# Patient Record
Sex: Male | Born: 1935 | Race: White | Hispanic: No | Smoking: Former smoker
Health system: Southern US, Community
[De-identification: ages and names within clinical notes are randomized; demographics above are authoritative.]

## PROBLEM LIST (undated history)

## (undated) DIAGNOSIS — I10 Essential (primary) hypertension: Secondary | ICD-10-CM

## (undated) DIAGNOSIS — I2510 Atherosclerotic heart disease of native coronary artery without angina pectoris: Secondary | ICD-10-CM

## (undated) DIAGNOSIS — I779 Disorder of arteries and arterioles, unspecified: Secondary | ICD-10-CM

## (undated) DIAGNOSIS — F1010 Alcohol abuse, uncomplicated: Secondary | ICD-10-CM

## (undated) DIAGNOSIS — I739 Peripheral vascular disease, unspecified: Secondary | ICD-10-CM

## (undated) DIAGNOSIS — K219 Gastro-esophageal reflux disease without esophagitis: Secondary | ICD-10-CM

## (undated) DIAGNOSIS — J189 Pneumonia, unspecified organism: Secondary | ICD-10-CM

## (undated) DIAGNOSIS — E785 Hyperlipidemia, unspecified: Secondary | ICD-10-CM

## (undated) DIAGNOSIS — J449 Chronic obstructive pulmonary disease, unspecified: Secondary | ICD-10-CM

## (undated) DIAGNOSIS — F1011 Alcohol abuse, in remission: Secondary | ICD-10-CM

## (undated) HISTORY — PX: FOOT SURGERY: SHX648

## (undated) HISTORY — DX: Gastro-esophageal reflux disease without esophagitis: K21.9

## (undated) HISTORY — PX: NASAL SINUS SURGERY: SHX719

## (undated) HISTORY — DX: Peripheral vascular disease, unspecified: I73.9

## (undated) HISTORY — DX: Disorder of arteries and arterioles, unspecified: I77.9

## (undated) HISTORY — DX: Alcohol abuse, uncomplicated: F10.10

## (undated) HISTORY — PX: COLONOSCOPY: SHX174

## (undated) HISTORY — DX: Alcohol abuse, in remission: F10.11

---

## 1997-05-19 ENCOUNTER — Ambulatory Visit (HOSPITAL_BASED_OUTPATIENT_CLINIC_OR_DEPARTMENT_OTHER): Admission: RE | Admit: 1997-05-19 | Discharge: 1997-05-19 | Payer: Self-pay | Admitting: Orthopaedic Surgery

## 1998-03-29 ENCOUNTER — Emergency Department (HOSPITAL_COMMUNITY): Admission: EM | Admit: 1998-03-29 | Discharge: 1998-03-29 | Payer: Self-pay | Admitting: Emergency Medicine

## 2000-03-21 ENCOUNTER — Ambulatory Visit (HOSPITAL_COMMUNITY): Admission: RE | Admit: 2000-03-21 | Discharge: 2000-03-21 | Payer: Self-pay | Admitting: Gastroenterology

## 2000-03-21 ENCOUNTER — Encounter: Payer: Self-pay | Admitting: Gastroenterology

## 2000-03-21 ENCOUNTER — Encounter (INDEPENDENT_AMBULATORY_CARE_PROVIDER_SITE_OTHER): Payer: Self-pay | Admitting: Specialist

## 2000-06-29 ENCOUNTER — Encounter: Payer: Self-pay | Admitting: Gastroenterology

## 2000-06-29 ENCOUNTER — Encounter: Admission: RE | Admit: 2000-06-29 | Discharge: 2000-06-29 | Payer: Self-pay | Admitting: Gastroenterology

## 2002-01-08 ENCOUNTER — Encounter: Payer: Self-pay | Admitting: Vascular Surgery

## 2002-01-10 ENCOUNTER — Ambulatory Visit (HOSPITAL_COMMUNITY): Admission: RE | Admit: 2002-01-10 | Discharge: 2002-01-10 | Payer: Self-pay | Admitting: Vascular Surgery

## 2002-01-24 ENCOUNTER — Inpatient Hospital Stay (HOSPITAL_COMMUNITY): Admission: RE | Admit: 2002-01-24 | Discharge: 2002-01-27 | Payer: Self-pay | Admitting: Vascular Surgery

## 2002-02-28 ENCOUNTER — Encounter: Payer: Self-pay | Admitting: Gastroenterology

## 2002-02-28 ENCOUNTER — Encounter: Admission: RE | Admit: 2002-02-28 | Discharge: 2002-02-28 | Payer: Self-pay | Admitting: Gastroenterology

## 2002-11-04 ENCOUNTER — Encounter: Payer: Self-pay | Admitting: Vascular Surgery

## 2002-11-05 ENCOUNTER — Ambulatory Visit (HOSPITAL_COMMUNITY): Admission: RE | Admit: 2002-11-05 | Discharge: 2002-11-05 | Payer: Self-pay | Admitting: Vascular Surgery

## 2002-11-05 HISTORY — PX: FEMORAL-POPLITEAL BYPASS GRAFT: SHX937

## 2003-11-26 ENCOUNTER — Encounter: Admission: RE | Admit: 2003-11-26 | Discharge: 2003-11-26 | Payer: Self-pay | Admitting: Otolaryngology

## 2006-04-16 ENCOUNTER — Encounter: Admission: RE | Admit: 2006-04-16 | Discharge: 2006-04-16 | Payer: Self-pay | Admitting: Otolaryngology

## 2006-04-19 ENCOUNTER — Ambulatory Visit (HOSPITAL_BASED_OUTPATIENT_CLINIC_OR_DEPARTMENT_OTHER): Admission: RE | Admit: 2006-04-19 | Discharge: 2006-04-19 | Payer: Self-pay | Admitting: Otolaryngology

## 2006-04-19 ENCOUNTER — Encounter (INDEPENDENT_AMBULATORY_CARE_PROVIDER_SITE_OTHER): Payer: Self-pay | Admitting: Specialist

## 2006-08-09 ENCOUNTER — Ambulatory Visit: Payer: Self-pay | Admitting: Vascular Surgery

## 2007-03-15 ENCOUNTER — Ambulatory Visit: Payer: Self-pay | Admitting: Vascular Surgery

## 2007-06-12 ENCOUNTER — Ambulatory Visit (HOSPITAL_BASED_OUTPATIENT_CLINIC_OR_DEPARTMENT_OTHER): Admission: RE | Admit: 2007-06-12 | Discharge: 2007-06-12 | Payer: Self-pay | Admitting: Orthopedic Surgery

## 2007-10-30 ENCOUNTER — Ambulatory Visit: Payer: Self-pay | Admitting: Vascular Surgery

## 2008-06-12 ENCOUNTER — Ambulatory Visit: Payer: Self-pay | Admitting: Vascular Surgery

## 2008-06-16 ENCOUNTER — Ambulatory Visit: Payer: Self-pay | Admitting: Surgery

## 2008-06-16 ENCOUNTER — Ambulatory Visit (HOSPITAL_COMMUNITY): Admission: RE | Admit: 2008-06-16 | Discharge: 2008-06-16 | Payer: Self-pay | Admitting: Surgery

## 2009-01-06 HISTORY — PX: NM MYOCAR PERF WALL MOTION: HXRAD629

## 2009-10-26 ENCOUNTER — Inpatient Hospital Stay (HOSPITAL_COMMUNITY): Admission: EM | Admit: 2009-10-26 | Discharge: 2009-10-27 | Payer: Self-pay | Admitting: Emergency Medicine

## 2010-01-06 HISTORY — PX: OTHER SURGICAL HISTORY: SHX169

## 2010-03-29 ENCOUNTER — Ambulatory Visit
Admission: RE | Admit: 2010-03-29 | Discharge: 2010-03-29 | Disposition: A | Discharge status: Home / Self Care | Payer: Medicare Other | Source: Ambulatory Visit | Attending: Allergy and Immunology | Admitting: Allergy and Immunology

## 2010-03-29 ENCOUNTER — Other Ambulatory Visit: Payer: Self-pay | Admitting: Allergy and Immunology

## 2010-03-29 DIAGNOSIS — R0609 Other forms of dyspnea: Secondary | ICD-10-CM

## 2010-04-21 LAB — CBC
HCT: 37.2 % — ABNORMAL LOW (ref 39.0–52.0)
HCT: 41.2 % (ref 39.0–52.0)
Hemoglobin: 12.4 g/dL — ABNORMAL LOW (ref 13.0–17.0)
MCH: 30.6 pg (ref 26.0–34.0)
MCHC: 33.3 g/dL (ref 30.0–36.0)
MCV: 90 fL (ref 78.0–100.0)
Platelets: 232 10*3/uL (ref 150–400)
RBC: 4.05 MIL/uL — ABNORMAL LOW (ref 4.22–5.81)
RBC: 4.58 MIL/uL (ref 4.22–5.81)
RDW: 13.6 % (ref 11.5–15.5)
RDW: 13.9 % (ref 11.5–15.5)
WBC: 11.1 10*3/uL — ABNORMAL HIGH (ref 4.0–10.5)

## 2010-04-21 LAB — BASIC METABOLIC PANEL
BUN: 14 mg/dL (ref 6–23)
Chloride: 98 mEq/L (ref 96–112)
GFR calc Af Amer: >60 mL/min (ref >60)
GFR calc non Af Amer: >60 mL/min (ref >60)
Potassium: 3.7 mEq/L (ref 3.5–5.1)
Sodium: 135 mEq/L (ref 135–145)

## 2010-05-17 LAB — POCT I-STAT, CHEM 8
Calcium, Ion: 1.05 mmol/L — ABNORMAL LOW (ref 1.12–1.32)
Chloride: 102 mEq/L (ref 96–112)
HCT: 42 % (ref 39.0–52.0)
Hemoglobin: 14.3 g/dL (ref 13.0–17.0)
TCO2: 25 mmol/L (ref 0–100)

## 2010-06-21 NOTE — Assessment & Plan Note (Signed)
OFFICE VISIT   Gary [name], Gary [name]  DOB:  01-13-1936                                       10/30/2007  UJWJX#:[id number]   The patient returns today for concern regarding swelling in his right  leg and ankle.  He continues to have pain in both feet.  He reports this  is 24 hours a day and is not related to exercise.  He has had ankle  surgery twice and does have some edema in this area and also reports  some discomfort in his popliteal space.  He has a prior left femoral to  popliteal bypass.  A Doppler evaluation of this reveals patency of his  left fem-pop bypass with no evidence of technical difficulty.  He does  have known calcific tibial disease bilaterally and therefore ankle arm  indices are unreliable.   On physical examination he has a 2+ left popliteal pulse and absent  distal pulses.  His feet are warm and well-perfused bilaterally.  He  does have some mild swelling in his right ankle.  He does not have any  specific tenderness in his right calf.   He underwent right leg venous duplex which showed no evidence of deep  venous thrombosis.  I reassured him that I do not see any evidence of  arterial or venous pathology to explain his chronic bilateral lower  extremity pain.  I explained that he does not have any evidence of DVT  and therefore would not recommend any additional evaluation.  He will  continue to follow up with Dr. Larey Dresser regarding his primary  pathology.   Larina Earthly, M.D.  Electronically Signed   TFE/MEDQ  D:  10/30/2007  T:  10/31/2007  Job:  [id number]   cc:   Ezequiel Kayser. Ajlouny, D.P.M.  Richard A. Alanda Amass, M.D.

## 2010-06-21 NOTE — Procedures (Signed)
BYPASS GRAFT EVALUATION   INDICATION:  Followup lower extremity peripheral vascular disease.   HISTORY:  Diabetes:  No.  Cardiac:  No.  Hypertension:  Yes.  Smoking:  Quit.  Previous Surgery:  Left femoral-popliteal artery bypass graft with  reverse saphenous vein on January 24, 2002.   SINGLE LEVEL ARTERIAL EXAM                               RIGHT              LEFT  Brachial:  Anterior tibial:  Posterior tibial:  Peroneal:  Ankle/brachial index:        Not obtained due to known medial  calcification                Not obtained due to known medial  calcification   PREVIOUS ABI:  Date:  RIGHT:  LEFT:   LOWER EXTREMITY BYPASS GRAFT DUPLEX EXAM:   DUPLEX:  Doppler arterial waveforms appear biphasic proximal to, within  and distal to bypass graft with elevated velocities in the proximal  anastomosis.  As previously noted there are abnormal Doppler signals at  a small pouch which represents a ligated branch of the vein in the  proximal graft.   IMPRESSION:  1. Patent left femoral-popliteal artery bypass graft with elevated      velocities of 334 cm/s in the proximal anastomosis, an increase      from previous study, however correlates with the study before that.  2. ABIs not obtained due to previously documented medial      calcification.   ___________________________________________  Larina Earthly, M.D.   AS/MEDQ  D:  03/15/2007  T:  03/17/2007  Job:  [id number]

## 2010-06-21 NOTE — Assessment & Plan Note (Signed)
OFFICE VISIT   [name], [name]  DOB:  12/02/1935                                       06/12/2008  EAVWU#:[id number]   Gary [name] presents today for continued followup of his left fem-  pop bypass.  This was placed by me in December 2003 for lower extremity  arterial insufficiency and claudication.  [name] has had continued  difficulty with both feet.  He has had multiple surgery on his right  ankle which he reports has helped him none.  This clearly is separate  from any arterial insufficiency, since his pain is 24 hours a day.  He  does have a patent left fem-pop bypass, as he has had serial vascular  lab studies over the course of many years.  He does have tibial vessel  disease with calcification.  Therefore ankle arm indices are not  reliable.  His vascular lab studies have shown progression of the  stenosis in the proximal portion of his femoral to popliteal bypass on  the left.  On physical exam, his feet are well-perfused bilaterally.  He  has 2+ femoral, 2-3+ popliteal pulse on the left.  Today, his velocities  are over 2 centimeters per second near the arterial anastomosis.  This  has been a continual progression.  I discussed this at length with Mr.  Asebedo.  I explained that I do not feel that this is causing any of his  symptoms since his symptoms are constant foot pain and not claudication-  type symptoms.  I did however explain that I feel that this does put his  fem-pop bypass at increased risk with this degree of stenosis.  I have  recommend arteriography for further evaluation and discussed the  treatment options of continued observation versus angioplasty versus  surgical revision depending on the study.  He understands, and we will  proceed with outpatient arteriography with Dr. Myra Gianotti on Jun 16, 2008  at [facility].   Larina Earthly, M.D.  Electronically Signed   TFE/MEDQ  D:  06/12/2008  T:  06/15/2008  Job:   [id number]   cc:   Gerlene Burdock A. Alanda Amass, M.D.

## 2010-06-21 NOTE — Op Note (Signed)
NAME:  Gary [name], Gary [name] NO.:  [id number]   MEDICAL RECORD NO.:  [id number]          PATIENT TYPE:  AMB   LOCATION:  DSC                          FACILITY:  [facility]   PHYSICIAN:  Matthew A. Weingold, M.D.DATE OF BIRTH:  1935/12/24   DATE OF PROCEDURE:  DATE OF DISCHARGE:                               OPERATIVE REPORT   PREOPERATIVE DIAGNOSIS:  Right hand index, long ring, and small finger  stenosing tenosynovitis.   PREOPERATIVE DIAGNOSIS:  Right hand index, long ring, and small finger  stenosing tenosynovitis.   PROCEDURE:  Release of right long and right ring A1 pulleys and  injection in the right index and right small A1 pulleys with Celestone 1  mL each A1 pulley.   SURGEON:  Artist Pais. Mina Marble, MD   ASSISTANT:  None.   ANESTHESIA:  Regional.   TOURNIQUET TIME:  26 minutes.   COMPLICATIONS:  None.   DRAINS:  None.   OPERATIVE REPORT:  The patient was taken to the operating suite.  After  induction of adequate regional anesthetic, right upper extremity was  prepped and draped in sterile fashion.  An Esmarch was used.  After  adequate regional anesthetic was obtained, again it was prepped and  draped in sterile fashion.  At this point in time, the A1 pulley area of  the index and small finger were injected with 1 mL Celestone in the each  A1 pulley.  This was followed by a transverse incision was made over the  long and ring finger of A1 pulleys.  Dissection was carried down through  skin, subcutaneous tissues.  Neurovascular bundle was carefully  identified and retracted.  The proximal edge of the A1 pulley was  identified and split.  The FDS and FDP tendons were lysed.  All  adhesions and wound was irrigated and this was closed with 4-0 Vicryl  Rapide sutures.  Xeroform, 4x4s, and compressive wrap was applied.  The  patient tolerated the procedures well and to recovery room in stable  fashion.      Artist Pais Mina Marble, M.D.  Electronically  Signed     MAW/MEDQ  D:  06/12/2007  T:  06/13/2007  Job:  [id number]

## 2010-06-21 NOTE — Procedures (Signed)
BYPASS GRAFT EVALUATION   INDICATION:  Follow-up evaluation of lower extremity bypass graft.   HISTORY:  Diabetes:  No.  Cardiac:  No.  Hypertension:  Yes.  Smoking:  Quit.  Previous Surgery:  Left fem-pop bypass graft with reverse saphenous vein  on 01/24/02.   SINGLE LEVEL ARTERIAL EXAM                               RIGHT              LEFT  Brachial:                    191                200  Anterior tibial:             193                177  Posterior tibial:            Noncompressible    Noncompressible  Peroneal:  Ankle/brachial index:        0.97               0.89   PREVIOUS ABI:  Date:  RIGHT:  LEFT:   LOWER EXTREMITY BYPASS GRAFT DUPLEX EXAM:   DUPLEX:  1. Increased velocity of 442 cm/s noted at the proximal anastomosis      and 219 cm/s at the inflow artery.  2. Biphasic duplex waveforms noted within the graft in the native      arteries.   IMPRESSION:  1. Normal right lower extremity ankle brachial indices with biphasic      Doppler waveforms.  2. Left lower extremity ankle brachial indices suggest mild arterial      disease with biphasic duplex waveforms.   ___________________________________________  Larina Earthly, M.D.   AC/MEDQ  D:  06/12/2008  T:  06/12/2008  Job:  [id number]

## 2010-06-21 NOTE — Procedures (Signed)
DUPLEX DEEP VENOUS EXAM - LOWER EXTREMITY   INDICATION:  Persistent right foot swelling and pain.   HISTORY:  Edema:  Right ankle and foot swelling.  Trauma/Surgery:  Left fem-pop bypass graft in 2003.  Pain:  Both feet and occasional sharp pain behind his right knee.  PE:  No.  Previous DVT:  No.  Anticoagulants:  Other:   DUPLEX EXAM:                CFV   SFV   PopV  PTV    GSV                R  L  R  L  R  L  R   L  R  L  Thrombosis    o  o  o     o     o      o  Spontaneous   +  +  +     +     +      +  Phasic        +  +  +     +     +      +  Augmentation  +  +  +     +     +      +  Compressible  +  +  +     +     +      +  Competent   Legend:  + - yes  o - no  p - partial  D - decreased   IMPRESSION:  No evidence of deep vein thrombosis noted in the right  lower extremity.    _____________________________  Larina Earthly, M.D.   CH/MEDQ  D:  10/30/2007  T:  10/30/2007  Job:  [id number]

## 2010-06-21 NOTE — Procedures (Signed)
BYPASS GRAFT EVALUATION   INDICATION:  Follow up lower extremity peripheral vascular disease.   HISTORY:  Diabetes:  No  Cardiac:  No  Hypertension:  Yes  Smoking:  Quit  Previous Surgery:  Left fem-pop bypass with reverse saphenous vein on  January 24, 2002   SINGLE LEVEL ARTERIAL EXAM                               RIGHT              LEFT  Brachial:  Anterior tibial:  Posterior tibial:  Peroneal:  Ankle/brachial index:        Not obtained due to known medial  calcification.               Not obtained due to known medial  calcification.   PREVIOUS ABI:  Date:  RIGHT:  LEFT:   LOWER EXTREMITY BYPASS GRAFT DUPLEX EXAM:   DUPLEX:  Arterial Doppler wave forms are biphasic proximal to within and  distal to bypass graft with elevated velocities in the left CFA and  proximal anastomosis.  Elevated velocities appear fairly stable with  previous studies.  As previously noted, there are abnormal Doppler  signals at a small pouch which represents a ligated branch of the vein  in the proximal graft.   See attached sheet for velocities.   IMPRESSION:  1. Patent left femoral to popliteal bypass graft with elevated      velocities proximally.  2. No significant changes from previous studies.   ___________________________________________  Larina Earthly, M.D.   AS/MEDQ  D:  08/09/2006  T:  08/09/2006  Job:  [id number]

## 2010-06-21 NOTE — Assessment & Plan Note (Signed)
OFFICE VISIT   Gary [name], Gary [name]  DOB:  10-Feb-1935                                       03/15/2007  ZOXWR#:[id number]   The patient presents today for continued followup of his lower extremity  arterial insufficiency.  He is status post left femoral to below knee  popliteal bypass in 2003.  He continues to have discomfort in both lower  extremities.  He reports burning sensation in both feet, which is  chronic.  He did report that 1 month ago, he had progressive pain in his  right foot.  He reports that this felt similar to what he had prior to  his prior bypass.  This resolved over several weeks.  He currently does  have discomfort with prolonged walking, but it does appear to be mainly  neurogenic, seeming more like a peripheral neuropathy than claudication.  He reports occasional nitroglycerin use for chest pain.   PHYSICAL EXAM:  Well-developed, well-nourished white male appearing  stated age of 43.  His blood pressure is 166/66, pulse 59, respirations  18, oxygen saturation 99% on room air.  His radial pulses are 2+.  He  has 2+ femoral pulses bilaterally.  He does have an easily palpable  popliteal graft pulse on the left and no palpable pulses on the right.  I do not palpate pedal pulses on the left or right.   He underwent noninvasive vascular laboratory studies in our office  today, and this revealed widely patent left fem-pop bypass.  He does  have severe calcified tibial disease, making his ankle/arm index unable  to be obtained.  I discussed this with the patient.  I did not see any  evidence of limb-threatening ischemia.  Currently, he does not appear to  be limited by claudication, and has more limitation due to neurogenic-  type pain.  He will continue his walking program as much as possible,  and return to see [place] in the vascular lab for protocol followup.   Larina Earthly, M.D.  Electronically Signed   TFE/MEDQ  D:  03/15/2007  T:   03/18/2007  Job:  [id number]   cc:   Richard A. Alanda Amass, M.D.

## 2010-06-21 NOTE — Procedures (Signed)
BYPASS GRAFT EVALUATION   INDICATION:  Follow up left lower extremity bypass graft.   HISTORY:  Diabetes:  No.  Cardiac:  No.  Hypertension:  Yes.  Smoking:  Quit.  Previous Surgery:  Right fem-pop bypass graft on 01/24/02.   SINGLE LEVEL ARTERIAL EXAM                               RIGHT              LEFT  Brachial:  Anterior tibial:  Posterior tibial:  Peroneal:  Ankle/brachial index:        Not done           Not done   PREVIOUS ABI:  Date:  RIGHT:  LEFT:   LOWER EXTREMITY BYPASS GRAFT DUPLEX EXAM:   DUPLEX:  Biphasic Doppler waveforms noted throughout the left lower  extremity bypass graft and the native vessels with increased velocity of  353 cm/s noted at the proximal anastomosis.  As previously recorded,  there is an abnormal Doppler signal at the level of a small pouch,  which represents a ligated branch of the vein used for  the proximal  graft.   IMPRESSION:  1. Patent left femoropopliteal bypass graft with an increased velocity      and abnormal waveforms noted, as described above.  2. Bilateral ankle brachial indices were not performed due to known      medical calcification.  3. No significant change noted when compared to the previous      examination on 03/15/07.   ___________________________________________  Larina Earthly, M.D.   CH/MEDQ  D:  10/30/2007  T:  10/30/2007  Job:  [id number]

## 2010-06-21 NOTE — Op Note (Signed)
NAME:  Gary [name], Gary [name] NO.:  [id number]   MEDICAL RECORD NO.:  [id number]          PATIENT TYPE:  AMB   LOCATION:  SDS                          FACILITY:  [facility]   PHYSICIAN:  VDurene Cal IV, MDDATE OF BIRTH:  05-Nov-1935   DATE OF PROCEDURE:  06/16/2008  DATE OF DISCHARGE:                               OPERATIVE REPORT   PREOPERATIVE DIAGNOSIS:  Left femoral-popliteal bypass graft stenosis.   POSTOPERATIVE DIAGNOSIS:  Left femoral-popliteal bypass graft stenosis.   PROCEDURES PERFORMED:  1. Ultrasound access right femoral artery.  2. Abdominal aortogram.  3. Third-order catheterization.  4. Bilateral lower extremity runoff.   INDICATIONS:  This is a 75 year old gentleman who is status post left  femoral-popliteal bypass graft with vein in 2003.  He has developed  progressive  increase in velocities of proximal anastomosis.  He comes  in today for evaluation and possible intervention.   PROCEDURE:  The patient was identified in the holding area and taken to  room 8, he was placed supine on the table.  Right groin was prepped and  draped in a standard sterile fashion.  A time-out was called.  Right  femoral artery was evaluated with ultrasound, found to be widely patent.  Lidocaine 1% was used for local anesthesia.  The right femoral artery  was accessed under ultrasound guidance with an 18-gauge needle.  Benson  wire was advanced into the aorta under fluoroscopic visualization.  A 5-  French sheath was placed.  Omni flush catheter was placed at L1,  abdominal aortogram was obtained.  Catheter was pulled down to the  aortic bifurcation.  Pelvic angiogram was obtained.  Aortic bifurcation  was crossed with an Omni flush catheter.  A Glidewire and a 4-French  hand-held catheter and left leg runoff was obtained.  A Glidewire and  the hand-held catheter were used to gain access into the left femoral-  popliteal bypass graft and pullback pressures were obtained.   Next,  contrast injections were performed through the sheath for right leg  runoff.   FINDINGS:  Aortogram:  The visualized portions of the suprarenal  abdominal aorta are widely patent.  There are single renal arteries  bilaterally, which are widely patent.  The infrarenal abdominal aorta is  without significant disease.   Pelvic angiogram:  Bilateral common iliac arteries are patent.  There is  area of luminal narrowing in the distal right common iliac artery.  Right external iliac artery is patent without significant disease.  Right hypogastric artery is patent.  The left common iliac artery is  patent throughout its course.  The left external iliac artery is widely  patent.  The left hypogastric artery is widely patent.   Left lower extremity:  The left femoral artery is widely patent.  Bypass  graft is seen originating from the distal common femoral artery.  There  is luminal narrowing in the proximal portion of the bypass graft  approximately 40%.  This is not hemodynamically significant.  Pullback  pressures were performed across the anastomosis, which did not reveal a  pressure gradient change.  The bypass graft distal  anastomosis is into  the distal below-knee popliteal artery at the takeoff of an anterior  tibial artery.  The patient has 3-vessel runoff which are diffusely  diseased.   Right lower extremity:  The right common femoral artery was widely  patent.  There is diffuse disease throughout the right superficial  femoral artery with multiple areas of stenosis.  The above-knee  popliteal artery is also diffusely diseased.  The popliteal artery is  patent throughout its course.  He has 3-vessel runoff, although the  tibial vessels are diffusely diseased.  The posterior tibial artery has  multiple areas of high-grade stenosis.   After the above images were obtained, decision was made to terminate the  procedure.  Catheters and wires were removed.  The patient was  taken to  the holding area for sheath pull.   IMPRESSION:  1. Stenosis in the distal right common iliac artery.  2. Widely patent left femoral below-knee popliteal bypass graft.  The      luminal narrowing in the proximal anastomosis was not felt to be      hemodynamically significant based on multiple oblique contrast      injections as well as pullback pressures.      Jorge Ny, MD  Electronically Signed     VWB/MEDQ  D:  06/16/2008  T:  06/17/2008  Job:  [id number]

## 2010-06-24 NOTE — Discharge Summary (Signed)
   NAME:  Gary [name], Gary [name]                       ACCOUNT NO.:  [id number]   MEDICAL RECORD NO.:  [id number]                   PATIENT TYPE:  INP   LOCATION:  2024                                 FACILITY:  [facility]   PHYSICIAN:  Larina Earthly, M.D.                 DATE OF BIRTH:  24-Sep-1935   DATE OF ADMISSION:  01/24/2002  DATE OF DISCHARGE:  01/27/2002                                 DISCHARGE SUMMARY   ADMISSION DIAGNOSIS:  Peripheral vascular disease.   DISCHARGE DIAGNOSIS:  Peripheral vascular disease.   HOSPITAL COURSE:  The patient was admitted to [facility]  on January 24, 2002 after being seen and evaluated by Dr. Arbie Cookey as an  outpatient secondary to peripheral vascular disease.  On the date of  admission Dr. Arbie Cookey performed a left femoral popliteal bypass graft.  No  complications were noted during the procedure.  Postoperatively the  patient's vascular graft remained patent throughout the entire postoperative  course.  His hemoglobin and hematocrit remained stable at 11.6 and 33.7.  The patient began ambulating well and was subsequently deemed stable for  discharge home on postoperative day #3, January 27, 2002.   DISCHARGE MEDICATIONS:  1. Lipitor 80 mg daily.  2. Nexium 40 mg daily.  3. Avandia 12.5 mg daily.  4. Zetia 10 mg daily.  5. Neurontin 300 mg two tablets daily.  6. Tylox one to two tablets q.4-6h. PRN pain.   ACTIVITY:  The patient was told no driving, strenuous activity, lifting of  heavy objects.  The patient is told to walk daily, continue his breathing  exercises.   DIET:  Low fat, low salt.   WOUND CARE:  Patient was told he could shower and clean his incision with  soap and water.  He also will be called to notify him of his appointment for  stable removal.   DISPOSITION:  Home.   FOLLOW UP:  The patient will see Dr. Arbie Cookey at the [facility] office in  approximately two weeks in follow up. He also will call to verify the time  and  date of this appointment.     Levin Erp. Steward, P.A.                      Larina Earthly, M.D.    BGS/MEDQ  D:  01/27/2002  T:  01/27/2002  Job:  [id number]

## 2010-06-24 NOTE — H&P (Signed)
NAME:  TEEGAN, BRANDIS NO.:  [id number]   MEDICAL RECORD NO.:  [id number]          PATIENT TYPE:  AMB   LOCATION:  DSC                          FACILITY:  [facility]   PHYSICIAN:  Hermelinda Medicus, M.D.   DATE OF BIRTH:  1935-03-09   DATE OF ADMISSION:  04/19/2006  DATE OF DISCHARGE:                              HISTORY & PHYSICAL   This patient is a 75 year old male who has had a tympanic membrane  perforation for several years.  He has put this off because he has had  other issues both pulmonary and heart issues and peripheral vascular  issues but these have now been cleared and he now enters for a left  tympanoplasty in an effort to gain or regain his hearing at a little  better level, where, at this point his hearing is down in that left ear  he has 55 dB speech reception threshold, some of which is sensory neural  in the upper frequencies but in the lower frequencies is definitely  conductive.  He has sensory neural status is quite stable in the lower  frequencies.  He has an 83 and 82% discrimination score.   PAST HISTORY:  1. Is that of a femoral popliteal bypass in 2004.  2. Peripheral vascular a surgery in 2003.  3. Sinus surgery and T&A in the past.  4. Bilateral inguinal hernia repairs in the past.  5. Colonoscopy in 2002.  6. Left and right foot surgery also.  Dr. Alanda Amass takes care of him      now.  7. Hypertension.  8. Peripheral vascular disease and has been cleared by Dr. Alanda Amass      just this last month in February.   His further past history is that of reflux esophagitis, nocturia .  Otherwise, he is in excellent condition.   His cardiac workup is completely here in the chart showing a lipid  panel, which shows a cholesterol of 213.   MEDICATIONS:  1. Aspirin 81.  2. Caduet 10 mg 80 mg daily.  3. Diovan 25/160 p.o. daily.  4. Metoprolol tartrate 50 1/2 tablet b.i.d.  5. Prilosec 20 mg per day.  6. Zetia 10 mg per day.   He does  definitely have hypertensive issues and this has been completely  evaluated. His blood pressure was 125/75, 140/70 has a 2/6 systolic  murmur.  His post rest ejection fraction is 87%.  His ED volume is 76 mL  and he has been cleared for surgery by Dr. Franchot Heidelberg.   PHYSICAL EXAMINATION:  GENERAL:  Reveals a blood pressure 181/74 with a  pulse of 58.  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 His left ear has  perforation that involves the malleus and the anterior canal wall by the  annulus and, however, is free of cholesteatoma.  His middle ear looks to  be quite excellent after prepping and cleansing  in the office.  His  oral  cavity is clear.  His neck is free of any thyromegaly, cervical  adenopathy or mass.  His larynx is clear, true cords, false cords,  epiglottis, base of tongue are clear,  true cord mobility, gag reflex,  good EOMs, facial nerve are all symmetrical.  CHEST:  Free of rales, rhonchi or wheezes.  No __________, but he does  have a murmur 2/6 as above described.  Chest x-ray shows no acute  findings.  EKG showed normal sinus rhythm.  ABDOMEN:  Unremarkable.  EXTREMITIES:  Unremarkable except the above surgeries noted.   INITIAL DIAGNOSIS:  1. Left tympanic membrane perforation.  2. History of elevated cholesterol.  3. History of blood pressure elevation.  4. History of sensory neural hearing loss.  5. Triglyceride elevation.  6. Arteriosclerosis.  7. Peripheral vascular disease.  8. Chest pain.  9. Hyperlipidemia.           ______________________________  Hermelinda Medicus, M.D.     JC/MEDQ  D:  04/19/2006  T:  04/20/2006  Job:  [id number]   cc:   Gerlene Burdock A. Alanda Amass, M.D.  [facility] [employer]

## 2010-06-24 NOTE — Op Note (Signed)
NAME:  Gary [name], Gary [name]                       ACCOUNT NO.:  [id number]   MEDICAL RECORD NO.:  [id number]                   PATIENT TYPE:  INP   LOCATION:  3305                                 FACILITY:  [facility]   PHYSICIAN:  Larina Earthly, M.D.                 DATE OF BIRTH:  04-09-1935   DATE OF PROCEDURE:  01/24/2002  DATE OF DISCHARGE:                                 OPERATIVE REPORT   PREOPERATIVE DIAGNOSES:  Left lower extremity arterial insufficiency and  claudication.   POSTOPERATIVE DIAGNOSES:  Left lower extremity arterial insufficiency and  claudication.   PROCEDURE:  Left femoral to below knee popliteal bypass with reverse  saphenous vein.   SURGEON:  Larina Earthly, M.D.   ASSISTANT:  Quita Skye. Hart Rochester, M.D., Eber Hong, P.A.-C.   ANESTHESIA:  General endotracheal.   COMPLICATIONS:  None.   DISPOSITION:  To recovery room stable.   PROCEDURE IN DETAIL:  The patient was taken to the operating room and placed  in a supine position where the area of the left groin and left leg were  prepped and draped in the usual sterile fashion.  An incision was made over  the femoral pulse and carried down to isolate the common, superficial,  femoral, and profundus femoris arteries.  The patient had a pulse at this  level with extensive calcification of the artery.  The saphenous vein was  identified at the saphenofemoral junction.  The tributary branches were  ligated with 3-0 and 4-0 silk ties and divided.  The patient had a normal-  caliber vein for approximately the first 4-5 cm, and then this split early  in the proximal thigh.  The patient had a duplicated system from the thigh  down to the below-knee position.  The larger of the two veins was chosen for  bypass.  The below-knee popliteal artery was exposed and was again was  extensively calcified but was of good caliber.  A tunnel was created from  the below-knee popliteal artery to the groin.  The saphenous vein  was  harvested from the level of the midcalf up to the groin.  The saphenofemoral  junction was occluded with a Cooley clamp, and the saphenous vein was  excised at the saphenofemoral junction, and this was oversewn with 5-0  Prolene suture.  The vein was gently dilated and was of small to moderate  size.  Due to the small size, the decision was made to reverse the vein  rather than attempt valve lysis.  The patient was given 7000 units of  intravenous heparin.  After adequate circulation time, the common  superficial, femoral and profunda femoris arteries were occluded.  The  common femoral artery was opened with an 11 blade and extended with Pott  scissors.  The saphenous vein was reversed, spatulated, and sewn end-to-side  to the artery with a running 6-0 Prolene suture.  The anastomosis  was tested  and found to be adequate.  Next, the vein was tunneled down to the level of  the below-knee popliteal artery.  A pneumatic tourniquet was placed at the  above-knee position.  The leg was elevated and exsanguinated with an Esmarch  tourniquet, and the pneumatic tourniquet was inflated to 250 mmHg.  The  below-knee popliteal artery was opened with a 11 blade and extended with  Pott scissors.  The vein was kept the appropriate length, was spatulated,  and sewn end-to-side to the artery with a running 6-0 Prolene suture.  Prior  to completion of the anastomosis, the tourniquet was deflated, and the usual  flushing maneuvers were undertaken.  The anastomosis was completed, and  graft-dependent Doppler flow was noted in the foot.  The patient was given  50 mg of Protamine to reverse heparin.  The wounds were irrigated with saline and hemostasis with electrocautery.  The wounds were closed with 2-0 and 3-0 Vicryl in the subcutaneous tissue.  The skin was closed with skin clips.  A sterile dressing was applied, and  the patient was taken to the recovery room in stable condition.                                                Larina Earthly, M.D.    TFE/MEDQ  D:  01/24/2002  T:  01/25/2002  Job:  [id number]

## 2010-06-24 NOTE — Op Note (Signed)
NAME:  Gary [name], Gary [name]                       ACCOUNT NO.:  [id number]   MEDICAL RECORD NO.:  [id number]                   PATIENT TYPE:  OIB   LOCATION:  2895                                 FACILITY:  [facility]   PHYSICIAN:  Quita Skye. Hart Rochester, M.D.               DATE OF BIRTH:  05/21/1935   DATE OF PROCEDURE:  11/05/2002  DATE OF DISCHARGE:                                 OPERATIVE REPORT   PREOPERATIVE DIAGNOSIS:  Left femoral popliteal vein graft stenosis.   POSTOPERATIVE DIAGNOSIS:  Left femoral popliteal vein graft stenosis.   PROCEDURE:  1. Abdominal aortogram with bilateral lower extremity run off via right     common femoral approach.  2. Selective catheterization left common iliac artery with left femoral     angiogram.   SURGEON:  Quita Skye. Hart Rochester, M.D.   ANESTHESIA:  Local Xylocaine.   COMPLICATIONS:  None.   DESCRIPTION OF PROCEDURE:  The patient was taken to the [facility]  peripheral endovascular lab and placed in supine position at which time both  groins were prepped with Betadine scrubbing solution and draped in routine  sterile manner.  After infiltration with 1% Xylocaine, the right common  femoral artery was entered percutaneously, a guide-wire passed into the  suprarenal aorta under fluoroscopic guidance.  A 5 French sheath and dilator  were passed over the guide-wire, dilator removed, and standard pigtail  positioned in the suprarenal aorta.  Flush abdominal aortogram was performed  injecting 20 mL of contrast at 20 mL per second.  This revealed the aorta to  be widely patent with single renal arteries bilaterally with no stenosis.  There was minimal atherosclerotic changes in the infrarenal aorta with no  stenosis and a small inferior mesenteric artery was patent.  Both common,  internal, and external iliac arteries were widely patent.  The catheter was  withdrawn into the terminal aorta and bilateral lower extremity run off  performed injecting 88 mL of  contrast at 8 mL per second.  This revealed the  right iliac system and right common femoral artery to be widely patent.  There was mild disease in the right superficial femoral artery with no  significant stenosis and the right popliteal artery was patent across the  knee joint.  There was three vessel run off on the right with the best  vessel being the anterior tibial and the peroneal, the posterior tibial  being diffusely diseased and all three vessels being diseased distally.  On  the left side, there was a widely patent iliofemoral system and a left  femoral popliteal vein graft which had a proximal stenosis over the proximal  2-3 cm  which was better visualized using the lateral and oblique views  performed later.  The left profunda femoris artery was widely patent.  The  vein graft was anastomosed to the below knee popliteal artery as there was a  chronic superficial femoral occlusion.  There was three vessel run off on  the left with the best vessel being the posterior tibial and the peroneal  arteries, the anterior tibial artery being the smallest and all being  distally diseased.  The pigtail catheters were removed over the guide-wire  and an IMA catheter used to selectively catheterize the left common iliac  artery and additional views of the left femoral popliteal vein graft at its  origin were obtained injecting 10 mL of contrast at 5 mL per second using  both RAO, LAO and lateral views.  This better defined the moderate stenosis  which appeared to be approximately 70-75% at the origin of the left femoral  popliteal vein graft.  The IMA catheter was then removed over the guide-  wire, the sheath removed, adequate compression applied, no complications  ensued.    FINDINGS:  1. Widely patent aortoiliac system.  2. Patent left femoral popliteal vein graft (below the knee) with proximal     vein graft stenosis approximating 75%.  3. Three vessel run off on the left with  diffuse disease distally.  4. Mild superficial femoral occlusive disease on the right with three vessel     run off with diseased tibial vessels.                                               Quita Skye Hart Rochester, M.D.    JDL/MEDQ  D:  11/05/2002  T:  11/05/2002  Job:  [id number]

## 2010-06-24 NOTE — H&P (Signed)
NAME:  Gary [name], Gary [name]                       ACCOUNT NO.:  [id number]   MEDICAL RECORD NO.:  [id number]                   PATIENT TYPE:  INP   LOCATION:  NA                                   FACILITY:  [facility]   PHYSICIAN:  Larina Earthly, M.D.                 DATE OF BIRTH:  February 11, 1935   DATE OF ADMISSION:  01/24/2002  DATE OF DISCHARGE:                                HISTORY & PHYSICAL   CHIEF COMPLAINT:  Peripheral vascular disease.   HISTORY OF PRESENT ILLNESS:  This is a 75 year old male who is being seen by  Dr. Arbie Cookey secondary to lower extremity claudication.  The patient states he  gets pain in both legs with walking approximately one block, left greater  than right.  He denies any rest pain or night pain but does report  occasional paresthesias.  He denies any nonhealing wounds on his feet.   PAST MEDICAL HISTORY:  1. Peripheral vascular disease.  2. Hypertension.  3. GERD.  4. Hypercholesterolemia.   PAST SURGICAL HISTORY:  1. Sinus operation.  2. Bilateral inguinal herniorrhaphy.  3. Tonsillectomy.   ALLERGIES:  IV CONTRAST DYE.   MEDICATIONS:  1. Lipitor 80 mg p.o. q.d.  2. Nexium 40 mg p.o. b.i.d.  3. Diovan 12.5 mg p.o. q.d.  4. Zetia 10 mg p.o. q.d.   FAMILY HISTORY:  The patient's mother is deceased of kidney cancer.  His  father is deceased with cerebrovascular accident, and he has a brother who  died of a stroke and a myocardial infarction.  There is no family history of  diabetes mellitus or hypertension.   SOCIAL HISTORY:  The patient is single but lives with his son.  He drinks  approximately one to two alcoholic beverages a day.  He does have a history  of smoking two and one-half packs of cigarettes for 35 years but quit 9  years ago.   REVIEW OF SYSTEMS:  GENERAL:  The patient denies any fever, chills, night  sweats, or frequent illnesses.  HEAD:  The patient denies any head injury or  loss of consciousness.  EYES:  The patient denies any  glaucoma or cataracts.  EARS:  The patient denies any tinnitus, vertigo, hearing loss, or ear  infections.  NOSE:  The patient denies any epistaxis, rhinorrhea, sinusitis.  MOUTH: The patient denies any problem with dentition.  OROPHARYNX:  No sore  throat.  NECK: The patient denies any problems with range of motion in his  neck.  CARDIOVASCULAR:  The patient denies any myocardial infarction,  angina, or cardiac arrhythmias.  PULMONARY:  The patient denies any asthma,  bronchitis, emphysema, pneumonia.  GI:  The patient has GERD for which he  takes Nexium.  Denies nausea, vomiting, diarrhea, constipation,  hematochezia, melena.  GU: The patient denies any urinary tract infection or  incontinence.  ENDOCRINE:  The patient denies any diabetes mellitus or  thyroid disease.  MUSCULOSKELETAL:  The patient denies any arthritis,  arthralgias, or myalgias.  NEUROLOGIC:  The patient denies any history of  stroke or transient ischemic attack, memory loss, or depression.   PHYSICAL EXAMINATION:  VITAL SIGNS:  Blood pressure 128/64, pulse 84 and  regular, respirations 16.  GENERAL:  The patient is alert and oriented x 3 in no distress.  HEENT:  Head is atraumatic, normocephalic.  Eyes: Pupils are equal, round,  and reactive to light and accommodation.  Extraocular movements intact  without scleral icterus. Auditory acuity grossly intact.  Nose: Nares  patent.  Sinuses are nontender.  Mouth is without exudate.  NECK:  Supple without JVD or lymphadenopathy, carotid bruits or thyromegaly.  LUNGS:  Clear to auscultation bilaterally.  CARDIOVASCULAR:  Regular rate and rhythm without murmurs, rubs, or gallops.  ABDOMEN:  Soft, nontender, nondistended.  Positive bowel sounds in all four  quadrants.  EXTREMITIES:  No cyanosis, clubbing, or edema.  PULSES:  2+ carotid and femoral pulses bilaterally.  He had 1+ popliteal  pulse on the right, absent on the left.  He had absent pedal pulses  bilaterally.   NEUROLOGIC:  Cranial nerves II-XII grossly intact.  He had steady gait.  He  had 5+ and equal muscle strength in all four extremities.   IMPRESSION:  Left lower extremity ischemia.   PLAN:  Left femoral-popliteal bypass graft by Dr. Arbie Cookey.       Levin Erp. Steward, P.A.                      Larina Earthly, M.D.    BGS/MEDQ  D:  01/22/2002  T:  01/22/2002  Job:  [id number]

## 2010-06-24 NOTE — Procedures (Signed)
[place]  Patient:    Gary [name], Gary [name]                    MRN: [id number] Proc. Date: 03/21/00 Adm. Date:  [id number] Attending:  Nelda Marseille CC:         Darden Palmer., M.D.  Verl Dicker, M.D.   Procedure Report  PROCEDURE:  Esophagogastroduodenoscopy with biopsy and Savary dilation.  INDICATION:  Recurrent dysphagia, known hiatal hernia.  Consent was signed after risks, benefits, methods, and options thoroughly discussed multiple times in the past.  MEDICINES USED:  Demerol 100 mg, Versed 7.5 mg.  DESCRIPTION OF PROCEDURE:  The video endoscope was inserted by direct vision. The proximal and midesophagus was normal.  In the distal esophagus, a small hiatal hernia was seen with a widely patent ring.  No signs of Barretts or significant esophagus was seen.  Scope was inserted into the stomach, where some distal gastritis and moderate antritis were seen, and advanced through a normal pylorus into a normal duodenal bulb and around the C-loop to a normal second portion of the duodenum.  The scope was withdrawn back to the bulb, and a good look there ruled out abnormalities in all locations.  The scope was withdrawn back to the stomach and retroflexed.  High in the cardia, the hiatal hernia was confirmed.  The angularis, lesser and greater curve were normal, as well as the fundus, except for some distal gastritis mentioned above.  The scope was straightened, and straight visualization of the stomach confirmed above findings.  Scattered antral and distal gastric biopsies were obtained to confirm the gastritis, rule out other abnormalities, including Helicobacter. The scope was then slowly withdrawn back to 20 cm.  No additional esophageal findings were seen.  The scope was then readvanced back to the antrum.  The Savary wire was advanced.  The customary J-loop was confirmed under fluoroscopy.  The scope was removed,  making sure to keep the wire in the proper location in the antrum, and once removed, the Savary 16 mm dilator was advanced into the stomach and confirmed in the proper position under fluoroscopy.  The dilator and the wire were removed in tandem.  There was minimal heme on the dilator.  No resistance in passing the dilator.  The scope was reinserted, which showed some proximal esophageal trauma.  No distal esophageal tears.  We went ahead and took some biopsies of the top of the hiatal hernia patch to rule out Barretts.  At this juncture, air was suctioned and scope removed.  The patient tolerated the procedure well.  There was no obvious immediate complication.  ENDOSCOPIC DIAGNOSES: 1. Small hiatal hernia with a widely patent ring. 2. Moderately significant antritis, gastritis, status post biopsy. 3. Otherwise normal EGD, status post distal esophageal biopsies after    dilation.  THERAPY:  Savary dilatation to 16 mm under fluoroscopy.  PLAN:  Await pathology.  Continue pump inhibitors.  Consider more aggressive dilatation in the future, and continue workup with a colonoscopy. DD:  03/21/00 TD:  03/22/00 Job: [id number]

## 2010-06-24 NOTE — Procedures (Signed)
[place]. [facility]  Patient:    Gary [name], Gary [name]                    MRN: [id number] Proc. Date: 03/21/00 Adm. Date:  [id number] Attending:  Nelda Marseille CC:         Darden Palmer., M.D.  Verl Dicker, M.D.   Procedure Report  PROCEDURE PERFORMED:  Colonoscopy with biopsy.  ENDOSCOPIST:  Petra Kuba, M.D.  INDICATIONS FOR PROCEDURE:  Patient with multiple gastrointestinal complaints due for colonic screening.  Consent was signed after risks, benefits, methods, and options were thoroughly discussed multiple times in the past.  No additional medicines were used since it followed the endoscopy.  DESCRIPTION OF PROCEDURE:  Rectal inspection was pertinent for external hemorrhoids, small.  Digital exam was negative.  The pediatric video colonoscope was inserted and easily advanced around the colon to the cecum. On insertion, no significant abnormalities were seen.  To advance to the cecum did not require any abdominal pressure or position changes.  The cecum was identified by the appendiceal orifice and the ileocecal valve.  In fact the scope was inserted a short ways into the terminal ileum which was normal except for one tiny erosion.  There was no other inflammation. Photodocumentation and scattered biopsies were obtained.  The scope was then slowly withdrawn.  The prep in the colon was adequate.  There was some liquid stool that required washing and suctioning.  On slow withdrawal through the colon, other than a minimal amount of hepatic flexure inflammation which was biopsied, no other abnormalities were seen except for tiny descending and rectal hyperplastic-appearing polyps which were cold biopsies as well.  Some random colon biopsies were obtained also to rule out some microscopic colitis. Once back in the rectum, the scope was retroflexed pertinent for some internal hemorrhoids.  No additional findings were seen.  The scope  was reinserted a short ways up the sigmoid.  Air was suctioned, scope removed.  The patient tolerated the procedure well.  There was no obvious immediate complication.  ENDOSCOPIC DIAGNOSIS: 1. Internal and external hemorrhoids. 2. One small patch of inflammation in the hepatic flexure and a terminal ileum    erosion, status post biopsy. 3. Tiny hyperplastic rectal and descending polyps, status post cold biopsy. 4. Otherwise within to the terminal ileum with additional random biopsies    throughout.  PLAN:  Await pathology to determine future colonic screening.  Follow-up p.r.n. or at three months to recheck symptoms and make sure no further work-up plans are needed. DD:  03/21/00 TD:  03/22/00 Job: [id number]

## 2010-06-24 NOTE — Op Note (Signed)
NAME:  Gary [name], Gary [name] NO.:  [id number]   MEDICAL RECORD NO.:  [id number]          PATIENT TYPE:  AMB   LOCATION:  DSC                          FACILITY:  [facility]   PHYSICIAN:  Hermelinda Medicus, M.D.   DATE OF BIRTH:  1935/07/06   DATE OF PROCEDURE:  04/19/2006  DATE OF DISCHARGE:                               OPERATIVE REPORT   PREOPERATIVE DIAGNOSIS:  Left tympanic membrane perforation.   POSTOPERATIVE DIAGNOSIS:  Left tympanic membrane perforation.   OPERATION:  Left tympanoplasty.   OPERATOR:  Hermelinda Medicus, M.D.   ANESTHESIA:  Local MAC with Dr. Gelene Mink.   The patient is aware of the risks and gains and is also aware of the  fact that he could have  considerable hearing loss continuing being he  has a sensorineural hearing loss.  He could have infection and he could  have facial nerve issues but we would expect this to work out very well.  We are going to this under local anesthesia, MAC and his cardiac  evaluation has also been completely done and has been cleared for this  procedure.   PROCEDURE:  The patient was placed in supine position and under local  anesthesia was prepped and draped with the usual Betadine in that left  ear and the usual otologic drape.  The four quadrants of the external  ear canal were injected with 1% Xylocaine with epinephrine using just  0.2 mL.  He did have blood pressure elevation so we also mixed this with  plain Xylocaine. Postauricularly we also used plain Xylocaine.  The ear  was examined and cleansed.  The edge of the tympanic membrane was then  prepared using the a sickle knife.  We trimmed the edge of the  perforation.  A picture was taken and once this edge was completely  cleared, the annulus was seen anteriorly.  The malleus could be seen and  palpated clearly posteriorly, but the tympanic membrane posterior was in  better condition and was kept intact.  Attention was then carried to the  post auricular region  where we made an incision and carried down to the  temporalis fascia.  All hemostasis was established with Bovie  electrocoagulation.  A temporalis fascia graft was taken.  All  hemostasis was established and the incision was closed with 2-0 chromic  catgut.  Attention was then carried back to the middle ear and tympanic  membrane where Gelfoam with Ciprodex was used and then the graft was  placed beneath the edges of the tympanic membrane.  The inferior edges  had also been rasped using the rasper to roughen the edges to make sure  there would accept this graft well.  The graft fit very nicely, was  beneath all the edges of the tympanic membrane perforation and also in  contact with the malleus and the annulus anteriorly and then the Gelfoam  with Ciprodex was placed lateral to this followed by Neosporin ointment  followed by  cotton in the external ear canal.  Just a band-aid was  placed postauricularly.  The patient tolerated the procedure well.  Facial  nerve intact.  The patient was not vertiginous, did well postop.  Blood pressure was 174/86.   His followup will be in five days, then in three weeks, six weeks and  two months and three months, then six months and a year.           ______________________________  Hermelinda Medicus, M.D.     JC/MEDQ  D:  04/19/2006  T:  04/20/2006  Job:  [id number]   cc:   [facility] A. Alanda Amass, M.D.

## 2010-10-13 ENCOUNTER — Ambulatory Visit (HOSPITAL_COMMUNITY)
Admission: RE | Admit: 2010-10-13 | Discharge: 2010-10-13 | Disposition: A | Discharge status: Home / Self Care | Payer: Medicare Other | Source: Ambulatory Visit | Attending: Cardiovascular Disease | Admitting: Cardiovascular Disease

## 2010-10-13 DIAGNOSIS — I70219 Atherosclerosis of native arteries of extremities with intermittent claudication, unspecified extremity: Secondary | ICD-10-CM | POA: Insufficient documentation

## 2010-10-13 HISTORY — PX: LOWER EXTREMITY ANGIOGRAM: SHX5955

## 2010-11-02 LAB — POCT I-STAT, CHEM 8
BUN: 22
Calcium, Ion: 1.3
Chloride: 102
Creatinine, Ser: 1
Glucose, Bld: 115 — ABNORMAL HIGH
HCT: 40
Hemoglobin: 13.6
Potassium: 4.4
Sodium: 139
TCO2: 26

## 2010-11-06 NOTE — Procedures (Signed)
NAME:  Gary [name], Gary [name] NO.:  [id number]  MEDICAL RECORD NO.:  [id number]  LOCATION:                                 FACILITY:  PHYSICIAN:  Nanetta Batty, M.D.   DATE OF BIRTH:  March 30, 1935  DATE OF PROCEDURE: DATE OF DISCHARGE:                   PERIPHERAL VASCULAR INVASIVE PROCEDURE   Gary [name] is a 75 year old Caucasian male, patient of Alanda Amass, who was referred for abdominal aortography with bifemoral runoff to define his anatomy because of recurrent claudication.  He has history of left fem-pop bypass grafting by Dr. Tawanna Cooler Early remotely.  He has hypertension and hyperlipidemia as well.  Dopplers suggested high-grade disease in his SFAs and common femoral arteries. He presents now for angiography and potential intervention for lifestyle- limiting claudication.  PROCEDURE DESCRIPTION:  The patient was brought to second floor [place] PV angiographic suite in the postabsorptive state.  He was premedicated with p.o. Valium and received IV hydralazine at the end of the case for his elevated blood pressure.  His right groin was prepped and shaved in the usual sterile fashion.  1% Xylocaine was used for local anesthesia.  A 5-French sheath was inserted to the right femoral artery using standard Seldinger technique.  A 5-French pigtail catheter was used for abdominal aortography with bifemoral runoff using bolus chase digital subtraction step table technique.  Visipaque dye was used for entirety of the case.  Retrograde aortic pressures were monitored during the case.  ANGIOGRAPHIC RESULTS: 1. Abdominal aorta;     a.     Renal arteries - 40% left renal artery stenosis. 2. Left lower extremity;     a.     50%-60% calcific exophytic distal left common femoral artery      plaque just proximal to the proximal anastomosis with fem-pop      bypass graft which had 30%-40% ostial stenosis just past      anastomosis was somewhat hypodense.     b.     Patent  fem-pop bypass graft.     c.     Three-vessel runoff with diffusely diseased posterior      tibial. 3. Right lower extremity;     a.     40%-50% right external iliac artery stenosis without a      pullback gradient.     b.     70%-80% calcified segmental mid and distal right SFA      stenosis with three-vessel runoff and diffusely diseased posterior      tibial.  IMPRESSION:  Mr. Bamber has a patent left fem-pop bypass graft with moderate calcified proximal disease in the distal common femoral artery just proximal to the graft anastomosis with 30-mm gradient.  He does have moderate-to-moderately high-grade segmental mid and distal right SFA disease.  He has got two-three-vessel runoff with patent diffusely diseased posterior tibials bilaterally.  At this point, I think continued medical therapy would be warranted.  He may be a candidate for [facility] rotational atherectomy, PTA plus or minus stenting of his right SFA.  At this point, I think medical treatment of his left common femoral artery is warranted.  The patient received hydralazine for elevated systolic pressures in the lab.  His right groin was sealed with  a 5-French ExoSeal and pressures were held in the groin for 3-5 minutes, achieving hemostasis.  The patient left the lab in stable condition.  He will be gently hydrated and discharged home after 3 hours of lying recumbent.  He will follow up with Dr. Susa Griffins.     Nanetta Batty, M.D.     JB/MEDQ  D:  10/13/2010  T:  10/13/2010  Job:  [id number]  cc:   PV Angiographic Suite Second Floor [facility] A. Alanda Amass, M.D. [facility]  Electronically Signed by Nanetta Batty M.D. on 11/06/2010 11:44:29 AM

## 2011-04-27 ENCOUNTER — Other Ambulatory Visit: Payer: Self-pay

## 2011-04-27 ENCOUNTER — Encounter (HOSPITAL_COMMUNITY): Payer: Self-pay | Admitting: Emergency Medicine

## 2011-04-27 ENCOUNTER — Inpatient Hospital Stay (HOSPITAL_COMMUNITY)
Admission: EM | Admit: 2011-04-27 | Discharge: 2011-04-28 | DRG: 287 | Disposition: A | Discharge status: Home / Self Care | Payer: Medicare Other | Attending: Cardiovascular Disease | Admitting: Cardiovascular Disease

## 2011-04-27 ENCOUNTER — Encounter (HOSPITAL_COMMUNITY)
Admission: EM | Disposition: A | Discharge status: Home / Self Care | Payer: Self-pay | Source: Home / Self Care | Attending: Cardiovascular Disease

## 2011-04-27 DIAGNOSIS — R079 Chest pain, unspecified: Secondary | ICD-10-CM | POA: Diagnosis not present

## 2011-04-27 DIAGNOSIS — I739 Peripheral vascular disease, unspecified: Secondary | ICD-10-CM

## 2011-04-27 DIAGNOSIS — I2119 ST elevation (STEMI) myocardial infarction involving other coronary artery of inferior wall: Secondary | ICD-10-CM | POA: Diagnosis not present

## 2011-04-27 DIAGNOSIS — J449 Chronic obstructive pulmonary disease, unspecified: Secondary | ICD-10-CM | POA: Diagnosis present

## 2011-04-27 DIAGNOSIS — E785 Hyperlipidemia, unspecified: Secondary | ICD-10-CM | POA: Diagnosis present

## 2011-04-27 DIAGNOSIS — I2510 Atherosclerotic heart disease of native coronary artery without angina pectoris: Secondary | ICD-10-CM

## 2011-04-27 DIAGNOSIS — K219 Gastro-esophageal reflux disease without esophagitis: Secondary | ICD-10-CM | POA: Diagnosis present

## 2011-04-27 DIAGNOSIS — I10 Essential (primary) hypertension: Secondary | ICD-10-CM | POA: Diagnosis present

## 2011-04-27 DIAGNOSIS — R0789 Other chest pain: Principal | ICD-10-CM | POA: Diagnosis present

## 2011-04-27 DIAGNOSIS — I213 ST elevation (STEMI) myocardial infarction of unspecified site: Secondary | ICD-10-CM | POA: Diagnosis present

## 2011-04-27 DIAGNOSIS — J4489 Other specified chronic obstructive pulmonary disease: Secondary | ICD-10-CM | POA: Diagnosis present

## 2011-04-27 DIAGNOSIS — F1010 Alcohol abuse, uncomplicated: Secondary | ICD-10-CM | POA: Diagnosis present

## 2011-04-27 HISTORY — DX: Essential (primary) hypertension: I10

## 2011-04-27 HISTORY — DX: Atherosclerotic heart disease of native coronary artery without angina pectoris: I25.10

## 2011-04-27 HISTORY — DX: Peripheral vascular disease, unspecified: I73.9

## 2011-04-27 HISTORY — PX: LEFT HEART CATHETERIZATION WITH CORONARY ANGIOGRAM: SHX5451

## 2011-04-27 HISTORY — DX: Hyperlipidemia, unspecified: E78.5

## 2011-04-27 HISTORY — DX: Chronic obstructive pulmonary disease, unspecified: J44.9

## 2011-04-27 HISTORY — PX: CARDIAC CATHETERIZATION: SHX172

## 2011-04-27 LAB — CBC
HCT: 47.5 % (ref 39.0–52.0)
Hemoglobin: 16.6 g/dL (ref 13.0–17.0)
MCH: 31 pg (ref 26.0–34.0)
MCHC: 34.9 g/dL (ref 30.0–36.0)
RBC: 5.35 MIL/uL (ref 4.22–5.81)

## 2011-04-27 LAB — DIFFERENTIAL
Basophils Relative: 0 % (ref 0–1)
Eosinophils Absolute: 0.2 10*3/uL (ref 0.0–0.7)
Lymphs Abs: 1.9 10*3/uL (ref 0.7–4.0)
Monocytes Absolute: 1 10*3/uL (ref 0.1–1.0)
Monocytes Relative: 12 % (ref 3–12)

## 2011-04-27 LAB — POCT I-STAT, CHEM 8
BUN: 14 mg/dL (ref 6–23)
Calcium, Ion: 1.13 mmol/L (ref 1.12–1.32)
Chloride: 102 mEq/L (ref 96–112)
Creatinine, Ser: 0.8 mg/dL (ref 0.50–1.35)
Sodium: 137 mEq/L (ref 135–145)
TCO2: 25 mmol/L (ref 0–100)

## 2011-04-27 LAB — COMPREHENSIVE METABOLIC PANEL
ALT: 16 U/L (ref 0–53)
AST: 17 U/L (ref 0–37)
Albumin: 3.3 g/dL — ABNORMAL LOW (ref 3.5–5.2)
Calcium: 9.5 mg/dL (ref 8.4–10.5)
Sodium: 136 mEq/L (ref 135–145)
Total Protein: 6.7 g/dL (ref 6.0–8.3)

## 2011-04-27 LAB — TROPONIN I: Troponin I: <0.3 ng/mL (ref <0.30)

## 2011-04-27 SURGERY — LEFT HEART CATHETERIZATION WITH CORONARY ANGIOGRAM
Anesthesia: LOCAL

## 2011-04-27 MED ORDER — HYDRALAZINE HCL 20 MG/ML IJ SOLN
INTRAMUSCULAR | Status: AC
  Filled 2011-04-27: qty 1

## 2011-04-27 MED ORDER — HYDRALAZINE HCL 50 MG PO TABS
50.0000 mg | ORAL_TABLET | Freq: Two times a day (BID) | ORAL | Status: DC
  Administered 2011-04-27 (×2): 50 mg via ORAL
  Filled 2011-04-27 (×4): qty 1

## 2011-04-27 MED ORDER — NITROGLYCERIN IN D5W 200-5 MCG/ML-% IV SOLN
2.0000 ug/min | INTRAVENOUS | Status: DC
  Administered 2011-04-27: 20 ug/min via INTRAVENOUS
  Administered 2011-04-27 (×2): 25 ug/min via INTRAVENOUS

## 2011-04-27 MED ORDER — HYDROCHLOROTHIAZIDE 25 MG PO TABS
25.0000 mg | ORAL_TABLET | Freq: Every day | ORAL | Status: DC
  Administered 2011-04-27 – 2011-04-28 (×2): 25 mg via ORAL
  Filled 2011-04-27 (×2): qty 1

## 2011-04-27 MED ORDER — FAMOTIDINE IN NACL 20-0.9 MG/50ML-% IV SOLN
20.0000 mg | Freq: Once | INTRAVENOUS | Status: AC
  Administered 2011-04-28: 20 mg via INTRAVENOUS
  Filled 2011-04-27: qty 50

## 2011-04-27 MED ORDER — LIDOCAINE HCL (PF) 1 % IJ SOLN
INTRAMUSCULAR | Status: AC
  Filled 2011-04-27: qty 30

## 2011-04-27 MED ORDER — ASPIRIN EC 325 MG PO TBEC
325.0000 mg | DELAYED_RELEASE_TABLET | Freq: Every day | ORAL | Status: DC
  Administered 2011-04-28: 325 mg via ORAL
  Filled 2011-04-27: qty 1

## 2011-04-27 MED ORDER — MORPHINE SULFATE 2 MG/ML IJ SOLN
1.0000 mg | INTRAMUSCULAR | Status: DC | PRN
  Administered 2011-04-27: 1 mg via INTRAVENOUS
  Filled 2011-04-27: qty 1

## 2011-04-27 MED ORDER — SODIUM CHLORIDE 0.9 % IV SOLN: INTRAVENOUS | Status: DC

## 2011-04-27 MED ORDER — NITROGLYCERIN 0.2 MG/ML ON CALL CATH LAB
INTRAVENOUS | Status: AC
  Filled 2011-04-27: qty 1

## 2011-04-27 MED ORDER — METOPROLOL SUCCINATE ER 25 MG PO TB24
25.0000 mg | ORAL_TABLET | Freq: Every day | ORAL | Status: DC
  Administered 2011-04-27 – 2011-04-28 (×2): 25 mg via ORAL
  Filled 2011-04-27 (×2): qty 1

## 2011-04-27 MED ORDER — METHYLPREDNISOLONE SODIUM SUCC 125 MG IJ SOLR
INTRAMUSCULAR | Status: AC
  Filled 2011-04-27: qty 2

## 2011-04-27 MED ORDER — MORPHINE SULFATE 2 MG/ML IJ SOLN
2.0000 mg | Freq: Once | INTRAMUSCULAR | Status: AC
  Administered 2011-04-27: 2 mg via INTRAVENOUS
  Filled 2011-04-27: qty 1

## 2011-04-27 MED ORDER — METHYLPREDNISOLONE SODIUM SUCC 125 MG IJ SOLR
60.0000 mg | Freq: Once | INTRAMUSCULAR | Status: AC
  Administered 2011-04-28: 60 mg via INTRAVENOUS
  Filled 2011-04-27: qty 0.96

## 2011-04-27 MED ORDER — ASPIRIN 81 MG PO CHEW
CHEWABLE_TABLET | ORAL | Status: AC
  Administered 2011-04-27: 11:00:00
  Filled 2011-04-27: qty 3

## 2011-04-27 MED ORDER — DIPHENHYDRAMINE HCL 50 MG/ML IJ SOLN
INTRAMUSCULAR | Status: AC
  Filled 2011-04-27: qty 1

## 2011-04-27 MED ORDER — ONDANSETRON HCL 4 MG/2ML IJ SOLN: 4.0000 mg | Freq: Four times a day (QID) | INTRAMUSCULAR | Status: DC | PRN

## 2011-04-27 MED ORDER — ZOLPIDEM TARTRATE 5 MG PO TABS: 5.0000 mg | ORAL_TABLET | Freq: Every evening | ORAL | Status: DC | PRN

## 2011-04-27 MED ORDER — ALPRAZOLAM 0.25 MG PO TABS
0.2500 mg | ORAL_TABLET | Freq: Three times a day (TID) | ORAL | Status: DC | PRN
  Administered 2011-04-27 – 2011-04-28 (×2): 0.25 mg via ORAL
  Filled 2011-04-27 (×2): qty 1

## 2011-04-27 MED ORDER — HEPARIN (PORCINE) IN NACL 2-0.9 UNIT/ML-% IJ SOLN
INTRAMUSCULAR | Status: AC
  Filled 2011-04-27: qty 2000

## 2011-04-27 MED ORDER — GI COCKTAIL ~~LOC~~
30.0000 mL | Freq: Two times a day (BID) | ORAL | Status: DC | PRN
  Filled 2011-04-27: qty 30

## 2011-04-27 MED ORDER — BIOTENE DRY MOUTH MT LIQD: 15.0000 mL | Freq: Two times a day (BID) | OROMUCOSAL | Status: DC

## 2011-04-27 MED ORDER — NITROGLYCERIN IN D5W 200-5 MCG/ML-% IV SOLN
INTRAVENOUS | Status: AC
  Filled 2011-04-27: qty 250

## 2011-04-27 MED ORDER — DIPHENHYDRAMINE HCL 50 MG/ML IJ SOLN
50.0000 mg | Freq: Once | INTRAMUSCULAR | Status: AC
  Administered 2011-04-28: 50 mg via INTRAVENOUS
  Filled 2011-04-27: qty 1

## 2011-04-27 MED ORDER — FAMOTIDINE IN NACL 20-0.9 MG/50ML-% IV SOLN
INTRAVENOUS | Status: AC
  Filled 2011-04-27: qty 50

## 2011-04-27 MED ORDER — PANTOPRAZOLE SODIUM 40 MG IV SOLR
40.0000 mg | Freq: Once | INTRAVENOUS | Status: AC
  Administered 2011-04-27: 40 mg via INTRAVENOUS
  Filled 2011-04-27: qty 40

## 2011-04-27 MED ORDER — SODIUM CHLORIDE 0.9 % IV SOLN
INTRAVENOUS | Status: AC
  Administered 2011-04-27: 13:00:00 via INTRAVENOUS

## 2011-04-27 MED ORDER — TRAMADOL HCL 50 MG PO TABS
50.0000 mg | ORAL_TABLET | Freq: Four times a day (QID) | ORAL | Status: DC | PRN
  Filled 2011-04-27: qty 1

## 2011-04-27 MED ORDER — HEPARIN (PORCINE) IN NACL 100-0.45 UNIT/ML-% IJ SOLN
1050.0000 [IU]/h | INTRAMUSCULAR | Status: DC
  Administered 2011-04-27: 800 [IU]/h via INTRAVENOUS
  Filled 2011-04-27 (×3): qty 250

## 2011-04-27 MED ORDER — LOSARTAN POTASSIUM 50 MG PO TABS
100.0000 mg | ORAL_TABLET | Freq: Two times a day (BID) | ORAL | Status: DC
  Administered 2011-04-27 – 2011-04-28 (×3): 100 mg via ORAL
  Filled 2011-04-27 (×4): qty 2

## 2011-04-27 MED ORDER — HEPARIN SODIUM (PORCINE) 5000 UNIT/ML IJ SOLN
INTRAMUSCULAR | Status: AC
  Administered 2011-04-27: 11:00:00
  Filled 2011-04-27: qty 1

## 2011-04-27 MED ORDER — ATROPINE SULFATE 1 MG/ML IJ SOLN
INTRAMUSCULAR | Status: AC
  Filled 2011-04-27: qty 1

## 2011-04-27 MED ORDER — ACETAMINOPHEN 325 MG PO TABS
650.0000 mg | ORAL_TABLET | ORAL | Status: DC | PRN
  Administered 2011-04-28: 650 mg via ORAL
  Filled 2011-04-27: qty 2

## 2011-04-27 MED ORDER — BIVALIRUDIN 250 MG IV SOLR
INTRAVENOUS | Status: AC
  Filled 2011-04-27: qty 250

## 2011-04-27 NOTE — Progress Notes (Signed)
ANTICOAGULATION CONSULT NOTE - Initial Consult  Pharmacy Consult for heparin Indication: Rule out PE  No Known Allergies  Patient Measurements: Heparin Dosing Weight: 65kg  Vital Signs: Temp: 98.2 F (36.8 C) (03/21 1048) BP: 206/61 mmHg (03/21 1048) Pulse Rate: 69  (03/21 1109)  Labs:  Basename 04/27/11 1111 04/27/11 1055  HGB 17.0 16.6  HCT 50.0 47.5  PLT -- 218  APTT -- --  LABPROT -- 15.1  INR -- 1.17  HEPARINUNFRC -- --  CREATININE 0.80 0.69  CKTOTAL -- --  CKMB -- --  TROPONINI -- <0.30   CrCl is unknown because there is no height on file for the current visit.  Medical History: Past Medical History  Diagnosis Date  . COPD (chronic obstructive pulmonary disease)   . Hypertension   . Coronary artery disease   . PVD (peripheral vascular disease)   . Dyslipidemia     Medications: Home meds pending  Assessment: 76 year old male with extensive history of PVD, presented to [facility] with chest pain and was taken urgently to cath for possible STeMI. Cath did not show any occlusions, but there is concern for PE and cause of chest pain. Sheaths are currently still present and sutured. Orders received to start IV heparin 4 hours after sheaths are removed.  Goal of Therapy:  Heparin level 0.3-0.7 units/ml   Plan:  No bolus  Start IV heparin at 800 units/hr (follow up sheath pull time) HL 8 hours after start   Gary [name] 04/27/2011,1:10 PM

## 2011-04-27 NOTE — Progress Notes (Signed)
Responded to page to provide Chaplain support to pt.s family and friend.  Pt.'s friend brought  Pt . to hospital. Friend said pt. Live with son Mazin Emma).  I contacted the Son at [phone].  Assisted staff with information sharing.  Prayed with family and offered encouragement. Will follow as needed.   04/27/11 1100  Clinical Encounter Type  Visited With Patient;Other (Comment) (Friend - Jimmye Norman)  Visit Type Pre-op  Referral From Nurse  Spiritual Encounters  Spiritual Needs Emotional  Stress Factors  Patient Stress Factors Other (Comment) (Alert and calm for most part)

## 2011-04-27 NOTE — Op Note (Signed)
Gary [name] is a 76 y.o. male    [id number] LOCATION:  FACILITY: [facility]  PHYSICIAN: Nanetta Batty, M.D. 12/04/1935   DATE OF PROCEDURE:  04/27/2011  DATE OF DISCHARGE:  [facility]  CARDIAC CATHETERIZATION     History obtained from chart review. 76 y/o with a history of PVD, s/p LFPBPG 2004. Dopplers OK Aug 2012. He does not have a history of CAD. Myoview low risk 2010. Echo has shown NL LVF in the past. LOV with Dr Leisa Lenz 9/12. Pt says he started having chest pain off and on last night. This am his symptoms worsened and he presented to [facility] ER. In the ER he had ant ST elevation and was taken urgently to the cath lab.    PROCEDURE DESCRIPTION:    The patient was brought to the second floor  [place] cath lab in the postabsorptive state. He was not  premedicated . His right groin was prepped and shaved in usual sterile fashion. Xylocaine 1% was used  for local anesthesia. A 6 French sheath was inserted into the right common femoral  artery using standard Seldinger technique. The patient received  4000 units  of heparin  Intravenously in the emergency room. 5 French right and left Judkins diagnostic catheters along with a 5 French pigtail catheter were used for selective coronary angiography, left ventriculography, supra-valvular aortography and distal abdominal aortography. Visipaque dye was used for the entirety of the case. Retrograde aorta, left ventricular and pullback pressures were recorded.    HEMODYNAMICS:    AO SYSTOLIC/AO DIASTOLIC: 224/80   LV SYSTOLIC/LV DIASTOLIC: 220/12  ANGIOGRAPHIC RESULTS:   1. Left main; normal  2. LAD; normal 3. Left circumflex; dominant with minor irregularities. There was a small marginal branch that had a 95% ostial stenosis of probably little clinical significance..  4. Right coronary artery; nondominant and normal 5. Left ventriculography; RAO left ventriculogram was performed using  25 mL of  Visipaque dye at 12 mL/second. The overall LVEF estimated  60 %  Without wall motion abnormalities  6. Supravalvular aortography-normal without dissection  7. Distal abdominal aortography: Normal renal arteries, normal infrarenal doubt aorta and iliac bifurcation  IMPRESSION:Gary [name] has essentially normal coronary arteries and normal left ventricular function. I suspect that his EKG changes were related to left-ventricular hypertrophy with repolarization changes. The etiology of his chest pain is noncardiac. We'll check a d-dimer to rule out pulmonary embolus and we'll treat him empirically for reflux. His renal artery arteries are widely patent ruling out renal vascular hypertension. The sheath was removed and pressure was held on the groin to achieve hemostasis. The patient left the cardiac cath lab in stable condition. He'll be gently hydrated overnight probably discharged in the next 24-48 hours.  Runell Gess MD, [facility] 04/27/2011 11:48 AM

## 2011-04-27 NOTE — CV Procedure (Signed)
Discontinued 63f Arterial Sheath from Right Goin site per MD orders and unit guidelines. No bleeding, hematoma, bruising, or any other abnormalities present during and post sheath removal. Applied manual pressure for 20 minutes and applied sterile dry gauze dressing post sheath removal. Pedal pulses present via Doppler. HR 90s, B/P 140s-160s/40s-50s. 100% o2 sat per pulse oximetry and patient on 2 L 02 via [place]. Post instructions given to patient and patient verbally repeated instructions. Justice Rocher, RCIS at bedside monitoring vitals and patient status during sheath pull. 2900 RN made aware of groin site by showing site and 2900 RN will continue to monitor. No complaints from patient.  Nely Dedmon

## 2011-04-27 NOTE — Progress Notes (Signed)
Pt c/o chest pain/ discomfort with numbness in left arm.  Nitro drip titrated, 1mg  iv morphine given and 12-lead ekg obtained.  Pain relieved with medication. Dr. Allyson Sabal notified.  Orders received. Will continue to monitor.

## 2011-04-27 NOTE — ED Notes (Addendum)
Pt brought to room 17A at 1057. Pt placed on zoll pads and EDP to bedside. IV established, pt placed on 2L of O2 and Cath lab team to bedside at 1058. Pt already took 1 baby asa pta. Cardiology at bedside at 1100. Pt to cath lab at 1104. All of pts belongings brought to cath lab with pt.

## 2011-04-27 NOTE — ED Notes (Signed)
Chest pain that started last night  Some sob  No nausea

## 2011-04-27 NOTE — H&P (Signed)
Patient ID: Gary [name] MRN: [id number], DOB/AGE: 1935/09/01   Admit date: 04/27/2011   Primary Physician: No primary provider on file. Primary Cardiologist: Dr Alanda Amass  HPI:  76 y/o with a history of PVD, s/p LFPBPG 2004. Dopplers OK Aug 2012. He does not have a history of CAD. Myoview low risk 2010. Echo has shown NL LVF in the past. LOV with Dr Leisa Lenz 9/12. Pt says he started having chest pain off and on last night. This am his symptoms worsened and he presented to [facility] ER. In the ER he had ant ST elevation and was taken  urgently to the cath lab.    Problem List: Past Medical History  Diagnosis Date  . COPD (chronic obstructive pulmonary disease)   . Hypertension   . Coronary artery disease   . PVD (peripheral vascular disease)   . Dyslipidemia     Past Surgical History  Procedure Date  . Foot surgery   . Nasal sinus surgery   . Femoral-popliteal bypass graft 2004     Allergies: No Known Allergies   Home Medications No prescriptions prior to admission     No family history on file.   History   Social History  . Marital Status: Divorced    Spouse Name: N/A    Number of Children: 3  . Years of Education: N/A   Occupational History  . welder, pipe fitter    Social History Main Topics  . Smoking status: Former Smoker -- 1.0 packs/day    Types: Cigarettes  . Smokeless tobacco: Former Neurosurgeon    Quit date: 04/27/1998  . Alcohol Use: Yes  . Drug Use: Not on file  . Sexually Active: Not on file   Other Topics Concern  . Not on file   Social History Narrative  . No narrative on file     Review of Systems: Not obtained as pt was taken urgently to cath lab  Physical Exam: Blood pressure 206/61, pulse 62, temperature 98.2 F (36.8 C), resp. rate 16, SpO2 99.00%.  Per Dr Allyson Sabal    Labs:   Results for orders placed during the hospital encounter of 04/27/11 (from the past 24 hour(s))  CBC     Status: Normal   Collection Time   04/27/11 10:55 AM   Component Value Range   WBC 8.5  4.0 - 10.5 (K/uL)   RBC 5.35  4.22 - 5.81 (MIL/uL)   Hemoglobin 16.6  13.0 - 17.0 (g/dL)   HCT 65.7  84.6 - 96.2 (%)   MCV 88.8  78.0 - 100.0 (fL)   MCH 31.0  26.0 - 34.0 (pg)   MCHC 34.9  30.0 - 36.0 (g/dL)   RDW 95.2  84.1 - 32.4 (%)   Platelets 218  150 - 400 (K/uL)  DIFFERENTIAL     Status: Normal   Collection Time   04/27/11 10:55 AM      Component Value Range   Neutrophils Relative 63  43 - 77 (%)   Neutro Abs 5.4  1.7 - 7.7 (K/uL)   Lymphocytes Relative 23  12 - 46 (%)   Lymphs Abs 1.9  0.7 - 4.0 (K/uL)   Monocytes Relative 12  3 - 12 (%)   Monocytes Absolute 1.0  0.1 - 1.0 (K/uL)   Eosinophils Relative 2  0 - 5 (%)   Eosinophils Absolute 0.2  0.0 - 0.7 (K/uL)   Basophils Relative 0  0 - 1 (%)   Basophils Absolute 0.0  0.0 -  0.1 (K/uL)  PROTIME-INR     Status: Normal   Collection Time   04/27/11 10:55 AM      Component Value Range   Prothrombin Time 15.1  11.6 - 15.2 (seconds)   INR 1.17  0.00 - 1.49   POCT I-STAT, CHEM 8     Status: Abnormal   Collection Time   04/27/11 11:11 AM      Component Value Range   Sodium 137  135 - 145 (mEq/L)   Potassium 3.7  3.5 - 5.1 (mEq/L)   Chloride 102  96 - 112 (mEq/L)   BUN 14  6 - 23 (mg/dL)   Creatinine, Ser 1.19  0.50 - 1.35 (mg/dL)   Glucose, Bld 147 (*) 70 - 99 (mg/dL)   Calcium, Ion 8.29  5.62 - 1.32 (mmol/L)   TCO2 25  0 - 100 (mmol/L)   Hemoglobin 17.0  13.0 - 17.0 (g/dL)   HCT 13.0  86.5 - 78.4 (%)     Radiology/Studies: No results found.  EKG:Ant ST elevation  ASSESSMENT AND PLAN:  Principal Problem:  *STEMI (ST elevation myocardial infarction) Active Problems:  PVD, LFBPG 2004, dopplers OK 8/12  HTN   Dyslipidemia  Plan- Urgent cath. H/P will be completed once pt is stabilized.  Deland Pretty, PA-C 04/27/2011, 11:24 AM

## 2011-04-28 ENCOUNTER — Inpatient Hospital Stay (HOSPITAL_COMMUNITY): Payer: Medicare Other

## 2011-04-28 ENCOUNTER — Encounter (HOSPITAL_COMMUNITY): Payer: Self-pay | Admitting: Cardiology

## 2011-04-28 DIAGNOSIS — I2119 ST elevation (STEMI) myocardial infarction involving other coronary artery of inferior wall: Secondary | ICD-10-CM | POA: Diagnosis not present

## 2011-04-28 DIAGNOSIS — R209 Unspecified disturbances of skin sensation: Secondary | ICD-10-CM | POA: Diagnosis not present

## 2011-04-28 DIAGNOSIS — K449 Diaphragmatic hernia without obstruction or gangrene: Secondary | ICD-10-CM | POA: Diagnosis not present

## 2011-04-28 DIAGNOSIS — R079 Chest pain, unspecified: Secondary | ICD-10-CM | POA: Diagnosis not present

## 2011-04-28 DIAGNOSIS — F1010 Alcohol abuse, uncomplicated: Secondary | ICD-10-CM | POA: Diagnosis present

## 2011-04-28 DIAGNOSIS — K219 Gastro-esophageal reflux disease without esophagitis: Secondary | ICD-10-CM | POA: Diagnosis present

## 2011-04-28 LAB — BASIC METABOLIC PANEL
BUN: 17 mg/dL (ref 6–23)
CO2: 23 mEq/L (ref 19–32)
Calcium: 8.8 mg/dL (ref 8.4–10.5)
Chloride: 99 mEq/L (ref 96–112)
Creatinine, Ser: 0.77 mg/dL (ref 0.50–1.35)
GFR calc Af Amer: >90 mL/min (ref >90)
GFR calc non Af Amer: 88 mL/min — ABNORMAL LOW (ref >90)
Glucose, Bld: 136 mg/dL — ABNORMAL HIGH (ref 70–99)
Potassium: 3.4 mEq/L — ABNORMAL LOW (ref 3.5–5.1)
Sodium: 135 mEq/L (ref 135–145)

## 2011-04-28 LAB — CBC
HCT: 42.5 % (ref 39.0–52.0)
MCH: 30.8 pg (ref 26.0–34.0)
MCHC: 34.8 g/dL (ref 30.0–36.0)
MCV: 88.4 fL (ref 78.0–100.0)
RDW: 14 % (ref 11.5–15.5)

## 2011-04-28 LAB — HEPARIN LEVEL (UNFRACTIONATED): Heparin Unfractionated: 0.11 IU/mL — ABNORMAL LOW (ref 0.30–0.70)

## 2011-04-28 MED ORDER — PANTOPRAZOLE SODIUM 40 MG PO TBEC: 40.0000 mg | DELAYED_RELEASE_TABLET | Freq: Every day | ORAL | Status: DC

## 2011-04-28 MED ORDER — ACETAMINOPHEN 325 MG PO TABS: 650.0000 mg | ORAL_TABLET | ORAL | Status: AC | PRN

## 2011-04-28 MED ORDER — ISOSORBIDE MONONITRATE ER 30 MG PO TB24
30.0000 mg | ORAL_TABLET | Freq: Every day | ORAL | Status: DC
  Administered 2011-04-28: 30 mg via ORAL
  Filled 2011-04-28: qty 1

## 2011-04-28 MED ORDER — HYDRALAZINE HCL 50 MG PO TABS
75.0000 mg | ORAL_TABLET | Freq: Two times a day (BID) | ORAL | Status: DC
  Administered 2011-04-28: 75 mg via ORAL
  Filled 2011-04-28 (×2): qty 1

## 2011-04-28 MED ORDER — IOHEXOL 350 MG/ML SOLN
100.0000 mL | Freq: Once | INTRAVENOUS | Status: AC | PRN
  Administered 2011-04-28: 100 mL via INTRAVENOUS

## 2011-04-28 MED FILL — Dextrose Inj 5%: INTRAVENOUS | Qty: 50 | Status: AC

## 2011-04-28 NOTE — Progress Notes (Signed)
[facility] DAILY PROGRESS NOTE  NAME:  Gary [name]   MRN: [id number] DOB:  Apr 01, 1935   ADMIT DATE: 04/27/2011   Patient Description   76 y.o. male with PMH below (notalby PAD, HLD, HTN &  COPD) presented with off & on CP o/v 3/20 --> worse in AM of 3/21 --> to ER with ECG suggesting STEMI.    Past Medical History  Diagnosis Date  . COPD (chronic obstructive pulmonary disease)   . Hypertension   . PVD (peripheral vascular disease) 04/27/2011    Normal Renal & abdominal Aortography; s/p Left Fem-Pop BPG 2004.  Marland Kitchen Dyslipidemia   . Coronary artery disease 04/27/2011    Low risk Myoview in 2010:Non-obstructive disase, except for small OMB 95% ostial lesion    Clinical Course:  Extremely Hypertensive on arrival (SBPs in 200s). Emergent Cardiac Catheterization - non-obstructive disease.  Length of Stay:  LOS: 1 day    Subjective:   Gary [name] is feeling better, with no further CP. No SOB - just feels congested  Objective:  Temp:  [97.8 F (36.6 C)-98.4 F (36.9 C)] 97.8 F (36.6 C) (03/22 0800) Pulse Rate:  [58-82] 58  (03/22 0800) Resp:  [14-27] 17  (03/22 0800) BP: (104-211)/(45-192) 138/60 mmHg (03/22 0800) SpO2:  [95 %-99 %] 96 % (03/22 0800) Arterial Line BP: (158-191)/(50-66) 183/64 mmHg (03/21 1445) Weight:  [65.77 kg (144 lb 15.9 oz)] 65.77 kg (144 lb 15.9 oz) (03/21 1300) Weight change:  Physical Exam: General appearance: alert, cooperative, appears stated age and no distress Neck: no adenopathy, no JVD, supple, symmetrical, trachea midline, thyroid not enlarged, symmetric, no tenderness/mass/nodules and bialteral carotid bruits  Lungs: normal effort and CTAB with exception of bibasal dry crackles Heart: regular rate and rhythm, S1, S2 normal, no S3 or S4, systolic murmur: systolic ejection 2/6, crescendo, decrescendo and harsh at 2nd right intercostal space and no rub Abdomen: soft, non-tender; bowel sounds normal; no masses,   no organomegaly Extremities: extremities normal, atraumatic, no cyanosis or edema Pulses: diminished DP/PT pulses bilaterally Neurologic: Grossly normal  Intake/Output from previous day: 03/21 0701 - 03/22 0700 In: 1622.7 [P.O.:800; I.V.:822.7] Out: 500 [Urine:500]  Intake/Output Summary (Last 24 hours) at 04/28/11 0859 Last data filed at 04/28/11 0800  Gross per 24 hour  Intake 1893.72 ml  Output    500 ml  Net 1393.72 ml    Pertinent Lab Results: K 3.4, Bun/Cr 17/0.77; D-dimer mildly elevated @ 1.52; Troponin negative CBC: stable H/H, WBC 7.2  Imaging: CTA Chest Pending!  Cath 3/21 Emergent for / STEMI: 1. Left main; normal; 2. LAD; normal; 3. Left circumflex; dominant with minor irregularities. There was a small marginal branch that had a 95% ostial stenosis of probably little clinical significance; 4. Right coronary artery; nondominant and normal; 5. Left ventriculography; EF ~60 % w/o WMA; 6. Supravalvular aortography-normal without dissection; 7. Distal abdominal aortography: Normal renal arteries, normal infrarenal doubt aorta and iliac bifurcation  MAR Reviewed  Assessment/Plan:  Principal Problem:  *STEMI (ST elevation myocardial infarction) - non-obstructive CAD by Cath on admission Active Problems:  PVD, LFBPG 2004, dopplers OK 8/12  HTN   Dyslipidemia  PLAN: CTA chest to rule out PE; on IV Heparin until PE adequately ruled out. HTN - on ARB & Hydralazine & HCTZ (with baseline HR in 50s-60s would avoid BB or dihydropyridine CCBs) - will increase hydralazine dose to avoid adding additional medication. Wean off NTG - change to PO nitrate Contrast media hypersensitivity --  on Methylprednisolone post-cath & for CTA chest.   Allergies acting up - will add Zytec  Anticipate d/c today if all studies normal, otherwise can at least transfer to telemetry.  Time Spent Directly with Patient:  10 minutes   Zipporah Finamore W, M.D., M.S. [facility] [address] [place], [place]  [address]  [phone]  04/28/2011 8:59 AM

## 2011-04-28 NOTE — Discharge Instructions (Signed)
Chest Pain (Nonspecific) It is often hard to give a specific diagnosis for the cause of chest pain. There is always a chance that your pain could be related to something serious, such as a heart attack or a blood clot in the lungs. You need to follow up with your caregiver for further evaluation. CAUSES   Heartburn.   Pneumonia or bronchitis.   Anxiety or stress.   Inflammation around your heart (pericarditis) or lung (pleuritis or pleurisy).   A blood clot in the lung.   A collapsed lung (pneumothorax). It can develop suddenly on its own (spontaneous pneumothorax) or from injury (trauma) to the chest.   Shingles infection (herpes zoster virus).  The chest wall is composed of bones, muscles, and cartilage. Any of these can be the source of the pain.  The bones can be bruised by injury.   The muscles or cartilage can be strained by coughing or overwork.   The cartilage can be affected by inflammation and become sore (costochondritis).  DIAGNOSIS  Lab tests or other studies, such as X-rays, electrocardiography, stress testing, or cardiac imaging, may be needed to find the cause of your pain.  TREATMENT   Treatment depends on what may be causing your chest pain. Treatment may include:   Acid blockers for heartburn.   Anti-inflammatory medicine.   Pain medicine for inflammatory conditions.   Antibiotics if an infection is present.   You may be advised to change lifestyle habits. This includes stopping smoking and avoiding alcohol, caffeine, and chocolate.   You may be advised to keep your head raised (elevated) when sleeping. This reduces the chance of acid going backward from your stomach into your esophagus.   Most of the time, nonspecific chest pain will improve within 2 to 3 days with rest and mild pain medicine.  HOME CARE INSTRUCTIONS   If antibiotics were prescribed, take your antibiotics as directed. Finish them even if you start to feel better.   For the next few  days, avoid physical activities that bring on chest pain. Continue physical activities as directed.   Do not smoke.   Avoid drinking alcohol.   Only take over-the-counter or prescription medicine for pain, discomfort, or fever as directed by your caregiver.   Follow your caregiver's suggestions for further testing if your chest pain does not go away.   Keep any follow-up appointments you made. If you do not go to an appointment, you could develop lasting (chronic) problems with pain. If there is any problem keeping an appointment, you must call to reschedule.  SEEK MEDICAL CARE IF:   You think you are having problems from the medicine you are taking. Read your medicine instructions carefully.   Your chest pain does not go away, even after treatment.   You develop a rash with blisters on your chest.  SEEK IMMEDIATE MEDICAL CARE IF:   You have increased chest pain or pain that spreads to your arm, neck, jaw, back, or abdomen.   You develop shortness of breath, an increasing cough, or you are coughing up blood.   You have severe back or abdominal pain, feel nauseous, or vomit.   You develop severe weakness, fainting, or chills.   You have a fever.  THIS IS AN EMERGENCY. Do not wait to see if the pain will go away. Get medical help at once. Call your local emergency services (911 in [place] Do not drive yourself to the hospital. MAKE SURE YOU:   Understand these instructions.     Will watch your condition.   Will get help right away if you are not doing well or get worse.  Document Released: 11/02/2004 Document Revised: 01/12/2011 Document Reviewed: 08/29/2007 [employer] Patient Information 2012 [employer]. 

## 2011-04-28 NOTE — Plan of Care (Signed)
Problem: Phase II Progression Outcomes Goal: Ambulates up to 600 ft. in hall x 1 Outcome: Progressing Ambulated 20 feet in room; tolerated well;

## 2011-04-28 NOTE — Progress Notes (Signed)
Nutrition Brief Note  RD pulled to pt due to problems chewing or swallowing foods and/or liquids per admission nutrition screen. Pt reports he's had his "esophagus stretched" in the past. Per chart review, esophageal dilatation completed in February 2002. Pt consuming 100% on a Heart Healthy diet. BMI = 22.8 kg/m2. Reports weight stability. No nutrition intervention warranted at this time. Please consult RD as needed.  Alger Memos, RD Pager #: [phone]

## 2011-04-28 NOTE — Progress Notes (Signed)
UR Completed. [name], Gary Virrueta F [phone]  

## 2011-04-28 NOTE — Discharge Summary (Signed)
Patient ID: Gary [name],  MRN: [id number], DOB/AGE: 09-21-1935 76 y.o.  Admit date: 04/27/2011 Discharge date: 04/28/2011  Primary Care Provider:  Primary Cardiologist: Dr Alanda Amass  Discharge Diagnoses  Principal Problem:  *STEMI (ST elevation myocardial infarction) - non-obstructive CAD by Cath on admission. Negative Troponin  Active Problems:  PVD, LFBPG 2004, dopplers OK 8/12  GERD (gastroesophageal reflux disease)  ETOH abuse  HTN   Dyslipidemia    Procedures: Urgent cath 04/27/11  Hospital Course HPI: 76 y/o with a history of PVD, s/p LFPBPG 2004. Dopplers OK Aug 2012. He does not have a history of CAD. Myoview low risk 2010. Echo has shown NL LVF 2012 with AOV sclerosis.Marland Kitchen LOV with Dr Leisa Lenz 9/12. Pt says he started having chest pain off and on the night prior to admission. On the morning of admission his symptoms worsened and he presented to [facility] ER. In the ER he had ant ST elevation and was taken urgently to the cath lab. Cath revealed stenosis in a small branch but no critical CAD. His d dimer was slightly elevated, CTA was negative for PE or dissection. Troponin was negative. We feel he can discharged 04/28/11 on PPI and follow up with Dr Leisa Lenz.    Discharge Vitals:  Blood pressure 126/42, pulse 63, temperature 97.3 F (36.3 C), temperature source Axillary, resp. rate 16, height 5\' 7"  (1.702 m), weight 65.77 kg (144 lb 15.9 oz), SpO2 97.00%.    Labs: Results for orders placed during the hospital encounter of 04/27/11 (from the past 48 hour(s))  TROPONIN I     Status: Normal   Collection Time   04/27/11 10:55 AM      Component Value Range Comment   Troponin I <0.30  <0.30 (ng/mL)   COMPREHENSIVE METABOLIC PANEL     Status: Abnormal   Collection Time   04/27/11 10:55 AM      Component Value Range Comment   Sodium 136  135 - 145 (mEq/L)    Potassium 3.6  3.5 - 5.1 (mEq/L)    Chloride 99  96 - 112 (mEq/L)    CO2 26  19 - 32 (mEq/L)    Glucose, Bld 109 (*) 70 - 99 (mg/dL)      BUN 13  6 - 23 (mg/dL)    Creatinine, Ser 4.09  0.50 - 1.35 (mg/dL)    Calcium 9.5  8.4 - 10.5 (mg/dL)    Total Protein 6.7  6.0 - 8.3 (g/dL)    Albumin 3.3 (*) 3.5 - 5.2 (g/dL)    AST 17  0 - 37 (U/L)    ALT 16  0 - 53 (U/L)    Alkaline Phosphatase 51  39 - 117 (U/L)    Total Bilirubin 0.5  0.3 - 1.2 (mg/dL)    GFR calc non Af Amer >90  >90 (mL/min)    GFR calc Af Amer >90  >90 (mL/min)   CBC     Status: Normal   Collection Time   04/27/11 10:55 AM      Component Value Range Comment   WBC 8.5  4.0 - 10.5 (K/uL)    RBC 5.35  4.22 - 5.81 (MIL/uL)    Hemoglobin 16.6  13.0 - 17.0 (g/dL)    HCT 81.1  91.4 - 78.2 (%)    MCV 88.8  78.0 - 100.0 (fL)    MCH 31.0  26.0 - 34.0 (pg)    MCHC 34.9  30.0 - 36.0 (g/dL)    RDW 95.6  21.3 -  15.5 (%)    Platelets 218  150 - 400 (K/uL)   DIFFERENTIAL     Status: Normal   Collection Time   04/27/11 10:55 AM      Component Value Range Comment   Neutrophils Relative 63  43 - 77 (%)    Neutro Abs 5.4  1.7 - 7.7 (K/uL)    Lymphocytes Relative 23  12 - 46 (%)    Lymphs Abs 1.9  0.7 - 4.0 (K/uL)    Monocytes Relative 12  3 - 12 (%)    Monocytes Absolute 1.0  0.1 - 1.0 (K/uL)    Eosinophils Relative 2  0 - 5 (%)    Eosinophils Absolute 0.2  0.0 - 0.7 (K/uL)    Basophils Relative 0  0 - 1 (%)    Basophils Absolute 0.0  0.0 - 0.1 (K/uL)   PROTIME-INR     Status: Normal   Collection Time   04/27/11 10:55 AM      Component Value Range Comment   Prothrombin Time 15.1  11.6 - 15.2 (seconds)    INR 1.17  0.00 - 1.49    POCT I-STAT, CHEM 8     Status: Abnormal   Collection Time   04/27/11 11:11 AM      Component Value Range Comment   Sodium 137  135 - 145 (mEq/L)    Potassium 3.7  3.5 - 5.1 (mEq/L)    Chloride 102  96 - 112 (mEq/L)    BUN 14  6 - 23 (mg/dL)    Creatinine, Ser 1.61  0.50 - 1.35 (mg/dL)    Glucose, Bld 096 (*) 70 - 99 (mg/dL)    Calcium, Ion 0.45  1.12 - 1.32 (mmol/L)    TCO2 25  0 - 100 (mmol/L)    Hemoglobin 17.0  13.0 - 17.0  (g/dL)    HCT 40.9  81.1 - 91.4 (%)   D-DIMER, QUANTITATIVE     Status: Abnormal   Collection Time   04/27/11 11:30 AM      Component Value Range Comment   D-Dimer, Quant 1.52 (*) 0.00 - 0.48 (ug/mL-FEU)   POCT ACTIVATED CLOTTING TIME     Status: Normal   Collection Time   04/27/11 11:36 AM      Component Value Range Comment   Activated Clotting Time 188     MRSA PCR SCREENING     Status: Normal   Collection Time   04/27/11  1:05 PM      Component Value Range Comment   MRSA by PCR NEGATIVE  NEGATIVE    POCT ACTIVATED CLOTTING TIME     Status: Normal   Collection Time   04/27/11  1:13 PM      Component Value Range Comment   Activated Clotting Time 143     HEPARIN LEVEL (UNFRACTIONATED)     Status: Abnormal   Collection Time   04/28/11  3:02 AM      Component Value Range Comment   Heparin Unfractionated 0.11 (*) 0.30 - 0.70 (IU/mL)   BASIC METABOLIC PANEL     Status: Abnormal   Collection Time   04/28/11  3:02 AM      Component Value Range Comment   Sodium 135  135 - 145 (mEq/L)    Potassium 3.4 (*) 3.5 - 5.1 (mEq/L)    Chloride 99  96 - 112 (mEq/L)    CO2 23  19 - 32 (mEq/L)    Glucose, Bld 136 (*) 70 -  99 (mg/dL)    BUN 17  6 - 23 (mg/dL)    Creatinine, Ser 4.78  0.50 - 1.35 (mg/dL)    Calcium 8.8  8.4 - 10.5 (mg/dL)    GFR calc non Af Amer 88 (*) >90 (mL/min)    GFR calc Af Amer >90  >90 (mL/min)   CBC     Status: Normal   Collection Time   04/28/11  3:02 AM      Component Value Range Comment   WBC 7.2  4.0 - 10.5 (K/uL)    RBC 4.81  4.22 - 5.81 (MIL/uL)    Hemoglobin 14.8  13.0 - 17.0 (g/dL)    HCT 29.5  62.1 - 30.8 (%)    MCV 88.4  78.0 - 100.0 (fL)    MCH 30.8  26.0 - 34.0 (pg)    MCHC 34.8  30.0 - 36.0 (g/dL)    RDW 65.7  84.6 - 96.2 (%)    Platelets 223  150 - 400 (K/uL)   HEPARIN LEVEL (UNFRACTIONATED)     Status: Abnormal   Collection Time   04/28/11 12:32 PM      Component Value Range Comment   Heparin Unfractionated 0.18 (*) 0.30 - 0.70 (IU/mL)      Disposition: Discharged in stable condition  Discharge Medications:  Medication List  As of 04/28/2011  2:34 PM   TAKE these medications         acetaminophen 325 MG tablet   Commonly known as: TYLENOL   Take 2 tablets (650 mg total) by mouth every 4 (four) hours as needed.      hydrALAZINE 50 MG tablet   Commonly known as: APRESOLINE   Take 50 mg by mouth 2 (two) times daily.      hydrochlorothiazide 25 MG tablet   Commonly known as: HYDRODIURIL   Take 25 mg by mouth daily.      losartan 100 MG tablet   Commonly known as: COZAAR   Take 100 mg by mouth 2 (two) times daily.      metoprolol succinate 50 MG 24 hr tablet   Commonly known as: TOPROL-XL   Take 25 mg by mouth daily. Take with or immediately following a meal.      pantoprazole 40 MG tablet   Commonly known as: PROTONIX   Take 1 tablet (40 mg total) by mouth daily.            Outstanding Labs/Studies  Duration of Discharge Encounter: Greater than 30 minutes including physician time.  Jolene Provost PA-C 04/28/2011 2:34 PM

## 2011-04-28 NOTE — Progress Notes (Signed)
ANTICOAGULATION CONSULT NOTE - Follow Up  Pharmacy Consult for heparin Indication: Rule out PE  Allergies  Allergen Reactions  . Contrast Media (Iodinated Diagnostic Agents) Itching    Patient Measurements: Heparin Dosing Weight: 65kg  Vital Signs: Temp: 98.4 F (36.9 C) (03/21 2000) Temp src: Oral (03/21 2000) BP: 153/55 mmHg (03/22 0300) Pulse Rate: 64  (03/22 0300)  Labs:  Basename 04/28/11 0302 04/27/11 1111 04/27/11 1055  HGB 14.8 17.0 --  HCT 42.5 50.0 47.5  PLT 223 -- 218  APTT -- -- --  LABPROT -- -- 15.1  INR -- -- 1.17  HEPARINUNFRC 0.11* -- --  CREATININE -- 0.80 0.69  CKTOTAL -- -- --  CKMB -- -- --  TROPONINI -- -- <0.30   Estimated Creatinine Clearance: 76.5 ml/min (by C-G formula based on Cr of 0.8).  Medical History: Past Medical History  Diagnosis Date  . COPD (chronic obstructive pulmonary disease)   . Hypertension   . PVD (peripheral vascular disease)   . Dyslipidemia   . Coronary artery disease     Medications: Home meds pending  Assessment: 76 year old male with extensive history of PVD, presented to [facility] with chest pain and was taken urgently to cath for possible STEMI. Cath did not show any occlusions, and heparin restarted 4 hr post-sheath removal for r/o PE.  Heparin level (0.11) is below-goal on 800 units/hr. No problem with line per RN.  Goal of Therapy:  Heparin level 0.3-0.7 units/ml   Plan:  1. Increase IV heparin to 1050 units/hr. 2. Heparin level in 8 hours.   Emeline Gins 04/28/2011,4:23 AM

## 2011-04-28 NOTE — Progress Notes (Signed)
   CARE MANAGEMENT NOTE 04/28/2011  Patient:  [name],[name]   Account Number:  [id number]  Date Initiated:  04/28/2011  Documentation initiated by:  GRAVES-BIGELOW,Brad Mcgaughy  Subjective/Objective Assessment:   Pt admittted with STEMI. Plan for home today. CM tried to assist pt with trilipix by giving him a pt assistance form, however pt did not want to take application due to he stated he made to much money.     Action/Plan:   No other assistance available at this itme.   Anticipated DC Date:  04/28/2011   Anticipated DC Plan:  HOME/SELF CARE      DC Planning Services  CM consult      Choice offered to / List presented to:             Status of service:  Completed, signed off Medicare Important Message given?   (If response is "NO", the following Medicare IM given date fields will be blank) Date Medicare IM given:   Date Additional Medicare IM given:    Discharge Disposition:  HOME/SELF CARE  Per UR Regulation:    If discussed at Long Length of Stay Meetings, dates discussed:    Comments:

## 2011-05-05 ENCOUNTER — Ambulatory Visit (INDEPENDENT_AMBULATORY_CARE_PROVIDER_SITE_OTHER): Payer: Medicare Other | Admitting: Family Medicine

## 2011-05-05 ENCOUNTER — Ambulatory Visit: Payer: Medicare Other

## 2011-05-05 VITALS — BP 217/77 | HR 74 | Temp 98.8°F | Resp 16 | Ht 67.5 in | Wt 161.0 lb

## 2011-05-05 DIAGNOSIS — J3489 Other specified disorders of nose and nasal sinuses: Secondary | ICD-10-CM | POA: Diagnosis not present

## 2011-05-05 DIAGNOSIS — J340 Abscess, furuncle and carbuncle of nose: Secondary | ICD-10-CM

## 2011-05-05 DIAGNOSIS — I10 Essential (primary) hypertension: Secondary | ICD-10-CM | POA: Diagnosis not present

## 2011-05-05 MED ORDER — CEPHALEXIN 500 MG PO CAPS: 1000.0000 mg | ORAL_CAPSULE | Freq: Two times a day (BID) | ORAL | Status: AC

## 2011-05-05 NOTE — Progress Notes (Signed)
Patient Name: Gary [name] Date of Birth: 10-Oct-1935 Medical Record Number: [id number] Gender: male Date of Encounter: 05/05/2011  History of Present Illness:  Gary [name] is a 76 y.o. very pleasant male patient who presents with the following:  He has noted some soreness and swelling on the right side of his nose for about one month.  He has not drained any pus from the area.  No fever, no other symptoms. The area has waxed and waned some.   Of note he was at the hospital about one week ago with what was thought to be a STEMI, but he turned out to have a negative heart cath.  He has been compliant with his anti-hypertensives and took his medication this morning as usual.    Patient Active Problem List  Diagnoses  . STEMI (ST elevation myocardial infarction) - non-obstructive CAD by Cath on admission  . PVD, LFBPG 2004, dopplers OK 8/12  . HTN   . Dyslipidemia  . GERD (gastroesophageal reflux disease)  . ETOH abuse   Past Medical History  Diagnosis Date  . COPD (chronic obstructive pulmonary disease)   . Hypertension   . PVD (peripheral vascular disease) 04/27/2011    Normal Renal & abdominal Aortography; s/p Left Fem-Pop BPG 2004.  Marland Kitchen Dyslipidemia   . Coronary artery disease 04/27/2011    Low risk Myoview in 2010:Non-obstructive disase, except for small OMB 95% ostial lesion   Past Surgical History  Procedure Date  . Foot surgery   . Nasal sinus surgery   . Femoral-popliteal bypass graft 2004    Left sided   History  Substance Use Topics  . Smoking status: Former Smoker -- 1.0 packs/day    Types: Cigarettes  . Smokeless tobacco: Former Neurosurgeon    Quit date: 04/27/1998  . Alcohol Use: 1.2 oz/week    2 Cans of beer per week   No family history on file. Allergies  Allergen Reactions  . Contrast Media (Iodinated Diagnostic Agents) Itching  . Neomycin   . Neosporin (Triple Antibiotic)     Medication list has been reviewed and updated.  Review of  Systems: As per HPI- otherwise negative. No CP, no SOB.  Feels well except for his nose problem as above.  States his home BP is usually around 140/ ?Marland Kitchen   Physical Examination: Filed Vitals:   05/05/11 1316  BP: 217/77  Pulse: 74  Temp: 98.8 F (37.1 C)  TempSrc: Oral  Resp: 16  Height: 5' 7.5" (1.715 m)  Weight: 161 lb (73.029 kg)   Recheck BP about 185/75.  Looking back in chart this is not an unusual BP for this patient.  Body mass index is 24.84 kg/(m^2).  GEN: WDWN, NAD, Non-toxic, A & O x 3 HEENT: Atraumatic, Normocephalic. Neck supple. No masses, No LAD. Tip of nose has mild cellulitis. Nasal cavity wnl.  Oropharynx and TM wnl bilaterally Ears and Nose: No external deformity. CV: RRR, No M/G/R. No JVD. No thrill. No extra heart sounds. PULM: CTA B, no wheezes, crackles, rhonchi. No retractions. No resp. distress. No accessory muscle use. EXTR: No c/c/e NEURO Normal gait.  PSYCH: Normally interactive. Conversant. Not depressed or anxious appearing.  Calm demeanor.   Assessment and Plan: 1. Cellulitis of nose  cephALEXin (KEFLEX) 500 MG capsule  2. Hypertension     He has follow-up scheduled with cardiology pending. Patient (or parent if minor) instructed to return to clinic or call if not better in 2-3 day(s). Continue htn meds and  follow- up with cardiology as needed.

## 2011-05-07 NOTE — ED Provider Notes (Addendum)
History     CSN: [id number]  Arrival date & time 04/27/11  1043   First MD Initiated Contact with Patient 04/27/11 1055      Chief Complaint  Patient presents with  . Chest Pain     HPI Chest pain that started last night Some sob No nausea  Past Medical History  Diagnosis Date  . COPD (chronic obstructive pulmonary disease)   . Hypertension   . PVD (peripheral vascular disease) 04/27/2011    Normal Renal & abdominal Aortography; s/p Left Fem-Pop BPG 2004.  Marland Kitchen Dyslipidemia   . Coronary artery disease 04/27/2011    Low risk Myoview in 2010:Non-obstructive disase, except for small OMB 95% ostial lesion    Past Surgical History  Procedure Date  . Foot surgery   . Nasal sinus surgery   . Femoral-popliteal bypass graft 2004    Left sided    No family history on file.  History  Substance Use Topics  . Smoking status: Former Smoker -- 1.0 packs/day    Types: Cigarettes  . Smokeless tobacco: Former Neurosurgeon    Quit date: 04/27/1998  . Alcohol Use: 1.2 oz/week    2 Cans of beer per week      Review of Systems  Unable to perform ROS: Other  All other systems reviewed and are negative.    Allergies  Contrast media; Neomycin; and Neosporin  Home Medications   Current Outpatient Rx  Name Route Sig Dispense Refill  . HYDRALAZINE HCL 50 MG PO TABS Oral Take 50 mg by mouth 2 (two) times daily.    Marland Kitchen HYDROCHLOROTHIAZIDE 25 MG PO TABS Oral Take 25 mg by mouth daily.    Marland Kitchen LOSARTAN POTASSIUM 100 MG PO TABS Oral Take 100 mg by mouth 2 (two) times daily.    Marland Kitchen METOPROLOL SUCCINATE ER 50 MG PO TB24 Oral Take 25 mg by mouth daily. Take with or immediately following a meal.    . ACETAMINOPHEN 325 MG PO TABS Oral Take 2 tablets (650 mg total) by mouth every 4 (four) hours as needed.    . CEPHALEXIN 500 MG PO CAPS Oral Take 2 capsules (1,000 mg total) by mouth 2 (two) times daily. 40 capsule 0  . PANTOPRAZOLE SODIUM 40 MG PO TBEC Oral Take 1 tablet (40 mg total) by mouth daily. 30  tablet 3    BP 126/42  Pulse 63  Temp(Src) 97.3 F (36.3 C) (Axillary)  Resp 16  Ht 5\' 7"  (1.702 m)  Wt 144 lb 15.9 oz (65.77 kg)  BMI 22.71 kg/m2  SpO2 97%  Physical Exam  Nursing note and vitals reviewed. Constitutional: He is oriented to person, place, and time. He appears well-developed and well-nourished. No distress.  HENT:  Head: Normocephalic and atraumatic.  Eyes: Pupils are equal, round, and reactive to light.  Neck: Normal range of motion.  Cardiovascular: Normal rate and intact distal pulses.        Normal sinus rhythm Left ventricular hypertrophy with repolarization abnormality Anteroseptal infarct ,acute  Abnormal ECG Since last tracing rate faster  Pulmonary/Chest: No respiratory distress.  Abdominal: Normal appearance. He exhibits no distension.  Musculoskeletal: Normal range of motion.  Neurological: He is alert and oriented to person, place, and time. No cranial nerve deficit.  Skin: Skin is warm and dry. No rash noted.  Psychiatric: He has a normal mood and affect. His behavior is normal.    ED Course  Procedures (including critical care time)  Labs Reviewed  COMPREHENSIVE METABOLIC PANEL -  Abnormal; Notable for the following:    Glucose, Bld 109 (*)    Albumin 3.3 (*)    All other components within normal limits  POCT I-STAT, CHEM 8 - Abnormal; Notable for the following:    Glucose, Bld 107 (*)    All other components within normal limits  D-DIMER, QUANTITATIVE - Abnormal; Notable for the following:    D-Dimer, Quant 1.52 (*)    All other components within normal limits  HEPARIN LEVEL (UNFRACTIONATED) - Abnormal; Notable for the following:    Heparin Unfractionated 0.11 (*)    All other components within normal limits  BASIC METABOLIC PANEL - Abnormal; Notable for the following:    Potassium 3.4 (*)    Glucose, Bld 136 (*)    GFR calc non Af Amer 88 (*)    All other components within normal limits  HEPARIN LEVEL (UNFRACTIONATED) - Abnormal;  Notable for the following:    Heparin Unfractionated 0.18 (*)    All other components within normal limits  TROPONIN I  CBC  DIFFERENTIAL  PROTIME-INR  MRSA PCR SCREENING  POCT ACTIVATED CLOTTING TIME  POCT ACTIVATED CLOTTING TIME  CBC  LAB REPORT - SCANNED   No results found.   1. STEMI (ST elevation myocardial infarction) - non-obstructive CAD by Cath on admission       MDM   CRITICAL CARE Performed by: Nelva Nay L   Total critical care time:30 min  Critical care time was exclusive of separately billable procedures and treating other patients.  Critical care was necessary to treat or prevent imminent or life-threatening deterioration.  Critical care was time spent personally by me on the following activities: development of treatment plan with patient and/or surrogate as well as nursing, discussions with consultants, evaluation of patient's response to treatment, examination of patient, obtaining history from patient or surrogate, ordering and performing treatments and interventions, ordering and review of laboratory studies, ordering and review of radiographic studies, pulse oximetry and re-evaluation of patient's condition.        Nelia Shi, MD 05/07/11 1610  Nelia Shi, MD 05/07/11 2152

## 2011-05-10 DIAGNOSIS — Z79899 Other long term (current) drug therapy: Secondary | ICD-10-CM | POA: Diagnosis not present

## 2011-05-10 DIAGNOSIS — R5381 Other malaise: Secondary | ICD-10-CM | POA: Diagnosis not present

## 2011-05-10 DIAGNOSIS — E782 Mixed hyperlipidemia: Secondary | ICD-10-CM | POA: Diagnosis not present

## 2011-05-19 DIAGNOSIS — I2510 Atherosclerotic heart disease of native coronary artery without angina pectoris: Secondary | ICD-10-CM | POA: Diagnosis not present

## 2011-05-19 DIAGNOSIS — I10 Essential (primary) hypertension: Secondary | ICD-10-CM | POA: Diagnosis not present

## 2011-05-19 DIAGNOSIS — E782 Mixed hyperlipidemia: Secondary | ICD-10-CM | POA: Diagnosis not present

## 2011-05-22 DIAGNOSIS — J040 Acute laryngitis: Secondary | ICD-10-CM | POA: Diagnosis not present

## 2011-05-26 DIAGNOSIS — I10 Essential (primary) hypertension: Secondary | ICD-10-CM | POA: Diagnosis not present

## 2011-05-30 DIAGNOSIS — I6529 Occlusion and stenosis of unspecified carotid artery: Secondary | ICD-10-CM | POA: Diagnosis not present

## 2011-06-23 DIAGNOSIS — I10 Essential (primary) hypertension: Secondary | ICD-10-CM | POA: Diagnosis not present

## 2011-08-22 DIAGNOSIS — Z125 Encounter for screening for malignant neoplasm of prostate: Secondary | ICD-10-CM | POA: Diagnosis not present

## 2011-08-22 DIAGNOSIS — R351 Nocturia: Secondary | ICD-10-CM | POA: Diagnosis not present

## 2011-09-18 ENCOUNTER — Ambulatory Visit: Payer: Medicare Other

## 2011-09-18 ENCOUNTER — Ambulatory Visit (INDEPENDENT_AMBULATORY_CARE_PROVIDER_SITE_OTHER): Payer: Medicare Other | Admitting: Emergency Medicine

## 2011-09-18 VITALS — BP 133/59 | HR 64 | Temp 97.9°F | Resp 20 | Ht 66.5 in | Wt 156.6 lb

## 2011-09-18 DIAGNOSIS — J438 Other emphysema: Secondary | ICD-10-CM

## 2011-09-18 DIAGNOSIS — R0602 Shortness of breath: Secondary | ICD-10-CM | POA: Diagnosis not present

## 2011-09-18 DIAGNOSIS — J439 Emphysema, unspecified: Secondary | ICD-10-CM

## 2011-09-18 MED ORDER — NAPROXEN SODIUM 275 MG PO TABS: 275.0000 mg | ORAL_TABLET | Freq: Three times a day (TID) | ORAL | Status: DC

## 2011-09-18 MED ORDER — ALBUTEROL SULFATE HFA 108 (90 BASE) MCG/ACT IN AERS: 2.0000 | INHALATION_SPRAY | RESPIRATORY_TRACT | Status: DC | PRN

## 2011-09-18 NOTE — Progress Notes (Signed)
Date:  09/18/2011   Name:  Gary [name]   DOB:  October 03, 1935   MRN:  [id number] Gender: male  Age: 76 y.o.  PCP:  No primary provider on file.    Chief Complaint: Shoulder Pain   History of Present Illness:  Gary [name] is a 76 y.o. pleasant patient who presents with the following:  Multiple complaints.  Said that he fell 2 weeks ago and 'skinned up' his back and has pain in his right shoulder and upper arm since he woke up yesterday.  No history of injury or overuse.  No numbness or weakness or neuro symptoms..  Has severe exertional dyspnea and shortness of breath when he speaks and has to rest between phrases.  No chest pain or cough or sputum production.  Feels as though his right axilla is swollen and has a mass  Patient Active Problem List  Diagnosis  . STEMI (ST elevation myocardial infarction) - non-obstructive CAD by Cath on admission  . PVD, LFBPG 2004, dopplers OK 8/12  . HTN   . Dyslipidemia  . GERD (gastroesophageal reflux disease)  . ETOH abuse    Past Medical History  Diagnosis Date  . COPD (chronic obstructive pulmonary disease)   . Hypertension   . PVD (peripheral vascular disease) 04/27/2011    Normal Renal & abdominal Aortography; s/p Left Fem-Pop BPG 2004.  Marland Kitchen Dyslipidemia   . Coronary artery disease 04/27/2011    Low risk Myoview in 2010:Non-obstructive disase, except for small OMB 95% ostial lesion    Past Surgical History  Procedure Date  . Foot surgery   . Nasal sinus surgery   . Femoral-popliteal bypass graft 2004    Left sided    History  Substance Use Topics  . Smoking status: Former Smoker -- 1.0 packs/day    Types: Cigarettes  . Smokeless tobacco: Former Neurosurgeon    Quit date: 04/27/1998  . Alcohol Use: 1.2 oz/week    2 Cans of beer per week    No family history on file.  Allergies  Allergen Reactions  . Contrast Media (Iodinated Diagnostic Agents) Itching  . Neomycin   . Neosporin (Neomycin-Bacitracin Zn-Polymyx)      Medication list has been reviewed and updated.  Current Outpatient Prescriptions on File Prior to Visit  Medication Sig Dispense Refill  . acetaminophen (TYLENOL) 325 MG tablet Take 2 tablets (650 mg total) by mouth every 4 (four) hours as needed.      Marland Kitchen amLODipine (NORVASC) 2.5 MG tablet Take 2.5 mg by mouth at bedtime.      . Choline Fenofibrate (TRILIPIX) 135 MG capsule Take 135 mg by mouth daily.      . hydrALAZINE (APRESOLINE) 50 MG tablet Take 50 mg by mouth 2 (two) times daily.      . hydrochlorothiazide (HYDRODIURIL) 25 MG tablet Take 25 mg by mouth daily.      Marland Kitchen omeprazole (PRILOSEC) 20 MG capsule Take 20 mg by mouth daily.      . rosuvastatin (CRESTOR) 20 MG tablet Take 20 mg by mouth daily.      . valsartan (DIOVAN) 160 MG tablet Take 160 mg by mouth daily.      Marland Kitchen losartan (COZAAR) 100 MG tablet Take 100 mg by mouth 2 (two) times daily.      . metoprolol succinate (TOPROL-XL) 50 MG 24 hr tablet Take 25 mg by mouth daily. Take with or immediately following a meal.      . pantoprazole (PROTONIX) 40 MG  tablet Take 1 tablet (40 mg total) by mouth daily.  30 tablet  3    Review of Systems:  As per HPI, otherwise negative.    Physical Examination: Filed Vitals:   09/18/11 0959  BP: 133/59  Pulse: 64  Temp: 97.9 F (36.6 C)  Resp: 20   Filed Vitals:   09/18/11 0959  Height: 5' 6.5" (1.689 m)  Weight: 156 lb 9.6 oz (71.033 kg)   Body mass index is 24.90 kg/(m^2). Ideal Body Weight: Weight in (lb) to have BMI = 25: 156.9   GEN: WDWN, NAD, Non-toxic, A & O x 3 HEENT: Atraumatic, Normocephalic. Neck supple. No masses, No LAD. Ears and Nose: No external deformity. CV: RRR, No M/G/R. No JVD. No thrill. No extra heart sounds. PULM: CTA B, no wheezes, crackles, rhonchi. No retractions. No resp. distress. Recruiting and poor air movement ABD: S, NT, ND, +BS. No rebound. No HSM. EXTR: No c/c/e.  No tenderness in shoulder or upper arm.  No axillary mass or tenderness.   Free painless passive ROM NEURO Normal gait. Neuro uppers intact  PSYCH: Normally interactive. Conversant. Not depressed or anxious appearing.  Calm demeanor.    Assessment and Plan: Emphysema Pulmonary consult Arm and shoulder pain right arm Anaprox Follow up as needed  Carmelina Dane, MD [facility] reading (PRIMARY) by  Dr. Dareen Piano.  Emphysema.  No acute process.

## 2011-09-22 DIAGNOSIS — E782 Mixed hyperlipidemia: Secondary | ICD-10-CM | POA: Diagnosis not present

## 2011-09-22 DIAGNOSIS — R6889 Other general symptoms and signs: Secondary | ICD-10-CM | POA: Diagnosis not present

## 2011-09-22 DIAGNOSIS — I10 Essential (primary) hypertension: Secondary | ICD-10-CM | POA: Diagnosis not present

## 2011-09-22 DIAGNOSIS — I70219 Atherosclerosis of native arteries of extremities with intermittent claudication, unspecified extremity: Secondary | ICD-10-CM | POA: Diagnosis not present

## 2011-09-22 DIAGNOSIS — I701 Atherosclerosis of renal artery: Secondary | ICD-10-CM | POA: Diagnosis not present

## 2011-09-22 DIAGNOSIS — I739 Peripheral vascular disease, unspecified: Secondary | ICD-10-CM | POA: Diagnosis not present

## 2011-09-26 DIAGNOSIS — I739 Peripheral vascular disease, unspecified: Secondary | ICD-10-CM | POA: Diagnosis not present

## 2011-09-26 DIAGNOSIS — I10 Essential (primary) hypertension: Secondary | ICD-10-CM | POA: Diagnosis not present

## 2011-09-26 DIAGNOSIS — I2510 Atherosclerotic heart disease of native coronary artery without angina pectoris: Secondary | ICD-10-CM | POA: Diagnosis not present

## 2011-10-12 ENCOUNTER — Encounter: Payer: Self-pay | Admitting: Vascular Surgery

## 2011-11-02 DIAGNOSIS — Z23 Encounter for immunization: Secondary | ICD-10-CM | POA: Diagnosis not present

## 2012-01-11 DIAGNOSIS — J0100 Acute maxillary sinusitis, unspecified: Secondary | ICD-10-CM | POA: Diagnosis not present

## 2012-01-11 DIAGNOSIS — H6120 Impacted cerumen, unspecified ear: Secondary | ICD-10-CM | POA: Diagnosis not present

## 2012-01-25 DIAGNOSIS — H60399 Other infective otitis externa, unspecified ear: Secondary | ICD-10-CM | POA: Diagnosis not present

## 2012-02-24 ENCOUNTER — Observation Stay (HOSPITAL_COMMUNITY)
Admission: EM | Admit: 2012-02-24 | Discharge: 2012-02-26 | Disposition: A | Discharge status: Home / Self Care | Payer: Medicare Other | Attending: Internal Medicine | Admitting: Internal Medicine

## 2012-02-24 ENCOUNTER — Encounter (HOSPITAL_COMMUNITY): Payer: Self-pay | Admitting: *Deleted

## 2012-02-24 DIAGNOSIS — I10 Essential (primary) hypertension: Secondary | ICD-10-CM | POA: Diagnosis present

## 2012-02-24 DIAGNOSIS — I213 ST elevation (STEMI) myocardial infarction of unspecified site: Secondary | ICD-10-CM

## 2012-02-24 DIAGNOSIS — F1010 Alcohol abuse, uncomplicated: Secondary | ICD-10-CM | POA: Diagnosis present

## 2012-02-24 DIAGNOSIS — W010XXA Fall on same level from slipping, tripping and stumbling without subsequent striking against object, initial encounter: Secondary | ICD-10-CM | POA: Diagnosis present

## 2012-02-24 DIAGNOSIS — S2232XA Fracture of one rib, left side, initial encounter for closed fracture: Secondary | ICD-10-CM

## 2012-02-24 DIAGNOSIS — S298XXA Other specified injuries of thorax, initial encounter: Secondary | ICD-10-CM | POA: Diagnosis not present

## 2012-02-24 DIAGNOSIS — R0600 Dyspnea, unspecified: Secondary | ICD-10-CM

## 2012-02-24 DIAGNOSIS — Y92009 Unspecified place in unspecified non-institutional (private) residence as the place of occurrence of the external cause: Secondary | ICD-10-CM | POA: Insufficient documentation

## 2012-02-24 DIAGNOSIS — I2510 Atherosclerotic heart disease of native coronary artery without angina pectoris: Secondary | ICD-10-CM | POA: Insufficient documentation

## 2012-02-24 DIAGNOSIS — R079 Chest pain, unspecified: Secondary | ICD-10-CM | POA: Insufficient documentation

## 2012-02-24 DIAGNOSIS — K219 Gastro-esophageal reflux disease without esophagitis: Secondary | ICD-10-CM | POA: Diagnosis present

## 2012-02-24 DIAGNOSIS — E871 Hypo-osmolality and hyponatremia: Secondary | ICD-10-CM | POA: Diagnosis present

## 2012-02-24 DIAGNOSIS — Z79899 Other long term (current) drug therapy: Secondary | ICD-10-CM | POA: Insufficient documentation

## 2012-02-24 DIAGNOSIS — S2239XA Fracture of one rib, unspecified side, initial encounter for closed fracture: Secondary | ICD-10-CM | POA: Diagnosis not present

## 2012-02-24 DIAGNOSIS — R0602 Shortness of breath: Secondary | ICD-10-CM | POA: Diagnosis not present

## 2012-02-24 DIAGNOSIS — W19XXXA Unspecified fall, initial encounter: Secondary | ICD-10-CM

## 2012-02-24 DIAGNOSIS — E785 Hyperlipidemia, unspecified: Secondary | ICD-10-CM | POA: Diagnosis present

## 2012-02-24 DIAGNOSIS — J4489 Other specified chronic obstructive pulmonary disease: Secondary | ICD-10-CM | POA: Insufficient documentation

## 2012-02-24 DIAGNOSIS — J449 Chronic obstructive pulmonary disease, unspecified: Secondary | ICD-10-CM | POA: Insufficient documentation

## 2012-02-24 DIAGNOSIS — I739 Peripheral vascular disease, unspecified: Secondary | ICD-10-CM

## 2012-02-24 MED ORDER — FENTANYL CITRATE 0.05 MG/ML IJ SOLN
50.0000 ug | Freq: Once | INTRAMUSCULAR | Status: AC
  Administered 2012-02-25: 50 ug via INTRAVENOUS
  Filled 2012-02-24: qty 2

## 2012-02-24 NOTE — ED Notes (Signed)
Pt says he fell in his kitchen against the counter, c/o pain in his left back and pain with breathing.

## 2012-02-25 ENCOUNTER — Emergency Department (HOSPITAL_COMMUNITY): Payer: Medicare Other

## 2012-02-25 ENCOUNTER — Encounter (HOSPITAL_COMMUNITY): Payer: Self-pay | Admitting: Internal Medicine

## 2012-02-25 DIAGNOSIS — R079 Chest pain, unspecified: Secondary | ICD-10-CM | POA: Diagnosis not present

## 2012-02-25 DIAGNOSIS — W19XXXA Unspecified fall, initial encounter: Secondary | ICD-10-CM

## 2012-02-25 DIAGNOSIS — F1010 Alcohol abuse, uncomplicated: Secondary | ICD-10-CM

## 2012-02-25 DIAGNOSIS — I10 Essential (primary) hypertension: Secondary | ICD-10-CM

## 2012-02-25 DIAGNOSIS — E871 Hypo-osmolality and hyponatremia: Secondary | ICD-10-CM | POA: Diagnosis present

## 2012-02-25 DIAGNOSIS — R6889 Other general symptoms and signs: Secondary | ICD-10-CM

## 2012-02-25 DIAGNOSIS — E785 Hyperlipidemia, unspecified: Secondary | ICD-10-CM

## 2012-02-25 DIAGNOSIS — W010XXA Fall on same level from slipping, tripping and stumbling without subsequent striking against object, initial encounter: Secondary | ICD-10-CM | POA: Diagnosis present

## 2012-02-25 DIAGNOSIS — S298XXA Other specified injuries of thorax, initial encounter: Secondary | ICD-10-CM | POA: Diagnosis not present

## 2012-02-25 DIAGNOSIS — S2239XA Fracture of one rib, unspecified side, initial encounter for closed fracture: Secondary | ICD-10-CM | POA: Diagnosis present

## 2012-02-25 LAB — BASIC METABOLIC PANEL
CO2: 27 mEq/L (ref 19–32)
Chloride: 87 mEq/L — ABNORMAL LOW (ref 96–112)
Chloride: 91 mEq/L — ABNORMAL LOW (ref 96–112)
GFR calc Af Amer: >90 mL/min (ref >90)
Potassium: 3.1 mEq/L — ABNORMAL LOW (ref 3.5–5.1)
Sodium: 127 mEq/L — ABNORMAL LOW (ref 135–145)
Sodium: 131 mEq/L — ABNORMAL LOW (ref 135–145)

## 2012-02-25 LAB — PROTIME-INR
INR: 0.84 (ref 0.00–1.49)
Prothrombin Time: 11.5 seconds — ABNORMAL LOW (ref 11.6–15.2)

## 2012-02-25 LAB — HEPATIC FUNCTION PANEL
ALT: 26 U/L (ref 0–53)
Alkaline Phosphatase: 58 U/L (ref 39–117)
Bilirubin, Direct: <0.1 mg/dL (ref 0.0–0.3)

## 2012-02-25 LAB — CBC
HCT: 44.1 % (ref 39.0–52.0)
MCV: 90.7 fL (ref 78.0–100.0)
RBC: 4.86 MIL/uL (ref 4.22–5.81)
WBC: 7 10*3/uL (ref 4.0–10.5)

## 2012-02-25 LAB — CBC WITH DIFFERENTIAL/PLATELET
Basophils Absolute: 0 10*3/uL (ref 0.0–0.1)
Basophils Relative: 0 % (ref 0–1)
MCHC: 35.9 g/dL (ref 30.0–36.0)
Neutro Abs: 6.5 10*3/uL (ref 1.7–7.7)
Neutrophils Relative %: 69 % (ref 43–77)
RDW: 13.1 % (ref 11.5–15.5)

## 2012-02-25 MED ORDER — HYDRALAZINE HCL 50 MG PO TABS
50.0000 mg | ORAL_TABLET | Freq: Three times a day (TID) | ORAL | Status: DC
  Administered 2012-02-25 – 2012-02-26 (×3): 50 mg via ORAL
  Filled 2012-02-25 (×6): qty 1

## 2012-02-25 MED ORDER — PROBIOTIC DAILY PO CAPS: 1.0000 | ORAL_CAPSULE | ORAL | Status: DC

## 2012-02-25 MED ORDER — ACETAMINOPHEN 325 MG PO TABS
650.0000 mg | ORAL_TABLET | Freq: Four times a day (QID) | ORAL | Status: DC | PRN
  Administered 2012-02-26: 650 mg via ORAL
  Filled 2012-02-25: qty 2

## 2012-02-25 MED ORDER — OMEGA-3-ACID ETHYL ESTERS 1 G PO CAPS
1.0000 g | ORAL_CAPSULE | Freq: Every day | ORAL | Status: DC
  Administered 2012-02-25 – 2012-02-26 (×2): 1 g via ORAL
  Filled 2012-02-25 (×2): qty 1

## 2012-02-25 MED ORDER — ONDANSETRON HCL 4 MG PO TABS: 4.0000 mg | ORAL_TABLET | Freq: Four times a day (QID) | ORAL | Status: DC | PRN

## 2012-02-25 MED ORDER — HYDRALAZINE HCL 20 MG/ML IJ SOLN
10.0000 mg | INTRAMUSCULAR | Status: DC | PRN
  Administered 2012-02-26: 10 mg via INTRAVENOUS
  Filled 2012-02-25: qty 1

## 2012-02-25 MED ORDER — ALBUTEROL SULFATE HFA 108 (90 BASE) MCG/ACT IN AERS
2.0000 | INHALATION_SPRAY | RESPIRATORY_TRACT | Status: DC | PRN
  Filled 2012-02-25: qty 6.7

## 2012-02-25 MED ORDER — RISAQUAD PO CAPS
1.0000 | ORAL_CAPSULE | Freq: Every day | ORAL | Status: DC
  Administered 2012-02-25 – 2012-02-26 (×2): 1 via ORAL
  Filled 2012-02-25 (×2): qty 1

## 2012-02-25 MED ORDER — ENOXAPARIN SODIUM 40 MG/0.4ML ~~LOC~~ SOLN
40.0000 mg | SUBCUTANEOUS | Status: DC
  Administered 2012-02-25 – 2012-02-26 (×2): 40 mg via SUBCUTANEOUS
  Filled 2012-02-25 (×2): qty 0.4

## 2012-02-25 MED ORDER — POTASSIUM CHLORIDE CRYS ER 20 MEQ PO TBCR
40.0000 meq | EXTENDED_RELEASE_TABLET | Freq: Two times a day (BID) | ORAL | Status: AC
  Administered 2012-02-25 (×2): 40 meq via ORAL
  Filled 2012-02-25 (×2): qty 2

## 2012-02-25 MED ORDER — OXYCODONE HCL 5 MG PO TABS
5.0000 mg | ORAL_TABLET | ORAL | Status: DC | PRN
  Administered 2012-02-25: 5 mg via ORAL
  Administered 2012-02-26 (×2): 10 mg via ORAL
  Filled 2012-02-25: qty 1
  Filled 2012-02-25 (×2): qty 2

## 2012-02-25 MED ORDER — SODIUM CHLORIDE 0.9 % IV SOLN
INTRAVENOUS | Status: DC
  Administered 2012-02-25 – 2012-02-26 (×3): via INTRAVENOUS

## 2012-02-25 MED ORDER — LORAZEPAM 2 MG/ML IJ SOLN
0.0000 mg | Freq: Four times a day (QID) | INTRAMUSCULAR | Status: DC
  Administered 2012-02-25 (×2): 1 mg via INTRAVENOUS
  Administered 2012-02-26: 2 mg via INTRAVENOUS
  Filled 2012-02-25 (×3): qty 1

## 2012-02-25 MED ORDER — LORAZEPAM 2 MG/ML IJ SOLN: 0.0000 mg | Freq: Two times a day (BID) | INTRAMUSCULAR | Status: DC

## 2012-02-25 MED ORDER — SODIUM CHLORIDE 0.9 % IV SOLN
INTRAVENOUS | Status: DC
  Administered 2012-02-25: 05:00:00 via INTRAVENOUS

## 2012-02-25 MED ORDER — HYDRALAZINE HCL 50 MG PO TABS
50.0000 mg | ORAL_TABLET | Freq: Two times a day (BID) | ORAL | Status: DC
  Administered 2012-02-25: 50 mg via ORAL
  Filled 2012-02-25 (×2): qty 1

## 2012-02-25 MED ORDER — FENTANYL CITRATE 0.05 MG/ML IJ SOLN: 50.0000 ug | Freq: Once | INTRAMUSCULAR | Status: DC

## 2012-02-25 MED ORDER — VITAMIN B-1 100 MG PO TABS
100.0000 mg | ORAL_TABLET | Freq: Every day | ORAL | Status: DC
  Administered 2012-02-25 – 2012-02-26 (×2): 100 mg via ORAL
  Filled 2012-02-25 (×2): qty 1

## 2012-02-25 MED ORDER — FOLIC ACID 1 MG PO TABS
1.0000 mg | ORAL_TABLET | Freq: Every day | ORAL | Status: DC
  Administered 2012-02-25 – 2012-02-26 (×2): 1 mg via ORAL
  Filled 2012-02-25 (×2): qty 1

## 2012-02-25 MED ORDER — HYDROMORPHONE HCL PF 1 MG/ML IJ SOLN
0.5000 mg | INTRAMUSCULAR | Status: DC | PRN
  Administered 2012-02-25: 1 mg via INTRAVENOUS
  Filled 2012-02-25: qty 1

## 2012-02-25 MED ORDER — DOCUSATE SODIUM 100 MG PO CAPS
100.0000 mg | ORAL_CAPSULE | Freq: Two times a day (BID) | ORAL | Status: DC
  Administered 2012-02-25 – 2012-02-26 (×3): 100 mg via ORAL
  Filled 2012-02-25 (×4): qty 1

## 2012-02-25 MED ORDER — FENOFIBRATE 160 MG PO TABS
160.0000 mg | ORAL_TABLET | Freq: Every day | ORAL | Status: DC
  Administered 2012-02-25 – 2012-02-26 (×2): 160 mg via ORAL
  Filled 2012-02-25 (×2): qty 1

## 2012-02-25 MED ORDER — METOPROLOL SUCCINATE ER 25 MG PO TB24
25.0000 mg | ORAL_TABLET | Freq: Every day | ORAL | Status: DC
  Administered 2012-02-25 – 2012-02-26 (×2): 25 mg via ORAL
  Filled 2012-02-25 (×2): qty 1

## 2012-02-25 MED ORDER — OMEGA-3 FATTY ACIDS 1000 MG PO CAPS: 2.0000 g | ORAL_CAPSULE | Freq: Every day | ORAL | Status: DC

## 2012-02-25 MED ORDER — SODIUM CHLORIDE 0.9 % IV SOLN: INTRAVENOUS | Status: DC

## 2012-02-25 MED ORDER — AMLODIPINE BESYLATE 2.5 MG PO TABS
2.5000 mg | ORAL_TABLET | Freq: Every day | ORAL | Status: DC
  Filled 2012-02-25: qty 1

## 2012-02-25 MED ORDER — ONDANSETRON HCL 4 MG/2ML IJ SOLN: 4.0000 mg | Freq: Four times a day (QID) | INTRAMUSCULAR | Status: DC | PRN

## 2012-02-25 MED ORDER — ALUM & MAG HYDROXIDE-SIMETH 200-200-20 MG/5ML PO SUSP: 30.0000 mL | Freq: Four times a day (QID) | ORAL | Status: DC | PRN

## 2012-02-25 MED ORDER — ZOLPIDEM TARTRATE 5 MG PO TABS: 5.0000 mg | ORAL_TABLET | Freq: Every evening | ORAL | Status: DC | PRN

## 2012-02-25 MED ORDER — FENTANYL CITRATE 0.05 MG/ML IJ SOLN
50.0000 ug | Freq: Once | INTRAMUSCULAR | Status: AC
  Administered 2012-02-25: 50 ug via INTRAVENOUS
  Filled 2012-02-25: qty 2

## 2012-02-25 MED ORDER — AMLODIPINE BESYLATE 5 MG PO TABS
5.0000 mg | ORAL_TABLET | Freq: Every day | ORAL | Status: DC
  Administered 2012-02-25: 5 mg via ORAL
  Filled 2012-02-25 (×2): qty 1

## 2012-02-25 MED ORDER — ACETAMINOPHEN 650 MG RE SUPP: 650.0000 mg | Freq: Four times a day (QID) | RECTAL | Status: DC | PRN

## 2012-02-25 MED ORDER — OXYCODONE HCL 5 MG PO TABS
5.0000 mg | ORAL_TABLET | ORAL | Status: DC | PRN
  Administered 2012-02-25: 5 mg via ORAL
  Filled 2012-02-25: qty 1

## 2012-02-25 MED ORDER — PANTOPRAZOLE SODIUM 40 MG PO TBEC
40.0000 mg | DELAYED_RELEASE_TABLET | Freq: Every day | ORAL | Status: DC
  Administered 2012-02-25 – 2012-02-26 (×2): 40 mg via ORAL
  Filled 2012-02-25 (×2): qty 1

## 2012-02-25 MED ORDER — ALBUTEROL SULFATE (5 MG/ML) 0.5% IN NEBU: 2.5000 mg | INHALATION_SOLUTION | Freq: Four times a day (QID) | RESPIRATORY_TRACT | Status: DC | PRN

## 2012-02-25 MED ORDER — VITAMIN B-12 100 MCG PO TABS
100.0000 ug | ORAL_TABLET | Freq: Every day | ORAL | Status: DC
  Administered 2012-02-25 – 2012-02-26 (×2): 100 ug via ORAL
  Filled 2012-02-25 (×2): qty 1

## 2012-02-25 MED ORDER — ATORVASTATIN CALCIUM 40 MG PO TABS
40.0000 mg | ORAL_TABLET | Freq: Every day | ORAL | Status: DC
  Administered 2012-02-25: 40 mg via ORAL
  Filled 2012-02-25 (×2): qty 1

## 2012-02-25 MED ORDER — SENNA 8.6 MG PO TABS
2.0000 | ORAL_TABLET | Freq: Every day | ORAL | Status: DC
  Administered 2012-02-25: 17.2 mg via ORAL
  Filled 2012-02-25: qty 1
  Filled 2012-02-25: qty 2

## 2012-02-25 MED ORDER — SODIUM CHLORIDE 0.9 % IJ SOLN
3.0000 mL | Freq: Two times a day (BID) | INTRAMUSCULAR | Status: DC
  Administered 2012-02-25 – 2012-02-26 (×3): 3 mL via INTRAVENOUS

## 2012-02-25 NOTE — Progress Notes (Signed)
Patient with elevated BP 199/58 and HR-58, has scheduled toprol-25mg  and hydralazine-50mg  due. C/O of right rib pain and noted some restlessness. Notified Dr. Malachi Bonds of the patient BP and HR, stated to give the schedules toporol and hydralaizine, then recheck the BP in an hour. PRN ativan given. Will continue to assess patient.

## 2012-02-25 NOTE — ED Notes (Signed)
Patient transported to X-ray 

## 2012-02-25 NOTE — ED Notes (Signed)
Patient transported to CT 

## 2012-02-25 NOTE — ED Provider Notes (Signed)
History     CSN: [id number]  Arrival date & time 02/24/12  2327   First MD Initiated Contact with Patient 02/24/12 2340      Chief Complaint  Patient presents with  . Fall    (Consider location/radiation/quality/duration/timing/severity/associated sxs/prior treatment) HPI 77 yo male presents to the ER with complaint of fall, shortness of breath, and back pain.  Patient arrives from home in company of his children. He reports he became unbalanced when he turned around quickly in the kitchen and fell backwards onto a kitchen counter. He has bruising and pain to the left posterior chest wall. He reports he fell somewhere around 3:00. He is become increasingly short of breath since his fall. He has past history of COPD. He denies striking his head, no LOC. He denies any abdominal pain. No other injuries sustained in the fall.  Past Medical History  Diagnosis Date  . COPD (chronic obstructive pulmonary disease)   . Hypertension   . PVD (peripheral vascular disease) 04/27/2011    Normal Renal & abdominal Aortography; s/p Left Fem-Pop BPG 2004.  Marland Kitchen Dyslipidemia   . Coronary artery disease 04/27/2011    Low risk Myoview in 2010:Non-obstructive disase, except for small OMB 95% ostial lesion    Past Surgical History  Procedure Date  . Foot surgery   . Nasal sinus surgery   . Femoral-popliteal bypass graft 2004    Left sided    No family history on file.  History  Substance Use Topics  . Smoking status: Former Smoker -- 1.0 packs/day    Types: Cigarettes  . Smokeless tobacco: Former Neurosurgeon    Quit date: 04/27/1998  . Alcohol Use: 1.2 oz/week    2 Cans of beer per week      Review of Systems  See History of Present Illness; otherwise all other systems are reviewed and negative  Allergies  Contrast media; Neomycin; and Neosporin  Home Medications   Current Outpatient Rx  Name  Route  Sig  Dispense  Refill  . ACETAMINOPHEN 325 MG PO TABS   Oral   Take 2 tablets (650 mg  total) by mouth every 4 (four) hours as needed.         . ALBUTEROL SULFATE HFA 108 (90 BASE) MCG/ACT IN AERS   Inhalation   Inhale 2 puffs into the lungs every 4 (four) hours as needed for wheezing.   1 Inhaler   12   . AMLODIPINE BESYLATE 2.5 MG PO TABS   Oral   Take 2.5 mg by mouth at bedtime.         . CHOLINE FENOFIBRATE 135 MG PO CPDR   Oral   Take 135 mg by mouth daily.         . OMEGA-3 FATTY ACIDS 1000 MG PO CAPS   Oral   Take 2 g by mouth daily.         Marland Kitchen HYDRALAZINE HCL 50 MG PO TABS   Oral   Take 50 mg by mouth 2 (two) times daily.         Marland Kitchen HYDROCHLOROTHIAZIDE 25 MG PO TABS   Oral   Take 25 mg by mouth daily.         Marland Kitchen LOSARTAN POTASSIUM 100 MG PO TABS   Oral   Take 100 mg by mouth 2 (two) times daily.         Marland Kitchen METOPROLOL SUCCINATE ER 50 MG PO TB24   Oral   Take 25 mg by mouth  daily. Take with or immediately following a meal.         . OMEPRAZOLE 20 MG PO CPDR   Oral   Take 20 mg by mouth daily.         Marland Kitchen ROSUVASTATIN CALCIUM 20 MG PO TABS   Oral   Take 20 mg by mouth daily.         Marland Kitchen VITAMIN B12 100 MCG PO TABS   Oral   Take 100 mcg by mouth daily.           BP 173/59  Pulse 79  Temp 98 F (36.7 C)  Resp 34  SpO2 100%  Physical Exam  Nursing note and vitals reviewed. Constitutional: He is oriented to person, place, and time. He appears well-developed and well-nourished. He appears distressed (patient is tachypneic and uncomfortable appearing).  HENT:  Head: Normocephalic and atraumatic.  Nose: Nose normal.  Mouth/Throat: Oropharynx is clear and moist.  Eyes: Conjunctivae normal and EOM are normal. Pupils are equal, round, and reactive to light.  Neck: Normal range of motion. Neck supple. No JVD present. No tracheal deviation present. No thyromegaly present.  Cardiovascular: Normal rate, regular rhythm, normal heart sounds and intact distal pulses.  Exam reveals no gallop and no friction rub.   No murmur  heard. Pulmonary/Chest: Breath sounds normal. No stridor. He is in respiratory distress. He has no wheezes. He has no rales. He exhibits no tenderness.       Patient with significant tachypnea and increased work of breathing. There is bruising noted to his left posterior chest wall just below the scapula with tenderness over the area. No crepitus appreciated  Abdominal: Soft. Bowel sounds are normal. He exhibits no distension and no mass. There is no tenderness. There is no rebound and no guarding.  Musculoskeletal: Normal range of motion. He exhibits no edema and no tenderness.  Lymphadenopathy:    He has no cervical adenopathy.  Neurological: He is alert and oriented to person, place, and time. He exhibits normal muscle tone. Coordination normal.  Skin: Skin is warm and dry. No rash noted. No erythema. No pallor.  Psychiatric: He has a normal mood and affect. His behavior is normal. Judgment and thought content normal.    ED Course  Procedures (including critical care time)  Labs Reviewed  PROTIME-INR - Abnormal; Notable for the following:    Prothrombin Time 11.5 (*)     All other components within normal limits  BASIC METABOLIC PANEL - Abnormal; Notable for the following:    Sodium 127 (*)     Potassium 3.1 (*)     Chloride 87 (*)     Glucose, Bld 120 (*)     All other components within normal limits  ETHANOL - Abnormal; Notable for the following:    Alcohol, Ethyl (B) 13 (*)     All other components within normal limits  HEPATIC FUNCTION PANEL - Abnormal; Notable for the following:    Albumin 3.2 (*)     Total Bilirubin 0.2 (*)     All other components within normal limits  CBC WITH DIFFERENTIAL   Dg Chest 2 View  02/25/2012  *RADIOLOGY REPORT*  Clinical Data: 77 year old male with fall, chest pain, shortness of breath and left rib pain.  CHEST - 2 VIEW  Comparison: 09/18/2011 and prior chest radiographs  Findings: Cardiomegaly is again noted. There is no evidence of focal  airspace disease, pulmonary edema, suspicious pulmonary nodule/mass, pleural effusion, or pneumothorax. No acute bony  abnormalities are identified. Remote left upper rib fractures are identified.  IMPRESSION: Cardiomegaly without evidence of acute cardiopulmonary disease.   Original Report Authenticated By: Harmon Pier, M.D.    Ct Chest Wo Contrast  02/25/2012  *RADIOLOGY REPORT*  Clinical Data: Left rib pain after fall.  CT CHEST WITHOUT CONTRAST  Technique:  Multidetector CT imaging of the chest was performed following the standard protocol without IV contrast.  Comparison: 04/28/2011  Findings: Normal heart size.  Calcification in the aortic valve and coronary arteries.  Normal caliber thoracic aorta with calcification.  No significant lymphadenopathy in the chest. Moderate sized esophageal hiatal hernia.  The esophagus is mostly decompressed.  Visualized portions of the upper abdominal organs are unremarkable except for vascular calcifications.  No pleural effusions.  Mild interstitial changes in the lung periphery and bases suggesting fibrosis.  Mild emphysematous changes in the apices with apical scarring bilaterally.  No focal airspace consolidation.  No diffuse airspace disease.  No pneumothorax.  Normal alignment of the thoracic vertebrae without compression. Sternum appears intact.  There is an acute minimally displaced fracture of the left posterior eighth rib.  IMPRESSION: No displaced fracture of the left posterior eighth rib.  No pneumothorax.  Chronic emphysematous changes and fibrosis in the lungs.  Esophageal hiatal hernia.   Original Report Authenticated By: Burman Nieves, M.D.      1. Fall   2. ETOH abuse   3. Fracture of rib of left side   4. Dyspnea       MDM  77 year old male with fall with chest wall injury. Concern for rib fracture. He is tachypneic. Will check chest x-ray baseline labs. He is to have some additional O2 and will treat for pain.        Olivia Mackie,  MD 02/25/12 [phone]

## 2012-02-25 NOTE — H&P (Addendum)
[facility] History and Physical  Gary [name]:[id number] DOB: 1935/06/14 DOA: 02/24/2012  Referring physician: EDP PCP: [address] [facility] Specialists:   Chief Complaint: Back pain after Fall  HPI: Gary [name] is a 77 y.o. male who was in his home and fell backwards onto his kitchen counter injuring the left lid back area.   He was brought to the ED by his family.  Patient reports just losing his balance as he turned around and then he fell.    He denies syncope or any LOC.   He reports having increased falls over the past month.   In the ED, he was evaluated and found to have a closed and nondisplaced fracture of the left posterior 8th rib.   He has a history of ETOH abuse and states that he drinks 6-12 beer daily.  His laboratory studies also reveals that he has hyponatremia.      Review of Systems: The patient denies anorexia, fever, chills, sweats, weight loss, vision loss, decreased hearing, hoarseness, chest pain, syncope, dyspnea on exertion, peripheral edema, balance deficits, hemoptysis, abdominal pain, nausea, vomiting, diarrhea, hematemesis, melena, hematochezia, severe indigestion/heartburn, hematuria, dysuria, urinary retention, incontinence, genital sores, muscle weakness, suspicious skin lesions, transient blindness, depression, unusual weight change, abnormal bleeding, enlarged lymph nodes, angioedema, and breast masses.    Past Medical History  Diagnosis Date  . COPD (chronic obstructive pulmonary disease)   . Hypertension   . PVD (peripheral vascular disease) 04/27/2011    Normal Renal & abdominal Aortography; s/p Left Fem-Pop BPG 2004.  Marland Kitchen Dyslipidemia   . Coronary artery disease 04/27/2011    Low risk Myoview in 2010:Non-obstructive disase, except for small OMB 95% ostial lesion   Past Surgical History  Procedure Date  . Foot surgery   . Nasal sinus surgery   . Femoral-popliteal bypass graft 2004    Left sided     Medications:  HOME  MEDS: Prior to Admission medications   Medication Sig Start Date End Date Taking? Authorizing Provider  amLODipine (NORVASC) 2.5 MG tablet Take 2.5 mg by mouth at bedtime.   Yes Historical Provider, MD  Choline Fenofibrate (TRILIPIX) 135 MG capsule Take 135 mg by mouth daily.   Yes Historical Provider, MD  fish oil-omega-3 fatty acids 1000 MG capsule Take 2 g by mouth daily.   Yes Historical Provider, MD  hydrochlorothiazide (HYDRODIURIL) 25 MG tablet Take 25 mg by mouth daily.   Yes Historical Provider, MD  omeprazole (PRILOSEC) 20 MG capsule Take 20 mg by mouth daily.   Yes Historical Provider, MD  Probiotic Product (PROBIOTIC DAILY PO) Take 1 capsule by mouth daily.   Yes Historical Provider, MD  valsartan-hydrochlorothiazide (DIOVAN-HCT) 320-25 MG per tablet Take 1 tablet by mouth daily.   Yes Historical Provider, MD  vitamin B-12 (CYANOCOBALAMIN) 100 MCG tablet Take 100 mcg by mouth daily.   Yes Historical Provider, MD  acetaminophen (TYLENOL) 325 MG tablet Take 2 tablets (650 mg total) by mouth every 4 (four) hours as needed. 04/28/11 04/27/12  Abelino Derrick, PA  albuterol (PROVENTIL HFA;VENTOLIN HFA) 108 (90 BASE) MCG/ACT inhaler Inhale 2 puffs into the lungs every 4 (four) hours as needed for wheezing. 09/18/11 09/17/12  Phillips Odor, MD  hydrALAZINE (APRESOLINE) 50 MG tablet Take 50 mg by mouth 2 (two) times daily.    Historical Provider, MD  metoprolol succinate (TOPROL-XL) 50 MG 24 hr tablet Take 25 mg by mouth daily. Take with or immediately following a meal.    Historical  Provider, MD  rosuvastatin (CRESTOR) 20 MG tablet Take 20 mg by mouth daily.    Historical Provider, MD    Allergies:  Allergies  Allergen Reactions  . Contrast Media (Iodinated Diagnostic Agents) Itching  . Neomycin   . Neosporin (Neomycin-Bacitracin Zn-Polymyx)     Social History:   reports that he has quit smoking. His smoking use included Cigarettes. He smoked 1 pack per day. He quit smokeless tobacco use  about 13 years ago. He reports that he drinks about 1.2 ounces of alcohol per week. He reports that he does not use illicit drugs.  Family History: Family History  Problem Relation Age of Onset  . CAD Brother   . Hypertension Father   . Hypertension Brother      X 2  . Cancer - Other Mother     Kidney     Physical Exam:  GEN:  Pleasant 77 year old thin Elderly Caucasian male examined  and in no acute distress; cooperative with exam Filed Vitals:   02/24/12 2342 02/25/12 0007 02/25/12 0212 02/25/12 0400  BP: 198/67 173/59 185/58   Pulse: 78 79 72 72  Temp: 98 F (36.7 C)     Resp: 22 34 34 30  SpO2: 97% 100% 99% 95%   Blood pressure 185/58, pulse 72, temperature 98 F (36.7 C), resp. rate 30, SpO2 95.00%. PSYCH: He is alert and oriented x4; does not appear anxious does not appear depressed; affect is normal HEENT: Normocephalic and Atraumatic, Mucous membranes pink; PERRLA; EOM intact; Fundi:  Benign;  No scleral icterus, Nares: Patent, Oropharynx: Clear, Edentulous, Neck:  FROM, no cervical lymphadenopathy nor thyromegaly or carotid bruit; no JVD; Breasts:: Not examined CHEST WALL: No tenderness CHEST: Normal respiration, clear to auscultation bilaterally HEART: Regular rate and rhythm; no murmurs rubs or gallops BACK: No kyphosis or scoliosis; no CVA tenderness ABDOMEN: Positive Bowel Sounds, Scaphoid, soft non-tender; no masses, no organomegaly.    Rectal Exam: Not done EXTREMITIES: No cyanosis, clubbing or edema; no ulcerations. Genitalia: not examined PULSES: 2+ and symmetric SKIN: Normal hydration no rash or ulceration CNS: Cranial nerves 2-12 grossly intact no focal neurologic deficit    Labs on Admission:  Basic Metabolic Panel:  Lab 02/24/12 7829  NA 127*  K 3.1*  CL 87*  CO2 21  GLUCOSE 120*  BUN 13  CREATININE 0.60  CALCIUM 9.3  MG --  PHOS --   Liver Function Tests:  Lab 02/24/12 2354  AST 26  ALT 26  ALKPHOS 58  BILITOT 0.2*  PROT 6.5   ALBUMIN 3.2*   No results found for this basename: LIPASE:5,AMYLASE:5 in the last 168 hours No results found for this basename: AMMONIA:5 in the last 168 hours CBC:  Lab 02/24/12 2354  WBC 9.4  NEUTROABS 6.5  HGB 16.1  HCT 44.8  MCV 89.1  PLT 240   Cardiac Enzymes: No results found for this basename: CKTOTAL:5,CKMB:5,CKMBINDEX:5,TROPONINI:5 in the last 168 hours  BNP (last 3 results) No results found for this basename: PROBNP:3 in the last 8760 hours CBG: No results found for this basename: GLUCAP:5 in the last 168 hours  Radiological Exams on Admission: Dg Chest 2 View  02/25/2012  *RADIOLOGY REPORT*  Clinical Data: 77 year old male with fall, chest pain, shortness of breath and left rib pain.  CHEST - 2 VIEW  Comparison: 09/18/2011 and prior chest radiographs  Findings: Cardiomegaly is again noted. There is no evidence of focal airspace disease, pulmonary edema, suspicious pulmonary nodule/mass, pleural effusion, or  pneumothorax. No acute bony abnormalities are identified. Remote left upper rib fractures are identified.  IMPRESSION: Cardiomegaly without evidence of acute cardiopulmonary disease.   Original Report Authenticated By: Harmon Pier, M.D.    Ct Chest Wo Contrast  02/25/2012  *RADIOLOGY REPORT*  Clinical Data: Left rib pain after fall.  CT CHEST WITHOUT CONTRAST  Technique:  Multidetector CT imaging of the chest was performed following the standard protocol without IV contrast.  Comparison: 04/28/2011  Findings: Normal heart size.  Calcification in the aortic valve and coronary arteries.  Normal caliber thoracic aorta with calcification.  No significant lymphadenopathy in the chest. Moderate sized esophageal hiatal hernia.  The esophagus is mostly decompressed.  Visualized portions of the upper abdominal organs are unremarkable except for vascular calcifications.  No pleural effusions.  Mild interstitial changes in the lung periphery and bases suggesting fibrosis.  Mild  emphysematous changes in the apices with apical scarring bilaterally.  No focal airspace consolidation.  No diffuse airspace disease.  No pneumothorax.  Normal alignment of the thoracic vertebrae without compression. Sternum appears intact.  There is an acute minimally displaced fracture of the left posterior eighth rib.  IMPRESSION: No displaced fracture of the left posterior eighth rib.  No pneumothorax.  Chronic emphysematous changes and fibrosis in the lungs.  Esophageal hiatal hernia.   Original Report Authenticated By: Burman Nieves, M.D.       Assessment: Principal Problem:  *Closed rib fracture Active Problems:  Fall from other slipping, tripping, or stumbling  ETOH abuse  Hyponatremia  HTN   Dyslipidemia  GERD (gastroesophageal reflux disease)   COPD    Plan: 1.     Closed Rib Fracture-   From Fall, Non-surgical, pain control ordered 2.      Fall- Due to Weakness, unsteady Gait, or possibly due to his Drinking.  Fall Precautions and Order PT Evaluation. 3.      ETOH Abuse- CIWA Protocol. 4.      Hyponatremia-Due to ETOH (beer), Gentle Rehydration and monitor Sodium levels. 5.      Hypokalemia- due to diuretics, diuretic on Hold, replete K+, and check Magnesium level, repete PRN.    5.      HTN-Continue Home Medications, but hold diuretics while sodium is low.    6.      Dyslipidemia-Continue Home medications.  On Crestor, Trilipix, Omega-3,  7.      GERD-Continue Home Medications, on Omeprazole at home substituted protonix. 8.      COPD- stable on medications, PRN Albuterol nebs ordered.    9.      Other-Reconcile Home Medications.   10.    DVT Prophylaxis with Lovenox.             Code Status:  FULL CODE Family Communication:  Family Not present Disposition Plan:  Return to Home on Discharge  Time spent: 4 Minutes  Ron Parker [facility] Pager [phone]  If 7PM-7AM, please contact night-coverage [email or web address] Password [facility] 02/25/2012, 4:32  AM

## 2012-02-25 NOTE — ED Notes (Signed)
MD at bedside to discuss plan of care

## 2012-02-25 NOTE — ED Notes (Signed)
Gary [name] (pt daughter) [phone]

## 2012-02-25 NOTE — Progress Notes (Signed)
Utilization Review Completed.Gary [name] T1/19/2014   

## 2012-02-25 NOTE — Progress Notes (Signed)
[facility] PROGRESS NOTE  Gary [name] ZOX:[id number] DOB: 09-06-1935 DOA: 02/24/2012 PCP: No primary provider on file.  Assessment/Plan  Closed Rib Fracture- From Fall, Non-surgical, pain control ordered and pain is improvine -  Encouraged IS  2. Fall- Due to Weakness, unsteady Gait, or possibly due to his Drinking.  -  Fall Precautions  -  PT Evaluation pending 3. ETOH Abuse- CIWA Protocol -  Continue thiamine and folate and ativan prn 4. Hyponatremia-Due to ETOH (beer) - continue hydration -  Repeat electrolytes in AM  5. Hypokalemia- due to diuretics, diuretic on Hold -  Replete K+ -  f/u Magnesium level 5. HTN-very elevated BP currently even though pain in fair control -  Continue pain management -  Increase norvasc -  Increase hydralazine to TID -  Cont metop  6. Dyslipidemia-Continue Home medications. On Crestor, Trilipix, Omega-3,  7. GERD-Continue Home Medications, on Omeprazole at home substituted protonix.  8. COPD- stable on medications, PRN Albuterol nebs ordered.   Diet:  Healthy heart Access:  PIV IVF:  NS at 38ml/h Proph:  lovenox   Code Status: full code Family Communication: spoke with patient and daughter Disposition Plan: pending PT evaluation and electrolyte repletion, to home likely in AM  Consultants:  PT  Procedures:  CT chest  Antibiotics:  none   HPI/Subjective: Patient states left back pain is currently a 6/10 but denies SOB.  Can take a deep breath.  Denies N/V/D.  Has some constipation.  Denies dysuria or problems urinating.   Objective: Filed Vitals:   02/25/12 0400 02/25/12 0438 02/25/12 0440 02/25/12 0441  BP:    185/50  Pulse: 72 67    Temp:  97.3 F (36.3 C)    Resp: 30 32    Height:   5\' 8"  (1.727 m) 5\' 8"  (1.727 m)  Weight:   69.1 kg (152 lb 5.4 oz) 69.1 kg (152 lb 5.4 oz)  SpO2: 95% 99%     No intake or output data in the 24 hours ending 02/25/12 0707 Filed Weights   02/25/12 0440 02/25/12 0441    Weight: 69.1 kg (152 lb 5.4 oz) 69.1 kg (152 lb 5.4 oz)    Exam:   General:  Caucasian male, no acute distress, lying in bed  HEENT:  MMM  Cardiovascular:  RRR, 2/6 systolic murmur at the RUSB with radiation to the carotids.  No rubs or gallops, 2+ pulses, warm extremities  Respiratory:  CTAB  Abdomen:  NABS, soft mildly distended, nontender  MSK:  Normal tone and bulk.  Pain with palpation of the left posterior chest wall  Neuro:  Grossly intact, no tremor or tongue fasciculation  Data Reviewed: Basic Metabolic Panel:  Lab 02/25/12 8119 02/24/12 2354  NA 131* 127*  K 2.9* 3.1*  CL 91* 87*  CO2 27 21  GLUCOSE 112* 120*  BUN 13 13  CREATININE 0.61 0.60  CALCIUM 8.8 9.3  MG -- --  PHOS -- --   Liver Function Tests:  Lab 02/24/12 2354  AST 26  ALT 26  ALKPHOS 58  BILITOT 0.2*  PROT 6.5  ALBUMIN 3.2*   No results found for this basename: LIPASE:5,AMYLASE:5 in the last 168 hours No results found for this basename: AMMONIA:5 in the last 168 hours CBC:  Lab 02/25/12 0510 02/24/12 2354  WBC 7.0 9.4  NEUTROABS -- 6.5  HGB 15.4 16.1  HCT 44.1 44.8  MCV 90.7 89.1  PLT 197 240   Cardiac Enzymes: No results found  for this basename: CKTOTAL:5,CKMB:5,CKMBINDEX:5,TROPONINI:5 in the last 168 hours BNP (last 3 results) No results found for this basename: PROBNP:3 in the last 8760 hours CBG: No results found for this basename: GLUCAP:5 in the last 168 hours  No results found for this or any previous visit (from the past 240 hour(s)).   Studies: Dg Chest 2 View  02/25/2012  *RADIOLOGY REPORT*  Clinical Data: 77 year old male with fall, chest pain, shortness of breath and left rib pain.  CHEST - 2 VIEW  Comparison: 09/18/2011 and prior chest radiographs  Findings: Cardiomegaly is again noted. There is no evidence of focal airspace disease, pulmonary edema, suspicious pulmonary nodule/mass, pleural effusion, or pneumothorax. No acute bony abnormalities are identified.  Remote left upper rib fractures are identified.  IMPRESSION: Cardiomegaly without evidence of acute cardiopulmonary disease.   Original Report Authenticated By: Harmon Pier, M.D.    Ct Chest Wo Contrast  02/25/2012  *RADIOLOGY REPORT*  Clinical Data: Left rib pain after fall.  CT CHEST WITHOUT CONTRAST  Technique:  Multidetector CT imaging of the chest was performed following the standard protocol without IV contrast.  Comparison: 04/28/2011  Findings: Normal heart size.  Calcification in the aortic valve and coronary arteries.  Normal caliber thoracic aorta with calcification.  No significant lymphadenopathy in the chest. Moderate sized esophageal hiatal hernia.  The esophagus is mostly decompressed.  Visualized portions of the upper abdominal organs are unremarkable except for vascular calcifications.  No pleural effusions.  Mild interstitial changes in the lung periphery and bases suggesting fibrosis.  Mild emphysematous changes in the apices with apical scarring bilaterally.  No focal airspace consolidation.  No diffuse airspace disease.  No pneumothorax.  Normal alignment of the thoracic vertebrae without compression. Sternum appears intact.  There is an acute minimally displaced fracture of the left posterior eighth rib.  IMPRESSION: No displaced fracture of the left posterior eighth rib.  No pneumothorax.  Chronic emphysematous changes and fibrosis in the lungs.  Esophageal hiatal hernia.   Original Report Authenticated By: Burman Nieves, M.D.     Scheduled Meds:   . acidophilus  1 capsule Oral Daily  . amLODipine  2.5 mg Oral QHS  . atorvastatin  40 mg Oral q1800  . enoxaparin (LOVENOX) injection  40 mg Subcutaneous Q24H  . fenofibrate  160 mg Oral Daily  . hydrALAZINE  50 mg Oral BID  . LORazepam  0-4 mg Intravenous Q6H   Followed by  . LORazepam  0-4 mg Intravenous Q12H  . metoprolol succinate  25 mg Oral Daily  . omega-3 acid ethyl esters  1 g Oral Daily  . pantoprazole  40 mg Oral  Daily  . potassium chloride  40 mEq Oral BID  . sodium chloride  3 mL Intravenous Q12H  . vitamin B-12  100 mcg Oral Daily   Continuous Infusions:   . sodium chloride 75 mL/hr at 02/25/12 1610    Principal Problem:  *Closed rib fracture Active Problems:  HTN   Dyslipidemia  GERD (gastroesophageal reflux disease)  ETOH abuse  Fall from other slipping, tripping, or stumbling  Hyponatremia    Time spent: 30 min    Hayk Divis, [facility]  [facility] Pager [phone]. If 8PM-8AM, please contact night-coverage at [email or web address], password [facility] 02/25/2012, 7:07 AM  LOS: 1 day

## 2012-02-26 LAB — BASIC METABOLIC PANEL
CO2: 23 mEq/L (ref 19–32)
Calcium: 9.1 mg/dL (ref 8.4–10.5)
Chloride: 96 mEq/L (ref 96–112)
Creatinine, Ser: 0.55 mg/dL (ref 0.50–1.35)
Glucose, Bld: 114 mg/dL — ABNORMAL HIGH (ref 70–99)

## 2012-02-26 MED ORDER — AMLODIPINE BESYLATE 2.5 MG PO TABS: 5.0000 mg | ORAL_TABLET | Freq: Every day | ORAL | Status: DC

## 2012-02-26 MED ORDER — AMLODIPINE BESYLATE 10 MG PO TABS
10.0000 mg | ORAL_TABLET | Freq: Every day | ORAL | Status: DC
  Administered 2012-02-26: 10 mg via ORAL
  Filled 2012-02-26: qty 1

## 2012-02-26 MED ORDER — VALSARTAN 320 MG PO TABS: 320.0000 mg | ORAL_TABLET | Freq: Every day | ORAL | Status: DC

## 2012-02-26 MED ORDER — MAGNESIUM SULFATE 40 MG/ML IJ SOLN
2.0000 g | Freq: Once | INTRAMUSCULAR | Status: AC
  Administered 2012-02-26: 2 g via INTRAVENOUS
  Filled 2012-02-26: qty 50

## 2012-02-26 MED ORDER — OXYCODONE HCL 5 MG PO TABS: 5.0000 mg | ORAL_TABLET | ORAL | Status: DC | PRN

## 2012-02-26 MED ORDER — BISACODYL 5 MG PO TBEC
10.0000 mg | DELAYED_RELEASE_TABLET | Freq: Once | ORAL | Status: AC
  Administered 2012-02-26: 10 mg via ORAL
  Filled 2012-02-26: qty 2

## 2012-02-26 MED ORDER — FOLIC ACID 1 MG PO TABS: 1.0000 mg | ORAL_TABLET | Freq: Every day | ORAL | Status: DC

## 2012-02-26 MED ORDER — HYDRALAZINE HCL 50 MG PO TABS: 50.0000 mg | ORAL_TABLET | Freq: Three times a day (TID) | ORAL | Status: DC

## 2012-02-26 MED ORDER — BISACODYL 5 MG PO TBEC: 5.0000 mg | DELAYED_RELEASE_TABLET | Freq: Every day | ORAL | Status: DC | PRN

## 2012-02-26 MED ORDER — DSS 100 MG PO CAPS: 100.0000 mg | ORAL_CAPSULE | Freq: Two times a day (BID) | ORAL | Status: DC

## 2012-02-26 MED ORDER — THIAMINE HCL 100 MG PO TABS: 100.0000 mg | ORAL_TABLET | Freq: Every day | ORAL | Status: DC

## 2012-02-26 MED ORDER — LISINOPRIL 5 MG PO TABS
5.0000 mg | ORAL_TABLET | Freq: Every day | ORAL | Status: DC
  Administered 2012-02-26: 5 mg via ORAL
  Filled 2012-02-26: qty 1

## 2012-02-26 NOTE — Discharge Summary (Signed)
Physician Discharge Summary  Gary [name] ZOX:[id number] DOB: 06-09-1935 DOA: 02/24/2012  PCP: No primary provider on file.  Admit date: 02/24/2012 Discharge date: 02/26/2012  Recommendations for Outpatient Follow-up:  1. Home health PT and aid  Discharge Diagnoses:  Principal Problem:  *Closed rib fracture Active Problems:  HTN   Dyslipidemia  GERD (gastroesophageal reflux disease)  ETOH abuse  Fall from other slipping, tripping, or stumbling  Hyponatremia   Discharge Condition: stable, improved  Diet recommendation: healthy heart   Wt Readings from Last 3 Encounters:  02/25/12 69.1 kg (152 lb 5.4 oz)  09/18/11 71.033 kg (156 lb 9.6 oz)  05/05/11 73.029 kg (161 lb)    History of present illness:   Gary [name] is a 77 y.o. male who was in his home and fell backwards onto his kitchen counter injuring the left lid back area. He was brought to the ED by his family. Patient reports just losing his balance as he turned around and then he fell. He denies syncope or any LOC. He reports having increased falls over the past month. In the ED, he was evaluated and found to have a closed and nondisplaced fracture of the left posterior 8th rib. He has a history of ETOH abuse and states that he drinks 6-12 beer daily. His laboratory studies also reveals that he has hyponatremia.   Hospital Course:   [name] suffered a fall which caused a closed rib fracture.  He was given narcotic pain medication and encouraged to perform frequent incentive spirometry to prevent splinting and atelectasis.  He was able to breath comfortably on room air.    Fall:  He did not have focal neurologic deficits to suggest stroke.  His balance problems were attributed to his heavy alcohol use and cerebellar dysfunction. He was placed on falls precautions and CIWA protocol with thiamine and folate.  He was evaluated by PT who recommended home health aid and physical therapy.    ETOH Abuse- CIWA Protocol  was initiated and he received several doses of ativan for symptoms.  He was not interested in stopping alcohol use at this time.  He was encouraged to talk to his primary care doctor about controlled alcohol cessation.  He should continue thiamine and folate.    Hyponatremia was likely due to mild dehydration and ETOH (beer) potomania.  His electrolytes normalized with IVF.   Hypokalemia- due to diuretics (HCTZ).  His HCTZ was discontinued.  His potassium was supplemented orally and with IVF.  Magnesium level was wnl.    HTN-very elevated BP currently even though pain in fair control.  His HCTZ was discontinued because of electrolyte disturbance, however, his norvasc dose was increased and his hydralazine was changed to three times per day.  He continued his metoprolol.    6. Dyslipidemia-Continued home medications. On Crestor, Trilipix, Omega-3,  7. GERD-Continued PPI 8. COPD- stable on medications, PRN Albuterol nebs ordered.   Procedures:  CT chest   Consultations:  none  Discharge Exam: Filed Vitals:   02/26/12 1000  BP: 147/61  Pulse: 73  Temp:   Resp: 18   Filed Vitals:   02/25/12 2044 02/26/12 0216 02/26/12 0448 02/26/12 1000  BP: 171/52 194/67 186/55 147/61  Pulse: 66 73 79 73  Temp: 98 F (36.7 C) 98.4 F (36.9 C) 98 F (36.7 C)   TempSrc: Oral Oral Oral   Resp: 20 20 20 18   Height:      Weight:      SpO2: 96%  95% 96% 96%    General: Caucasian male, no acute distress, sitting up in bed HEENT: MMM  Cardiovascular: RRR, 2/6 systolic murmur at the RUSB with radiation to the carotids. No rubs or gallops, 2+ pulses, warm extremities  Respiratory: CTAB  Abdomen: NABS, soft mildly distended, nontender  MSK: Normal tone and bulk. Stable pain with palpation of the left posterior chest wall but no obvious bruising  Neuro: Grossly intact, no tremor or tongue fasciculation  Discharge Instructions      Discharge Orders    Future Orders Please Complete By Expires    Diet - low sodium heart healthy      Increase activity slowly      Discharge instructions      Comments:   You were hospitalized with a fall and rib fracture.  You may be developed balance problems and dementia from your alcohol use.  Please talk to your primary care doctor about your alcohol use.  Do not stop drinking alcohol suddenly without talking to your doctor because stopping suddenly may increase your risk of seizures and death.  Please read your discharge instructions carefully.  Rib fractures are painful and slow to heal.  Please do not operate heavy machinery while taking oxycodone or narcotic medications.  You had low potassium and salt levels and were dehydrated when you arrived.  Please stop taking your diovan and hydrochlorothiazide because they contain diuretics which make you lose fluid.  I have increased your hydralazine to three times a day, increased your norvasc to 5mg , and given you a prescription for valsartan, which is one of the medications in diovan which is safe for you to continue.  Your blood pressure has been elevated during this admission but is trending down.  Stay hydrated, take your medications as prescribed.  Follow up with your primary care doctor in one week for repeat bloodwork and blood pressure check.   Call MD for:  temperature >100.4      Call MD for:  persistant nausea and vomiting      Call MD for:  severe uncontrolled pain      Call MD for:  difficulty breathing, headache or visual disturbances      Call MD for:  hives      Call MD for:  persistant dizziness or light-headedness      Call MD for:  extreme fatigue      Driving Restrictions      Comments:   While taking narcotic medication       Medication List     As of 03/06/2012  6:30 PM    STOP taking these medications         hydrochlorothiazide 25 MG tablet   Commonly known as: HYDRODIURIL      valsartan-hydrochlorothiazide 320-25 MG per tablet   Commonly known as: DIOVAN-HCT      TAKE  these medications         acetaminophen 325 MG tablet   Commonly known as: TYLENOL   Take 2 tablets (650 mg total) by mouth every 4 (four) hours as needed.      albuterol 108 (90 BASE) MCG/ACT inhaler   Commonly known as: PROVENTIL HFA;VENTOLIN HFA   Inhale 2 puffs into the lungs every 4 (four) hours as needed for wheezing.      amLODipine 2.5 MG tablet   Commonly known as: NORVASC   Take 2 tablets (5 mg total) by mouth daily.      bisacodyl 5 MG EC tablet  Commonly known as: DULCOLAX   Take 1 tablet (5 mg total) by mouth daily as needed for constipation.      DSS 100 MG Caps   Take 100 mg by mouth 2 (two) times daily.      fish oil-omega-3 fatty acids 1000 MG capsule   Take 2 g by mouth daily.      folic acid 1 MG tablet   Commonly known as: FOLVITE   Take 1 tablet (1 mg total) by mouth daily.      hydrALAZINE 50 MG tablet   Commonly known as: APRESOLINE   Take 1 tablet (50 mg total) by mouth 3 (three) times daily.      metoprolol succinate 50 MG 24 hr tablet   Commonly known as: TOPROL-XL   Take 25 mg by mouth daily. Take with or immediately following a meal.      omeprazole 20 MG capsule   Commonly known as: PRILOSEC   Take 20 mg by mouth daily.      oxyCODONE 5 MG immediate release tablet   Commonly known as: Oxy IR/ROXICODONE   Take 1-2 tablets (5-10 mg total) by mouth every 4 (four) hours as needed.      PROBIOTIC DAILY PO   Take 1 capsule by mouth daily.      rosuvastatin 20 MG tablet   Commonly known as: CRESTOR   Take 20 mg by mouth daily.      thiamine 100 MG tablet   Take 1 tablet (100 mg total) by mouth daily.      TRILIPIX 135 MG capsule   Generic drug: Choline Fenofibrate   Take 135 mg by mouth daily.      valsartan 320 MG tablet   Commonly known as: DIOVAN   Take 1 tablet (320 mg total) by mouth daily.      vitamin B-12 100 MCG tablet   Commonly known as: CYANOCOBALAMIN   Take 100 mcg by mouth daily.        Follow-up Information     Follow up with primary care doctor. Schedule an appointment as soon as possible for a visit in 1 week. (blood pressure check)           The results of significant diagnostics from this hospitalization (including imaging, microbiology, ancillary and laboratory) are listed below for reference.    Significant Diagnostic Studies: Dg Chest 2 View  02/25/2012  *RADIOLOGY REPORT*  Clinical Data: 77 year old male with fall, chest pain, shortness of breath and left rib pain.  CHEST - 2 VIEW  Comparison: 09/18/2011 and prior chest radiographs  Findings: Cardiomegaly is again noted. There is no evidence of focal airspace disease, pulmonary edema, suspicious pulmonary nodule/mass, pleural effusion, or pneumothorax. No acute bony abnormalities are identified. Remote left upper rib fractures are identified.  IMPRESSION: Cardiomegaly without evidence of acute cardiopulmonary disease.   Original Report Authenticated By: Harmon Pier, M.D.    Ct Chest Wo Contrast  02/25/2012  *RADIOLOGY REPORT*  Clinical Data: Left rib pain after fall.  CT CHEST WITHOUT CONTRAST  Technique:  Multidetector CT imaging of the chest was performed following the standard protocol without IV contrast.  Comparison: 04/28/2011  Findings: Normal heart size.  Calcification in the aortic valve and coronary arteries.  Normal caliber thoracic aorta with calcification.  No significant lymphadenopathy in the chest. Moderate sized esophageal hiatal hernia.  The esophagus is mostly decompressed.  Visualized portions of the upper abdominal organs are unremarkable except for vascular calcifications.  No pleural  effusions.  Mild interstitial changes in the lung periphery and bases suggesting fibrosis.  Mild emphysematous changes in the apices with apical scarring bilaterally.  No focal airspace consolidation.  No diffuse airspace disease.  No pneumothorax.  Normal alignment of the thoracic vertebrae without compression. Sternum appears intact.  There is an  acute minimally displaced fracture of the left posterior eighth rib.  IMPRESSION: No displaced fracture of the left posterior eighth rib.  No pneumothorax.  Chronic emphysematous changes and fibrosis in the lungs.  Esophageal hiatal hernia.   Original Report Authenticated By: Burman Nieves, M.D.     Microbiology: No results found for this or any previous visit (from the past 240 hour(s)).   Labs: Basic Metabolic Panel: No results found for this basename: NA:5,K:5,CL:5,CO2:5,GLUCOSE:5,BUN:5,CREATININE:5,CALCIUM:5,MG:5,PHOS:5 in the last 168 hours Liver Function Tests: No results found for this basename: AST:5,ALT:5,ALKPHOS:5,BILITOT:5,PROT:5,ALBUMIN:5 in the last 168 hours No results found for this basename: LIPASE:5,AMYLASE:5 in the last 168 hours No results found for this basename: AMMONIA:5 in the last 168 hours CBC: No results found for this basename: WBC:5,NEUTROABS:5,HGB:5,HCT:5,MCV:5,PLT:5 in the last 168 hours Cardiac Enzymes: No results found for this basename: CKTOTAL:5,CKMB:5,CKMBINDEX:5,TROPONINI:5 in the last 168 hours BNP: BNP (last 3 results) No results found for this basename: PROBNP:3 in the last 8760 hours CBG: No results found for this basename: GLUCAP:5 in the last 168 hours  Time coordinating discharge: 35 minutes  Signed:  Wilmer Berryhill  [facility] 03/06/2012, 6:30 PM

## 2012-02-26 NOTE — Evaluation (Signed)
Physical Therapy Evaluation Patient Details Name: Gary [name] MRN: [id number] DOB: 07-31-1935 Today's Date: 02/26/2012 Time: 4696-2952 PT Time Calculation (min): 25 min  PT Assessment / Plan / Recommendation Clinical Impression  Pt presents with L posterior closed rib fracture after sustaining a fall at home.  Noted pt with history of ETOH abuse, CAD, and COPD.  Tolerated OOB and ambulation full length of unit with some SOB noted, however pts SaO2 throughout was in upper 90's on RA.  Pt very steady with RW and recommend that he use it at all times at home (was not using it when he fell).  Pt to D/C home today.  PT recommends HHPT and an aide at home due to son works 8-5 during day.  All further needs can  be met at home.     PT Assessment  All further PT needs can be met in the next venue of care    Follow Up Recommendations  Home health PT;Supervision - Intermittent;Other (comment) (HH aide)    Does the patient have the potential to tolerate intense rehabilitation      Barriers to Discharge        Equipment Recommendations  None recommended by PT    Recommendations for Other Services     Frequency      Precautions / Restrictions Precautions Precautions: Fall Restrictions Weight Bearing Restrictions: No   Pertinent Vitals/Pain 7/10 pain in rib cage, RN gave medicine.       Mobility  Bed Mobility Bed Mobility: Supine to Sit Supine to Sit: 6: Modified independent (Device/Increase time) Details for Bed Mobility Assistance: increased time to complete task.  Transfers Transfers: Sit to Stand;Stand to Sit Sit to Stand: With upper extremity assist;4: Min assist;From bed Stand to Sit: 4: Min guard;With upper extremity assist;With armrests;To chair/3-in-1 Details for Transfer Assistance: Assist to rise (from lower suface to simulate home bed) with cues for hand placement and safety.  Ambulation/Gait Ambulation/Gait Assistance: 4: Min guard Ambulation Distance (Feet): 500  Feet Assistive device: Rolling walker Ambulation/Gait Assistance Details: Pt very steady with RW with min cues initially for staying closer to RW with ambulation.  Recommend that pt use RW at all times at home.  Gait Pattern: Step-through pattern;Decreased stride length Gait velocity: decreased Stairs: No Wheelchair Mobility Wheelchair Mobility: No    Shoulder Instructions     Exercises     PT Diagnosis: Difficulty walking;Generalized weakness  PT Problem List: Decreased strength;Decreased activity tolerance;Decreased balance;Decreased mobility;Decreased knowledge of use of DME;Pain PT Treatment Interventions:     PT Goals    Visit Information  Last PT Received On: 02/26/12 Assistance Needed: +1    Subjective Data  Subjective: I want to get up out of this bed.  Patient Stated Goal: n/a   Prior Functioning  Home Living Lives With: Son Available Help at Discharge: Available PRN/intermittently Type of Home: House Home Access: Stairs to enter [employer] of Steps: 3 Entrance Stairs-Rails: Right Home Layout: One level Bathroom Shower/Tub: Health visitor: Handicapped height Home Adaptive Equipment: Built-in shower seat;Straight cane;Walker - rolling;Wheelchair - powered Additional Comments: scooter Prior Function Level of Independence: Independent with assistive device(s) Able to Take Stairs?: Yes Driving: No Vocation: Retired Musician: No difficulties    Cognition  Overall Cognitive Status: Appears within functional limits for tasks assessed/performed Arousal/Alertness: Awake/alert Orientation Level: Appears intact for tasks assessed Behavior During Session: [facility] for tasks performed    Extremity/Trunk Assessment Right Lower Extremity Assessment RLE ROM/Strength/Tone: [facility] for tasks assessed  Left Lower Extremity Assessment LLE ROM/Strength/Tone: WFL for tasks assessed   Balance    End of Session PT - End of  Session Equipment Utilized During Treatment: Gait belt Activity Tolerance: Patient tolerated treatment well Patient left: in chair;with call bell/phone within reach;with nursing in room Nurse Communication: Mobility status  GP Functional Assessment Tool Used: Clinical judgement Functional Limitation: Mobility: Walking and moving around Mobility: Walking and Moving Around Current Status ([id number]): At least 1 percent but less than 20 percent impaired, limited or restricted Mobility: Walking and Moving Around Goal Status [phone]): At least 1 percent but less than 20 percent impaired, limited or restricted Mobility: Walking and Moving Around Discharge Status [phone]): At least 1 percent but less than 20 percent impaired, limited or restricted   Vista Deck 02/26/2012, 11:33 AM

## 2012-02-26 NOTE — Care Management Note (Signed)
  Page 2 of 2   02/26/2012     12:04:25 PM   CARE MANAGEMENT NOTE 02/26/2012  Patient:  Gary [name],Gary [name]   Account Number:  [id number]  Date Initiated:  02/26/2012  Documentation initiated by:  Colleen Can  Subjective/Objective Assessment:   DX LEFT POSTERIOR RIB FRACTURE AFTER FALL AT HOME; HX CAD, COPD, ETOH ABUSE    RECEIVED ORDERS FOR HH SERVICES     Action/Plan:   CM spoke with patient. plans are for him to return to home in [place]. States he son lives with him and is usually home by 5pm. States daughter may be able to offer support. Already has RW and BSC.   Anticipated DC Date:  02/26/2012   Anticipated DC Plan:  HOME W HOME HEALTH SERVICES      DC Planning Services  CM consult      Choice offered to / List presented to:  C-1 Patient        HH arranged  HH-1 RN  HH-2 PT  HH-3 OT  HH-4 NURSE'S AIDE      HH agency  [facility]   Status of service:  Completed, signed off Medicare Important Message given?   (If response is "NO", the following Medicare IM given date fields will be blank) Date Medicare IM given:   Date Additional Medicare IM given:    Discharge Disposition:    Per UR Regulation:    If discussed at Long Length of Stay Meetings, dates discussed:    Comments:  02/26/2012 Damaris Schooner CCM RN List of personal care agencies(self pay) given to patient to use if needed. TCT [facility] -spoke with Belenda Cruise who accepted case.

## 2012-02-27 ENCOUNTER — Telehealth: Payer: Self-pay

## 2012-02-27 NOTE — Telephone Encounter (Signed)
She is not on his HIPAA, however patient has been falling, and does not know his medications, his daughter is concerned for him and is trying to get a clear medication list for her father. I advised our list is the same as the list from the hospital because we are on same computer system. She states he has 3-4 blood pressure agents and she is unsure which he is to take and which he is not to take. I indicated to her his cardiologist should have the most current list and recomendations, he is Dr Allyson Sabal( provided her the number there) and I have advised her to call there and find out. I am aware she is not on his HIPAA, however if he is falling and she is concerned he is taking too many BP agents, and is still a high fall risk, I made the decision to try to help her help him. No other information was provided. [name] Littrell

## 2012-02-27 NOTE — Telephone Encounter (Signed)
Patients daughter ([name] McCoin) would like a call back regarding her dads medications. He was in the hospital over the weekend and may be missing 1 or 2 that he should be taking.  [phone]

## 2012-02-28 DIAGNOSIS — R0602 Shortness of breath: Secondary | ICD-10-CM | POA: Diagnosis not present

## 2012-02-28 DIAGNOSIS — I2510 Atherosclerotic heart disease of native coronary artery without angina pectoris: Secondary | ICD-10-CM | POA: Diagnosis not present

## 2012-02-28 DIAGNOSIS — I10 Essential (primary) hypertension: Secondary | ICD-10-CM | POA: Diagnosis not present

## 2012-03-01 ENCOUNTER — Telehealth: Payer: Self-pay | Admitting: Radiology

## 2012-03-01 NOTE — Telephone Encounter (Signed)
Received call from [facility] on patient, he is refusing OT at this time, I have advised we did not order this, was upon d/c from hospital she will try to contact referring doctor.

## 2012-03-07 ENCOUNTER — Telehealth: Payer: Self-pay | Admitting: Radiology

## 2012-03-07 NOTE — Telephone Encounter (Signed)
Please call PT and thank them for the call. If he continues to complain of discomfort in the ankle he will need to be seen for x-rays .

## 2012-03-07 NOTE — Telephone Encounter (Signed)
Patient fell this weekend, home PT wants to report the fall, he had ankle pain afterwards but is better now. Revonda Standard PT phone [phone].

## 2012-03-08 ENCOUNTER — Telehealth: Payer: Self-pay

## 2012-03-08 ENCOUNTER — Ambulatory Visit: Payer: Medicare Other | Admitting: Family Medicine

## 2012-03-08 NOTE — Telephone Encounter (Signed)
LMOM w/Dr Daub's message.

## 2012-03-08 NOTE — Telephone Encounter (Signed)
Dr Cleta Alberts had received orders to sign for Home Care for pt and it has been well over a year since he has seen pt. Pt was seen x 2 in 2012 by other providers in our practice for acute issues and had an appt sch for today w/Dr Clelia Croft but cancelled it. I called [facility] and advised that we did not order Home care for pt and can not sign any orders w/out first seeing the pt to assess his needs. [facility] agreed to discuss w/pt.

## 2012-03-09 ENCOUNTER — Ambulatory Visit (HOSPITAL_COMMUNITY)
Admission: RE | Admit: 2012-03-09 | Discharge: 2012-03-09 | Disposition: A | Discharge status: Home / Self Care | Payer: Medicare Other | Source: Ambulatory Visit | Attending: Emergency Medicine | Admitting: Emergency Medicine

## 2012-03-09 ENCOUNTER — Ambulatory Visit: Payer: Medicare Other

## 2012-03-09 ENCOUNTER — Ambulatory Visit (INDEPENDENT_AMBULATORY_CARE_PROVIDER_SITE_OTHER): Payer: Medicare Other | Admitting: Emergency Medicine

## 2012-03-09 VITALS — BP 213/61 | HR 68 | Temp 98.0°F | Resp 17 | Ht 68.5 in | Wt 154.0 lb

## 2012-03-09 DIAGNOSIS — R42 Dizziness and giddiness: Secondary | ICD-10-CM | POA: Diagnosis not present

## 2012-03-09 DIAGNOSIS — M25572 Pain in left ankle and joints of left foot: Secondary | ICD-10-CM

## 2012-03-09 DIAGNOSIS — M25579 Pain in unspecified ankle and joints of unspecified foot: Secondary | ICD-10-CM | POA: Diagnosis not present

## 2012-03-09 DIAGNOSIS — Z9181 History of falling: Secondary | ICD-10-CM | POA: Insufficient documentation

## 2012-03-09 DIAGNOSIS — E871 Hypo-osmolality and hyponatremia: Secondary | ICD-10-CM

## 2012-03-09 DIAGNOSIS — S0990XA Unspecified injury of head, initial encounter: Secondary | ICD-10-CM | POA: Diagnosis not present

## 2012-03-09 LAB — BASIC METABOLIC PANEL
BUN: 11 mg/dL (ref 6–23)
CO2: 22 mEq/L (ref 19–32)
Calcium: 9.9 mg/dL (ref 8.4–10.5)
Glucose, Bld: 91 mg/dL (ref 70–99)
Sodium: 135 mEq/L (ref 135–145)

## 2012-03-09 NOTE — Progress Notes (Signed)
  Subjective:    Patient ID: Gary [name], male    DOB: December 30, 1935, 77 y.o.   MRN: [id number]  HPI  77 year old male here with left ankle injury on Tuesday night. States that he is not hurting but is swelling. Has been really dizzy and fell several times and now has a home health nurse.  Also has a sore throat and would like to have his ears checked to make sure he does not have an ear infection.  Comes to urgent but does not have a primary doctor.  His daughter states that they are getting more involved now that he is falling.  They believe he is not taking his medicine the way it is prescribed so his bp is out of whack.  Quit smoking.  Patient is having physical therapy come out to the home. He continues to have falling episodes. He does walk with the assistance of a walker. He's had heavy alcohol intake in the past and has known peripheral arterial disease and decreased sensation in both feet.    Review of Systems     Objective:   Physical Exam patient is alert and cooperative. His neck is supple. His chest exam was clear to auscultation and percussion. Extremity exam reveals 2+ swelling over the left ankle the ankle itself is not particularly tender. I could not feel pulses in his feet the tips of the toes were referral with approximately 4 second filling time. The dorsum of the foot was warm.  Neurologically he is oriented. He is cooperative. Cranial nerves are intact. Strength in the upper and lower extremities is symmetrical [facility] reading (PRIMARY) by  Dr.Ryelan Kazee is no fracture seen. There is diffuse vascular calcification       Assessment & Plan:  Labs were done to followup on hyponatremia . We'll do an emergent CT of the head because of his hyponatremia and frequent falls as well as abnormal gait. He is going to schedule an appointment at 104. He has a followup cardiology appointment with Dr. Alanda Amass in about 3 weeks. He's placed in the left ankle support. There will discuss peripheral  arterial disease with Dr. Alanda Amass at his appointment.

## 2012-03-22 ENCOUNTER — Telehealth: Payer: Self-pay

## 2012-03-22 NOTE — Telephone Encounter (Signed)
Patient refused service.   Gary [name]  [phone]

## 2012-03-25 NOTE — Telephone Encounter (Signed)
Noted  

## 2012-03-25 NOTE — Telephone Encounter (Signed)
Patient has refused home health services. This is the second time he has refused services. FYI

## 2012-03-27 ENCOUNTER — Other Ambulatory Visit (HOSPITAL_COMMUNITY): Payer: Self-pay | Admitting: Physician Assistant

## 2012-03-27 DIAGNOSIS — E782 Mixed hyperlipidemia: Secondary | ICD-10-CM | POA: Diagnosis not present

## 2012-03-27 DIAGNOSIS — J810 Acute pulmonary edema: Secondary | ICD-10-CM

## 2012-03-27 DIAGNOSIS — I10 Essential (primary) hypertension: Secondary | ICD-10-CM | POA: Diagnosis not present

## 2012-03-27 DIAGNOSIS — I7389 Other specified peripheral vascular diseases: Secondary | ICD-10-CM | POA: Diagnosis not present

## 2012-03-27 DIAGNOSIS — R609 Edema, unspecified: Secondary | ICD-10-CM | POA: Diagnosis not present

## 2012-03-28 DIAGNOSIS — H70009 Acute mastoiditis without complications, unspecified ear: Secondary | ICD-10-CM | POA: Diagnosis not present

## 2012-03-29 ENCOUNTER — Encounter: Payer: Medicare Other | Admitting: Family Medicine

## 2012-04-02 ENCOUNTER — Ambulatory Visit (HOSPITAL_COMMUNITY)
Admission: RE | Admit: 2012-04-02 | Discharge: 2012-04-02 | Disposition: A | Discharge status: Home / Self Care | Payer: Medicare Other | Source: Ambulatory Visit | Attending: Cardiovascular Disease | Admitting: Cardiovascular Disease

## 2012-04-02 DIAGNOSIS — E785 Hyperlipidemia, unspecified: Secondary | ICD-10-CM | POA: Insufficient documentation

## 2012-04-02 DIAGNOSIS — M7989 Other specified soft tissue disorders: Secondary | ICD-10-CM | POA: Diagnosis not present

## 2012-04-02 DIAGNOSIS — J810 Acute pulmonary edema: Secondary | ICD-10-CM

## 2012-04-02 DIAGNOSIS — I10 Essential (primary) hypertension: Secondary | ICD-10-CM | POA: Diagnosis not present

## 2012-04-02 NOTE — Progress Notes (Signed)
Venous Duplex Completed.

## 2012-04-04 DIAGNOSIS — J029 Acute pharyngitis, unspecified: Secondary | ICD-10-CM | POA: Diagnosis not present

## 2012-04-08 ENCOUNTER — Telehealth: Payer: Self-pay

## 2012-04-08 NOTE — Telephone Encounter (Signed)
Scott from [facility] called and asked if home health nursing could be continued for 1 more visit w/pt this week plus two more visits/week for two more weeks. Dr Cleta Alberts had signed original orders and pt had initially refused home health, but then changed his mind and Adv home care nurse has been seeing pt. She feels he is not quite ready for discharge. Ok'd extension of RN visits and they will send a written order for Dr Cleta Alberts to sign. Dr Cleta Alberts, I'm forwarding this FYI.

## 2012-04-11 DIAGNOSIS — J322 Chronic ethmoidal sinusitis: Secondary | ICD-10-CM | POA: Diagnosis not present

## 2012-04-26 ENCOUNTER — Other Ambulatory Visit (HOSPITAL_COMMUNITY): Payer: Self-pay | Admitting: Cardiovascular Disease

## 2012-04-26 DIAGNOSIS — I151 Hypertension secondary to other renal disorders: Secondary | ICD-10-CM

## 2012-04-26 DIAGNOSIS — I10 Essential (primary) hypertension: Secondary | ICD-10-CM | POA: Diagnosis not present

## 2012-04-26 DIAGNOSIS — I517 Cardiomegaly: Secondary | ICD-10-CM | POA: Diagnosis not present

## 2012-04-26 DIAGNOSIS — I7389 Other specified peripheral vascular diseases: Secondary | ICD-10-CM | POA: Diagnosis not present

## 2012-04-26 DIAGNOSIS — I159 Secondary hypertension, unspecified: Secondary | ICD-10-CM | POA: Diagnosis not present

## 2012-05-01 ENCOUNTER — Ambulatory Visit (INDEPENDENT_AMBULATORY_CARE_PROVIDER_SITE_OTHER): Payer: Medicare Other | Admitting: Internal Medicine

## 2012-05-01 ENCOUNTER — Encounter: Payer: Self-pay | Admitting: Internal Medicine

## 2012-05-01 ENCOUNTER — Other Ambulatory Visit (INDEPENDENT_AMBULATORY_CARE_PROVIDER_SITE_OTHER): Payer: Medicare Other

## 2012-05-01 VITALS — BP 182/60 | HR 70 | Temp 97.7°F | Ht 68.0 in | Wt 152.5 lb

## 2012-05-01 DIAGNOSIS — E871 Hypo-osmolality and hyponatremia: Secondary | ICD-10-CM

## 2012-05-01 DIAGNOSIS — I10 Essential (primary) hypertension: Secondary | ICD-10-CM | POA: Diagnosis not present

## 2012-05-01 DIAGNOSIS — F1010 Alcohol abuse, uncomplicated: Secondary | ICD-10-CM

## 2012-05-01 DIAGNOSIS — E785 Hyperlipidemia, unspecified: Secondary | ICD-10-CM

## 2012-05-01 LAB — CBC WITH DIFFERENTIAL/PLATELET
Basophils Absolute: 0 10*3/uL (ref 0.0–0.1)
Basophils Relative: 0.2 % (ref 0.0–3.0)
HCT: 44.7 % (ref 39.0–52.0)
Hemoglobin: 15.1 g/dL (ref 13.0–17.0)
Lymphs Abs: 1.2 10*3/uL (ref 0.7–4.0)
MCHC: 33.9 g/dL (ref 30.0–36.0)
Monocytes Relative: 11.5 % (ref 3.0–12.0)
Neutro Abs: 6.2 10*3/uL (ref 1.4–7.7)
RBC: 4.97 Mil/uL (ref 4.22–5.81)
RDW: 14.1 % (ref 11.5–14.6)

## 2012-05-01 LAB — BASIC METABOLIC PANEL
BUN: 11 mg/dL (ref 6–23)
Creatinine, Ser: 0.7 mg/dL (ref 0.4–1.5)
GFR: 115.08 mL/min (ref >60.00)
Glucose, Bld: 104 mg/dL — ABNORMAL HIGH (ref 70–99)

## 2012-05-01 LAB — HEPATIC FUNCTION PANEL
AST: 25 U/L (ref 0–37)
Albumin: 3.6 g/dL (ref 3.5–5.2)
Alkaline Phosphatase: 41 U/L (ref 39–117)
Total Protein: 7 g/dL (ref 6.0–8.3)

## 2012-05-01 LAB — LIPID PANEL
Cholesterol: 203 mg/dL — ABNORMAL HIGH (ref 0–200)
Total CHOL/HDL Ratio: 3

## 2012-05-01 LAB — LDL CHOLESTEROL, DIRECT: Direct LDL: 114.3 mg/dL

## 2012-05-01 NOTE — Assessment & Plan Note (Signed)
Not well controlled Continue current therapy Will defer treatment to cardiology

## 2012-05-01 NOTE — Assessment & Plan Note (Signed)
Will check lipids today Will titrated therapy based on levels

## 2012-05-01 NOTE — Patient Instructions (Signed)
Health Maintenance, Males A healthy lifestyle and preventative care can promote health and wellness.  Maintain regular health, dental, and eye exams.  Eat a healthy diet. Foods like vegetables, fruits, whole grains, low-fat dairy products, and lean protein foods contain the nutrients you need without too many calories. Decrease your intake of foods high in solid fats, added sugars, and salt. Get information about a proper diet from your caregiver, if necessary.  Regular physical exercise is one of the most important things you can do for your health. Most adults should get at least 150 minutes of moderate-intensity exercise (any activity that increases your heart rate and causes you to sweat) each week. In addition, most adults need muscle-strengthening exercises on 2 or more days a week.   Maintain a healthy weight. The body mass index (BMI) is a screening tool to identify possible weight problems. It provides an estimate of body fat based on height and weight. Your caregiver can help determine your BMI, and can help you achieve or maintain a healthy weight. For adults 20 years and older:  A BMI below 18.5 is considered underweight.  A BMI of 18.5 to 24.9 is normal.  A BMI of 25 to 29.9 is considered overweight.  A BMI of 30 and above is considered obese.  Maintain normal blood lipids and cholesterol by exercising and minimizing your intake of saturated fat. Eat a balanced diet with plenty of fruits and vegetables. Blood tests for lipids and cholesterol should begin at age 20 and be repeated every 5 years. If your lipid or cholesterol levels are high, you are over 50, or you are a high risk for heart disease, you may need your cholesterol levels checked more frequently.Ongoing high lipid and cholesterol levels should be treated with medicines, if diet and exercise are not effective.  If you smoke, find out from your caregiver how to quit. If you do not use tobacco, do not start.  If you  choose to drink alcohol, do not exceed 2 drinks per day. One drink is considered to be 12 ounces (355 mL) of beer, 5 ounces (148 mL) of wine, or 1.5 ounces (44 mL) of liquor.  Avoid use of street drugs. Do not share needles with anyone. Ask for help if you need support or instructions about stopping the use of drugs.  High blood pressure causes heart disease and increases the risk of stroke. Blood pressure should be checked at least every 1 to 2 years. Ongoing high blood pressure should be treated with medicines if weight loss and exercise are not effective.  If you are 45 to 75 years old, ask your caregiver if you should take aspirin to prevent heart disease.  Diabetes screening involves taking a blood sample to check your fasting blood sugar level. This should be done once every 3 years, after age 45, if you are within normal weight and without risk factors for diabetes. Testing should be considered at a younger age or be carried out more frequently if you are overweight and have at least 1 risk factor for diabetes.  Colorectal cancer can be detected and often prevented. Most routine colorectal cancer screening begins at the age of 50 and continues through age 75. However, your caregiver may recommend screening at an earlier age if you have risk factors for colon cancer. On a yearly basis, your caregiver may provide home test kits to check for hidden blood in the stool. Use of a small camera at the end of a tube,   to directly examine the colon (sigmoidoscopy or colonoscopy), can detect the earliest forms of colorectal cancer. Talk to your caregiver about this at age 50, when routine screening begins. Direct examination of the colon should be repeated every 5 to 10 years through age 75, unless early forms of pre-cancerous polyps or small growths are found.  Hepatitis C blood testing is recommended for all people born from 1945 through 1965 and any individual with known risks for hepatitis C.  Healthy  men should no longer receive prostate-specific antigen (PSA) blood tests as part of routine cancer screening. Consult with your caregiver about prostate cancer screening.  Testicular cancer screening is not recommended for adolescents or adult males who have no symptoms. Screening includes self-exam, caregiver exam, and other screening tests. Consult with your caregiver about any symptoms you have or any concerns you have about testicular cancer.  Practice safe sex. Use condoms and avoid high-risk sexual practices to reduce the spread of sexually transmitted infections (STIs).  Use sunscreen with a sun protection factor (SPF) of 30 or greater. Apply sunscreen liberally and repeatedly throughout the day. You should seek shade when your shadow is shorter than you. Protect yourself by wearing long sleeves, pants, a wide-brimmed hat, and sunglasses year round, whenever you are outdoors.  Notify your caregiver of new moles or changes in moles, especially if there is a change in shape or color. Also notify your caregiver if a mole is larger than the size of a pencil eraser.  A one-time screening for abdominal aortic aneurysm (AAA) and surgical repair of large AAAs by sound wave imaging (ultrasonography) is recommended for ages 65 to 75 years who are current or former smokers.  Stay current with your immunizations. Document Released: 07/22/2007 Document Revised: 04/17/2011 Document Reviewed: 06/20/2010 [employer] Patient Information 2013 [employer].  

## 2012-05-01 NOTE — Progress Notes (Addendum)
HPI  Pt presents to the clinic today to establish care. He has not had a PCP, only goes to urgent care as needed. He does see a cardiologist who manages his blood pressure. His blood pressure is elevated today and usually is when he goes to the doctor. He does monitor his blood pressure at home and it typically runs in the 130-140/80's.  Flu: 11/2011 Pneumovax: 2010 Zostavax: never Tetanus: 2005 Colonoscopy: 2008 Eye doctor: no Dentist: dentures  Past Medical History  Diagnosis Date  . COPD (chronic obstructive pulmonary disease)   . Hypertension   . PVD (peripheral vascular disease) 04/27/2011    Normal Renal & abdominal Aortography; s/p Left Fem-Pop BPG 2004.  Marland Kitchen Dyslipidemia   . Coronary artery disease 04/27/2011    Low risk Myoview in 2010:Non-obstructive disase, except for small OMB 95% ostial lesion    Current Outpatient Prescriptions  Medication Sig Dispense Refill  . albuterol (PROVENTIL HFA;VENTOLIN HFA) 108 (90 BASE) MCG/ACT inhaler Inhale 2 puffs into the lungs every 4 (four) hours as needed for wheezing.  1 Inhaler  12  . amLODipine (NORVASC) 2.5 MG tablet Take 2 tablets (5 mg total) by mouth daily.  30 tablet  0  . aspirin 81 MG tablet Take 81 mg by mouth daily.      . bisacodyl (BISACODYL) 5 MG EC tablet Take 1 tablet (5 mg total) by mouth daily as needed for constipation.  30 tablet  0  . Choline Fenofibrate (TRILIPIX) 135 MG capsule Take 135 mg by mouth daily.      Marland Kitchen co-enzyme Q-10 30 MG capsule Take 30 mg by mouth daily.      Marland Kitchen docusate sodium 100 MG CAPS Take 100 mg by mouth 2 (two) times daily.  60 capsule  0  . enalapril (VASOTEC) 20 MG tablet Take 20 mg by mouth daily.      . fexofenadine (ALLEGRA) 180 MG tablet Take 1/2 by mouth daily      . folic acid (FOLVITE) 1 MG tablet Take 1 tablet (1 mg total) by mouth daily.  30 tablet  0  . hydrALAZINE (APRESOLINE) 50 MG tablet Take 1 tablet (50 mg total) by mouth 3 (three) times daily.  90 tablet  0  . metoprolol  succinate (TOPROL-XL) 50 MG 24 hr tablet Take 25 mg by mouth daily. Take with or immediately following a meal.      . omeprazole (PRILOSEC) 20 MG capsule Take 20 mg by mouth daily.      Marland Kitchen oxyCODONE (OXY IR/ROXICODONE) 5 MG immediate release tablet Take 1-2 tablets (5-10 mg total) by mouth every 4 (four) hours as needed.  30 tablet  0  . rosuvastatin (CRESTOR) 20 MG tablet Take 20 mg by mouth daily.      . vitamin B-12 (CYANOCOBALAMIN) 100 MCG tablet Take 100 mcg by mouth daily.       No current facility-administered medications for this visit.    Allergies  Allergen Reactions  . Contrast Media (Iodinated Diagnostic Agents) Itching  . Neomycin   . Neosporin (Neomycin-Bacitracin Zn-Polymyx)     Family History  Problem Relation Age of Onset  . CAD Brother   . Hypertension Father   . Hypertension Brother      X 2  . Cancer - Other Mother     Kidney    History   Social History  . Marital Status: Divorced    Spouse Name: N/A    Number of Children: 3  . Years of  Education: N/A   Occupational History  . welder, pipe fitter    Social History Main Topics  . Smoking status: Former Smoker -- 1.00 packs/day for 50 years    Types: Cigarettes  . Smokeless tobacco: Former Neurosurgeon    Quit date: 04/27/1998  . Alcohol Use: 1.2 oz/week    2 Cans of beer per week  . Drug Use: No  . Sexually Active: No   Other Topics Concern  . Not on file   Social History Narrative  . No narrative on file    ROS:  Constitutional: Denies fever, malaise, fatigue, headache or abrupt weight changes.  HEENT: Denies eye pain, eye redness, ear pain, ringing in the ears, wax buildup, runny nose, nasal congestion, bloody nose, or sore throat. Respiratory: Denies difficulty breathing, shortness of breath, cough or sputum production.   Cardiovascular: Denies chest pain, chest tightness, palpitations or swelling in the hands or feet.  Gastrointestinal: Pt reports intermittent constipation. Denies abdominal  pain, bloating, diarrhea or blood in the stool.  GU: Denies frequency, urgency, pain with urination, blood in urine, odor or discharge. Musculoskeletal: Denies decrease in range of motion, difficulty with gait, muscle pain or joint pain and swelling.  Skin: Denies redness, rashes, lesions or ulcercations.  Neurological: Pt does report some decreased memory. Denies dizziness, difficulty with speech or problems with balance and coordination.   No other specific complaints in a complete review of systems (except as listed in HPI above).  PE:  BP 182/60  Pulse 70  Temp(Src) 97.7 F (36.5 C) (Oral)  Ht 5\' 8"  (1.727 m)  Wt 152 lb 8 oz (69.174 kg)  BMI 23.19 kg/m2  SpO2 97% Wt Readings from Last 3 Encounters:  05/01/12 152 lb 8 oz (69.174 kg)  03/09/12 154 lb (69.854 kg)  02/25/12 152 lb 5.4 oz (69.1 kg)    General: Appears his stated age, well developed, well nourished in NAD. HEENT: Head: normal shape and size; Eyes: sclera white, no icterus, conjunctiva pink, PERRLA and EOMs intact; Ears: Tm's gray and intact, normal light reflex; Nose: mucosa pink and moist, septum midline; Throat/Mouth: Teeth present, mucosa pink and moist, no lesions or ulcerations noted.  Neck: Normal range of motion. Neck supple, trachea midline. No massses, lumps or thyromegaly present.  Cardiovascular: Normal rate and rhythm. S1,S2 noted.  No murmur, rubs or gallops noted. No JVD or BLE edema. No carotid bruits noted. Pulmonary/Chest: Normal effort and positive vesicular breath sounds. No respiratory distress. No wheezes, rales or ronchi noted.  Abdomen: Soft and nontender. Normal bowel sounds, no bruits noted. No distention or masses noted. Liver, spleen and kidneys non palpable. Musculoskeletal: Normal range of motion. No signs of joint swelling. No difficulty with gait.  Neurological: Alert and oriented. Cranial nerves II-XII intact. Coordination normal. +DTRs bilaterally. Psychiatric: Mood and affect normal.  Behavior is normal. Judgment and thought content normal.    Assessment and Plan:  Pt PMH, FH, SH medication list and recent hospitlizations reviewed. Approx time to review all notes 20 minutes.  Preventative Health:  Pt declines Zostavax at this time Encouraged pt to visit an eye doctor this year Encouraged pt to cut back on his etoh use

## 2012-05-01 NOTE — Assessment & Plan Note (Signed)
--  Will recheck BMET today.  

## 2012-05-01 NOTE — Assessment & Plan Note (Signed)
encouraged pt to cut back on etoh use- pt declines at this time Will check LFT's today

## 2012-05-07 HISTORY — PX: OTHER SURGICAL HISTORY: SHX169

## 2012-05-16 ENCOUNTER — Ambulatory Visit (HOSPITAL_COMMUNITY)
Admission: RE | Admit: 2012-05-16 | Discharge: 2012-05-16 | Disposition: A | Discharge status: Home / Self Care | Payer: Medicare Other | Source: Ambulatory Visit | Attending: Cardiovascular Disease | Admitting: Cardiovascular Disease

## 2012-05-16 DIAGNOSIS — I158 Other secondary hypertension: Secondary | ICD-10-CM | POA: Diagnosis not present

## 2012-05-16 DIAGNOSIS — I151 Hypertension secondary to other renal disorders: Secondary | ICD-10-CM

## 2012-05-16 DIAGNOSIS — I701 Atherosclerosis of renal artery: Secondary | ICD-10-CM | POA: Diagnosis not present

## 2012-05-16 DIAGNOSIS — I10 Essential (primary) hypertension: Secondary | ICD-10-CM | POA: Diagnosis not present

## 2012-05-16 NOTE — Progress Notes (Signed)
Renal Duplex Completed. Gary [name]  

## 2012-05-24 ENCOUNTER — Encounter: Payer: Self-pay | Admitting: Internal Medicine

## 2012-05-24 ENCOUNTER — Ambulatory Visit (INDEPENDENT_AMBULATORY_CARE_PROVIDER_SITE_OTHER): Payer: Medicare Other | Admitting: Internal Medicine

## 2012-05-24 ENCOUNTER — Ambulatory Visit (INDEPENDENT_AMBULATORY_CARE_PROVIDER_SITE_OTHER)
Admission: RE | Admit: 2012-05-24 | Discharge: 2012-05-24 | Disposition: A | Discharge status: Home / Self Care | Payer: Medicare Other | Source: Ambulatory Visit | Attending: Internal Medicine | Admitting: Internal Medicine

## 2012-05-24 VITALS — BP 124/80 | HR 67 | Temp 97.4°F | Ht 68.0 in | Wt 150.0 lb

## 2012-05-24 DIAGNOSIS — M549 Dorsalgia, unspecified: Secondary | ICD-10-CM

## 2012-05-24 DIAGNOSIS — J189 Pneumonia, unspecified organism: Secondary | ICD-10-CM

## 2012-05-24 DIAGNOSIS — F329 Major depressive disorder, single episode, unspecified: Secondary | ICD-10-CM | POA: Diagnosis not present

## 2012-05-24 MED ORDER — FLUOXETINE HCL 10 MG PO TABS: 10.0000 mg | ORAL_TABLET | Freq: Every day | ORAL | Status: DC

## 2012-05-24 MED ORDER — LEVOFLOXACIN 500 MG PO TABS: 500.0000 mg | ORAL_TABLET | Freq: Every day | ORAL | Status: DC

## 2012-05-24 NOTE — Patient Instructions (Signed)

## 2012-05-24 NOTE — Progress Notes (Signed)
Subjective:    Patient ID: Gary [name], male    DOB: 01-09-1936, 77 y.o.   MRN: [id number]  HPI  Pt presents to the clinic today with c/o depression. This started a few months ago. He just feels down that he can not get out and about like he used to. He feels like he is losing control of his life. His son has had to take over the maintenance of his his house and his daughter has had to take over dispensing his medications and his finances. He also feels lonely. He reports that a lot of his longtime friends are passing away; He does have a very supportive family. He does have one daughter and one son that live in the area and are very involved with him. He denies SI/HI.  AditionallyToday, he c/o pain in his back that is right in between his shoulder blades. The pain is constant 7/10. He has not had a specific injury to the area. He does have a cough and sputum production but he does have COPD. He does not feel short of breath. He denies fever, chills or body aches.   Review of Systems      Past Medical History  Diagnosis Date  . COPD (chronic obstructive pulmonary disease)   . Hypertension   . PVD (peripheral vascular disease) 04/27/2011    Normal Renal & abdominal Aortography; s/p Left Fem-Pop BPG 2004.  Marland Kitchen Dyslipidemia   . Coronary artery disease 04/27/2011    Low risk Myoview in 2010:Non-obstructive disase, except for small OMB 95% ostial lesion    Current Outpatient Prescriptions  Medication Sig Dispense Refill  . albuterol (PROVENTIL HFA;VENTOLIN HFA) 108 (90 BASE) MCG/ACT inhaler Inhale 2 puffs into the lungs every 4 (four) hours as needed for wheezing.  1 Inhaler  12  . amLODipine (NORVASC) 2.5 MG tablet Take 2 tablets (5 mg total) by mouth daily.  30 tablet  0  . aspirin 81 MG tablet Take 81 mg by mouth daily.      . bisacodyl (BISACODYL) 5 MG EC tablet Take 1 tablet (5 mg total) by mouth daily as needed for constipation.  30 tablet  0  . Choline Fenofibrate (TRILIPIX) 135 MG  capsule Take 135 mg by mouth daily.      Marland Kitchen co-enzyme Q-10 30 MG capsule Take 30 mg by mouth daily.      Marland Kitchen docusate sodium 100 MG CAPS Take 100 mg by mouth 2 (two) times daily.  60 capsule  0  . enalapril (VASOTEC) 20 MG tablet Take 20 mg by mouth daily.      . fexofenadine (ALLEGRA) 180 MG tablet Take 1/2 by mouth daily      . folic acid (FOLVITE) 1 MG tablet Take 1 tablet (1 mg total) by mouth daily.  30 tablet  0  . hydrALAZINE (APRESOLINE) 50 MG tablet Take 1 tablet (50 mg total) by mouth 3 (three) times daily.  90 tablet  0  . metoprolol succinate (TOPROL-XL) 50 MG 24 hr tablet Take 25 mg by mouth daily. Take with or immediately following a meal.      . omeprazole (PRILOSEC) 20 MG capsule Take 20 mg by mouth daily.      Marland Kitchen oxyCODONE (OXY IR/ROXICODONE) 5 MG immediate release tablet Take 1-2 tablets (5-10 mg total) by mouth every 4 (four) hours as needed.  30 tablet  0  . rosuvastatin (CRESTOR) 20 MG tablet Take 20 mg by mouth daily.      Marland Kitchen  vitamin B-12 (CYANOCOBALAMIN) 100 MCG tablet Take 100 mcg by mouth daily.       No current facility-administered medications for this visit.    Allergies  Allergen Reactions  . Contrast Media (Iodinated Diagnostic Agents) Itching  . Neomycin   . Neosporin (Neomycin-Bacitracin Zn-Polymyx)     Family History  Problem Relation Age of Onset  . CAD Brother   . Hypertension Father   . Hypertension Brother      X 2  . Cancer - Other Mother     Kidney    History   Social History  . Marital Status: Divorced    Spouse Name: N/A    Number of Children: 3  . Years of Education: N/A   Occupational History  . welder, pipe fitter    Social History Main Topics  . Smoking status: Former Smoker -- 1.00 packs/day for 50 years    Types: Cigarettes  . Smokeless tobacco: Former Neurosurgeon    Quit date: 04/27/1998  . Alcohol Use: 1.2 oz/week    2 Cans of beer per week  . Drug Use: No  . Sexually Active: No   Other Topics Concern  . Not on file    Social History Narrative  . No narrative on file     Constitutional: Pt reports fatigue. Denies fever, malaise,  headache or abrupt weight changes.  Respiratory: Pt reports cough and sputum production. Denies difficulty breathing, shortness of breath.   Cardiovascular: Denies chest pain, chest tightness, palpitations or swelling in the hands or feet.  Musculoskeletal: Pt reports back pain. Denies decrease in range of motion, difficulty with gait, muscle pain or joint pain and swelling.   Neurological: Denies dizziness, difficulty with memory, difficulty with speech or problems with balance and coordination.  Psych: Pt reports mild depression, denies anxiety, difficulty sleeping, SI/HI.  No other specific complaints in a complete review of systems (except as listed in HPI above).  Objective:   Physical Exam   BP 124/80  Pulse 67  Temp(Src) 97.4 F (36.3 C) (Oral)  Ht 5\' 8"  (1.727 m)  Wt 150 lb (68.04 kg)  BMI 22.81 kg/m2  SpO2 97% Wt Readings from Last 3 Encounters:  05/24/12 150 lb (68.04 kg)  05/01/12 152 lb 8 oz (69.174 kg)  03/09/12 154 lb (69.854 kg)    General: Appears his stated age, well developed, well nourished in NAD. Cardiovascular: Normal rate and rhythm. S1,S2 noted.  No murmur, rubs or gallops noted. No JVD or BLE edema. No carotid bruits noted. Pulmonary/Chest: Normal effort and coarse ronchi throughout. No respiratory distress. No wheezes, rales noted.  Musculoskeletal: Normal range of motion. No signs of joint swelling. No difficulty with gait.  Neurological: Alert and oriented. Cranial nerves II-XII intact. Coordination normal. +DTRs bilaterally. Psychiatric: Mood slightly depressed and affect normal. Behavior is normal. Judgment and thought content normal.     BMET    Component Value Date/Time   NA 136 05/01/2012 1018   K 3.7 05/01/2012 1018   CL 100 05/01/2012 1018   CO2 26 05/01/2012 1018   GLUCOSE 104* 05/01/2012 1018   BUN 11 05/01/2012 1018    CREATININE 0.7 05/01/2012 1018   CREATININE 0.62 03/09/2012 1119   CALCIUM 9.4 05/01/2012 1018   GFRNONAA >90 02/26/2012 0740   GFRAA >90 02/26/2012 0740    Lipid Panel     Component Value Date/Time   CHOL 203* 05/01/2012 1018   TRIG 118.0 05/01/2012 1018   HDL 60.80 05/01/2012 1018  CHOLHDL 3 05/01/2012 1018   VLDL 23.6 05/01/2012 1018    CBC    Component Value Date/Time   WBC 8.5 05/01/2012 1018   RBC 4.97 05/01/2012 1018   HGB 15.1 05/01/2012 1018   HCT 44.7 05/01/2012 1018   PLT 281.0 05/01/2012 1018   MCV 89.9 05/01/2012 1018   MCH 31.7 02/25/2012 0510   MCHC 33.9 05/01/2012 1018   RDW 14.1 05/01/2012 1018   LYMPHSABS 1.2 05/01/2012 1018   MONOABS 1.0 05/01/2012 1018   EOSABS 0.1 05/01/2012 1018   BASOSABS 0.0 05/01/2012 1018    Hgb A1C No results found for this basename: HGBA1C        Assessment & Plan:   Acute Back pain, new onset:  Give cough, will obtain chest xray of thoracic spine to r/o pneumonia versus compression fracture. Chest xray showed pneumonia- eRx for Levaquin given RTC in 1 week for repeat chest xray to show clearing. If worse, come back for follow up  Mild depression, new onset:  Will start Prozac 10 mg Reassurance given Pt declines to go see a counseler at this time.   RTC in 1 month to evaluate effectiveness of treatment

## 2012-05-26 ENCOUNTER — Encounter: Payer: Self-pay | Admitting: Internal Medicine

## 2012-06-05 ENCOUNTER — Ambulatory Visit (INDEPENDENT_AMBULATORY_CARE_PROVIDER_SITE_OTHER): Payer: Medicare Other | Admitting: Internal Medicine

## 2012-06-05 ENCOUNTER — Ambulatory Visit (INDEPENDENT_AMBULATORY_CARE_PROVIDER_SITE_OTHER)
Admission: RE | Admit: 2012-06-05 | Discharge: 2012-06-05 | Disposition: A | Discharge status: Home / Self Care | Payer: Medicare Other | Source: Ambulatory Visit | Attending: Internal Medicine | Admitting: Internal Medicine

## 2012-06-05 ENCOUNTER — Encounter: Payer: Self-pay | Admitting: Internal Medicine

## 2012-06-05 VITALS — BP 156/70 | HR 63 | Temp 98.0°F | Ht 68.0 in | Wt 150.0 lb

## 2012-06-05 DIAGNOSIS — J90 Pleural effusion, not elsewhere classified: Secondary | ICD-10-CM | POA: Diagnosis not present

## 2012-06-05 DIAGNOSIS — R059 Cough, unspecified: Secondary | ICD-10-CM

## 2012-06-05 DIAGNOSIS — J189 Pneumonia, unspecified organism: Secondary | ICD-10-CM

## 2012-06-05 DIAGNOSIS — M549 Dorsalgia, unspecified: Secondary | ICD-10-CM

## 2012-06-05 DIAGNOSIS — R05 Cough: Secondary | ICD-10-CM

## 2012-06-05 NOTE — Patient Instructions (Signed)

## 2012-06-05 NOTE — Progress Notes (Signed)
Subjective:    Patient ID: Gary [name], male    DOB: [birth year], 77 y.o.   MRN: [id number]  HPI  Pt presents to the clinic today to f/u his last xray. 1 week ago he came in with back pain, fatigue and cough. I orginally thought he may have had a compression fracture of his thoracic spine. Xray revealed pneumonia and no compression fracture. He was treated with Levaquin. He reports that the cough is gone. He still has some intermittent back pain. He is putting icy hot patches on it. That helpsa to relieve the pain.   Review of Systems  Past Medical History  Diagnosis Date  . COPD (chronic obstructive pulmonary disease)   . Hypertension   . PVD (peripheral vascular disease) 04/27/2011    Normal Renal & abdominal Aortography; s/p Left Fem-Pop BPG 2004.  Marland Kitchen Dyslipidemia   . Coronary artery disease 04/27/2011    Low risk Myoview in 2010:Non-obstructive disase, except for small OMB 95% ostial lesion    Current Outpatient Prescriptions  Medication Sig Dispense Refill  . albuterol (PROVENTIL HFA;VENTOLIN HFA) 108 (90 BASE) MCG/ACT inhaler Inhale 2 puffs into the lungs every 4 (four) hours as needed for wheezing.  1 Inhaler  12  . amLODipine (NORVASC) 10 MG tablet Take 10 mg by mouth daily.      Marland Kitchen aspirin 81 MG tablet Take 81 mg by mouth daily.      . bisacodyl (BISACODYL) 5 MG EC tablet Take 1 tablet (5 mg total) by mouth daily as needed for constipation.  30 tablet  0  . Choline Fenofibrate (TRILIPIX) 135 MG capsule Take 135 mg by mouth daily.      Marland Kitchen co-enzyme Q-10 30 MG capsule Take 30 mg by mouth daily.      Marland Kitchen docusate sodium 100 MG CAPS Take 100 mg by mouth 2 (two) times daily.  60 capsule  0  . enalapril (VASOTEC) 20 MG tablet Take 20 mg by mouth daily.      . fexofenadine (ALLEGRA) 180 MG tablet Take 1/2 by mouth daily      . FLUoxetine (PROZAC) 10 MG tablet Take 1 tablet (10 mg total) by mouth daily.  30 tablet  0  . folic acid (FOLVITE) 1 MG tablet Take 1 tablet (1 mg total) by  mouth daily.  30 tablet  0  . hydrALAZINE (APRESOLINE) 50 MG tablet Take 1 tablet (50 mg total) by mouth 3 (three) times daily.  90 tablet  0  . metoprolol succinate (TOPROL-XL) 25 MG 24 hr tablet Take 25 mg by mouth daily.      Marland Kitchen omeprazole (PRILOSEC) 20 MG capsule Take 20 mg by mouth daily.      Marland Kitchen oxyCODONE (OXY IR/ROXICODONE) 5 MG immediate release tablet Take 1-2 tablets (5-10 mg total) by mouth every 4 (four) hours as needed.  30 tablet  0  . rosuvastatin (CRESTOR) 20 MG tablet Take 20 mg by mouth daily.      . vitamin B-12 (CYANOCOBALAMIN) 100 MCG tablet Take 100 mcg by mouth daily.       No current facility-administered medications for this visit.    Allergies  Allergen Reactions  . Contrast Media (Iodinated Diagnostic Agents) Itching  . Neomycin   . Neosporin (Neomycin-Bacitracin Zn-Polymyx)     Family History  Problem Relation Age of Onset  . CAD Brother   . Hypertension Father   . Hypertension Brother      X 2  . Cancer - Other  Mother     Kidney    History   Social History  . Marital Status: Divorced    Spouse Name: N/A    Number of Children: 3  . Years of Education: N/A   Occupational History  . welder, pipe fitter    Social History Main Topics  . Smoking status: Former Smoker -- 1.00 packs/day for 50 years    Types: Cigarettes  . Smokeless tobacco: Former Neurosurgeon    Quit date: 04/27/1998  . Alcohol Use: 1.2 oz/week    2 Cans of beer per week  . Drug Use: No  . Sexually Active: No   Other Topics Concern  . Not on file   Social History Narrative  . No narrative on file     Constitutional: Denies fever, malaise, fatigue, headache or abrupt weight changes.  Respiratory: Denies difficulty breathing, shortness of breath, cough or sputum production.   Cardiovascular: Denies chest pain, chest tightness, palpitations or swelling in the hands or feet.  Musculoskeletal: Pt reports intermittent back pain. Denies decrease in range of motion, difficulty with  gait, muscle pain or joint pain and swelling.     No other specific complaints in a complete review of systems (except as listed in HPI above).     Objective:   Physical Exam  BP 156/70  Pulse 63  Temp(Src) 98 F (36.7 C) (Oral)  Ht 5\' 8"  (1.727 m)  Wt 150 lb (68.04 kg)  BMI 22.81 kg/m2  SpO2 96% Wt Readings from Last 3 Encounters:  06/05/12 150 lb (68.04 kg)  05/24/12 150 lb (68.04 kg)  05/01/12 152 lb 8 oz (69.174 kg)    General: Appears his stated age, well developed, well nourished in NAD.  Cardiovascular: Normal rate and rhythm. S1,S2 noted.  No murmur, rubs or gallops noted. No JVD or BLE edema. No carotid bruits noted. Pulmonary/Chest: Normal effort and positive vesicular breath sounds. No respiratory distress. No wheezes, rales or ronchi noted.  Musculoskeletal: Normal range of motion. No signs of joint swelling. No difficulty with gait. mild kyphosis.  BMET    Component Value Date/Time   NA 136 05/01/2012 1018   K 3.7 05/01/2012 1018   CL 100 05/01/2012 1018   CO2 26 05/01/2012 1018   GLUCOSE 104* 05/01/2012 1018   BUN 11 05/01/2012 1018   CREATININE 0.7 05/01/2012 1018   CREATININE 0.62 03/09/2012 1119   CALCIUM 9.4 05/01/2012 1018   GFRNONAA >90 02/26/2012 0740   GFRAA >90 02/26/2012 0740    Lipid Panel     Component Value Date/Time   CHOL 203* 05/01/2012 1018   TRIG 118.0 05/01/2012 1018   HDL 60.80 05/01/2012 1018   CHOLHDL 3 05/01/2012 1018   VLDL 23.6 05/01/2012 1018    CBC    Component Value Date/Time   WBC 8.5 05/01/2012 1018   RBC 4.97 05/01/2012 1018   HGB 15.1 05/01/2012 1018   HCT 44.7 05/01/2012 1018   PLT 281.0 05/01/2012 1018   MCV 89.9 05/01/2012 1018   MCH 31.7 02/25/2012 0510   MCHC 33.9 05/01/2012 1018   RDW 14.1 05/01/2012 1018   LYMPHSABS 1.2 05/01/2012 1018   MONOABS 1.0 05/01/2012 1018   EOSABS 0.1 05/01/2012 1018   BASOSABS 0.0 05/01/2012 1018    Hgb A1C No results found for this basename: HGBA1C         Assessment & Plan:    Pneumonia, community acquired:  Will check xray to see if pneumonia has resolved

## 2012-06-06 ENCOUNTER — Other Ambulatory Visit: Payer: Self-pay | Admitting: Internal Medicine

## 2012-06-06 MED ORDER — FUROSEMIDE 20 MG PO TABS: ORAL_TABLET | ORAL | Status: DC

## 2012-06-19 ENCOUNTER — Encounter (HOSPITAL_COMMUNITY): Payer: Self-pay | Admitting: Pharmacy Technician

## 2012-06-24 ENCOUNTER — Other Ambulatory Visit: Payer: Self-pay | Admitting: *Deleted

## 2012-06-24 DIAGNOSIS — I739 Peripheral vascular disease, unspecified: Secondary | ICD-10-CM

## 2012-06-25 ENCOUNTER — Telehealth: Payer: Self-pay | Admitting: Cardiovascular Disease

## 2012-06-25 ENCOUNTER — Encounter (HOSPITAL_COMMUNITY)
Admission: RE | Disposition: A | Discharge status: Home / Self Care | Payer: Self-pay | Source: Ambulatory Visit | Attending: Cardiovascular Disease

## 2012-06-25 ENCOUNTER — Ambulatory Visit (HOSPITAL_COMMUNITY): Payer: Medicare Other

## 2012-06-25 ENCOUNTER — Ambulatory Visit (HOSPITAL_COMMUNITY)
Admission: RE | Admit: 2012-06-25 | Discharge: 2012-06-25 | Disposition: A | Discharge status: Home / Self Care | Payer: Medicare Other | Source: Ambulatory Visit | Attending: Cardiovascular Disease | Admitting: Cardiovascular Disease

## 2012-06-25 DIAGNOSIS — I739 Peripheral vascular disease, unspecified: Secondary | ICD-10-CM

## 2012-06-25 DIAGNOSIS — Z87891 Personal history of nicotine dependence: Secondary | ICD-10-CM | POA: Diagnosis not present

## 2012-06-25 DIAGNOSIS — J449 Chronic obstructive pulmonary disease, unspecified: Secondary | ICD-10-CM | POA: Diagnosis not present

## 2012-06-25 DIAGNOSIS — I2510 Atherosclerotic heart disease of native coronary artery without angina pectoris: Secondary | ICD-10-CM | POA: Diagnosis not present

## 2012-06-25 DIAGNOSIS — Z888 Allergy status to other drugs, medicaments and biological substances status: Secondary | ICD-10-CM | POA: Diagnosis not present

## 2012-06-25 DIAGNOSIS — Z79899 Other long term (current) drug therapy: Secondary | ICD-10-CM | POA: Insufficient documentation

## 2012-06-25 DIAGNOSIS — I700 Atherosclerosis of aorta: Secondary | ICD-10-CM | POA: Diagnosis not present

## 2012-06-25 DIAGNOSIS — I70209 Unspecified atherosclerosis of native arteries of extremities, unspecified extremity: Secondary | ICD-10-CM | POA: Insufficient documentation

## 2012-06-25 DIAGNOSIS — Z7982 Long term (current) use of aspirin: Secondary | ICD-10-CM | POA: Diagnosis not present

## 2012-06-25 DIAGNOSIS — Z8051 Family history of malignant neoplasm of kidney: Secondary | ICD-10-CM | POA: Diagnosis not present

## 2012-06-25 DIAGNOSIS — I701 Atherosclerosis of renal artery: Secondary | ICD-10-CM

## 2012-06-25 DIAGNOSIS — J90 Pleural effusion, not elsewhere classified: Secondary | ICD-10-CM | POA: Diagnosis not present

## 2012-06-25 DIAGNOSIS — Z91041 Radiographic dye allergy status: Secondary | ICD-10-CM | POA: Insufficient documentation

## 2012-06-25 DIAGNOSIS — J4489 Other specified chronic obstructive pulmonary disease: Secondary | ICD-10-CM | POA: Insufficient documentation

## 2012-06-25 DIAGNOSIS — Z8249 Family history of ischemic heart disease and other diseases of the circulatory system: Secondary | ICD-10-CM | POA: Insufficient documentation

## 2012-06-25 DIAGNOSIS — Z01818 Encounter for other preprocedural examination: Secondary | ICD-10-CM | POA: Diagnosis not present

## 2012-06-25 DIAGNOSIS — E876 Hypokalemia: Secondary | ICD-10-CM | POA: Diagnosis not present

## 2012-06-25 DIAGNOSIS — E785 Hyperlipidemia, unspecified: Secondary | ICD-10-CM | POA: Diagnosis not present

## 2012-06-25 DIAGNOSIS — I10 Essential (primary) hypertension: Secondary | ICD-10-CM | POA: Insufficient documentation

## 2012-06-25 HISTORY — PX: RENAL ANGIOGRAM: SHX6061

## 2012-06-25 HISTORY — PX: ABDOMINAL AORTAGRAM: SHX5454

## 2012-06-25 LAB — CBC
MCH: 30.1 pg (ref 26.0–34.0)
MCHC: 35.1 g/dL (ref 30.0–36.0)
RDW: 14.9 % (ref 11.5–15.5)

## 2012-06-25 LAB — BASIC METABOLIC PANEL
BUN: 13 mg/dL (ref 6–23)
Calcium: 9.3 mg/dL (ref 8.4–10.5)
Creatinine, Ser: 0.6 mg/dL (ref 0.50–1.35)
GFR calc Af Amer: >90 mL/min (ref >90)
GFR calc non Af Amer: >90 mL/min (ref >90)
Glucose, Bld: 118 mg/dL — ABNORMAL HIGH (ref 70–99)

## 2012-06-25 LAB — PROTIME-INR
INR: 0.94 (ref 0.00–1.49)
Prothrombin Time: 12.5 seconds (ref 11.6–15.2)

## 2012-06-25 SURGERY — ABDOMINAL AORTAGRAM
Anesthesia: LOCAL

## 2012-06-25 MED ORDER — METHYLPREDNISOLONE SODIUM SUCC 125 MG IJ SOLR
60.0000 mg | Freq: Every day | INTRAMUSCULAR | Status: AC
  Administered 2012-06-25: 60 mg via INTRAVENOUS

## 2012-06-25 MED ORDER — HYDRALAZINE HCL 50 MG PO TABS
50.0000 mg | ORAL_TABLET | Freq: Once | ORAL | Status: AC
  Administered 2012-06-25: 50 mg via ORAL
  Filled 2012-06-25: qty 1

## 2012-06-25 MED ORDER — SODIUM CHLORIDE 0.9 % IJ SOLN: 3.0000 mL | INTRAMUSCULAR | Status: DC | PRN

## 2012-06-25 MED ORDER — SODIUM CHLORIDE 0.9 % IV SOLN
INTRAVENOUS | Status: DC
  Administered 2012-06-25: 07:00:00 via INTRAVENOUS

## 2012-06-25 MED ORDER — DIAZEPAM 5 MG PO TABS
5.0000 mg | ORAL_TABLET | ORAL | Status: AC
  Administered 2012-06-25: 5 mg via ORAL
  Filled 2012-06-25: qty 1

## 2012-06-25 MED ORDER — POTASSIUM CHLORIDE CRYS ER 20 MEQ PO TBCR
EXTENDED_RELEASE_TABLET | ORAL | Status: AC
  Filled 2012-06-25: qty 2

## 2012-06-25 MED ORDER — METHYLPREDNISOLONE SODIUM SUCC 125 MG IJ SOLR
INTRAMUSCULAR | Status: AC
  Filled 2012-06-25: qty 2

## 2012-06-25 MED ORDER — DIPHENHYDRAMINE HCL 50 MG/ML IJ SOLN
25.0000 mg | Freq: Once | INTRAMUSCULAR | Status: AC
  Administered 2012-06-25: 25 mg via INTRAVENOUS

## 2012-06-25 MED ORDER — FAMOTIDINE IN NACL 20-0.9 MG/50ML-% IV SOLN
20.0000 mg | Freq: Once | INTRAVENOUS | Status: AC
  Administered 2012-06-25: 20 mg via INTRAVENOUS

## 2012-06-25 MED ORDER — ONDANSETRON HCL 4 MG/2ML IJ SOLN: 4.0000 mg | Freq: Four times a day (QID) | INTRAMUSCULAR | Status: DC | PRN

## 2012-06-25 MED ORDER — HYDRALAZINE HCL 20 MG/ML IJ SOLN
INTRAMUSCULAR | Status: AC
  Filled 2012-06-25: qty 1

## 2012-06-25 MED ORDER — SODIUM CHLORIDE 0.9 % IV SOLN: INTRAVENOUS | Status: AC

## 2012-06-25 MED ORDER — MORPHINE SULFATE 2 MG/ML IJ SOLN: 1.0000 mg | INTRAMUSCULAR | Status: DC | PRN

## 2012-06-25 MED ORDER — ASPIRIN 81 MG PO CHEW
324.0000 mg | CHEWABLE_TABLET | ORAL | Status: AC
  Administered 2012-06-25: 324 mg via ORAL
  Filled 2012-06-25: qty 4

## 2012-06-25 MED ORDER — HYDRALAZINE HCL 20 MG/ML IJ SOLN
10.0000 mg | INTRAMUSCULAR | Status: DC
  Administered 2012-06-25: 10 mg via INTRAVENOUS

## 2012-06-25 MED ORDER — HEPARIN (PORCINE) IN NACL 2-0.9 UNIT/ML-% IJ SOLN
INTRAMUSCULAR | Status: AC
  Filled 2012-06-25: qty 1000

## 2012-06-25 MED ORDER — LIDOCAINE HCL (PF) 1 % IJ SOLN
INTRAMUSCULAR | Status: AC
  Filled 2012-06-25: qty 30

## 2012-06-25 MED ORDER — POTASSIUM CHLORIDE CRYS ER 20 MEQ PO TBCR
40.0000 meq | EXTENDED_RELEASE_TABLET | Freq: Once | ORAL | Status: AC
  Administered 2012-06-25: 40 meq via ORAL

## 2012-06-25 MED ORDER — DIPHENHYDRAMINE HCL 50 MG/ML IJ SOLN
INTRAMUSCULAR | Status: AC
  Filled 2012-06-25: qty 1

## 2012-06-25 MED ORDER — ACETAMINOPHEN 325 MG PO TABS: 650.0000 mg | ORAL_TABLET | ORAL | Status: DC | PRN

## 2012-06-25 NOTE — CV Procedure (Signed)
KAISON MCPARLAND is a 77 y.o. male    [id number] LOCATION:  FACILITY: [facility]  PHYSICIAN: Nanetta Batty, M.D. [birth year]   DATE OF PROCEDURE:  06/25/2012  DATE OF DISCHARGE:  [facility]  PV ANGIOGRAM    The patient is a 77 y/o male, followed at [facility] by Dr. Alanda Amass. He has poorly controlled hypertension, and recently has been noted to have systolic blood pressures in the 200s. He has a remote history of tobacco abuse. He has known peripheral arterial disease with LFPBG by Dr. Arbie Cookey remotely. No complaints of claudication. He had a patent bypass graft in August of 2013. He does have increased velocities in the proximal, which may represent some common femoral or external iliac disease on the left, and 2-vessel runoff with occluded left peroneal on the right. He has high-grade distal SFA with velocity of 581. His last peripheral angiogram was September 2012, which showed patent left fem-pop bypass, 70% to 80% tandem right SFA stenosis, 30% to 40% left renal narrowing. Last peripheral Dopplers showed increased velocities bilaterally with RAR at 3.75 on the right and 3.46 on the left with normal kidney sizes at 13.4 cm right and 12.6 left, August 2012, and he is due for followup of this. He now presents for planned PV angiogram, to reassess the renal arteries. He denies any recent chest pain, SOB, n/v, fever, chills, melena, hematochezia and hematuria    PROCEDURE DESCRIPTION:    The patient was brought to the second floor [facility] Cardiac cath lab in the postabsorptive state. He was premedicated with Valium 5 mg by mouth, IV Benadryl, Pepcid, and Solu-Medrol for contrast allergy prophylaxis. His right groinwas prepped and shaved in usual sterile fashion. Xylocaine 1% was used for local anesthesia. A 6 French sheath was inserted into the right common femoral artery using standard Seldinger technique. A 6 French pigtail catheter was used for mid stream abdominal  aortography to visualize the renal arteries. Visipaque dye was used for the entirety of the case. Retrograde aorta aortic pressure was monitored during the case. A total of 20 cc of contrast was administered to the patient.   HEMODYNAMICS:    AO SYSTOLIC/AO DIASTOLIC: 218/59   ANGIOGRAPHIC RESULTS:   1: Abdominal aortogram-renal arteries are widely patent   IMPRESSION:Mr. Matters as essentially normal renal arteries. His renal Dopplers were also positive. He has essential hypertension. He received IV hydralazine in the lab because of hypertension for sheath removal. He'll be discharged home later today and will followup with Dr. Alphonzo Dublin.  Runell Gess MD, [facility] 06/25/2012 10:07 AM

## 2012-06-25 NOTE — Telephone Encounter (Signed)
Left message about appt date and time

## 2012-06-25 NOTE — H&P (Signed)
  H & P will be scanned in.  Pt was reexamined and existing H & P reviewed. No changes found.  Runell Gess, MD [facility] 06/25/2012 9:49 AM

## 2012-06-25 NOTE — Progress Notes (Signed)
PER LUKE KILROY,PA OK TO D/C HOME

## 2012-06-25 NOTE — Progress Notes (Signed)
Gary KILROY,PA NOTIFIED OF B/P'S AND PER Gary CLIENT NOTIFIED OFFICE WILL CALL WITH APPT FOR B/P CHECK

## 2012-06-25 NOTE — Progress Notes (Signed)
LUKE KILROY,PA NOTIFIED OF B/P'S AND ORDER NOTED

## 2012-06-25 NOTE — Progress Notes (Signed)
UP AND WALKED AND TOL WELL; RIGHT GROIN STABLE; NO BLEEDING OR HEMATOMA 

## 2012-06-25 NOTE — Progress Notes (Signed)
JENNIFER,RN NOTIFIED OF K+ AND SHE WILL LET DR Allyson Sabal KNOW

## 2012-06-25 NOTE — Progress Notes (Signed)
IN AND OUT CATH DONE EARLIER BY RON MOTLEY,NT AND 300CC OUT; CLIENT UP TO BATHROOM AFTER BEDREST AND VOIDED

## 2012-06-25 NOTE — H&P (Signed)
Chief Complaint: Planned PV Angiogram to Assess Renal Arteries.  HPI: The patient is a 77 y/o male, followed at [facility] by Dr. Alanda Amass. He has poorly controlled hypertension, and recently has been noted to have systolic blood pressures in the 200s. He has a remote history of tobacco abuse. He has known peripheral arterial disease with LFPBG by Dr. Arbie Cookey remotely. No complaints of claudication. He had a patent bypass graft in August of 2013. He does have increased velocities in the proximal, which may represent some common femoral or external iliac disease on the left, and 2-vessel runoff with occluded left peroneal on the right. He has high-grade distal SFA with velocity of 581. His last peripheral angiogram was September 2012, which showed patent left fem-pop bypass, 70% to 80% tandem right SFA stenosis, 30% to 40% left renal narrowing. Last peripheral Dopplers showed increased velocities bilaterally with RAR at 3.75 on the right and 3.46 on the left with normal kidney sizes at 13.4 cm right and 12.6 left, August 2012, and he is due for followup of this. He now presents for planned PV angiogram, to reassess the renal arteries. He denies any recent chest pain, SOB, n/v, fever, chills, melena, hematochezia and hematuria.   Past Medical History  Diagnosis Date  . COPD (chronic obstructive pulmonary disease)   . Hypertension   . PVD (peripheral vascular disease) 04/27/2011    Normal Renal & abdominal Aortography; s/p Left Fem-Pop BPG 2004.  Marland Kitchen Dyslipidemia   . Coronary artery disease 04/27/2011    Low risk Myoview in 2010:Non-obstructive disase, except for small OMB 95% ostial lesion    Past Surgical History  Procedure Laterality Date  . Foot surgery    . Nasal sinus surgery    . Femoral-popliteal bypass graft  2004    Left sided    Family History  Problem Relation Age of Onset  . CAD Brother   . Hypertension Father   . Hypertension Brother      X 2  . Cancer - Other Mother     Kidney    Social History:  reports that he has quit smoking. His smoking use included Cigarettes. He has a 50 pack-year smoking history. He quit smokeless tobacco use about 14 years ago. He reports that he drinks about 1.2 ounces of alcohol per week. He reports that he does not use illicit drugs.  Allergies:  Allergies  Allergen Reactions  . Contrast Media (Iodinated Diagnostic Agents) Itching  . Neosporin (Neomycin-Bacitracin Zn-Polymyx) Hives  . Neomycin Hives    Medications Prior to Admission  Medication Sig Dispense Refill  . amLODipine (NORVASC) 10 MG tablet Take 10 mg by mouth daily.      Marland Kitchen aspirin 81 MG tablet Take 81 mg by mouth daily.      . Choline Fenofibrate (TRILIPIX) 135 MG capsule Take 135 mg by mouth daily.      Marland Kitchen co-enzyme Q-10 30 MG capsule Take 30 mg by mouth daily.      . enalapril (VASOTEC) 20 MG tablet Take 20 mg by mouth daily.      Marland Kitchen FLUoxetine (PROZAC) 10 MG tablet Take 1 tablet (10 mg total) by mouth daily.  30 tablet  0  . folic acid (FOLVITE) 1 MG tablet Take 1 tablet (1 mg total) by mouth daily.  30 tablet  0  . hydrALAZINE (APRESOLINE) 50 MG tablet Take 1 tablet (50 mg total) by mouth 3 (three) times daily.  90 tablet  0  . metoprolol succinate (TOPROL-XL)  25 MG 24 hr tablet Take 25 mg by mouth daily.      Marland Kitchen omeprazole (PRILOSEC) 20 MG capsule Take 20 mg by mouth daily.      . rosuvastatin (CRESTOR) 20 MG tablet Take 20 mg by mouth daily.      . vitamin B-12 (CYANOCOBALAMIN) 100 MCG tablet Take 100 mcg by mouth daily.      Marland Kitchen albuterol (PROVENTIL HFA;VENTOLIN HFA) 108 (90 BASE) MCG/ACT inhaler Inhale 2 puffs into the lungs every 4 (four) hours as needed for wheezing.  1 Inhaler  12    Results for orders placed during the hospital encounter of 06/25/12 (from the past 48 hour(s))  BASIC METABOLIC PANEL     Status: Abnormal   Collection Time    06/25/12  7:16 AM      Result Value Range   Sodium 138  135 - 145 mEq/L   Potassium 3.1 (*) 3.5 - 5.1 mEq/L   Chloride  103  96 - 112 mEq/L   CO2 21  19 - 32 mEq/L   Glucose, Bld 118 (*) 70 - 99 mg/dL   BUN 13  6 - 23 mg/dL   Creatinine, Ser 1.32  0.50 - 1.35 mg/dL   Calcium 9.3  8.4 - 44.0 mg/dL   GFR calc non Af Amer >90  >90 mL/min   GFR calc Af Amer >90  >90 mL/min   Comment:            The eGFR has been calculated     using the CKD EPI equation.     This calculation has not been     validated in all clinical     situations.     eGFR's persistently     <90 mL/min signify     possible Chronic Kidney Disease.  CBC     Status: None   Collection Time    06/25/12  7:16 AM      Result Value Range   WBC 6.0  4.0 - 10.5 K/uL   RBC 4.79  4.22 - 5.81 MIL/uL   Hemoglobin 14.4  13.0 - 17.0 g/dL   HCT 10.2  72.5 - 36.6 %   MCV 85.6  78.0 - 100.0 fL   MCH 30.1  26.0 - 34.0 pg   MCHC 35.1  30.0 - 36.0 g/dL   RDW 44.0  34.7 - 42.5 %   Platelets 217  150 - 400 K/uL  PROTIME-INR     Status: None   Collection Time    06/25/12  7:16 AM      Result Value Range   Prothrombin Time 12.5  11.6 - 15.2 seconds   INR 0.94  0.00 - 1.49   Dg Chest 2 View  06/25/2012   *RADIOLOGY REPORT*  Clinical Data: Preprocedural study prior to abdominal and renal angiogram.  CHEST - 2 VIEW  Comparison: 06/05/2012.  Findings: Opacity at the base of the left hemithorax is similar to the prior examination and may reflect atelectasis and/or consolidation, with superimposed small left pleural effusion (slightly decreased).  Right lung appears clear.  Mild coarsening of interstitial markings diffusely is similar to prior examinations and appears to be chronic.  No evidence of pulmonary edema.  Mild bilateral apical pleural parenchymal scarring similar to prior examinations.  Heart size is within normal limits.  Mediastinal contours are unremarkable.  Atherosclerosis of the thoracic aorta.  IMPRESSION: 1.  Slight decrease in size of a small left pleural effusion compared to prior examination.  The radiographic appearance of chest is  otherwise essentially unchanged, as above.   Original Report Authenticated By: Trudie Reed, M.D.    Review of Systems  Constitutional: Negative for fever, chills and diaphoresis.  Respiratory: Negative for shortness of breath.   Cardiovascular: Negative for chest pain, orthopnea, claudication, leg swelling and PND.  Gastrointestinal: Negative for nausea, vomiting, blood in stool and melena.  Genitourinary: Negative for hematuria.  Neurological: Negative for dizziness and loss of consciousness.    Blood pressure 217/74, pulse 64, temperature 97 F (36.1 C), temperature source Oral, resp. rate 18, height 5\' 8"  (1.727 m), weight 155 lb (70.308 kg), SpO2 97.00%. Physical Exam  Constitutional: He is oriented to person, place, and time. He appears well-developed and well-nourished. No distress.  HENT:  Head: Normocephalic and atraumatic.  Eyes: Conjunctivae and EOM are normal. Pupils are equal, round, and reactive to light.  Neck: No JVD present. Carotid bruit is present (bilateral ).  Cardiovascular: Normal rate, regular rhythm and intact distal pulses.  Exam reveals no gallop and no friction rub.   Murmur heard.  Systolic murmur is present with a grade of 2/6  Pulses:      Carotid pulses are on the right side with bruit, and on the left side with bruit.      Radial pulses are 2+ on the right side, and 2+ on the left side.       Femoral pulses are on the left side with bruit.      Dorsalis pedis pulses are 1+ on the right side, and 1+ on the left side.  Respiratory: Effort normal and breath sounds normal. No respiratory distress. He has no wheezes. He has no rales. He exhibits no tenderness.  GI: Soft. Bowel sounds are normal. He exhibits no distension and no mass. There is no tenderness.  Musculoskeletal: He exhibits no edema.  Lymphadenopathy:    He has no cervical adenopathy.  Neurological: He is alert and oriented to person, place, and time.  Skin: Skin is warm and dry. He is not  diaphoretic.  Psychiatric: He has a normal mood and affect. His behavior is normal.     Assessment/Plan Active Problems:   HTN    PVD, LFBPG 2004, dopplers OK 8/12   HLD (hyperlipidemia)  Plan: Plan for PV Angiogram with Dr. Allyson Sabal to assess the renal arteries for ? RAS. BP is 217/74. Cath lab to administer IV hydralazine. Pt has reported contrast dye allergy. Will give Benadryl, Solu-medrol and Pepcid for prophylaxis.  He is hypokalemic w/ K+ of 3.1. Will replete with supplemental KCl. Dr.Naveen Clardy to follow with PV angio report.   Allayne Butcher, PA-C 06/25/2012, 8:51 AM   Agree with note written by Boyce Medici  PAC  Pt with uncontrolled HTN and abn renal dopplers referred for renal angio and potential intervention for presumed renal vascular HTN. His K was 3.1 and he was repleted. He was given meds for contrast allergy prophyaxix.   Runell Gess 06/25/2012 9:47 AM

## 2012-07-03 ENCOUNTER — Encounter: Payer: Self-pay | Admitting: Pharmacist Clinician (PhC)/ Clinical Pharmacy Specialist

## 2012-07-08 ENCOUNTER — Encounter: Payer: Self-pay | Admitting: Pharmacist Clinician (PhC)/ Clinical Pharmacy Specialist

## 2012-07-08 ENCOUNTER — Ambulatory Visit (INDEPENDENT_AMBULATORY_CARE_PROVIDER_SITE_OTHER): Payer: Medicare Other | Admitting: Pharmacist Clinician (PhC)/ Clinical Pharmacy Specialist

## 2012-07-08 VITALS — BP 176/60 | HR 64

## 2012-07-08 DIAGNOSIS — I10 Essential (primary) hypertension: Secondary | ICD-10-CM

## 2012-07-08 MED ORDER — ENALAPRIL MALEATE 20 MG PO TABS: 20.0000 mg | ORAL_TABLET | Freq: Two times a day (BID) | ORAL | Status: DC

## 2012-07-08 NOTE — Patient Instructions (Addendum)
Your blood pressure today is high at 176/60 Check your blood pressure at home daily (if able) and keep record of the readings.  Take your BP meds as follows:  AM:   Enalapril 20mg    Metoprolol er 25mg    Hydralazine 50mg         LUNCH:  Hydralazine 50mg     PM:      Amlodipine 10mg    Hydralazine 50mg    Enalapril 20mg     Bring all of your meds, your BP cuff and your record of home blood pressures to your next appointment.  Exercise as you're able, try to walk approximately 30 minutes per day.  Keep salt intake to a minimum, especially watch canned and prepared boxed foods.  Eat more fresh fruits and vegetables and fewer canned items.  Avoid eating in fast food restaurants.     HOW TO TAKE YOUR BLOOD PRESSURE:   Rest 5 minutes before taking your blood pressure.    Don't smoke or drink caffeinated beverages for at least 30 minutes before.   Take your blood pressure before (not after) you eat.   Sit comfortably with your back supported and both feet on the floor (don't cross your legs).   Elevate your arm to heart level on a table or a desk.   Use the proper sized cuff. It should fit smoothly and snugly around your bare upper arm. There should be enough room to slip a fingertip under the cuff. The bottom edge of the cuff should be 1 inch above the crease of the elbow.   Ideally, take 3 measurements at one sitting and record the average.

## 2012-07-08 NOTE — Progress Notes (Signed)
HPI:  Gary [name] is a 77 y.o. male with a PMH below who presents today for a follow up blood pressure check.  On May 20 he had an abdominal aortogram, which showed widely patent renal arteries.  However on that day his blood pressure spiked to 200/76.  He states that he has a home BP cuff, although he believes it to be inaccurate and thus has not checked his pressures with it.  Pt states compliance with meds, states took all meds this am prior to appointment.  He denies using NSAIDs and a TSH done in April of 2013 was WNL.      Current Outpatient Prescriptions  Medication Sig Dispense Refill  . albuterol (PROVENTIL HFA;VENTOLIN HFA) 108 (90 BASE) MCG/ACT inhaler Inhale 2 puffs into the lungs every 4 (four) hours as needed for wheezing.  1 Inhaler  12  . amLODipine (NORVASC) 10 MG tablet Take 10 mg by mouth daily.      Marland Kitchen aspirin 81 MG tablet Take 81 mg by mouth daily.      . Choline Fenofibrate (TRILIPIX) 135 MG capsule Take 135 mg by mouth daily.      Marland Kitchen co-enzyme Q-10 30 MG capsule Take 30 mg by mouth daily.      . enalapril (VASOTEC) 20 MG tablet Take 20 mg by mouth daily.      Marland Kitchen FLUoxetine (PROZAC) 10 MG tablet Take 1 tablet (10 mg total) by mouth daily.  30 tablet  0  . folic acid (FOLVITE) 1 MG tablet Take 1 tablet (1 mg total) by mouth daily.  30 tablet  0  . hydrALAZINE (APRESOLINE) 50 MG tablet Take 1 tablet (50 mg total) by mouth 3 (three) times daily.  90 tablet  0  . metoprolol succinate (TOPROL-XL) 25 MG 24 hr tablet Take 25 mg by mouth daily.      Marland Kitchen omeprazole (PRILOSEC) 20 MG capsule Take 20 mg by mouth daily.      . rosuvastatin (CRESTOR) 20 MG tablet Take 20 mg by mouth daily.      . vitamin B-12 (CYANOCOBALAMIN) 100 MCG tablet Take 100 mcg by mouth daily.       No current facility-administered medications for this visit.    Allergies  Allergen Reactions  . Contrast Media (Iodinated Diagnostic Agents) Itching  . Neosporin (Neomycin-Bacitracin Zn-Polymyx) Hives  .  Neomycin Hives    Past Medical History  Diagnosis Date  . COPD (chronic obstructive pulmonary disease)   . Hypertension   . PVD (peripheral vascular disease) 04/27/2011    Normal Renal & abdominal Aortography; s/p Left Fem-Pop BPG 2004.  Marland Kitchen Dyslipidemia   . Coronary artery disease 04/27/2011    Low risk Myoview in 2010:Non-obstructive disase, except for small OMB 95% ostial lesion    BP 176/60  Pulse 64  ASSESSMENT AND PLAN:  Mr. Bendickson blood pressure is still elevated today at 176/60 and 170/60 by repeat check.  Heart rate stable at 64.  We will increase his enalapril today to 20mg  bid and adjusted time of day to take all meds.  He will now take hydralazine 50mg , enalapril 20mg  and metoprolol er 25mg  each am; hydralazine 50mg  at mid-day; and hydralazine 50mg , enalapril 20mg  and amlodipine 10mg  all at night.  Pt was given written instructions for this as well as information on how to take his blood pressure.  I have asked him to watch his sodium intake, he states he uses a salt-substitute much of the time.  I have  asked that he check his home BP daily and keep record of the results.  He is to bring the results as well as the cuff itself to his next appointment for calibration.  He is to follow up with Dr. Alanda Amass or myself in 4-6 weeks.

## 2012-07-23 ENCOUNTER — Other Ambulatory Visit: Payer: Self-pay | Admitting: Cardiovascular Disease

## 2012-07-23 ENCOUNTER — Encounter: Payer: Self-pay | Admitting: Cardiovascular Disease

## 2012-07-23 ENCOUNTER — Ambulatory Visit (INDEPENDENT_AMBULATORY_CARE_PROVIDER_SITE_OTHER): Payer: Medicare Other | Admitting: Internal Medicine

## 2012-07-23 ENCOUNTER — Ambulatory Visit (INDEPENDENT_AMBULATORY_CARE_PROVIDER_SITE_OTHER)
Admission: RE | Admit: 2012-07-23 | Discharge: 2012-07-23 | Disposition: A | Discharge status: Home / Self Care | Payer: Medicare Other | Source: Ambulatory Visit | Attending: Internal Medicine | Admitting: Internal Medicine

## 2012-07-23 ENCOUNTER — Encounter: Payer: Self-pay | Admitting: Internal Medicine

## 2012-07-23 VITALS — BP 154/68 | HR 67 | Temp 97.2°F | Ht 68.0 in | Wt 138.0 lb

## 2012-07-23 DIAGNOSIS — M549 Dorsalgia, unspecified: Secondary | ICD-10-CM

## 2012-07-23 DIAGNOSIS — G4700 Insomnia, unspecified: Secondary | ICD-10-CM

## 2012-07-23 DIAGNOSIS — R5383 Other fatigue: Secondary | ICD-10-CM

## 2012-07-23 DIAGNOSIS — R5381 Other malaise: Secondary | ICD-10-CM | POA: Diagnosis not present

## 2012-07-23 DIAGNOSIS — K219 Gastro-esophageal reflux disease without esophagitis: Secondary | ICD-10-CM | POA: Diagnosis not present

## 2012-07-23 DIAGNOSIS — R6889 Other general symptoms and signs: Secondary | ICD-10-CM | POA: Diagnosis not present

## 2012-07-23 DIAGNOSIS — E782 Mixed hyperlipidemia: Secondary | ICD-10-CM | POA: Diagnosis not present

## 2012-07-23 DIAGNOSIS — I7389 Other specified peripheral vascular diseases: Secondary | ICD-10-CM | POA: Diagnosis not present

## 2012-07-23 DIAGNOSIS — J449 Chronic obstructive pulmonary disease, unspecified: Secondary | ICD-10-CM | POA: Diagnosis not present

## 2012-07-23 DIAGNOSIS — N429 Disorder of prostate, unspecified: Secondary | ICD-10-CM | POA: Diagnosis not present

## 2012-07-23 LAB — CBC WITH DIFFERENTIAL/PLATELET
Basophils Absolute: 0 10*3/uL (ref 0.0–0.1)
Lymphocytes Relative: 18 % (ref 12–46)
Neutro Abs: 5.6 10*3/uL (ref 1.7–7.7)
Platelets: 279 10*3/uL (ref 150–400)
RBC: 5.42 MIL/uL (ref 4.22–5.81)
RDW: 14.9 % (ref 11.5–15.5)
WBC: 8 10*3/uL (ref 4.0–10.5)

## 2012-07-23 LAB — COMPREHENSIVE METABOLIC PANEL
ALT: 32 U/L (ref 0–53)
AST: 39 U/L — ABNORMAL HIGH (ref 0–37)
Albumin: 3.7 g/dL (ref 3.5–5.2)
Calcium: 9.2 mg/dL (ref 8.4–10.5)
Chloride: 97 mEq/L (ref 96–112)
Potassium: 3.6 mEq/L (ref 3.5–5.3)
Sodium: 137 mEq/L (ref 135–145)
Total Protein: 6.3 g/dL (ref 6.0–8.3)

## 2012-07-23 LAB — LIPID PANEL: Total CHOL/HDL Ratio: 3.8 Ratio

## 2012-07-23 LAB — TSH: TSH: 2.114 u[IU]/mL (ref 0.350–4.500)

## 2012-07-23 MED ORDER — PANTOPRAZOLE SODIUM 40 MG PO TBEC: 40.0000 mg | DELAYED_RELEASE_TABLET | Freq: Every day | ORAL | Status: DC

## 2012-07-23 MED ORDER — ALPRAZOLAM 0.5 MG PO TABS: 0.5000 mg | ORAL_TABLET | Freq: Every evening | ORAL | Status: DC | PRN

## 2012-07-23 NOTE — Patient Instructions (Signed)

## 2012-07-23 NOTE — Progress Notes (Signed)
Subjective:    Patient ID: Gary [name], male    DOB: 1935/06/18, 77 y.o.   MRN: [id number]  HPI  Pt presents to the clinic today with c./o stomach pain. This has been a chronic issue for him, related to reflux. He has been on prilosec. He only takes it when needed. His symptoms have gotten worse in the last few weeks. The burning pain is constant. He does not think the prilosec is working. He has lost 17 lbs in the last month. His appetitie is good but he is taking in less food due to the pain. He is having normal BM's. He has not seen any blood in his stool.  Additionally, he is not taking the prozac. He felt as if it was effective. It was not helping him sleep any. He would like something he could take on a intermittent basis that would be more effective.   Review of Systems      Past Medical History  Diagnosis Date  . COPD (chronic obstructive pulmonary disease)   . Hypertension   . PVD (peripheral vascular disease) 04/27/2011    Normal Renal & abdominal Aortography; s/p Left Fem-Pop BPG 2004.  Marland Kitchen Dyslipidemia   . Coronary artery disease 04/27/2011    Low risk Myoview in 2010:Non-obstructive disase, except for small OMB 95% ostial lesion    Current Outpatient Prescriptions  Medication Sig Dispense Refill  . albuterol (PROVENTIL HFA;VENTOLIN HFA) 108 (90 BASE) MCG/ACT inhaler Inhale 2 puffs into the lungs every 4 (four) hours as needed for wheezing.  1 Inhaler  12  . amLODipine (NORVASC) 10 MG tablet Take 10 mg by mouth daily.      Marland Kitchen aspirin 81 MG tablet Take 81 mg by mouth daily.      . Choline Fenofibrate (TRILIPIX) 135 MG capsule Take 135 mg by mouth daily.      Marland Kitchen co-enzyme Q-10 30 MG capsule Take 30 mg by mouth daily.      . enalapril (VASOTEC) 20 MG tablet Take 1 tablet (20 mg total) by mouth 2 (two) times daily.  60 tablet  6  . FLUoxetine (PROZAC) 10 MG tablet Take 1 tablet (10 mg total) by mouth daily.  30 tablet  0  . folic acid (FOLVITE) 1 MG tablet Take 1 tablet (1 mg  total) by mouth daily.  30 tablet  0  . hydrALAZINE (APRESOLINE) 50 MG tablet Take 1 tablet (50 mg total) by mouth 3 (three) times daily.  90 tablet  0  . metoprolol succinate (TOPROL-XL) 25 MG 24 hr tablet Take 25 mg by mouth daily.      Marland Kitchen omeprazole (PRILOSEC) 20 MG capsule Take 20 mg by mouth daily.      . rosuvastatin (CRESTOR) 20 MG tablet Take 20 mg by mouth daily.      . vitamin B-12 (CYANOCOBALAMIN) 100 MCG tablet Take 100 mcg by mouth daily.       No current facility-administered medications for this visit.    Allergies  Allergen Reactions  . Contrast Media (Iodinated Diagnostic Agents) Itching  . Neosporin (Neomycin-Bacitracin Zn-Polymyx) Hives  . Neomycin Hives    Family History  Problem Relation Age of Onset  . CAD Brother   . Hypertension Father   . Hypertension Brother      X 2  . Cancer - Other Mother     Kidney    History   Social History  . Marital Status: Divorced    Spouse Name: N/A  Number of Children: 3  . Years of Education: N/A   Occupational History  . welder, pipe fitter    Social History Main Topics  . Smoking status: Former Smoker -- 1.00 packs/day for 50 years    Types: Cigarettes  . Smokeless tobacco: Former Neurosurgeon    Quit date: 04/27/1998  . Alcohol Use: 1.2 oz/week    2 Cans of beer per week  . Drug Use: No  . Sexually Active: No   Other Topics Concern  . Not on file   Social History Narrative  . No narrative on file     Constitutional: Denies fever, malaise, fatigue, headache or abrupt weight changes.  Respiratory: Denies difficulty breathing, shortness of breath, cough or sputum production.   Cardiovascular: Denies chest pain, chest tightness, palpitations or swelling in the hands or feet.  Gastrointestinal: Pt reports abdominal pain. Denies bloating, constipation, diarrhea or blood in the stool.  Musculoskeletal: Pt reports recurrent back pain.  Denies decrease in range of motion, difficulty with gait, or joint pain and  swelling.   Neurological: Pt reports insomnia. Denies dizziness, difficulty with memory, difficulty with speech or problems with balance and coordination.   No other specific complaints in a complete review of systems (except as listed in HPI above).  Objective:   Physical Exam  BP 154/68  Pulse 67  Temp(Src) 97.2 F (36.2 C) (Oral)  Ht 5\' 8"  (1.727 m)  Wt 138 lb (62.596 kg)  BMI 20.99 kg/m2  SpO2 98% Wt Readings from Last 3 Encounters:  07/23/12 138 lb (62.596 kg)  06/25/12 155 lb (70.308 kg)  06/25/12 155 lb (70.308 kg)    General: Appears his stated age, well developed, well nourished in NAD. Skin: Warm, dry and intact. No rashes, lesions or ulcerations noted.  Cardiovascular: Normal rate and rhythm. S1,S2 noted.  No murmur, rubs or gallops noted. No JVD or BLE edema. No carotid bruits noted. Pulmonary/Chest: Normal effort and positive vesicular breath sounds. No respiratory distress. No wheezes, rales or ronchi noted.  Abdomen: Soft and tender in the epigastric region. Normal bowel sounds, no bruits noted. No distention or masses noted. Liver, spleen and kidneys non palpable. Musculoskeletal: Normal range of motion. No signs of joint swelling. No difficulty with gait. Tender at the paraspinal muscles between the thoracic vertebrae. Neurological: Alert and oriented. Cranial nerves II-XII intact. Coordination normal. +DTRs bilaterally.   BMET    Component Value Date/Time   NA 138 06/25/2012 0716   K 3.1* 06/25/2012 0716   CL 103 06/25/2012 0716   CO2 21 06/25/2012 0716   GLUCOSE 118* 06/25/2012 0716   BUN 13 06/25/2012 0716   CREATININE 0.60 06/25/2012 0716   CREATININE 0.62 03/09/2012 1119   CALCIUM 9.3 06/25/2012 0716   GFRNONAA >90 06/25/2012 0716   GFRAA >90 06/25/2012 0716    Lipid Panel     Component Value Date/Time   CHOL 203* 05/01/2012 1018   TRIG 118.0 05/01/2012 1018   HDL 60.80 05/01/2012 1018   CHOLHDL 3 05/01/2012 1018   VLDL 23.6 05/01/2012 1018    CBC     Component Value Date/Time   WBC 6.0 06/25/2012 0716   RBC 4.79 06/25/2012 0716   HGB 14.4 06/25/2012 0716   HCT 41.0 06/25/2012 0716   PLT 217 06/25/2012 0716   MCV 85.6 06/25/2012 0716   MCH 30.1 06/25/2012 0716   MCHC 35.1 06/25/2012 0716   RDW 14.9 06/25/2012 0716   LYMPHSABS 1.2 05/01/2012 1018   MONOABS 1.0  05/01/2012 1018   EOSABS 0.1 05/01/2012 1018   BASOSABS 0.0 05/01/2012 1018    Hgb A1C No results found for this basename: HGBA1C         Assessment & Plan:   Recurrent back pain:  Will check chest xray to make sure no lung etiology as before (CAP)   GERD with weight loss:  Stop prilosec and start protonix eRx provided Make appointment with GI to discuss GERD and weight loss  Insomnia:  Stop prozac Will start xanax as needed for sleep

## 2012-07-24 LAB — CEA: CEA: 1.4 ng/mL (ref 0.0–5.0)

## 2012-07-25 ENCOUNTER — Encounter: Payer: Self-pay | Admitting: Cardiovascular Disease

## 2012-07-29 ENCOUNTER — Other Ambulatory Visit: Payer: Self-pay | Admitting: Cardiovascular Disease

## 2012-07-29 DIAGNOSIS — I739 Peripheral vascular disease, unspecified: Secondary | ICD-10-CM

## 2012-07-29 DIAGNOSIS — J441 Chronic obstructive pulmonary disease with (acute) exacerbation: Secondary | ICD-10-CM

## 2012-07-29 DIAGNOSIS — I10 Essential (primary) hypertension: Secondary | ICD-10-CM

## 2012-07-31 ENCOUNTER — Encounter: Payer: Self-pay | Admitting: Cardiovascular Disease

## 2012-08-05 ENCOUNTER — Telehealth: Payer: Self-pay | Admitting: Cardiovascular Disease

## 2012-08-05 NOTE — Telephone Encounter (Signed)
Paper chart requested.

## 2012-08-05 NOTE — Telephone Encounter (Signed)
She thinks her father already had an ultra sound recently-he is scheduled for another one next week(08-13-12)lshe wants you (J C) to call-she does not think he needs this one!

## 2012-08-06 HISTORY — PX: OTHER SURGICAL HISTORY: SHX169

## 2012-08-06 NOTE — Telephone Encounter (Signed)
Chart reviewed and per Dr. Kandis Cocking last OV note in regards to Dopplers, pt's results may have been "artifactual or related to his hypertension so this will need ot be followed up again since his blood pressure is under better control."    Returned call to [name] and informed.  Stated she understands that, but doesn't understand why he still needs to get it done.  Stated her sister who is in the medical field raised the question and she will talk to her about it.  [name] informed Dr. Alanda Amass is out of the office until Thursday and this message w/ pt's chart will be left for him to review.  Verbalized understanding.  Message forwarded to [facility]. Berlinda Last, LPN to discuss w/ Dr. Alanda Amass.  This note and paper chart# [id number] placed on Dr. Kandis Cocking cart.

## 2012-08-13 ENCOUNTER — Encounter (HOSPITAL_COMMUNITY): Payer: Medicare Other

## 2012-08-13 ENCOUNTER — Ambulatory Visit (HOSPITAL_COMMUNITY): Payer: Medicare Other

## 2012-08-14 ENCOUNTER — Ambulatory Visit (HOSPITAL_COMMUNITY)
Admission: RE | Admit: 2012-08-14 | Discharge: 2012-08-14 | Disposition: A | Discharge status: Home / Self Care | Payer: Medicare Other | Source: Ambulatory Visit | Attending: Cardiovascular Disease | Admitting: Cardiovascular Disease

## 2012-08-14 DIAGNOSIS — J449 Chronic obstructive pulmonary disease, unspecified: Secondary | ICD-10-CM | POA: Diagnosis not present

## 2012-08-14 DIAGNOSIS — I2789 Other specified pulmonary heart diseases: Secondary | ICD-10-CM | POA: Insufficient documentation

## 2012-08-14 DIAGNOSIS — J441 Chronic obstructive pulmonary disease with (acute) exacerbation: Secondary | ICD-10-CM

## 2012-08-14 DIAGNOSIS — R0602 Shortness of breath: Secondary | ICD-10-CM | POA: Diagnosis not present

## 2012-08-14 DIAGNOSIS — I739 Peripheral vascular disease, unspecified: Secondary | ICD-10-CM

## 2012-08-14 DIAGNOSIS — I70219 Atherosclerosis of native arteries of extremities with intermittent claudication, unspecified extremity: Secondary | ICD-10-CM | POA: Diagnosis not present

## 2012-08-14 DIAGNOSIS — I10 Essential (primary) hypertension: Secondary | ICD-10-CM

## 2012-08-14 DIAGNOSIS — J4489 Other specified chronic obstructive pulmonary disease: Secondary | ICD-10-CM | POA: Diagnosis not present

## 2012-08-14 HISTORY — PX: TRANSTHORACIC ECHOCARDIOGRAM: SHX275

## 2012-08-14 NOTE — Progress Notes (Signed)
Arterial Duplex Lower Ext. Completed. Damisha Wolff, RDMS, RVT  

## 2012-08-14 NOTE — Progress Notes (Signed)
[facility]   2D echo completed 08/14/2012.   Cindy Crystalynn Mcinerney, RDCS  

## 2012-08-16 NOTE — Telephone Encounter (Signed)
Talked with amy whom is the pts. Daughter and explained to her the difference between the arterial doppler and the venous doppler johnny had back in feb. Of this year.

## 2012-08-19 DIAGNOSIS — H6120 Impacted cerumen, unspecified ear: Secondary | ICD-10-CM | POA: Diagnosis not present

## 2012-08-23 NOTE — Telephone Encounter (Signed)
Called and talked to pt.s daughter; daughter stated Gary [name] was still drinking a lot of alcohol and was not taking his bp meds and  Most of the time his bp was very elevated. Daughter has pleaded with her father to take his meds and stop drinking so much but he continues to be resistant

## 2012-08-30 ENCOUNTER — Other Ambulatory Visit: Payer: Self-pay | Admitting: Gastroenterology

## 2012-08-30 DIAGNOSIS — R109 Unspecified abdominal pain: Secondary | ICD-10-CM

## 2012-08-30 DIAGNOSIS — R634 Abnormal weight loss: Secondary | ICD-10-CM | POA: Diagnosis not present

## 2012-09-03 ENCOUNTER — Ambulatory Visit
Admission: RE | Admit: 2012-09-03 | Discharge: 2012-09-03 | Disposition: A | Discharge status: Home / Self Care | Payer: Medicare Other | Source: Ambulatory Visit | Attending: Gastroenterology | Admitting: Gastroenterology

## 2012-09-03 DIAGNOSIS — R634 Abnormal weight loss: Secondary | ICD-10-CM

## 2012-09-03 DIAGNOSIS — R109 Unspecified abdominal pain: Secondary | ICD-10-CM | POA: Diagnosis not present

## 2012-09-03 MED ORDER — IOHEXOL 300 MG/ML  SOLN
100.0000 mL | Freq: Once | INTRAMUSCULAR | Status: AC | PRN
  Administered 2012-09-03: 100 mL via INTRAVENOUS

## 2012-09-06 ENCOUNTER — Other Ambulatory Visit: Payer: Self-pay | Admitting: Gastroenterology

## 2012-09-06 DIAGNOSIS — R109 Unspecified abdominal pain: Secondary | ICD-10-CM

## 2012-09-12 ENCOUNTER — Ambulatory Visit
Admission: RE | Admit: 2012-09-12 | Discharge: 2012-09-12 | Disposition: A | Discharge status: Home / Self Care | Payer: Medicare Other | Source: Ambulatory Visit | Attending: Gastroenterology | Admitting: Gastroenterology

## 2012-09-12 DIAGNOSIS — R109 Unspecified abdominal pain: Secondary | ICD-10-CM | POA: Diagnosis not present

## 2012-09-19 ENCOUNTER — Other Ambulatory Visit: Payer: Self-pay | Admitting: Cardiovascular Disease

## 2012-09-19 MED ORDER — HYDRALAZINE HCL 50 MG PO TABS: 50.0000 mg | ORAL_TABLET | Freq: Three times a day (TID) | ORAL | Status: DC

## 2012-09-19 MED ORDER — FENOFIBRIC ACID 135 MG PO CPDR: 1.0000 | DELAYED_RELEASE_CAPSULE | Freq: Every day | ORAL | Status: DC

## 2012-09-19 MED ORDER — AMLODIPINE BESYLATE 10 MG PO TABS: 10.0000 mg | ORAL_TABLET | Freq: Every day | ORAL | Status: DC

## 2012-09-19 MED ORDER — ROSUVASTATIN CALCIUM 20 MG PO TABS: 20.0000 mg | ORAL_TABLET | Freq: Every day | ORAL | Status: DC

## 2012-09-19 MED ORDER — FOLIC ACID 1 MG PO TABS: 1.0000 mg | ORAL_TABLET | Freq: Every day | ORAL | Status: DC

## 2012-09-19 NOTE — Telephone Encounter (Signed)
Rx was sent to pharmacy electronically via [employer].  

## 2012-10-15 ENCOUNTER — Telehealth: Payer: Self-pay | Admitting: Cardiovascular Disease

## 2012-10-15 NOTE — Telephone Encounter (Signed)
Pt's daughter is calling in regards to his Norvasc making his feet swell because it has caused it before. He wants to stop taking this medication.

## 2012-10-15 NOTE — Telephone Encounter (Signed)
JC, LPN notified as he and Dr. Alanda Amass are in the [place] office.  Per JC, Dr. Alanda Amass wants pt to decrease amlodipine to 5 mg daily and come in for BP check.    Returned call to [name], pt's father.  Stated pt showed her over the weekend that his feet are swollen and he thinks it's b/c of the amlodipine.  Stated he said he is going to stop it and she told him he couldn't.  Wanted to know if it can changed or decreased.  [name] informed Dr. Lorelei Pont has been notified and advised pt decrease amlodipine to 5mg  daily (1/2 of 10mg  tab) and come in for BP check.  Also advised pt elevate LEs when sitting or lying down and call back for any changes in symptoms.  Verbalized understanding and transferred to scheduling to set up appt w/ Ext[facility].

## 2012-10-21 ENCOUNTER — Encounter: Payer: Self-pay | Admitting: Physician Assistant

## 2012-10-21 ENCOUNTER — Ambulatory Visit (INDEPENDENT_AMBULATORY_CARE_PROVIDER_SITE_OTHER): Payer: Medicare Other | Admitting: Physician Assistant

## 2012-10-21 VITALS — BP 170/80 | HR 60 | Ht 67.0 in | Wt 133.0 lb

## 2012-10-21 DIAGNOSIS — I10 Essential (primary) hypertension: Secondary | ICD-10-CM

## 2012-10-21 MED ORDER — AMLODIPINE BESYLATE 5 MG PO TABS: 5.0000 mg | ORAL_TABLET | Freq: Every day | ORAL | Status: DC

## 2012-10-21 MED ORDER — HYDRALAZINE HCL 50 MG PO TABS: 75.0000 mg | ORAL_TABLET | Freq: Three times a day (TID) | ORAL | Status: DC

## 2012-10-21 NOTE — Progress Notes (Signed)
Date:  10/21/2012   ID:  Gary [name], DOB 1935/04/15, MRN [id number]  PCP:  Nicki Reaper, NP  Primary Cardiologist:  Jabs     History of Present Illness: Gary [name] is a 77 y.o. male  history of tobacco abuse but quit smoking almost 16 years ago.  His history also includes hypertension, EtOH abuse, COPD, PAD, dyslipidemia, status post LFPPD by Dr. early remotely.  His last peripheral angiogram was September 2012 which showed a patent left fem-pop, 70% to 80% tandem SFA stenosis.  He has increased renal velocities on the right and left on past renal Dopplers.  Patient presents today for blood pressure check and with complaints of lower extremity edema.  The patient currently denies nausea, vomiting, fever, chest pain, shortness of breath, orthopnea, dizziness, PND, cough, congestion, abdominal pain, hematochezia, melena, claudication.  Wt Readings from Last 3 Encounters:  10/21/12 133 lb (60.328 kg)  07/23/12 138 lb (62.596 kg)  06/25/12 155 lb (70.308 kg)     Past Medical History  Diagnosis Date  . COPD (chronic obstructive pulmonary disease)   . Hypertension   . PVD (peripheral vascular disease) 04/27/2011    Normal Renal & abdominal Aortography; s/p Left Fem-Pop BPG 2004.  Marland Kitchen Dyslipidemia   . Coronary artery disease 04/27/2011    Low risk Myoview in 2010:Non-obstructive disase, except for small OMB 95% ostial lesion    Current Outpatient Prescriptions  Medication Sig Dispense Refill  . ALPRAZolam (XANAX) 0.5 MG tablet Take 1 tablet (0.5 mg total) by mouth at bedtime as needed for sleep.  30 tablet  0  . amLODipine (NORVASC) 5 MG tablet Take 1 tablet (5 mg total) by mouth daily.  30 tablet  5  . aspirin 81 MG tablet Take 81 mg by mouth daily.      . Choline Fenofibrate (FENOFIBRIC ACID) 135 MG CPDR Take 1 capsule by mouth daily.  30 capsule  11  . co-enzyme Q-10 30 MG capsule Take 30 mg by mouth daily.      . enalapril (VASOTEC) 20 MG tablet Take 1 tablet (20  mg total) by mouth 2 (two) times daily.  60 tablet  6  . FLUoxetine (PROZAC) 10 MG tablet Take 1 tablet (10 mg total) by mouth daily.  30 tablet  0  . folic acid (FOLVITE) 1 MG tablet Take 1 tablet (1 mg total) by mouth daily.  30 tablet  11  . hydrALAZINE (APRESOLINE) 50 MG tablet Take 1.5 tablets (75 mg total) by mouth 3 (three) times daily.  150 tablet  5  . metoprolol succinate (TOPROL-XL) 25 MG 24 hr tablet Take 25 mg by mouth daily.      . pantoprazole (PROTONIX) 40 MG tablet Take 1 tablet (40 mg total) by mouth daily.  30 tablet  3  . rosuvastatin (CRESTOR) 20 MG tablet Take 1 tablet (20 mg total) by mouth daily.  30 tablet  11  . vitamin B-12 (CYANOCOBALAMIN) 100 MCG tablet Take 100 mcg by mouth daily.      Marland Kitchen albuterol (PROVENTIL HFA;VENTOLIN HFA) 108 (90 BASE) MCG/ACT inhaler Inhale 2 puffs into the lungs every 4 (four) hours as needed for wheezing.  1 Inhaler  12   No current facility-administered medications for this visit.    Allergies:    Allergies  Allergen Reactions  . Contrast Media [Iodinated Diagnostic Agents] Itching  . Neosporin [Neomycin-Bacitracin Zn-Polymyx] Hives  . Neomycin Hives    Social History:  The patient  reports  that he has quit smoking. His smoking use included Cigarettes. He has a 50 pack-year smoking history. He quit smokeless tobacco use about 14 years ago. He reports that he drinks about 1.2 ounces of alcohol per week. He reports that he does not use illicit drugs.   Family history:   Family History  Problem Relation Age of Onset  . CAD Brother   . Hypertension Father   . Hypertension Brother      X 2  . Cancer - Other Mother     Kidney    ROS:  Please see the history of present illness.  All other systems reviewed and negative.   PHYSICAL EXAM: VS:  BP 170/80  Pulse 60  Ht 5\' 7"  (1.702 m)  Wt 133 lb (60.328 kg)  BMI 20.83 kg/m2 Well nourished, well developed, in no acute distress HEENT: Pupils are equal round react to light  accommodation extraocular movements are intact.  Neck: no JVDNo cervical lymphadenopathy. Cardiac: Regular rate and rhythm with  2/6 systolic mm. Lungs:  clear to auscultation bilaterally, no wheezing, rhonchi or rales Ext: trace lower extremity edema.  2+ radial and  Certainly a pulses. Skin: warm and dry Neuro:  Grossly normal  ASSESSMENT AND PLAN:  Problem List Items Addressed This Visit   HTN  - Primary (Chronic)      The patient's blood pressure still uncontrolled.    I am going to increase his hydralazine to 75 mg, 3 times daily    Relevant Medications      amLODIpine (NORVASC) tablet      hydrALAZINE (APRESOLINE) tablet

## 2012-10-21 NOTE — Assessment & Plan Note (Signed)
The patient's blood pressure still uncontrolled.    I am going to increase his hydralazine to 75 mg, 3 times daily

## 2012-10-21 NOTE — Patient Instructions (Signed)
Increase hydralazine to 75 mg three times per day.  Continue amlodipine at 5mg  daily.

## 2012-10-31 DIAGNOSIS — R933 Abnormal findings on diagnostic imaging of other parts of digestive tract: Secondary | ICD-10-CM | POA: Diagnosis not present

## 2012-10-31 DIAGNOSIS — R634 Abnormal weight loss: Secondary | ICD-10-CM | POA: Diagnosis not present

## 2012-11-09 DIAGNOSIS — Z23 Encounter for immunization: Secondary | ICD-10-CM | POA: Diagnosis not present

## 2012-11-27 ENCOUNTER — Ambulatory Visit: Payer: Medicare Other | Admitting: Physician Assistant

## 2012-11-28 ENCOUNTER — Other Ambulatory Visit: Payer: Self-pay | Admitting: Internal Medicine

## 2012-12-13 ENCOUNTER — Ambulatory Visit: Payer: Medicare Other | Admitting: Physician Assistant

## 2012-12-28 ENCOUNTER — Other Ambulatory Visit: Payer: Self-pay | Admitting: Internal Medicine

## 2013-01-11 ENCOUNTER — Other Ambulatory Visit: Payer: Self-pay | Admitting: Cardiovascular Disease

## 2013-01-11 ENCOUNTER — Other Ambulatory Visit: Payer: Self-pay | Admitting: Internal Medicine

## 2013-01-13 ENCOUNTER — Ambulatory Visit (INDEPENDENT_AMBULATORY_CARE_PROVIDER_SITE_OTHER): Payer: Medicare Other | Admitting: Physician Assistant

## 2013-01-13 ENCOUNTER — Encounter: Payer: Self-pay | Admitting: Physician Assistant

## 2013-01-13 ENCOUNTER — Other Ambulatory Visit: Payer: Self-pay | Admitting: Cardiovascular Disease

## 2013-01-13 ENCOUNTER — Telehealth: Payer: Self-pay | Admitting: *Deleted

## 2013-01-13 VITALS — BP 180/70 | HR 65 | Ht 68.0 in | Wt 136.0 lb

## 2013-01-13 DIAGNOSIS — R609 Edema, unspecified: Secondary | ICD-10-CM

## 2013-01-13 DIAGNOSIS — R600 Localized edema: Secondary | ICD-10-CM | POA: Insufficient documentation

## 2013-01-13 DIAGNOSIS — I10 Essential (primary) hypertension: Secondary | ICD-10-CM | POA: Diagnosis not present

## 2013-01-13 NOTE — Assessment & Plan Note (Signed)
Left great than right.  I have asked him to ear his compression socks during the day for the next three days and see if that resolves the edema.  If so, he should continue to wear them particularly when he is up walking.  He did venous dopplers last Feb. Which were negative for DVT.  I think this is more venous insufficiency.  He does take norvasc, but at this point with the elevated BP I am reluctant to stop it.

## 2013-01-13 NOTE — Telephone Encounter (Signed)
Done

## 2013-01-13 NOTE — Telephone Encounter (Signed)
Rx was sent to pharmacy electronically. 

## 2013-01-13 NOTE — Assessment & Plan Note (Addendum)
BP is elevated again.  Apparently two weeks after I increased his hydralazine last time his BP was excellent.  He has only been taking the enalapril once daily instead of twice.  He increase that today.

## 2013-01-13 NOTE — Progress Notes (Signed)
Date:  01/13/2013   ID:  BRITTANY AMIRAULT, DOB 10/14/1935, MRN [id number]  PCP:  Nicki Reaper, NP  Primary Cardiologist:  Allyson Sabal     History of Present Illness: Gary [name] is a 77 y.o. male with a history of tobacco abuse but quit smoking almost 16 years ago.  His history also includes hypertension, EtOH abuse, COPD, PAD, dyslipidemia, status post LFPPD by Dr. early remotely. His last peripheral angiogram was September 2012 which showed a patent left fem-pop, 70% to 80% tandem SFA stenosis. He has increased renal velocities on the right and left on past renal Dopplers.  He had a renal angiogram on 06/25/12 which revealed widely patent renal arteries.   2D echo in July 2014 revealed an EF of 55-60%, grade one diastolic dysfunction and peak PA pressures of .  The patient presents today with complaints of LEE in the ankles.  This has been an issue for about 1.5 months.  When I last saw him in September he had a trace of LEE.  The left side is worse than the right.  He describes some right heel pain as well.  He was diagnosed with plantarfascitis at one point.  He denies orthopnea, PND and his SOB is at baseline.  The patient currently denies nausea, vomiting, fever, chest pain, dizziness, cough, congestion, abdominal pain, hematochezia, melena, claudication.  Wt Readings from Last 3 Encounters:  01/13/13 136 lb (61.689 kg)  10/21/12 133 lb (60.328 kg)  07/23/12 138 lb (62.596 kg)     Past Medical History  Diagnosis Date  . COPD (chronic obstructive pulmonary disease)   . Hypertension   . PVD (peripheral vascular disease) 04/27/2011    Normal Renal & abdominal Aortography; s/p Left Fem-Pop BPG 2004.  Marland Kitchen Dyslipidemia   . Coronary artery disease 04/27/2011    Low risk Myoview in 2010:Non-obstructive disase, except for small OMB 95% ostial lesion    Current Outpatient Prescriptions  Medication Sig Dispense Refill  . ALPRAZolam (XANAX) 0.5 MG tablet Take 1 tablet (0.5 mg  total) by mouth at bedtime as needed for sleep.  30 tablet  0  . amLODipine (NORVASC) 5 MG tablet Take 1 tablet (5 mg total) by mouth daily.  30 tablet  5  . aspirin 81 MG tablet Take 81 mg by mouth daily.      . Choline Fenofibrate (FENOFIBRIC ACID) 135 MG CPDR Take 1 capsule by mouth daily.  30 capsule  11  . co-enzyme Q-10 30 MG capsule Take 30 mg by mouth daily.      . enalapril (VASOTEC) 20 MG tablet Take 1 tablet (20 mg total) by mouth 2 (two) times daily.  60 tablet  6  . FLUoxetine (PROZAC) 10 MG tablet Take 1 tablet (10 mg total) by mouth daily.  30 tablet  0  . folic acid (FOLVITE) 1 MG tablet Take 1 tablet (1 mg total) by mouth daily.  30 tablet  11  . hydrALAZINE (APRESOLINE) 50 MG tablet Take 1.5 tablets (75 mg total) by mouth 3 (three) times daily.  150 tablet  5  . metoprolol succinate (TOPROL-XL) 25 MG 24 hr tablet Take 25 mg by mouth daily.      . pantoprazole (PROTONIX) 40 MG tablet TAKE 1 TABLET (40 MG TOTAL) BY MOUTH DAILY.  30 tablet  3  . rosuvastatin (CRESTOR) 20 MG tablet Take 1 tablet (20 mg total) by mouth daily.  30 tablet  11  . vitamin B-12 (CYANOCOBALAMIN) 100 MCG  tablet Take 100 mcg by mouth daily.      Marland Kitchen albuterol (PROVENTIL HFA;VENTOLIN HFA) 108 (90 BASE) MCG/ACT inhaler Inhale 2 puffs into the lungs every 4 (four) hours as needed for wheezing.  1 Inhaler  12   No current facility-administered medications for this visit.    Allergies:    Allergies  Allergen Reactions  . Contrast Media [Iodinated Diagnostic Agents] Itching  . Neosporin [Neomycin-Bacitracin Zn-Polymyx] Hives  . Neomycin Hives    Social History:  The patient  reports that he has quit smoking. His smoking use included Cigarettes. He has a 50 pack-year smoking history. He quit smokeless tobacco use about 14 years ago. He reports that he drinks about 1.2 ounces of alcohol per week. He reports that he does not use illicit drugs.   Family history:   Family History  Problem Relation Age of Onset    . CAD Brother   . Hypertension Father   . Hypertension Brother      X 2  . Cancer - Other Mother     Kidney    ROS:  Please see the history of present illness.  All other systems reviewed and negative.   PHYSICAL EXAM: VS:  BP 180/70  Pulse 65  Ht 5\' 8"  (1.727 m)  Wt 136 lb (61.689 kg)  BMI 20.68 kg/m2 Thin, well developed, in no acute distress HEENT: Pupils are equal round react to light accommodation extraocular movements are intact.  Neck: no JVD or hepatojugular reflux.No cervical lymphadenopathy. Cardiac: Regular rate and rhythm with 1/6 murmur.  No rubs or gallops. Lungs:  clear to auscultation bilaterally, no wheezing, rhonchi or rales Abd: soft, nontender, positive bowel sounds all quadrants, no hepatosplenomegaly Ext: 1+ L>R lower extremity edema in the ankles.  2+ radial and 1+ right PT 1+ Left dorsalis pedis pulses. Skin: warm and dry Neuro:  Grossly normal    ASSESSMENT AND PLAN:  Problem List Items Addressed This Visit   HTN  - Primary (Chronic)     BP is elevated again.  Apparently two weeks after I increased his hydralazine last time his BP was excellent.  He has only been taking the enalapril once daily instead of twice.  He increase that today.    Relevant Orders      EKG 12-Lead   Bilateral lower extremity edema     Left great than right.  I have asked him to ear his compression socks during the day for the next three days and see if that resolves the edema.  If so, he should continue to wear them particularly when he is up walking.  He did venous dopplers last Feb. Which were negative for DVT.  I think this is more venous insufficiency.  He does take norvasc, but at this point with the elevated BP I am reluctant to stop it.

## 2013-01-13 NOTE — Patient Instructions (Signed)
1.  Start taking enalapril twice daily. 2.  Start wearing compression socks during the day. 3.  Follow up in two weeks for BP check.

## 2013-01-14 ENCOUNTER — Encounter: Payer: Self-pay | Admitting: Physician Assistant

## 2013-01-15 ENCOUNTER — Other Ambulatory Visit (HOSPITAL_COMMUNITY): Payer: Self-pay | Admitting: Cardiovascular Disease

## 2013-01-15 DIAGNOSIS — I701 Atherosclerosis of renal artery: Secondary | ICD-10-CM

## 2013-01-27 ENCOUNTER — Encounter: Payer: Medicare Other | Admitting: Pharmacist Clinician (PhC)/ Clinical Pharmacy Specialist

## 2013-02-03 ENCOUNTER — Encounter: Payer: Self-pay | Admitting: Podiatry

## 2013-02-03 ENCOUNTER — Ambulatory Visit (INDEPENDENT_AMBULATORY_CARE_PROVIDER_SITE_OTHER): Payer: Medicare Other

## 2013-02-03 ENCOUNTER — Ambulatory Visit (INDEPENDENT_AMBULATORY_CARE_PROVIDER_SITE_OTHER): Payer: Medicare Other | Admitting: Podiatry

## 2013-02-03 VITALS — BP 168/70 | HR 64 | Resp 12 | Ht 68.0 in | Wt 131.0 lb

## 2013-02-03 DIAGNOSIS — M722 Plantar fascial fibromatosis: Secondary | ICD-10-CM

## 2013-02-03 DIAGNOSIS — R52 Pain, unspecified: Secondary | ICD-10-CM | POA: Diagnosis not present

## 2013-02-03 NOTE — Progress Notes (Signed)
   Subjective:    Patient ID: Gary [name], male    DOB: 02/15/1935, 77 y.o.   MRN: [id number]  Foot Pain      Review of Systems  Constitutional: Positive for appetite change and unexpected weight change.  HENT: Positive for hearing loss and sinus pressure.   Cardiovascular: Positive for leg swelling.       Objective:   Physical Exam        Assessment & Plan:

## 2013-02-03 NOTE — Progress Notes (Signed)
Subjective:     Patient ID: Gary [name], male   DOB: Mar 17, 1935, 77 y.o.   MRN: [id number]  Foot Pain   patient states that he is getting swelling in his ankles and pain in the bottom of his feet that he wanted to get looked at and to see if there is any ideas of what can be done. He said surgery on the right ankle and he has had shockwave therapy on the heels which has not been successful currently   Review of Systems  All other systems reviewed and are negative.       Objective:   Physical Exam  Constitutional: He is oriented to person, place, and time.  Musculoskeletal: Normal range of motion.  Neurological: He is oriented to person, place, and time.  Skin: Skin is dry.   significant diminishment of neurological and vascular sensation both feet with edema in the ankle region right over left and pain in the plantar heels of both feet especially at night very thin muscled gentleman with multiple signs of circulatory issues     Assessment:     Plantar fasciitis most likely related to circulatory issues and mild edema in the ankles with no known cause    Plan:     H&P and x-rays reviewed with patient and advised him on soaks and I did discussed injections which she denies wanting at the current time area he is tried insoles which have not been effective and I do want him to followup with cardiologist concerning circulatory status but they states they have been this numerous times. Unfortunately I don't have anything else to offer

## 2013-02-03 NOTE — Progress Notes (Signed)
N-SORE, SWELLING L-B/L FOOT D- 1 YEARS O-SLOWLY C-WORSE A-PRESSURE T-N/A

## 2013-02-04 ENCOUNTER — Telehealth (HOSPITAL_COMMUNITY): Payer: Self-pay | Admitting: *Deleted

## 2013-02-10 ENCOUNTER — Encounter (HOSPITAL_COMMUNITY): Payer: Medicare Other

## 2013-02-11 ENCOUNTER — Telehealth (HOSPITAL_COMMUNITY): Payer: Self-pay | Admitting: *Deleted

## 2013-02-24 ENCOUNTER — Other Ambulatory Visit (HOSPITAL_COMMUNITY): Payer: Self-pay | Admitting: Physician Assistant

## 2013-02-24 ENCOUNTER — Other Ambulatory Visit (HOSPITAL_COMMUNITY): Payer: Self-pay | Admitting: Cardiovascular Disease

## 2013-02-24 ENCOUNTER — Telehealth: Payer: Self-pay | Admitting: Physician Assistant

## 2013-02-24 DIAGNOSIS — I701 Atherosclerosis of renal artery: Secondary | ICD-10-CM

## 2013-02-24 DIAGNOSIS — I739 Peripheral vascular disease, unspecified: Secondary | ICD-10-CM

## 2013-02-24 MED ORDER — ATORVASTATIN CALCIUM 10 MG PO TABS: 10.0000 mg | ORAL_TABLET | Freq: Every day | ORAL | Status: DC

## 2013-02-24 NOTE — Telephone Encounter (Signed)
Returned call and pt verified x 2 w/ [name], pt' daughter.  Stated pt is currently on Crestor and Medicare will no longer carry it.  Stated it will cost >$200 and pt cannot afford it.  Of note, pt was supposed to return for BP check, which he did have done at [facility] and it was 168/70.  Wilburt Finlay, PA-C notified and advised pt come in for BP check and that Crestor be changed to to atorvastatin (Lipitor) 10 mg daily.  [name] informed and agreed w/ plan.  Appt scheduled for 1.27.15 at 8:40am w/ Wilburt Finlay, PA-C for BP check and Rx sent to pharmacy.

## 2013-02-24 NOTE — Telephone Encounter (Signed)
Rx was sent to pharmacy electronically. 

## 2013-02-24 NOTE — Telephone Encounter (Signed)
Needs to discuss his Crestor.  Medicare will no longer pay for.  Can she talk to someone about getting an alternative.  Please call.

## 2013-03-03 ENCOUNTER — Encounter: Payer: Self-pay | Admitting: *Deleted

## 2013-03-04 ENCOUNTER — Encounter: Payer: Self-pay | Admitting: Physician Assistant

## 2013-03-04 ENCOUNTER — Ambulatory Visit (INDEPENDENT_AMBULATORY_CARE_PROVIDER_SITE_OTHER): Payer: Medicare Other | Admitting: Physician Assistant

## 2013-03-04 ENCOUNTER — Ambulatory Visit (HOSPITAL_COMMUNITY)
Admission: RE | Admit: 2013-03-04 | Discharge: 2013-03-04 | Disposition: A | Discharge status: Home / Self Care | Payer: Medicare Other | Source: Ambulatory Visit | Attending: Cardiology | Admitting: Cardiology

## 2013-03-04 VITALS — BP 200/70 | HR 72 | Ht 67.0 in | Wt 130.0 lb

## 2013-03-04 DIAGNOSIS — R600 Localized edema: Secondary | ICD-10-CM

## 2013-03-04 DIAGNOSIS — I10 Essential (primary) hypertension: Secondary | ICD-10-CM

## 2013-03-04 DIAGNOSIS — R609 Edema, unspecified: Secondary | ICD-10-CM | POA: Diagnosis not present

## 2013-03-04 DIAGNOSIS — I70209 Unspecified atherosclerosis of native arteries of extremities, unspecified extremity: Secondary | ICD-10-CM | POA: Diagnosis not present

## 2013-03-04 DIAGNOSIS — Z87891 Personal history of nicotine dependence: Secondary | ICD-10-CM | POA: Insufficient documentation

## 2013-03-04 DIAGNOSIS — E785 Hyperlipidemia, unspecified: Secondary | ICD-10-CM | POA: Diagnosis not present

## 2013-03-04 DIAGNOSIS — I739 Peripheral vascular disease, unspecified: Secondary | ICD-10-CM

## 2013-03-04 MED ORDER — HYDROCHLOROTHIAZIDE 12.5 MG PO CAPS: 12.5000 mg | ORAL_CAPSULE | Freq: Every day | ORAL | Status: DC

## 2013-03-04 NOTE — Assessment & Plan Note (Signed)
Blood pressure severely elevated today. He was given one sublingual nitroglycerin and recheck a blood pressure for about 10 minutes it was 152/60. Patient reports he is taking his medications as directed. He is scheduled for renal artery ultrasound.  I'm also going to add HCTZ 12.5 mg daily this will help the swelling in his legs as well.

## 2013-03-04 NOTE — Assessment & Plan Note (Signed)
Meds HCTZ for blood pressure hopefully this will assist in decreased edema.  He will likely stop wearing the compression socks because it pushes the edema to his knees.

## 2013-03-04 NOTE — Progress Notes (Signed)
Lower Extremity Arterial Duplex Completed. °Brianna L Mazza,RVT °

## 2013-03-04 NOTE — Assessment & Plan Note (Signed)
He was recently switched to atorvastatin so it would be less expensive

## 2013-03-04 NOTE — Progress Notes (Signed)
Date:  03/04/2013   ID:  Gary [name], DOB 10-08-1935, MRN [id number]  PCP:  Nicki ReaperBAITY, REGINA, NP  Primary Cardiologist: Allyson SabalBerry    History of Present Illness: Gary [name] is a 78 y.o. male with a history of tobacco abuse, but quit smoking almost 16 years ago, hypertension, EtOH abuse, COPD, PAD, dyslipidemia, status post LFPPD by Dr. early remotely. His last peripheral angiogram was September 2012 which showed a patent left fem-pop, 70% to 80% tandem SFA stenosis. He has increased renal velocities on the right and left on past renal Dopplers. He had a renal angiogram on 06/25/12 which revealed widely patent renal arteries. 2D echo in July 2014 revealed an EF of 55-60%, grade one diastolic dysfunction and peak PA pressures of 35mmHg.  Patient underwent lower extreme venous Dopplers approximately one year ago which were negative for DVT  The patient presents today for a BP check.  He also underwent lower extremity arterial duplex today.  He complains of  continued lower extreme edema which is worse on the left. When he wears his compression socks, which I asked him to do, it pushes the swelling up into his knee which is very uncomfortable.  The most activity the patient gets his weight is up to get one for his stove.    The patient currently denies nausea, vomiting, fever, chest pain, shortness of breath, orthopnea, dizziness, PND, cough, congestion, abdominal pain, hematochezia, melena.  Wt Readings from Last 3 Encounters:  03/04/13 130 lb (58.968 kg)  02/03/13 131 lb (59.421 kg)  01/13/13 136 lb (61.689 kg)     Past Medical History  Diagnosis Date  . 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 Hypertension   . PVD (peripheral vascular disease) 04/27/2011    normal renal & abdominal aortography; s/p Left Fem-Pop BPG 2004.  Marland Kitchen. Dyslipidemia   . Coronary artery disease 04/27/2011    Low risk Myoview in 2010:Non-obstructive disase, except for  small OMB 95% ostial lesion  . H/O ETOH abuse   . GERD (gastroesophageal reflux disease)     Current Outpatient Prescriptions  Medication Sig Dispense Refill  . amLODipine (NORVASC) 5 MG tablet Take 1 tablet (5 mg total) by mouth daily.  30 tablet  5  . aspirin 81 MG tablet Take 81 mg by mouth daily.      Marland Kitchen. atorvastatin (LIPITOR) 10 MG tablet Take 1 tablet (10 mg total) by mouth daily.  30 tablet  2  . Choline Fenofibrate (FENOFIBRIC ACID) 135 MG CPDR TAKE 1 CAPSULE DAILY.  30 capsule  11  . co-enzyme Q-10 30 MG capsule Take 30 mg by mouth daily.      . enalapril (VASOTEC) 20 MG tablet Take 1 tablet (20 mg total) by mouth 2 (two) times daily.  60 tablet  6  . folic acid (FOLVITE) 1 MG tablet TAKE 1 TABLET DAILY.  30 tablet  11  . hydrALAZINE (APRESOLINE) 50 MG tablet Take 1.5 tablets (75 mg total) by mouth 3 (three) times daily.  135 tablet  11  . metoprolol succinate (TOPROL-XL) 25 MG 24 hr tablet Take 25 mg by mouth daily.      . pantoprazole (PROTONIX) 40 MG tablet TAKE 1 TABLET (40 MG TOTAL) BY MOUTH DAILY.  30 tablet  3  . vitamin B-12 (CYANOCOBALAMIN) 100 MCG tablet Take 100 mcg by mouth daily.      Marland Kitchen. albuterol (PROVENTIL HFA;VENTOLIN HFA) 108 (90 BASE) MCG/ACT  inhaler Inhale 2 puffs into the lungs every 4 (four) hours as needed for wheezing.  1 Inhaler  12  . hydrochlorothiazide (MICROZIDE) 12.5 MG capsule Take 1 capsule (12.5 mg total) by mouth daily.  90 capsule  3   No current facility-administered medications for this visit.    Allergies:    Allergies  Allergen Reactions  . Contrast Media [Iodinated Diagnostic Agents] Itching  . Diovan [Valsartan] Swelling    angioedema  . Neosporin [Neomycin-Bacitracin Zn-Polymyx] Hives  . Neomycin Hives    Social History:  The patient  reports that he quit smoking about 16 years ago. His smoking use included Cigarettes. He has a 50 pack-year smoking history. He quit smokeless tobacco use about 14 years ago. He reports that he drinks  about 1.2 ounces of alcohol per week. He reports that he does not use illicit drugs.   Family history:   Family History  Problem Relation Age of Onset  . CAD Brother     also stroke  . Hypertension Father     also stroke  . Hypertension Brother      X 2  . Kidney cancer Mother     ROS:  Please see the history of present illness.  All other systems reviewed and negative.   PHYSICAL EXAM: VS:  BP 200/70  Pulse 72  Ht 5\' 7"  (1.702 m)  Wt 130 lb (58.968 kg)  BMI 20.36 kg/m2         BP recheck 152/60 Well nourished, well developed, in no acute distress HEENT: Pupils are equal round react to light accommodation extraocular movements are intact.  Neck: no JVDNo cervical lymphadenopathy. Cardiac: Regular rate and rhythm with 2/6 sys murmur. Lungs:  clear to auscultation bilaterally, no wheezing, rhonchi or rales Abd: soft, nontender, positive bowel sounds all quadrants, no hepatosplenomegaly Ext: 2+ left LEE, 1+ right LEE.  2+ radial and 1+ dorsalis pedis pulses. Skin: warm and dry Neuro:  Grossly normal      ASSESSMENT AND PLAN:  Problem List Items Addressed This Visit   HTN  - Primary (Chronic)     Blood pressure severely elevated today. He was given one sublingual nitroglycerin and recheck a blood pressure for about 10 minutes it was 152/60. Patient reports he is taking his medications as directed. He is scheduled for renal artery ultrasound.  I'm also going to add HCTZ 12.5 mg daily this will help the swelling in his legs as well.    Relevant Medications      Hydrochlorothiazide 12.5 mg po tabs   HLD (hyperlipidemia)     He was recently switched to atorvastatin so it would be less expensive    Bilateral lower extremity edema     Meds HCTZ for blood pressure hopefully this will assist in decreased edema.  He will likely stop wearing the compression socks because it pushes the edema to his knees.

## 2013-03-04 NOTE — Patient Instructions (Addendum)
1.  start taking HCTZ once daily. 2.  check her blood pressure daily for the next week and write it down. 3.  we'll do a blood pressure check in 2 weeks.

## 2013-03-18 ENCOUNTER — Encounter: Payer: Self-pay | Admitting: Pharmacist Clinician (PhC)/ Clinical Pharmacy Specialist

## 2013-03-18 ENCOUNTER — Ambulatory Visit (HOSPITAL_COMMUNITY)
Admission: RE | Admit: 2013-03-18 | Discharge: 2013-03-18 | Disposition: A | Discharge status: Home / Self Care | Payer: Medicare Other | Source: Ambulatory Visit | Attending: Cardiovascular Disease | Admitting: Cardiovascular Disease

## 2013-03-18 ENCOUNTER — Ambulatory Visit (INDEPENDENT_AMBULATORY_CARE_PROVIDER_SITE_OTHER): Payer: Medicare Other | Admitting: Pharmacist Clinician (PhC)/ Clinical Pharmacy Specialist

## 2013-03-18 VITALS — BP 180/66 | HR 64 | Ht 67.0 in | Wt 126.0 lb

## 2013-03-18 DIAGNOSIS — E785 Hyperlipidemia, unspecified: Secondary | ICD-10-CM | POA: Diagnosis not present

## 2013-03-18 DIAGNOSIS — I10 Essential (primary) hypertension: Secondary | ICD-10-CM

## 2013-03-18 DIAGNOSIS — Z87891 Personal history of nicotine dependence: Secondary | ICD-10-CM | POA: Diagnosis not present

## 2013-03-18 DIAGNOSIS — I701 Atherosclerosis of renal artery: Secondary | ICD-10-CM | POA: Diagnosis not present

## 2013-03-18 MED ORDER — CHLORTHALIDONE 25 MG PO TABS: 25.0000 mg | ORAL_TABLET | Freq: Every day | ORAL | Status: DC

## 2013-03-18 NOTE — Patient Instructions (Signed)
Your blood pressure today is high at 180/66  Check your blood pressure at home daily (if able) and keep record of the readings.  Take your BP meds as follows:  AM: chlorthalidone 25mg          Enalapril 20mg          Hydralazine 75mg     MidDay:  Hydralazine 75mg     PM: enalapril 20mg         Hydralazine 75mg         Amlodipine 5mg    Bring all of your meds, your BP cuff and your record of home blood pressures to your next appointment.  Exercise as you're able, try to walk approximately 30 minutes per day.  Keep salt intake to a minimum, especially watch canned and prepared boxed foods.  Eat more fresh fruits and vegetables and fewer canned items.  Avoid eating in fast food restaurants.    HOW TO TAKE YOUR BLOOD PRESSURE:   Rest 5 minutes before taking your blood pressure.    Don't smoke or drink caffeinated beverages for at least 30 minutes before.   Take your blood pressure before (not after) you eat.   Sit comfortably with your back supported and both feet on the floor (don't cross your legs).   Elevate your arm to heart level on a table or a desk.   Use the proper sized cuff. It should fit smoothly and snugly around your bare upper arm. There should be enough room to slip a fingertip under the cuff. The bottom edge of the cuff should be 1 inch above the crease of the elbow.   Ideally, take 3 measurements at one sitting and record the average.

## 2013-03-18 NOTE — Progress Notes (Signed)
Renal Duplex Completed. Ashlee Player, BS, RDMS, RVT  

## 2013-03-18 NOTE — Progress Notes (Signed)
03/18/2013 Gary [name] 1935/09/24 [id number]   HPI:  Gary [name] is a 78 y.o. male patient of Dr Allyson SabalBerry, with a PMH below who presents today for a blood pressure check.  He was seen by Wilburt FinlayBryan Hager PA on 1/27 and found to have a BP of 200/70.  He was then given a sublingual nitroglycerine tablet and after 10 minutes his pressure was improved at 152/60.  He is here today with his daughter.  Apparently she sets his medications out for him and states that usually 1-3 times per week he will miss medications.  She puts them into weekly pill holders, divided into AM, noon and PM.  He doesn't usually miss a whole day, just one dosing time.  He gave up tobacco products 15+ years ago and does drink beer on a daily basis.  He routinely walks about 1-2 blocks from his home to [employer] for breakfast, consisting of a biscuit and coffee, meeting up with his "gray-haired group".  Otherwise he tends to cook his own meals at home, using NoSalt rather than sodium.   And other than his walks to [employer] he gets no regular exercise.  Gary [name] also has a problem with lower extremity edema but does not wear compression stocking because then the swelling goes to his knees.  He has a home blood pressure cuff, although he apparently has trouble using it correctly.  His daughter states that they have a lady come to clean his house and she (a Theatre stage managernursing student) has compared his blood pressure cuff to her sphygmomanometer and found it to be accurate, so I assume that he has trouble getting the cuff placed correctly.  His daughter checked his pressure Saturday and found it to be 162/82.  He had an allergic reaction to valsartan in the past (angioedema), so we cannot use ARBs in his therapy.   Current Outpatient Prescriptions  Medication Sig Dispense Refill  . albuterol (PROVENTIL HFA;VENTOLIN HFA) 108 (90 BASE) MCG/ACT inhaler Inhale 2 puffs into the lungs every 4 (four) hours as needed for wheezing.  1 Inhaler  12    . amLODipine (NORVASC) 5 MG tablet Take 1 tablet (5 mg total) by mouth daily.  30 tablet  5  . aspirin 81 MG tablet Take 81 mg by mouth daily.      Marland Kitchen. atorvastatin (LIPITOR) 10 MG tablet Take 1 tablet (10 mg total) by mouth daily.  30 tablet  2  . chlorthalidone (HYGROTON) 25 MG tablet Take 1 tablet (25 mg total) by mouth daily.  30 tablet  5  . Choline Fenofibrate (FENOFIBRIC ACID) 135 MG CPDR TAKE 1 CAPSULE DAILY.  30 capsule  11  . co-enzyme Q-10 30 MG capsule Take 30 mg by mouth daily.      . enalapril (VASOTEC) 20 MG tablet Take 1 tablet (20 mg total) by mouth 2 (two) times daily.  60 tablet  6  . folic acid (FOLVITE) 1 MG tablet TAKE 1 TABLET DAILY.  30 tablet  11  . hydrALAZINE (APRESOLINE) 50 MG tablet Take 1.5 tablets (75 mg total) by mouth 3 (three) times daily.  135 tablet  11  . hydrochlorothiazide (MICROZIDE) 12.5 MG capsule Take 1 capsule (12.5 mg total) by mouth daily.  90 capsule  3  . metoprolol succinate (TOPROL-XL) 25 MG 24 hr tablet Take 25 mg by mouth daily.      . pantoprazole (PROTONIX) 40 MG tablet TAKE 1 TABLET (40 MG TOTAL) BY MOUTH DAILY.  30 tablet  3  . vitamin B-12 (CYANOCOBALAMIN) 100 MCG tablet Take 100 mcg by mouth daily.       No current facility-administered medications for this visit.    Allergies  Allergen Reactions  . Contrast Media [Iodinated Diagnostic Agents] Itching  . Diovan [Valsartan] Swelling    angioedema  . Neosporin [Neomycin-Bacitracin Zn-Polymyx] Hives  . Neomycin Hives    Past Medical History  Diagnosis Date  . 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 Hypertension   . PVD (peripheral vascular disease) 04/27/2011    normal renal & abdominal aortography; s/p Left Fem-Pop BPG 2004.  Marland Kitchen Dyslipidemia   . Coronary artery disease 04/27/2011    Low risk Myoview in 2010:Non-obstructive disase, except for small OMB 95% ostial lesion  . H/O ETOH abuse   . GERD (gastroesophageal reflux disease)      Blood pressure 180/66, pulse 64, height 5\' 7"  (1.702 m), weight 126 lb (57.153 kg). Standing 150/66   ASSESSMENT AND PLAN:  Gary [name]'s blood pressure today is still elevated above JNC guidelines.   He is having a repeat renal doppler test today, the last one (05/2012) showed both renal arteries to be 60-99% blocked.  According to that report there was no significant change from the previous doppler done in 09/2011.  We don't have a lot of options for his blood pressure.  He cannot take ARBs due to an angioedema event, and I don't want to increase the amlodipine because of his already edematous lower extremities.  Today I will switch his HCTZ 12.5mg  to chlorthalidone 25mg  and see if we can't get some improvement in pressure as well as decrease in swelling.   If his renal doppler test remains unchanged, our next best option would be to increase the hydralazine to 100mg  tid.  I'm sure his breakfasts at [employer] are loaded with sodium, however as his weight is down 4 pounds from January 27, so I'm not compelled to ask him to stop. He will go to the lab after 1 week for a BMET, and depending on the results of the doppler will be seen by Dr. Allyson Sabal or myself in another month.   Phillips Hay PharmD CPP [facility]

## 2013-03-31 DIAGNOSIS — I10 Essential (primary) hypertension: Secondary | ICD-10-CM | POA: Diagnosis not present

## 2013-03-31 LAB — BASIC METABOLIC PANEL
BUN: 19 mg/dL (ref 6–23)
CHLORIDE: 99 meq/L (ref 96–112)
CO2: 28 meq/L (ref 19–32)
Calcium: 8.4 mg/dL (ref 8.4–10.5)
Creat: 0.74 mg/dL (ref 0.50–1.35)
GLUCOSE: 90 mg/dL (ref 70–99)
POTASSIUM: 3 meq/L — AB (ref 3.5–5.3)
SODIUM: 137 meq/L (ref 135–145)

## 2013-04-04 ENCOUNTER — Telehealth: Payer: Self-pay | Admitting: *Deleted

## 2013-04-04 DIAGNOSIS — Z79899 Other long term (current) drug therapy: Secondary | ICD-10-CM

## 2013-04-04 MED ORDER — POTASSIUM CHLORIDE CRYS ER 20 MEQ PO TBCR: 40.0000 meq | EXTENDED_RELEASE_TABLET | Freq: Every day | ORAL | Status: DC

## 2013-04-04 NOTE — Telephone Encounter (Signed)
Order placed for medication and repeat labs.  Patient notified of medication and need for repeat labs.

## 2013-04-04 NOTE — Telephone Encounter (Signed)
Message copied by Marella Bile on Fri Apr 04, 2013  2:36 PM ------      Message from: Abelino Derrick      Created: Fri Apr 04, 2013 11:53 AM       KDUR 40 meq daily, re check BMP in 1 one wk.            Corine Shelter PA-C      04/04/2013      11:53 AM       ------

## 2013-04-06 ENCOUNTER — Telehealth: Payer: Self-pay | Admitting: *Deleted

## 2013-04-06 ENCOUNTER — Encounter: Payer: Self-pay | Admitting: *Deleted

## 2013-04-06 DIAGNOSIS — I701 Atherosclerosis of renal artery: Secondary | ICD-10-CM

## 2013-04-06 NOTE — Telephone Encounter (Signed)
Order placed for repeat renal dopplers in 1 year  

## 2013-04-06 NOTE — Telephone Encounter (Signed)
Message copied by Marella Bile on Sun Apr 06, 2013 10:19 PM ------      Message from: Runell Gess      Created: Sat Apr 05, 2013 11:13 AM       No change from prior study. Repeat in 12 months. ------

## 2013-04-23 DIAGNOSIS — Z79899 Other long term (current) drug therapy: Secondary | ICD-10-CM | POA: Diagnosis not present

## 2013-04-23 LAB — BASIC METABOLIC PANEL
BUN: 16 mg/dL (ref 6–23)
CO2: 28 meq/L (ref 19–32)
CREATININE: 0.78 mg/dL (ref 0.50–1.35)
Calcium: 9.4 mg/dL (ref 8.4–10.5)
Chloride: 96 mEq/L (ref 96–112)
Glucose, Bld: 98 mg/dL (ref 70–99)
Potassium: 3.7 mEq/L (ref 3.5–5.3)
SODIUM: 134 meq/L — AB (ref 135–145)

## 2013-04-28 ENCOUNTER — Encounter: Payer: Self-pay | Admitting: *Deleted

## 2013-04-29 ENCOUNTER — Ambulatory Visit (INDEPENDENT_AMBULATORY_CARE_PROVIDER_SITE_OTHER): Payer: Medicare Other | Admitting: Cardiovascular Disease

## 2013-04-29 ENCOUNTER — Encounter: Payer: Self-pay | Admitting: Cardiovascular Disease

## 2013-04-29 VITALS — BP 152/74 | HR 95 | Ht 67.0 in | Wt 116.7 lb

## 2013-04-29 DIAGNOSIS — I779 Disorder of arteries and arterioles, unspecified: Secondary | ICD-10-CM | POA: Diagnosis not present

## 2013-04-29 DIAGNOSIS — I213 ST elevation (STEMI) myocardial infarction of unspecified site: Secondary | ICD-10-CM

## 2013-04-29 DIAGNOSIS — R5381 Other malaise: Secondary | ICD-10-CM

## 2013-04-29 DIAGNOSIS — I219 Acute myocardial infarction, unspecified: Secondary | ICD-10-CM

## 2013-04-29 DIAGNOSIS — I701 Atherosclerosis of renal artery: Secondary | ICD-10-CM | POA: Diagnosis not present

## 2013-04-29 DIAGNOSIS — E785 Hyperlipidemia, unspecified: Secondary | ICD-10-CM | POA: Diagnosis not present

## 2013-04-29 DIAGNOSIS — R5383 Other fatigue: Secondary | ICD-10-CM

## 2013-04-29 DIAGNOSIS — I739 Peripheral vascular disease, unspecified: Secondary | ICD-10-CM | POA: Diagnosis not present

## 2013-04-29 DIAGNOSIS — I10 Essential (primary) hypertension: Secondary | ICD-10-CM

## 2013-04-29 DIAGNOSIS — Z79899 Other long term (current) drug therapy: Secondary | ICD-10-CM

## 2013-04-29 NOTE — Assessment & Plan Note (Signed)
Status post cardiac catheterization 04/27/11 in the setting of a nonsteady revealing a small obtuse marginal branch and a left dominant system that had a 95% ostial stenosis. He had normal LV function.

## 2013-04-29 NOTE — Assessment & Plan Note (Signed)
By duplex ultrasound 2 years ago with moderately severe right and moderate left ICA stenosis. He does have bilateral carotid bruits. We'll recheck carotid Doppler studies.

## 2013-04-29 NOTE — Assessment & Plan Note (Signed)
Controlled on current medications 

## 2013-04-29 NOTE — Progress Notes (Signed)
04/29/2013 Gary [name]   Jan 05, 1936  [id number]  Primary Physician Nicki ReaperBAITY, REGINA, NP Primary Cardiologist: Runell GessJonathan J. Gary Hinch MD Roseanne RenoFACP,FACC,FAHA, FSCAI   HPI:  Gary [name] is a 78 y.o. male with a history of tobacco abuse, but quit smoking almost 16 years ago, hypertension, EtOH abuse, COPD, PAD, dyslipidemia, status post LFPPD by Dr. early remotely. His last peripheral angiogram was September 2012 which showed a patent left fem-pop, 70% to 80% tandem SFA stenosis. He has increased renal velocities on the right and left on past renal Dopplers. He had a renal angiogram on 06/25/12 which revealed widely patent renal arteries. 2D echo in July 2014 revealed an EF of 55-60%, grade one diastolic dysfunction and peak PA pressures of 35mmHg. Patient underwent lower extreme venous Dopplers approximately one year ago which were negative for DVT  The patient presents today for a BP check. He also underwent lower extremity arterial duplex today. He complains of continued lower extreme edema which is worse on the left. When he wears his compression socks, which I asked him to do, it pushes the swelling up into his knee which is very uncomfortable. The most activity the patient gets his weight is up to get one for his stove.  He was seen by Huey BienenstockBrian Hager PA-C 03/04/13. He denies chest pain or shortness of breath. He has had 20-30 pounds of unexplained weight loss which has been worked up by his primary care physician. He complains of being weak and somewhat off balance and walks with the aid of a walker. Recent lower extremity Doppler studies performed in our office 1/27//15 revealed an occluded right SFA which is a new finding and high-grade disease in his left common femoral and popliteal artery with a patent femoropopliteal bypass graft. He does appear to have a proximal anastomotic lesion.     Current Outpatient Prescriptions  Medication Sig Dispense Refill  . albuterol (PROVENTIL HFA;VENTOLIN  HFA) 108 (90 BASE) MCG/ACT inhaler Inhale 2 puffs into the lungs every 4 (four) hours as needed for wheezing.  1 Inhaler  12  . amLODipine (NORVASC) 5 MG tablet Take 1 tablet (5 mg total) by mouth daily.  30 tablet  5  . aspirin 81 MG tablet Take 81 mg by mouth daily.      . Choline Fenofibrate (FENOFIBRIC ACID) 135 MG CPDR TAKE 1 CAPSULE DAILY.  30 capsule  11  . co-enzyme Q-10 30 MG capsule Take 30 mg by mouth daily.      . enalapril (VASOTEC) 20 MG tablet Take 1 tablet (20 mg total) by mouth 2 (two) times daily.  60 tablet  6  . folic acid (FOLVITE) 1 MG tablet TAKE 1 TABLET DAILY.  30 tablet  11  . hydrALAZINE (APRESOLINE) 50 MG tablet Take 1.5 tablets (75 mg total) by mouth 3 (three) times daily.  135 tablet  11  . metoprolol succinate (TOPROL-XL) 25 MG 24 hr tablet Take 25 mg by mouth daily.      . pantoprazole (PROTONIX) 40 MG tablet TAKE 1 TABLET (40 MG TOTAL) BY MOUTH DAILY.  30 tablet  3  . potassium chloride SA (K-DUR,KLOR-CON) 20 MEQ tablet Take 2 tablets (40 mEq total) by mouth daily.  60 tablet  6  . rosuvastatin (CRESTOR) 20 MG tablet Take 20 mg by mouth daily.      . vitamin B-12 (CYANOCOBALAMIN) 100 MCG tablet Take 100 mcg by mouth daily.      . chlorthalidone (HYGROTON) 25 MG tablet  Take 1 tablet (25 mg total) by mouth daily.  30 tablet  5  . hydrochlorothiazide (MICROZIDE) 12.5 MG capsule Take 1 capsule (12.5 mg total) by mouth daily.  90 capsule  3   No current facility-administered medications for this visit.    Allergies  Allergen Reactions  . Contrast Media [Iodinated Diagnostic Agents] Itching  . Diovan [Valsartan] Swelling    angioedema  . Neosporin [Neomycin-Bacitracin Zn-Polymyx] Hives  . Neomycin Hives    History   Social History  . Marital Status: Divorced    Spouse Name: N/A    Number of Children: 3  . Years of Education: N/A   Occupational History  . welder, pipe fitter    Social History Main Topics  . Smoking status: Former Smoker -- 1.00  packs/day for 50 years    Types: Cigarettes    Quit date: 03/03/1997  . Smokeless tobacco: Former Neurosurgeon    Quit date: 04/27/1998  . Alcohol Use: 1.2 oz/week    2 Cans of beer per week  . Drug Use: No  . Sexual Activity: No   Other Topics Concern  . Not on file   Social History Narrative  . No narrative on file     Review of Systems: General: negative for chills, fever, night sweats or weight changes.  Cardiovascular: negative for chest pain, dyspnea on exertion, edema, orthopnea, palpitations, paroxysmal nocturnal dyspnea or shortness of breath Dermatological: negative for rash Respiratory: negative for cough or wheezing Urologic: negative for hematuria Abdominal: negative for nausea, vomiting, diarrhea, bright red blood per rectum, melena, or hematemesis Neurologic: negative for visual changes, syncope, or dizziness All other systems reviewed and are otherwise negative except as noted above.    Blood pressure 152/74, pulse 95, height 5\' 7"  (1.702 m), weight 116 lb 11.2 oz (52.935 kg).  General appearance: alert and no distress Neck: no adenopathy, no JVD, supple, symmetrical, trachea midline, thyroid not enlarged, symmetric, no tenderness/mass/nodules and soft bilateral carotid bruits Lungs: clear to auscultation bilaterally Heart: regular rate and rhythm, S1, S2 normal, no murmur, click, rub or gallop Extremities: extremities normal, atraumatic, no cyanosis or edema and soft bifemoral bruits  EKG normal sinus rhythm at 95 with diffuse ST and T-wave changes consistent with left ventricular hypertrophy with repolarization changes  ASSESSMENT AND PLAN:   STEMI (ST elevation myocardial infarction) - non-obstructive CAD by Cath on admission Status post cardiac catheterization 04/27/11 in the setting of a nonsteady revealing a small obtuse marginal branch and a left dominant system that had a 95% ostial stenosis. He had normal LV function.  HLD (hyperlipidemia) On statin therapy  with lipid profile performed 07/23/12 revealing a total cholesterol 243, LDL 156 HDL of 64  PVD, LFBPG 2004, dopplers OK 8/12 History of remote left femoropopliteal bypass grafting. I performed angiography on him 10/13/10 revealing a patent left femoropopliteal bypass graft, three-vessel runoff bilaterally with 40-50% stenoses in the iliacs and left common femoral. He did have 70-80% segmental calcified right SFA stenosis which has subsequently occluded by his most recent duplex ultrasound 03/04/13.  He really denies Lescol and claudication in 1 the end of a walker. I believe his English is limited because of multiple reasons including his COPD and generalized weakness.  Carotid artery disease By duplex ultrasound 2 years ago with moderately severe right and moderate left ICA stenosis. He does have bilateral carotid bruits. We'll recheck carotid Doppler studies.  HTN  Controlled on current medications      Runell Gess MD  Roseanne Reno 04/29/2013 5:07 PM\

## 2013-04-29 NOTE — Patient Instructions (Signed)
  We will see you back in follow up in 3 months with an Wilburt Finlay [facility] and 6 months with Dr Allyson Sabal.   Dr Allyson Sabal has ordered blood work to be done and a carotid doppler.

## 2013-04-29 NOTE — Assessment & Plan Note (Signed)
History of remote left femoropopliteal bypass grafting. I performed angiography on him 10/13/10 revealing a patent left femoropopliteal bypass graft, three-vessel runoff bilaterally with 40-50% stenoses in the iliacs and left common femoral. He did have 70-80% segmental calcified right SFA stenosis which has subsequently occluded by his most recent duplex ultrasound 03/04/13.  He really denies Lescol and claudication in 1 the end of a walker. I believe his English is limited because of multiple reasons including his COPD and generalized weakness.

## 2013-04-29 NOTE — Assessment & Plan Note (Signed)
On statin therapy with lipid profile performed 07/23/12 revealing a total cholesterol 243, LDL 156 HDL of 64

## 2013-05-01 ENCOUNTER — Telehealth: Payer: Self-pay

## 2013-05-01 ENCOUNTER — Encounter: Payer: Self-pay | Admitting: Internal Medicine

## 2013-05-01 ENCOUNTER — Ambulatory Visit (INDEPENDENT_AMBULATORY_CARE_PROVIDER_SITE_OTHER): Payer: Medicare Other | Admitting: Internal Medicine

## 2013-05-01 ENCOUNTER — Other Ambulatory Visit: Payer: Self-pay | Admitting: Internal Medicine

## 2013-05-01 ENCOUNTER — Ambulatory Visit (INDEPENDENT_AMBULATORY_CARE_PROVIDER_SITE_OTHER)
Admission: RE | Admit: 2013-05-01 | Discharge: 2013-05-01 | Disposition: A | Discharge status: Home / Self Care | Payer: Medicare Other | Source: Ambulatory Visit | Attending: Internal Medicine | Admitting: Internal Medicine

## 2013-05-01 VITALS — BP 122/54 | HR 99 | Temp 98.0°F | Wt 119.5 lb

## 2013-05-01 DIAGNOSIS — R5381 Other malaise: Secondary | ICD-10-CM

## 2013-05-01 DIAGNOSIS — R05 Cough: Secondary | ICD-10-CM

## 2013-05-01 DIAGNOSIS — I701 Atherosclerosis of renal artery: Secondary | ICD-10-CM | POA: Diagnosis not present

## 2013-05-01 DIAGNOSIS — R0602 Shortness of breath: Secondary | ICD-10-CM | POA: Diagnosis not present

## 2013-05-01 DIAGNOSIS — R059 Cough, unspecified: Secondary | ICD-10-CM

## 2013-05-01 DIAGNOSIS — R634 Abnormal weight loss: Secondary | ICD-10-CM | POA: Diagnosis not present

## 2013-05-01 DIAGNOSIS — R5383 Other fatigue: Principal | ICD-10-CM

## 2013-05-01 DIAGNOSIS — R21 Rash and other nonspecific skin eruption: Secondary | ICD-10-CM

## 2013-05-01 DIAGNOSIS — J449 Chronic obstructive pulmonary disease, unspecified: Secondary | ICD-10-CM | POA: Diagnosis not present

## 2013-05-01 DIAGNOSIS — Z125 Encounter for screening for malignant neoplasm of prostate: Secondary | ICD-10-CM

## 2013-05-01 DIAGNOSIS — N400 Enlarged prostate without lower urinary tract symptoms: Secondary | ICD-10-CM

## 2013-05-01 LAB — CBC WITH DIFFERENTIAL/PLATELET
Basophils Absolute: 0.1 10*3/uL (ref 0.0–0.1)
Basophils Relative: 1 % (ref 0–1)
Eosinophils Absolute: 0 10*3/uL (ref 0.0–0.7)
Eosinophils Relative: 0 % (ref 0–5)
HCT: 43.2 % (ref 39.0–52.0)
Hemoglobin: 15.1 g/dL (ref 13.0–17.0)
Lymphocytes Relative: 15 % (ref 12–46)
Lymphs Abs: 1.3 10*3/uL (ref 0.7–4.0)
MCH: 30.9 pg (ref 26.0–34.0)
MCHC: 35 g/dL (ref 30.0–36.0)
MCV: 88.3 fL (ref 78.0–100.0)
Monocytes Absolute: 1.1 10*3/uL — ABNORMAL HIGH (ref 0.1–1.0)
Monocytes Relative: 12 % (ref 3–12)
Neutro Abs: 6.3 10*3/uL (ref 1.7–7.7)
Neutrophils Relative %: 72 % (ref 43–77)
Platelets: 225 10*3/uL (ref 150–400)
RBC: 4.89 MIL/uL (ref 4.22–5.81)
RDW: 13.5 % (ref 11.5–15.5)
WBC: 8.8 10*3/uL (ref 4.0–10.5)

## 2013-05-01 LAB — COMPREHENSIVE METABOLIC PANEL
ALT: 16 U/L (ref 0–53)
AST: 28 U/L (ref 0–37)
Albumin: 3.2 g/dL — ABNORMAL LOW (ref 3.5–5.2)
Alkaline Phosphatase: 37 U/L — ABNORMAL LOW (ref 39–117)
BILIRUBIN TOTAL: 0.6 mg/dL (ref 0.2–1.2)
BUN: 31 mg/dL — ABNORMAL HIGH (ref 6–23)
CALCIUM: 8.5 mg/dL (ref 8.4–10.5)
CO2: 26 meq/L (ref 19–32)
Chloride: 91 mEq/L — ABNORMAL LOW (ref 96–112)
Creat: 1.02 mg/dL (ref 0.50–1.35)
GLUCOSE: 98 mg/dL (ref 70–99)
Potassium: 3.3 mEq/L — ABNORMAL LOW (ref 3.5–5.3)
SODIUM: 135 meq/L (ref 135–145)
TOTAL PROTEIN: 5.6 g/dL — AB (ref 6.0–8.3)

## 2013-05-01 LAB — AMYLASE: Amylase: 35 U/L (ref 0–105)

## 2013-05-01 LAB — C-REACTIVE PROTEIN: CRP: 18.1 mg/dL — ABNORMAL HIGH (ref <0.60)

## 2013-05-01 LAB — LIPASE: Lipase: 16 U/L (ref 0–75)

## 2013-05-01 MED ORDER — AMOXICILLIN 500 MG PO TABS: 500.0000 mg | ORAL_TABLET | Freq: Two times a day (BID) | ORAL | Status: DC

## 2013-05-01 MED ORDER — CIPROFLOXACIN-HYDROCORTISONE 0.2-1 % OT SUSP: 3.0000 [drp] | Freq: Two times a day (BID) | OTIC | Status: DC

## 2013-05-01 NOTE — Progress Notes (Signed)
Subjective:    Patient ID: Gary [name], male    DOB: 1935-08-06, 78 y.o.   MRN: [id number]  HPI  Pt presents to the clinic today to discuss weight loss. He is accompanied by his daughter. They are concerned with his 40 lb weight loss over the last year, increased fatigue and shortness of breath. He reports that his appetite is poor. He will take 3 bites and then feel full. He is having normal bowel movements. He is not sleeping well because he is concerned about his health condition. He reports that he "hurts all over". He is a current smoker. He does have a history of alcohol abuse.  He does have a history of CAD, PVD and COPD.  Review of Systems      Past Medical History  Diagnosis Date  . 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 Hypertension   . PVD (peripheral vascular disease) 04/27/2011    normal renal & abdominal aortography; s/p Left Fem-Pop BPG 2004.  Marland Kitchen Dyslipidemia   . Coronary artery disease 04/27/2011    Low risk Myoview in 2010:Non-obstructive disase, except for small OMB 95% ostial lesion  . H/O ETOH abuse   . GERD (gastroesophageal reflux disease)   . Alcohol abuse   . Carotid artery disease     Current Outpatient Prescriptions  Medication Sig Dispense Refill  . albuterol (PROVENTIL HFA;VENTOLIN HFA) 108 (90 BASE) MCG/ACT inhaler Inhale 2 puffs into the lungs every 4 (four) hours as needed for wheezing.  1 Inhaler  12  . amLODipine (NORVASC) 5 MG tablet Take 1 tablet (5 mg total) by mouth daily.  30 tablet  5  . aspirin 81 MG tablet Take 81 mg by mouth daily.      . chlorthalidone (HYGROTON) 25 MG tablet Take 1 tablet (25 mg total) by mouth daily.  30 tablet  5  . Choline Fenofibrate (FENOFIBRIC ACID) 135 MG CPDR TAKE 1 CAPSULE DAILY.  30 capsule  11  . co-enzyme Q-10 30 MG capsule Take 30 mg by mouth daily.      . enalapril (VASOTEC) 20 MG tablet Take 1 tablet (20 mg total) by mouth 2 (two) times daily.  60 tablet   6  . folic acid (FOLVITE) 1 MG tablet TAKE 1 TABLET DAILY.  30 tablet  11  . hydrALAZINE (APRESOLINE) 50 MG tablet Take 1.5 tablets (75 mg total) by mouth 3 (three) times daily.  135 tablet  11  . hydrochlorothiazide (MICROZIDE) 12.5 MG capsule Take 1 capsule (12.5 mg total) by mouth daily.  90 capsule  3  . metoprolol succinate (TOPROL-XL) 25 MG 24 hr tablet Take 25 mg by mouth daily.      . pantoprazole (PROTONIX) 40 MG tablet TAKE 1 TABLET (40 MG TOTAL) BY MOUTH DAILY.  30 tablet  3  . potassium chloride SA (K-DUR,KLOR-CON) 20 MEQ tablet Take 2 tablets (40 mEq total) by mouth daily.  60 tablet  6  . rosuvastatin (CRESTOR) 20 MG tablet Take 20 mg by mouth daily.      . vitamin B-12 (CYANOCOBALAMIN) 100 MCG tablet Take 100 mcg by mouth daily.      . ciprofloxacin-hydrocortisone (CIPRO HC OTIC) otic suspension Place 3 drops into the left ear 2 (two) times daily.  10 mL  0   No current facility-administered medications for this visit.    Allergies  Allergen Reactions  . Contrast Media [Iodinated Diagnostic Agents] Itching  .  Diovan [Valsartan] Swelling    angioedema  . Neosporin [Neomycin-Bacitracin Zn-Polymyx] Hives  . Neomycin Hives    Family History  Problem Relation Age of Onset  . CAD Brother     also stroke  . Hypertension Father     also stroke  . Hypertension Brother      X 2  . Kidney cancer Mother     History   Social History  . Marital Status: Divorced    Spouse Name: N/A    Number of Children: 3  . Years of Education: N/A   Occupational History  . welder, pipe fitter    Social History Main Topics  . Smoking status: Former Smoker -- 1.00 packs/day for 50 years    Types: Cigarettes    Quit date: 03/03/1997  . Smokeless tobacco: Former Neurosurgeon    Quit date: 04/27/1998  . Alcohol Use: 1.2 oz/week    2 Cans of beer per week  . Drug Use: No  . Sexual Activity: No   Other Topics Concern  . Not on file   Social History Narrative  . No narrative on file      Constitutional: Pt reports fatigue and weight loss. Denies fever, malaise, headache.  Respiratory: Pt reports shortness of breath. Denies difficulty breathing, cough or sputum production.   Cardiovascular: Denies chest pain, chest tightness, palpitations or swelling in the hands or feet.  Gastrointestinal: Denies abdominal pain, bloating, constipation, diarrhea or blood in the stool.  GU: Denies urgency, frequency, pain with urination, burning sensation, blood in urine, odor or discharge. Musculoskeletal: Pt reports body aches and difficulty with gait. Denies joint pain and swelling.  Skin: Denies redness, rashes, lesions or ulcercations.  Neurological: Pt reports problems with balance and coordination. Denies dizziness, difficulty with memory, difficulty with speech.   No other specific complaints in a complete review of systems (except as listed in HPI above).  Objective:   Physical Exam   BP 122/54  Pulse 99  Temp(Src) 98 F (36.7 C) (Oral)  Wt 119 lb 8 oz (54.205 kg)  SpO2 97% Wt Readings from Last 3 Encounters:  05/01/13 119 lb 8 oz (54.205 kg)  04/29/13 116 lb 11.2 oz (52.935 kg)  03/18/13 126 lb (57.153 kg)    General: Appears his stated age, ill appearing in NAD. Skin: Warm, dry and intact. Generalized rash noted, lesions are circular with erythematous base with a white coating. HEENT: Head: normal shape and size; Eyes: sclera white, no icterus, conjunctiva pink, PERRLA and EOMs intact; Ears: Tm's gray and intact, normal light reflex; Nose: mucosa pink and moist, septum midline; Throat/Mouth: Teeth present, mucosa pink and moist, no exudate, small lesion noted under right side of tongue.  Cardiovascular: Normal rate and rhythm. S1,S2 noted.  No murmur, rubs or gallops noted. No JVD or BLE edema. No carotid bruits noted. Pulmonary/Chest: Normal effort and intermittent expiratory wheeze noted. No respiratory distress. No rales or ronchi noted.  Abdomen: Soft and  nontender. Normal bowel sounds, no bruits noted. No distention or masses noted. Liver, spleen and kidneys non palpable. DRE: Prostate enlarged. Heme pos stool noted. Neurological: Alert and oriented. Cranial nerves II-XII intact. Coordination normal. +DTRs bilaterally. Psychiatric: Mood mildly depressed and affect normal. Behavior is normal. Judgment and thought content normal.     BMET    Component Value Date/Time   NA 134* 04/23/2013 0805   K 3.7 04/23/2013 0805   CL 96 04/23/2013 0805   CO2 28 04/23/2013 0805   GLUCOSE 98  04/23/2013 0805   BUN 16 04/23/2013 0805   CREATININE 0.78 04/23/2013 0805   CREATININE 0.60 06/25/2012 0716   CALCIUM 9.4 04/23/2013 0805   GFRNONAA >90 06/25/2012 0716   GFRAA >90 06/25/2012 0716    Lipid Panel     Component Value Date/Time   CHOL 243* 07/23/2012 1207   TRIG 114 07/23/2012 1207   HDL 64 07/23/2012 1207   CHOLHDL 3.8 07/23/2012 1207   VLDL 23 07/23/2012 1207   LDLCALC 156* 07/23/2012 1207    CBC    Component Value Date/Time   WBC 8.0 07/23/2012 1207   RBC 5.42 07/23/2012 1207   HGB 16.1 07/23/2012 1207   HCT 47.1 07/23/2012 1207   PLT 279 07/23/2012 1207   MCV 86.9 07/23/2012 1207   MCH 29.7 07/23/2012 1207   MCHC 34.2 07/23/2012 1207   RDW 14.9 07/23/2012 1207   LYMPHSABS 1.4 07/23/2012 1207   MONOABS 0.9 07/23/2012 1207   EOSABS 0.1 07/23/2012 1207   BASOSABS 0.0 07/23/2012 1207    Hgb A1C No results found for this basename: HGBA1C        Assessment & Plan:   Fatigue, weight loss, shortness of breath:  Will check chest xray to r/o pneumonia or any nodule concerning for cancer Will check CBC with diff, CMET, TSH, amylase, lipase, CRP and PSA Will followup with you as soon as labs come back  May need referral to GI for heme pos stool and weight loss  Rash:  Not a rash that I have ever seen He reports that it does not itch or hurt Will defer further evaluation of this at this time  If you notice increased fatigue, no appetite, more  weight loss, RTC

## 2013-05-01 NOTE — Telephone Encounter (Signed)
His insurance wont cover any alternative. Will try oral amoxil BID x 10 days

## 2013-05-01 NOTE — Patient Instructions (Addendum)

## 2013-05-01 NOTE — Progress Notes (Signed)
Pre visit review using our clinic review tool, if applicable. No additional management support is needed unless otherwise documented below in the visit note. 

## 2013-05-01 NOTE — Telephone Encounter (Signed)
[name] called back and [facility] got ear drop rx but cost of ear drops is over $200.00 and pt cannot afford. [name] request alternative med sent to [facility] [address]. [name] request cb.

## 2013-05-01 NOTE — Telephone Encounter (Signed)
Gary [name] pts daughter left v/m; pt was seen today and ear drops are not at [facility] [address]. Gary [name] request ear drops sent in before end of day.Please advise.

## 2013-05-02 LAB — PSA, MEDICARE: PSA: 0.35 ng/mL (ref <=4.00)

## 2013-05-02 LAB — TSH: TSH: 1.772 u[IU]/mL (ref 0.350–4.500)

## 2013-05-02 NOTE — Telephone Encounter (Signed)
Spoke to pt and patient's daughter and they are aware of change as instructed

## 2013-05-06 ENCOUNTER — Other Ambulatory Visit: Payer: Self-pay | Admitting: Internal Medicine

## 2013-05-06 ENCOUNTER — Telehealth: Payer: Self-pay

## 2013-05-06 DIAGNOSIS — R634 Abnormal weight loss: Secondary | ICD-10-CM

## 2013-05-06 DIAGNOSIS — T50995A Adverse effect of other drugs, medicaments and biological substances, initial encounter: Secondary | ICD-10-CM

## 2013-05-06 DIAGNOSIS — F1010 Alcohol abuse, uncomplicated: Secondary | ICD-10-CM

## 2013-05-06 DIAGNOSIS — R195 Other fecal abnormalities: Secondary | ICD-10-CM

## 2013-05-06 DIAGNOSIS — F17200 Nicotine dependence, unspecified, uncomplicated: Secondary | ICD-10-CM

## 2013-05-06 MED ORDER — PREDNISONE 50 MG PO TABS: ORAL_TABLET | ORAL | Status: DC

## 2013-05-06 MED ORDER — DIPHENHYDRAMINE HCL 50 MG PO TABS: ORAL_TABLET | ORAL | Status: DC

## 2013-05-06 NOTE — Telephone Encounter (Signed)
[name] left v/m; spoke with Nicki Reaper NP on 05/02/13 and discussed order for full body CT scan; [name] spoke with pt and they do want to proceed with full body CT scan. Please advise.

## 2013-05-06 NOTE — Telephone Encounter (Signed)
Ok, I will put the order in and Shirlee Limerick will contact him with the scheduling information

## 2013-05-15 ENCOUNTER — Ambulatory Visit (INDEPENDENT_AMBULATORY_CARE_PROVIDER_SITE_OTHER)
Admission: RE | Admit: 2013-05-15 | Discharge: 2013-05-15 | Disposition: A | Discharge status: Home / Self Care | Payer: Medicare Other | Source: Ambulatory Visit | Attending: Internal Medicine | Admitting: Internal Medicine

## 2013-05-15 DIAGNOSIS — F17200 Nicotine dependence, unspecified, uncomplicated: Secondary | ICD-10-CM

## 2013-05-15 DIAGNOSIS — R195 Other fecal abnormalities: Secondary | ICD-10-CM

## 2013-05-15 DIAGNOSIS — F1010 Alcohol abuse, uncomplicated: Secondary | ICD-10-CM

## 2013-05-15 DIAGNOSIS — J438 Other emphysema: Secondary | ICD-10-CM | POA: Diagnosis not present

## 2013-05-15 DIAGNOSIS — R634 Abnormal weight loss: Secondary | ICD-10-CM

## 2013-05-15 DIAGNOSIS — I700 Atherosclerosis of aorta: Secondary | ICD-10-CM | POA: Diagnosis not present

## 2013-05-15 MED ORDER — IOHEXOL 300 MG/ML  SOLN
100.0000 mL | Freq: Once | INTRAMUSCULAR | Status: AC | PRN
  Administered 2013-05-15: 100 mL via INTRAVENOUS

## 2013-05-16 ENCOUNTER — Other Ambulatory Visit: Payer: Self-pay | Admitting: Internal Medicine

## 2013-05-16 ENCOUNTER — Telehealth: Payer: Self-pay

## 2013-05-16 DIAGNOSIS — A310 Pulmonary mycobacterial infection: Secondary | ICD-10-CM

## 2013-05-16 NOTE — Telephone Encounter (Signed)
Pt's daughter,[name] left v/m requesting call back today on scan done at heart care facility. Please advise.

## 2013-05-16 NOTE — Telephone Encounter (Signed)
Spoke with daughter amy

## 2013-05-19 ENCOUNTER — Encounter: Payer: Self-pay | Admitting: Internal Medicine

## 2013-05-19 ENCOUNTER — Other Ambulatory Visit: Payer: Self-pay | Admitting: Internal Medicine

## 2013-05-19 ENCOUNTER — Ambulatory Visit (INDEPENDENT_AMBULATORY_CARE_PROVIDER_SITE_OTHER): Payer: Medicare Other | Admitting: Internal Medicine

## 2013-05-19 VITALS — BP 136/76 | HR 70 | Temp 98.2°F | Ht 67.5 in | Wt 120.0 lb

## 2013-05-19 DIAGNOSIS — A310 Pulmonary mycobacterial infection: Secondary | ICD-10-CM

## 2013-05-19 DIAGNOSIS — R059 Cough, unspecified: Secondary | ICD-10-CM | POA: Diagnosis not present

## 2013-05-19 DIAGNOSIS — I701 Atherosclerosis of renal artery: Secondary | ICD-10-CM | POA: Diagnosis not present

## 2013-05-19 DIAGNOSIS — R9389 Abnormal findings on diagnostic imaging of other specified body structures: Secondary | ICD-10-CM

## 2013-05-19 DIAGNOSIS — R05 Cough: Secondary | ICD-10-CM | POA: Diagnosis not present

## 2013-05-19 DIAGNOSIS — R058 Other specified cough: Secondary | ICD-10-CM

## 2013-05-19 MED ORDER — OLMESARTAN MEDOXOMIL 20 MG PO TABS: 20.0000 mg | ORAL_TABLET | Freq: Every day | ORAL | Status: DC

## 2013-05-19 NOTE — Progress Notes (Signed)
   Subjective:    Patient ID: Gary [name], male    DOB: 1935/10/29  MRN: [id number]  HPI  23 yowm remote smoker quit 1999 with doe abruptly worse with sore throat / dry cough/ ear aches x 03/2013 referred 05/19/2013 by Roby Lofts for copd/ ? MAI.  05/19/2013 1st [place] [facility] office visit/ [facility]  Chief Complaint  Patient presents with  . Pulmonary Consult    Referred per [facility]. Pt c/o SOB "for years" and has had cough x 2 months. Cough is non prod and esp worse at night. He states that he gets out of breath walking a block.   still coughing, no production, worse when lie down/ assoc with overt hb   No obvious other patterns in day to day or daytime variabilty or assoc chronic cough or cp or chest tightness, subjective wheeze overt sinus  symptoms. No unusual exp hx or h/o childhood pna/ asthma or knowledge of premature birth.  Sleeping ok without nocturnal  or early am exacerbation  of respiratory  c/o's or need for noct saba. Also denies any obvious fluctuation of symptoms with weather or environmental changes or other aggravating or alleviating factors except as outlined above   Current Medications, Allergies, Complete Past Medical History, Past Surgical History, Family History, and Social History were reviewed in [employer] record.              Review of Systems  Constitutional: Positive for appetite change and unexpected weight change. Negative for fever, chills and activity change.  HENT: Positive for sneezing. Negative for congestion, dental problem, postnasal drip, rhinorrhea, sore throat, trouble swallowing and voice change.   Eyes: Negative for visual disturbance.  Respiratory: Positive for cough and shortness of breath. Negative for choking.   Cardiovascular: Negative for chest pain and leg swelling.  Gastrointestinal: Negative for nausea, vomiting and abdominal pain.  Genitourinary: Negative for difficulty urinating.       Acid  heartburn  Indigestion  Musculoskeletal: Positive for arthralgias.  Skin: Negative for rash.  Psychiatric/Behavioral: Negative for behavioral problems and confusion.       Objective:   Physical Exam  Hoarse amb wm nad with mod pseudowheeze  Wt Readings from Last 3 Encounters:  05/19/13 120 lb (54.432 kg)  05/01/13 119 lb 8 oz (54.205 kg)  04/29/13 116 lb 11.2 oz (52.935 kg)       HEENT mild turbinate edema.  Oropharynx no thrush or excess pnd or cobblestoning.  No JVD or cervical adenopathy. Mild accessory muscle hypertrophy. Trachea midline, nl thryroid. Chest was hyperinflated by percussion with diminished breath sounds and moderate increased exp time without wheeze. Hoover sign positive at mid inspiration. Regular rate and rhythm without murmur gallop or rub or increase P2 or edema.  Abd: no hsm, nl excursion. Ext warm without cyanosis or clubbing.   CT  05/15/13  Findings within the left lung with differential considerations of  and early or mild infectious or inflammatory interstitial  infiltrate. An etiology such MAC if clinically appropriate cannot be  excluded. Clinical correlation recommended and short-term follow-up  in 3-4 months is recommended status post appropriate therapy.  Mild emphysematous changes within the lungs.  Areas likely representing mild scarring versus atelectasis within  the lung bases         Assessment & Plan:

## 2013-05-19 NOTE — Patient Instructions (Addendum)
Stop vasotec   Benicar 20 mg one daily in place of vasotec x one month trial to see how much better you feel (break in half if too strong and just take a half daily )   Please schedule a follow up office visit in 4 weeks, sooner if needed with cxr on return

## 2013-05-21 DIAGNOSIS — R058 Other specified cough: Secondary | ICD-10-CM | POA: Insufficient documentation

## 2013-05-21 DIAGNOSIS — R9389 Abnormal findings on diagnostic imaging of other specified body structures: Secondary | ICD-10-CM | POA: Insufficient documentation

## 2013-05-21 DIAGNOSIS — R05 Cough: Secondary | ICD-10-CM | POA: Insufficient documentation

## 2013-05-21 NOTE — Assessment & Plan Note (Signed)
The most common causes of chronic cough in immunocompetent adults include the following: upper airway cough syndrome (UACS), previously referred to as postnasal drip syndrome (PNDS), which is caused by variety of rhinosinus conditions; (2) asthma; (3) GERD; (4) chronic bronchitis from cigarette smoking or other inhaled environmental irritants; (5) nonasthmatic eosinophilic bronchitis; and (6) bronchiectasis.   These conditions, singly or in combination, have accounted for up to 94% of the causes of chronic cough in prospective studies.   Other conditions have constituted no >6% of the causes in prospective studies These have included bronchogenic carcinoma, chronic interstitial pneumonia, sarcoidosis, left ventricular failure, ACEI-induced cough, and aspiration from a condition associated with pharyngeal dysfunction.    Chronic cough is often simultaneously caused by more than one condition. A single cause has been found from 38 to 82% of the time, multiple causes from 18 to 62%. Multiply caused cough has been the result of three diseases up to 42% of the time.       Based on hx and exam, this is most likely:  Classic Upper airway cough syndrome, so named because it's frequently impossible to sort out how much is  CR/sinusitis with freq throat clearing (which can be related to primary GERD)   vs  causing  secondary (" extra esophageal")  GERD from wide swings in gastric pressure that occur with throat clearing, often  promoting self use of mint and menthol lozenges that reduce the lower esophageal sphincter tone and exacerbate the problem further in a cyclical fashion.   These are the same pts (now being labeled as having "irritable larynx syndrome" by some cough centers) who not infrequently have a history of having failed to tolerate ace inhibitors (whic is likely the case here)  dry powder inhalers or biphosphonates or report having atypical reflux symptoms(as may be the case here)  that don't respond  to standard doses of PPI , and are easily confused as having aecopd or asthma flares by even experienced allergists/ pulmonologists.   The first step is to stop acei x 1 month and continue maximize acid suppression  Then regroup   See instructions for specific recommendations which were reviewed directly with the patient who was given a copy with highlighter outlining the key components.

## 2013-05-21 NOTE — Assessment & Plan Note (Signed)
-   see CT 05/15/13 ? MAI  Although there are clearly abnormalities on CT scan, they should probably be considered "microscopic" since not obvious on plain cxr .     In the setting of obvious "macroscopic" health issues,  I am very reluctatnt to embark on an invasive w/u at this point but will arrange consevative  follow up and in the meantime see what we can do to address the patient's subjective concerns which I doubt have anything to do with his cough but time will tell in that regard

## 2013-06-17 ENCOUNTER — Encounter: Payer: Self-pay | Admitting: Internal Medicine

## 2013-06-17 ENCOUNTER — Ambulatory Visit (INDEPENDENT_AMBULATORY_CARE_PROVIDER_SITE_OTHER)
Admission: RE | Admit: 2013-06-17 | Discharge: 2013-06-17 | Disposition: A | Discharge status: Home / Self Care | Payer: Medicare Other | Source: Ambulatory Visit | Attending: Internal Medicine | Admitting: Internal Medicine

## 2013-06-17 ENCOUNTER — Ambulatory Visit (INDEPENDENT_AMBULATORY_CARE_PROVIDER_SITE_OTHER): Payer: Medicare Other | Admitting: Internal Medicine

## 2013-06-17 VITALS — BP 124/56 | HR 62 | Temp 98.1°F | Ht 67.0 in | Wt 119.4 lb

## 2013-06-17 DIAGNOSIS — R05 Cough: Secondary | ICD-10-CM

## 2013-06-17 DIAGNOSIS — R059 Cough, unspecified: Secondary | ICD-10-CM

## 2013-06-17 DIAGNOSIS — J449 Chronic obstructive pulmonary disease, unspecified: Secondary | ICD-10-CM | POA: Diagnosis not present

## 2013-06-17 DIAGNOSIS — R058 Other specified cough: Secondary | ICD-10-CM

## 2013-06-17 DIAGNOSIS — R9389 Abnormal findings on diagnostic imaging of other specified body structures: Secondary | ICD-10-CM | POA: Diagnosis not present

## 2013-06-17 DIAGNOSIS — I701 Atherosclerosis of renal artery: Secondary | ICD-10-CM

## 2013-06-17 MED ORDER — OLMESARTAN MEDOXOMIL 20 MG PO TABS: 20.0000 mg | ORAL_TABLET | Freq: Every day | ORAL | Status: DC

## 2013-06-17 MED ORDER — PANTOPRAZOLE SODIUM 40 MG PO TBEC: DELAYED_RELEASE_TABLET | ORAL | Status: DC

## 2013-06-17 NOTE — Progress Notes (Signed)
Subjective:    Patient ID: Gary [name], male    DOB: [birth year]  MRN: [id number]   Brief patient profile:  89 yowm remote smoker quit 1999 with doe abruptly worse with sore throat / dry cough/ ear aches x 03/2013 referred 05/19/2013 by Roby Loftsegina Beaity for copd/ ? MAI.   History of Present Illness  05/19/2013 1st [place] [facility] office visit/ Gary [name]  Chief Complaint  Patient presents with  . Pulmonary Consult    [facility] Pt c/o SOB "for years" and has had cough x 2 months. Cough is non prod and esp worse at night. He states that he gets out of breath walking a block.   still coughing, no production, worse when lie down/ assoc with overt hb  rec Stop vasotec  Benicar 20 mg one daily in place of vasotec x one month trial to see how much better you feel (break in half if too strong and just take a half daily )    06/17/2013 f/u ov/Gary [name] re: cough > sob  Chief Complaint  Patient presents with  . Follow-up with cxr    Pt states that his SOB and cough are unchanged since last visit. No new co's today.  not sure what could do prior to onset of present illness that can't do now due to feet are the main limiting problem Worse cough afternoon and supper.   No obvious day to day or daytime variabilty or assoc   cp or chest tightness, subjective wheeze overt sinus or hb symptoms. No unusual exp hx or h/o childhood pna/ asthma or knowledge of premature birth.  Sleeping ok without nocturnal  or early am exacerbation  of respiratory  c/o's or need for noct saba. Also denies any obvious fluctuation of symptoms with weather or environmental changes or other aggravating or alleviating factors except as outlined above   Current Medications, Allergies, Complete Past Medical History, Past Surgical History, Family History, and Social History were reviewed in [facility] electronic [facility] record.  ROS  The following are not active complaints unless bolded sore throat, dysphagia,  dental problems, itching, sneezing,  nasal congestion or excess/ purulent secretions, ear ache,   fever, chills, sweats, unintended wt loss, pleuritic or exertional cp, hemoptysis,  orthopnea pnd or leg swelling, presyncope, palpitations, heartburn, abdominal pain, anorexia, nausea, vomiting, diarrhea  or change in bowel or urinary habits, change in stools or urine, dysuria,hematuria,  rash, arthralgias, visual complaints, headache, numbness weakness or ataxia or problems with walking or coordination,  change in mood/affect or memory.                            Objective:   Physical Exam  Hoarse amb wm nad with no longer with sign pseudowheeze but very somber easily frustrated with questions re symptoms  06/19/2013        119  Wt Readings from Last 3 Encounters:  05/19/13 120 lb (54.432 kg)  05/01/13 119 lb 8 oz (54.205 kg)  04/29/13 116 lb 11.2 oz (52.935 kg)       HEENT mild turbinate edema.  Oropharynx no thrush or excess pnd or cobblestoning.  No JVD or cervical adenopathy. Mild accessory muscle hypertrophy. Trachea midline, nl thryroid. Chest was hyperinflated by percussion with diminished breath sounds and moderate increased exp time without wheeze. Hoover sign positive at mid inspiration. Regular rate and rhythm without murmur gallop or rub or increase P2 or edema.  Abd:  no hsm, nl excursion. Ext warm without cyanosis or clubbing.      CXR  06/17/2013 : The heart size and mediastinal contours are within normal limits.  Atherosclerotic calcifications within the aorta. Minimal areas of  linear scarring within the right lung base. No focal regions of  consolidation or focal infiltrates. No acute osseous abnormalities.  Lungs are hyperinflated.         Assessment & Plan:

## 2013-06-17 NOTE — Progress Notes (Signed)
Quick Note:  LMTCB ______ 

## 2013-06-17 NOTE — Patient Instructions (Addendum)
Pantoprazole 40 mg Take 30- 60 min before your first and last meals of the day   GERD (REFLUX)  is an extremely common cause of respiratory symptoms, many times with no significant heartburn at all.    It can be treated with medication, but also with lifestyle changes including avoidance of late meals, excessive alcohol, smoking cessation, and avoid fatty foods, chocolate, peppermint, colas, red wine, and acidic juices such as orange juice.  NO MINT OR MENTHOL PRODUCTS SO NO COUGH DROPS  USE SUGARLESS CANDY INSTEAD (jolley ranchers or Stover's)  NO OIL BASED VITAMINS - use powdered substitutes.  If not satisfied with breathing or coughing then we need to see you back in one month to regroup - if better return to [facility] for follow up then

## 2013-06-18 NOTE — Progress Notes (Signed)
Quick Note:  Spoke with pt and notified of results per Dr. Wert. Pt verbalized understanding and denied any questions.  ______ 

## 2013-06-19 NOTE — Assessment & Plan Note (Signed)
-   trial off acei 05/19/2013 > improved marginally  Next step is max gerd rx then regroup if not satisfied but not convinced at all this cough has anything at all to do with his ct

## 2013-06-19 NOTE — Assessment & Plan Note (Signed)
-   see CT 05/15/13 ? MAI   No abnormalities on plain cxr so unable to say when ct abn's developed and certainly his hx does not support active pulmonary infection of any kind so rec f/u conservatively for now > would need fob/bal to consider trial of longterm rx and not clear at all at this point the risk/benefit favors any other approach

## 2013-06-27 ENCOUNTER — Other Ambulatory Visit: Payer: Self-pay | Admitting: Internal Medicine

## 2013-06-27 MED ORDER — LOSARTAN POTASSIUM 100 MG PO TABS: 100.0000 mg | ORAL_TABLET | Freq: Every day | ORAL | Status: DC

## 2013-07-01 ENCOUNTER — Other Ambulatory Visit: Payer: Self-pay

## 2013-07-01 MED ORDER — ATORVASTATIN CALCIUM 10 MG PO TABS: 10.0000 mg | ORAL_TABLET | Freq: Every day | ORAL | Status: DC

## 2013-07-01 NOTE — Telephone Encounter (Signed)
Rx was sent to pharmacy electronically. 

## 2013-07-03 ENCOUNTER — Telehealth: Payer: Self-pay | Admitting: Cardiovascular Disease

## 2013-07-03 ENCOUNTER — Telehealth (HOSPITAL_COMMUNITY): Payer: Self-pay | Admitting: *Deleted

## 2013-07-03 DIAGNOSIS — I739 Peripheral vascular disease, unspecified: Secondary | ICD-10-CM

## 2013-07-03 DIAGNOSIS — I779 Disorder of arteries and arterioles, unspecified: Secondary | ICD-10-CM

## 2013-07-03 NOTE — Telephone Encounter (Signed)
She wants to know does the pt still need to be scheduled for a doppler?

## 2013-07-03 NOTE — Telephone Encounter (Signed)
RN spoke to Gary [name]. She states she received a letter stating Gary [name] need an appointment with Gary Grieve [name] - 3 month follow up . She also question if he needs a carotid doppler done as well. She states she remembered Gary [name] mentioned the doppler at the last office visit.She would like to have both done on the same day as if possible. RN reviewed the last office visit. Gary [name] did states he wants a carotid  Doppler. RN informed Gary [name], that RN wiil so her best to place doppler and appointment on the same day. RN asked if there is any days that are better than any other.   Gary [name] states she is off work until June 12 ,2015. If possible any day prior to the 12th.  RN reviewed the schedule will be able to do both on June 12,2015. Patient will have to see Gary Shelter [name] instead of Gary [name] due to no availability on [place] schedule. Gary [name] is aware and apointment will be with Gary [name] at 3;15 pm.  RN contacted Gary [name] - will make an appointment for carotid doppler for 07/17/13

## 2013-07-17 ENCOUNTER — Encounter: Payer: Self-pay | Admitting: Cardiology

## 2013-07-17 ENCOUNTER — Encounter: Payer: Self-pay | Admitting: Cardiovascular Disease

## 2013-07-17 ENCOUNTER — Ambulatory Visit (INDEPENDENT_AMBULATORY_CARE_PROVIDER_SITE_OTHER): Payer: Medicare Other | Admitting: Cardiology

## 2013-07-17 ENCOUNTER — Ambulatory Visit (HOSPITAL_COMMUNITY)
Admission: RE | Admit: 2013-07-17 | Discharge: 2013-07-17 | Disposition: A | Discharge status: Home / Self Care | Payer: Medicare Other | Source: Ambulatory Visit | Attending: Cardiovascular Disease | Admitting: Cardiovascular Disease

## 2013-07-17 VITALS — BP 164/58 | HR 78 | Ht 67.0 in | Wt 117.0 lb

## 2013-07-17 DIAGNOSIS — I739 Peripheral vascular disease, unspecified: Secondary | ICD-10-CM

## 2013-07-17 DIAGNOSIS — I999 Unspecified disorder of circulatory system: Secondary | ICD-10-CM | POA: Diagnosis not present

## 2013-07-17 DIAGNOSIS — D689 Coagulation defect, unspecified: Secondary | ICD-10-CM | POA: Diagnosis not present

## 2013-07-17 DIAGNOSIS — I701 Atherosclerosis of renal artery: Secondary | ICD-10-CM | POA: Diagnosis not present

## 2013-07-17 DIAGNOSIS — I779 Disorder of arteries and arterioles, unspecified: Secondary | ICD-10-CM

## 2013-07-17 DIAGNOSIS — I10 Essential (primary) hypertension: Secondary | ICD-10-CM

## 2013-07-17 DIAGNOSIS — I2510 Atherosclerotic heart disease of native coronary artery without angina pectoris: Secondary | ICD-10-CM | POA: Diagnosis not present

## 2013-07-17 DIAGNOSIS — I6529 Occlusion and stenosis of unspecified carotid artery: Secondary | ICD-10-CM

## 2013-07-17 NOTE — Assessment & Plan Note (Signed)
RICA 70-99% by dopplers

## 2013-07-17 NOTE — Progress Notes (Signed)
07/17/2013 Gary [name]   [birth year]  [id number]  Primary Physicia Nicki Reaper, NP Primary Cardiologist: Dr Allyson Sabal  HPI:  Pt is a 78 y.o. male with a history of tobacco abuse, but quit smoking almost 16 years ago, hypertension, EtOH abuse, COPD, PAD, dyslipidemia, status post LFPBPG 2004 by Dr Arbie Cookey. His last peripheral angiogram was September 2012 which showed a patent left fem-pop, 70% to 80% tandem SFA stenosis. 2D echo in July 2014 revealed an EF of 55-60%, grade one diastolic dysfunction and peak PA pressures of . The pt presented with suspected STEMI in 2013 but had non obstructive CAD at cath.  Doppler studies performed in our office 1/27//15 revealed an occluded right SFA which is a new finding and high-grade disease in his left common femoral and popliteal artery with a patent femoropopliteal bypass graft. He does appear to have a proximal anastomotic lesion. Carotid dopplers done today show severe RICA disease 70-99%. He has not had symptoms of stroke or TIA. He had some Rt leg pain but its not clear this was claudication.               The pt was seen by Dr Allyson Sabal and myself. Dr Allyson Sabal feels we should proceed with lower extremity and carotid dopplers.     Current Outpatient Prescriptions  Medication Sig Dispense Refill  . albuterol (PROVENTIL HFA;VENTOLIN HFA) 108 (90 BASE) MCG/ACT inhaler Inhale 2 puffs into the lungs every 4 (four) hours as needed for wheezing.  1 Inhaler  12  . amLODipine (NORVASC) 5 MG tablet Take 1 tablet (5 mg total) by mouth daily.  30 tablet  5  . aspirin 81 MG tablet Take 81 mg by mouth daily.      Marland Kitchen atorvastatin (LIPITOR) 10 MG tablet Take 1 tablet (10 mg total) by mouth daily.  30 tablet  10  . chlorthalidone (HYGROTON) 25 MG tablet Take 1 tablet (25 mg total) by mouth daily.  30 tablet  5  . Choline Fenofibrate (FENOFIBRIC ACID) 135 MG CPDR TAKE 1 CAPSULE DAILY.  30 capsule  11  . co-enzyme Q-10 30 MG capsule Take 30 mg by mouth daily.      .  folic acid (FOLVITE) 1 MG tablet TAKE 1 TABLET DAILY.  30 tablet  11  . hydrALAZINE (APRESOLINE) 50 MG tablet Take 1.5 tablets (75 mg total) by mouth 3 (three) times daily.  135 tablet  11  . loratadine (CLARITIN) 10 MG tablet Take 10 mg by mouth daily as needed.       Marland Kitchen losartan (COZAAR) 100 MG tablet Take 1 tablet (100 mg total) by mouth daily.  30 tablet  11  . metoprolol succinate (TOPROL-XL) 25 MG 24 hr tablet Take 25 mg by mouth daily.      . pantoprazole (PROTONIX) 40 MG tablet Take 30- 60 min before your first and last meals of the day  60 tablet  3  . pseudoephedrine (SUDAFED) 60 MG tablet Take 60 mg by mouth every 4 (four) hours as needed for congestion.      . vitamin B-12 (CYANOCOBALAMIN) 100 MCG tablet Take 100 mcg by mouth daily.       No current facility-administered medications for this visit.    Allergies  Allergen Reactions  . Contrast Media [Iodinated Diagnostic Agents] Itching  . Diovan [Valsartan] Swelling    angioedema  . Neosporin [Neomycin-Bacitracin Zn-Polymyx] Hives  . Neomycin Hives    History   Social History  . Marital  Status: Divorced    Spouse Name: N/A    Number of Children: 3  . Years of Education: N/A   Occupational History  . welder, pipe fitter-Retired    Social History Main Topics  . Smoking status: Former Smoker -- 1.00 packs/day for 50 years    Types: Cigarettes    Quit date: 03/03/1997  . Smokeless tobacco: Former User    Quit date: 04/27/1998  . Alcohol Use: 1.2 oz/week    2 Cans of beer per week  . Drug Use: No  . Sexual Activity: No   Other Topics Concern  . Not on file   Social History Narrative  . No narrative on file     Review of Systems: General: negative for chills, fever, night sweats or weight changes.  Cardiovascular: negative for chest pain, dyspnea on exertion, edema, orthopnea, palpitations, paroxysmal nocturnal dyspnea or shortness of breath Dermatological: negative for rash Respiratory: negative for cough  or wheezing Urologic: negative for hematuria Abdominal: negative for nausea, vomiting, diarrhea, bright red blood per rectum, melena, or hematemesis Neurologic: negative for visual changes, syncope, or dizziness All other systems reviewed and are otherwise negative except as noted above.    Blood pressure 164/58, pulse 78, height 5' 7" (1.702 m), weight 117 lb (53.071 kg).  General appearance: alert, cooperative, cachectic and no distress Neck: no JVD, supple, symmetrical, trachea midline and bilat carotid bruits Rt > Lt Lungs: clear to auscultation bilaterally and decreased breath sounds Heart: regular rate and rhythm Abdomen: soft, non-tender; bowel sounds normal; no masses,  no organomegaly Extremities: no edema or open wounds Pulses: diminnished bilat Skin: pale. cool, dry Neurologic: Grossly normal   ASSESSMENT AND PLAN:   Carotid artery disease RICA 70-99% by dopplers  PVD, LFBPG 2004, dopplers show progression of disease bliat Dopplers suggest progression of disease bilat  HTN  Controlled  CAD- non obstructive CAD by cath 04/19/11 No chest pain    PLAN  Reviewed with Dr Berry, plan LE and carotid angiogram  Ellin Fitzgibbons KPA-C 07/17/2013 5:25 PM 

## 2013-07-17 NOTE — Assessment & Plan Note (Signed)
Controlled.  

## 2013-07-17 NOTE — Assessment & Plan Note (Signed)
Dopplers suggest progression of disease bilat

## 2013-07-17 NOTE — Progress Notes (Signed)
Carotid Duplex Completed. Grantley Savage, BS, RDMS, RVT  

## 2013-07-17 NOTE — Patient Instructions (Signed)
Dr. Allyson Sabal has ordered a peripheral angiogram (lower extremity and carotid angiogram only) to be done at [facility].  This procedure is going to look at the bloodflow in your lower extremities.  If Dr. Allyson Sabal is able to open up the arteries, you will have to spend one night in the hospital.  If he is not able to open the arteries, you will be able to go home that same day.    After the procedure, you will not be allowed to drive for 3 days or push, pull, or lift anything greater than 10 lbs for one week.    You will be required to have the following tests prior to the procedure:  1. Blood work-the blood work can be done no more than 7 days prior to the procedure.  It can be done at any [facility] lab.  There is one downstairs on the first floor of this building and one in the [facility] ([address])  .

## 2013-07-17 NOTE — Assessment & Plan Note (Signed)
No chest pain

## 2013-07-19 ENCOUNTER — Other Ambulatory Visit: Payer: Self-pay | Admitting: Internal Medicine

## 2013-07-21 ENCOUNTER — Other Ambulatory Visit: Payer: Self-pay | Admitting: *Deleted

## 2013-07-21 MED ORDER — ATORVASTATIN CALCIUM 10 MG PO TABS: 10.0000 mg | ORAL_TABLET | Freq: Every day | ORAL | Status: DC

## 2013-07-21 NOTE — Telephone Encounter (Signed)
Ok to phone in xanax 

## 2013-07-21 NOTE — Telephone Encounter (Signed)
Rx called in to pharmacy. 

## 2013-07-21 NOTE — Telephone Encounter (Signed)
Last OV 04/2013 and it looks like this medication was last filled 6/14--please advise

## 2013-07-28 DIAGNOSIS — I999 Unspecified disorder of circulatory system: Secondary | ICD-10-CM | POA: Diagnosis not present

## 2013-07-28 DIAGNOSIS — D689 Coagulation defect, unspecified: Secondary | ICD-10-CM | POA: Diagnosis not present

## 2013-07-28 LAB — CBC
HCT: 38.8 % — ABNORMAL LOW (ref 39.0–52.0)
Hemoglobin: 13.5 g/dL (ref 13.0–17.0)
MCH: 31.4 pg (ref 26.0–34.0)
MCHC: 34.8 g/dL (ref 30.0–36.0)
MCV: 90.2 fL (ref 78.0–100.0)
Platelets: 305 10*3/uL (ref 150–400)
RBC: 4.3 MIL/uL (ref 4.22–5.81)
RDW: 14.2 % (ref 11.5–15.5)
WBC: 6.2 10*3/uL (ref 4.0–10.5)

## 2013-07-28 LAB — BASIC METABOLIC PANEL
BUN: 14 mg/dL (ref 6–23)
CO2: 30 mEq/L (ref 19–32)
Calcium: 9.4 mg/dL (ref 8.4–10.5)
Chloride: 93 mEq/L — ABNORMAL LOW (ref 96–112)
Creat: 0.73 mg/dL (ref 0.50–1.35)
Glucose, Bld: 92 mg/dL (ref 70–99)
Potassium: 3.8 mEq/L (ref 3.5–5.3)
Sodium: 131 mEq/L — ABNORMAL LOW (ref 135–145)

## 2013-07-28 LAB — PROTIME-INR
INR: 0.99 (ref <1.50)
Prothrombin Time: 13 seconds (ref 11.6–15.2)

## 2013-07-28 LAB — APTT: aPTT: 32 seconds (ref 24–37)

## 2013-08-01 ENCOUNTER — Other Ambulatory Visit: Payer: Self-pay | Admitting: *Deleted

## 2013-08-01 ENCOUNTER — Telehealth: Payer: Self-pay | Admitting: Cardiovascular Disease

## 2013-08-01 DIAGNOSIS — Z01810 Encounter for preprocedural cardiovascular examination: Secondary | ICD-10-CM

## 2013-08-01 NOTE — Telephone Encounter (Signed)
cher was calling to request procedure orders for Monday. Per Samara Deist patient has appointment to day and orders will be entered after this visit.

## 2013-08-04 ENCOUNTER — Encounter (HOSPITAL_COMMUNITY)
Admission: RE | Disposition: A | Discharge status: Home / Self Care | Payer: Self-pay | Source: Ambulatory Visit | Attending: Cardiovascular Disease

## 2013-08-04 ENCOUNTER — Ambulatory Visit (HOSPITAL_COMMUNITY)
Admission: RE | Admit: 2013-08-04 | Discharge: 2013-08-04 | Disposition: A | Discharge status: Home / Self Care | Payer: Medicare Other | Source: Ambulatory Visit | Attending: Cardiovascular Disease | Admitting: Cardiovascular Disease

## 2013-08-04 DIAGNOSIS — I63239 Cerebral infarction due to unspecified occlusion or stenosis of unspecified carotid arteries: Secondary | ICD-10-CM

## 2013-08-04 DIAGNOSIS — Z7982 Long term (current) use of aspirin: Secondary | ICD-10-CM | POA: Insufficient documentation

## 2013-08-04 DIAGNOSIS — I700 Atherosclerosis of aorta: Secondary | ICD-10-CM | POA: Insufficient documentation

## 2013-08-04 DIAGNOSIS — I6529 Occlusion and stenosis of unspecified carotid artery: Secondary | ICD-10-CM | POA: Insufficient documentation

## 2013-08-04 DIAGNOSIS — J4489 Other specified chronic obstructive pulmonary disease: Secondary | ICD-10-CM | POA: Insufficient documentation

## 2013-08-04 DIAGNOSIS — I10 Essential (primary) hypertension: Secondary | ICD-10-CM | POA: Insufficient documentation

## 2013-08-04 DIAGNOSIS — I771 Stricture of artery: Secondary | ICD-10-CM | POA: Insufficient documentation

## 2013-08-04 DIAGNOSIS — I70209 Unspecified atherosclerosis of native arteries of extremities, unspecified extremity: Secondary | ICD-10-CM | POA: Insufficient documentation

## 2013-08-04 DIAGNOSIS — E785 Hyperlipidemia, unspecified: Secondary | ICD-10-CM | POA: Insufficient documentation

## 2013-08-04 DIAGNOSIS — J449 Chronic obstructive pulmonary disease, unspecified: Secondary | ICD-10-CM | POA: Insufficient documentation

## 2013-08-04 DIAGNOSIS — Z01810 Encounter for preprocedural cardiovascular examination: Secondary | ICD-10-CM

## 2013-08-04 DIAGNOSIS — I2510 Atherosclerotic heart disease of native coronary artery without angina pectoris: Secondary | ICD-10-CM | POA: Insufficient documentation

## 2013-08-04 DIAGNOSIS — I708 Atherosclerosis of other arteries: Secondary | ICD-10-CM | POA: Insufficient documentation

## 2013-08-04 DIAGNOSIS — Z87891 Personal history of nicotine dependence: Secondary | ICD-10-CM | POA: Insufficient documentation

## 2013-08-04 DIAGNOSIS — I739 Peripheral vascular disease, unspecified: Secondary | ICD-10-CM | POA: Diagnosis not present

## 2013-08-04 DIAGNOSIS — I701 Atherosclerosis of renal artery: Secondary | ICD-10-CM | POA: Insufficient documentation

## 2013-08-04 DIAGNOSIS — F1010 Alcohol abuse, uncomplicated: Secondary | ICD-10-CM | POA: Insufficient documentation

## 2013-08-04 HISTORY — PX: CAROTID ANGIOGRAM: SHX5504

## 2013-08-04 HISTORY — PX: LOWER EXTREMITY ANGIOGRAM: SHX5508

## 2013-08-04 LAB — POCT ACTIVATED CLOTTING TIME: ACTIVATED CLOTTING TIME: 163 s

## 2013-08-04 SURGERY — ANGIOGRAM, LOWER EXTREMITY
Anesthesia: LOCAL

## 2013-08-04 MED ORDER — HEPARIN (PORCINE) IN NACL 2-0.9 UNIT/ML-% IJ SOLN
INTRAMUSCULAR | Status: AC
  Filled 2013-08-04: qty 1000

## 2013-08-04 MED ORDER — SODIUM CHLORIDE 0.9 % IV SOLN: INTRAVENOUS | Status: DC

## 2013-08-04 MED ORDER — HEPARIN SODIUM (PORCINE) 1000 UNIT/ML IJ SOLN
INTRAMUSCULAR | Status: AC
  Filled 2013-08-04: qty 1

## 2013-08-04 MED ORDER — HYDRALAZINE HCL 20 MG/ML IJ SOLN
10.0000 mg | INTRAMUSCULAR | Status: DC | PRN
  Administered 2013-08-04: 10 mg via INTRAVENOUS

## 2013-08-04 MED ORDER — HYDRALAZINE HCL 20 MG/ML IJ SOLN
INTRAMUSCULAR | Status: AC
  Filled 2013-08-04: qty 1

## 2013-08-04 MED ORDER — DIPHENHYDRAMINE HCL 50 MG/ML IJ SOLN
25.0000 mg | INTRAMUSCULAR | Status: AC
  Administered 2013-08-04: 25 mg via INTRAVENOUS
  Filled 2013-08-04: qty 1

## 2013-08-04 MED ORDER — FAMOTIDINE IN NACL 20-0.9 MG/50ML-% IV SOLN
20.0000 mg | INTRAVENOUS | Status: AC
  Administered 2013-08-04: 20 mg via INTRAVENOUS
  Filled 2013-08-04: qty 50

## 2013-08-04 MED ORDER — METHYLPREDNISOLONE SODIUM SUCC 125 MG IJ SOLR
INTRAMUSCULAR | Status: AC
  Administered 2013-08-04: 125 mg via INTRAVENOUS
  Filled 2013-08-04: qty 2

## 2013-08-04 MED ORDER — METHYLPREDNISOLONE SODIUM SUCC 125 MG IJ SOLR: 125.0000 mg | INTRAMUSCULAR | Status: DC

## 2013-08-04 MED ORDER — ASPIRIN 81 MG PO CHEW
81.0000 mg | CHEWABLE_TABLET | ORAL | Status: AC
  Administered 2013-08-04: 81 mg via ORAL
  Filled 2013-08-04: qty 1

## 2013-08-04 MED ORDER — METHYLPREDNISOLONE SODIUM SUCC 125 MG IJ SOLR
125.0000 mg | INTRAMUSCULAR | Status: AC
  Administered 2013-08-04: 125 mg via INTRAVENOUS

## 2013-08-04 MED ORDER — SODIUM CHLORIDE 0.9 % IJ SOLN: 3.0000 mL | INTRAMUSCULAR | Status: DC | PRN

## 2013-08-04 MED ORDER — LIDOCAINE HCL (PF) 1 % IJ SOLN
INTRAMUSCULAR | Status: AC
  Filled 2013-08-04: qty 30

## 2013-08-04 MED ORDER — HYDRALAZINE HCL 20 MG/ML IJ SOLN: 10.0000 mg | INTRAMUSCULAR | Status: DC | PRN

## 2013-08-04 NOTE — Progress Notes (Signed)
Site area:right groin  Site Prior to Removal:  Level 0  Pressure Applied For 25 MINUTES    Minutes Beginning at 11:10:00  Manual:   Yes.    Patient Status During Pull:   Stable and painfree  Post Pull Groin Site:  Level 0  Post Pull Instructions Given:  Yes.    Post Pull Pulses Present:  Yes.    Dressing Applied:  Yes.    Comments: right DP pulse remained present with doppler during manual pressure required for sheath pull

## 2013-08-04 NOTE — Discharge Instructions (Signed)
Angiogram, Care After °Refer to this sheet in the next few weeks. These instructions provide you with information on caring for yourself after your procedure. Your health care provider may also give you more specific instructions. Your treatment has been planned according to current medical practices, but problems sometimes occur. Call your health care provider if you have any problems or questions after your procedure.  °WHAT TO EXPECT AFTER THE PROCEDURE °After your procedure, it is typical to have the following sensations: °· Minor discomfort or tenderness and a small bump at the catheter insertion site. The bump should usually decrease in size and tenderness within 1 to 2 weeks. °· Any bruising will usually fade within 2 to 4 weeks. °HOME CARE INSTRUCTIONS  °· You may need to keep taking blood thinners if they were prescribed for you. Only take over-the-counter or prescription medicines for pain, fever, or discomfort as directed by your health care provider. °· Do not apply powder or lotion to the site. °· Do not sit in a bathtub, swimming pool, or whirlpool for 5 to 7 days. °· You may shower 24 hours after the procedure. Remove the bandage (dressing) and gently wash the site with plain soap and water. Gently pat the site dry. °· Inspect the site at least twice daily. °· Limit your activity for the first 48 hours. Do not bend, squat, or lift anything over 20 lb (9 kg) or as directed by your health care provider. °· Do not drive home if you are discharged the day of the procedure. Have someone else drive you. Follow instructions about when you can drive or return to work. °SEEK MEDICAL CARE IF: °· You get lightheaded when standing up. °· You have drainage (other than a small amount of blood on the dressing). °· You have chills. °· You have a fever. °· You have redness, warmth, swelling, or pain at the insertion site. °SEEK IMMEDIATE MEDICAL CARE IF:  °· You develop chest pain or shortness of breath, feel faint,  or pass out. °· You have bleeding, swelling larger than a walnut, or drainage from the catheter insertion site. °· You develop pain, discoloration, coldness, or severe bruising in the leg or arm that held the catheter. °· You develop bleeding from any other place, such as the bowels. You may see bright red blood in your urine or stools, or your stools may appear black and tarry. °· You have heavy bleeding from the site. If this happens, hold pressure on the site. °MAKE SURE YOU: °· Understand these instructions. °· Will watch your condition. °· Will get help right away if you are not doing well or get worse. °Document Released: 08/11/2004 Document Revised: 01/28/2013 Document Reviewed: 06/17/2012 °ExitCare® Patient Information ©2015 [employer]. This information is not intended to replace advice given to you by your health care provider. Make sure you discuss any questions you have with your health care provider. ° °

## 2013-08-04 NOTE — Progress Notes (Addendum)
Report to [place] by Luna Kitchens Transferred to P13. No complications Bedrest began @ 11:35:00

## 2013-08-04 NOTE — Interval H&P Note (Signed)
History and Physical Interval Note:  08/04/2013 9:18 AM  Gary [name]  has presented today for surgery, with the diagnosis of carotid stenosis, pvd  The various methods of treatment have been discussed with the patient and family. After consideration of risks, benefits and other options for treatment, the patient has consented to  Procedure(s): LOWER EXTREMITY ANGIOGRAM (N/A) CAROTID ANGIOGRAM (N/A) as a surgical intervention .  The patient's history has been reviewed, patient examined, no change in status, stable for surgery.  I have reviewed the patient's chart and labs.  Questions were answered to the patient's satisfaction.     Runell Gess

## 2013-08-04 NOTE — H&P (View-Only) (Signed)
07/17/2013 Gary [name]   [birth year]  [id number]  Primary Physicia Nicki Reaper, NP Primary Cardiologist: Dr Allyson Sabal  HPI:  Pt is a 78 y.o. male with a history of tobacco abuse, but quit smoking almost 16 years ago, hypertension, EtOH abuse, COPD, PAD, dyslipidemia, status post LFPBPG 2004 by Dr Arbie Cookey. His last peripheral angiogram was September 2012 which showed a patent left fem-pop, 70% to 80% tandem SFA stenosis. 2D echo in July 2014 revealed an EF of 55-60%, grade one diastolic dysfunction and peak PA pressures of . The pt presented with suspected STEMI in 2013 but had non obstructive CAD at cath.  Doppler studies performed in our office 1/27//15 revealed an occluded right SFA which is a new finding and high-grade disease in his left common femoral and popliteal artery with a patent femoropopliteal bypass graft. He does appear to have a proximal anastomotic lesion. Carotid dopplers done today show severe RICA disease 70-99%. He has not had symptoms of stroke or TIA. He had some Rt leg pain but its not clear this was claudication.               The pt was seen by Dr Allyson Sabal and myself. Dr Allyson Sabal feels we should proceed with lower extremity and carotid dopplers.     Current Outpatient Prescriptions  Medication Sig Dispense Refill  . albuterol (PROVENTIL HFA;VENTOLIN HFA) 108 (90 BASE) MCG/ACT inhaler Inhale 2 puffs into the lungs every 4 (four) hours as needed for wheezing.  1 Inhaler  12  . amLODipine (NORVASC) 5 MG tablet Take 1 tablet (5 mg total) by mouth daily.  30 tablet  5  . aspirin 81 MG tablet Take 81 mg by mouth daily.      Marland Kitchen atorvastatin (LIPITOR) 10 MG tablet Take 1 tablet (10 mg total) by mouth daily.  30 tablet  10  . chlorthalidone (HYGROTON) 25 MG tablet Take 1 tablet (25 mg total) by mouth daily.  30 tablet  5  . Choline Fenofibrate (FENOFIBRIC ACID) 135 MG CPDR TAKE 1 CAPSULE DAILY.  30 capsule  11  . co-enzyme Q-10 30 MG capsule Take 30 mg by mouth daily.      .  folic acid (FOLVITE) 1 MG tablet TAKE 1 TABLET DAILY.  30 tablet  11  . hydrALAZINE (APRESOLINE) 50 MG tablet Take 1.5 tablets (75 mg total) by mouth 3 (three) times daily.  135 tablet  11  . loratadine (CLARITIN) 10 MG tablet Take 10 mg by mouth daily as needed.       Marland Kitchen losartan (COZAAR) 100 MG tablet Take 1 tablet (100 mg total) by mouth daily.  30 tablet  11  . metoprolol succinate (TOPROL-XL) 25 MG 24 hr tablet Take 25 mg by mouth daily.      . pantoprazole (PROTONIX) 40 MG tablet Take 30- 60 min before your first and last meals of the day  60 tablet  3  . pseudoephedrine (SUDAFED) 60 MG tablet Take 60 mg by mouth every 4 (four) hours as needed for congestion.      . vitamin B-12 (CYANOCOBALAMIN) 100 MCG tablet Take 100 mcg by mouth daily.       No current facility-administered medications for this visit.    Allergies  Allergen Reactions  . Contrast Media [Iodinated Diagnostic Agents] Itching  . Diovan [Valsartan] Swelling    angioedema  . Neosporin [Neomycin-Bacitracin Zn-Polymyx] Hives  . Neomycin Hives    History   Social History  . Marital  Status: Divorced    Spouse Name: N/A    Number of Children: 3  . Years of Education: N/A   Occupational History  . welder, pipe fitter-Retired    Social History Main Topics  . Smoking status: Former Smoker -- 1.00 packs/day for 50 years    Types: Cigarettes    Quit date: 03/03/1997  . Smokeless tobacco: Former NeurosurgeonUser    Quit date: 04/27/1998  . Alcohol Use: 1.2 oz/week    2 Cans of beer per week  . Drug Use: No  . Sexual Activity: No   Other Topics Concern  . Not on file   Social History Narrative  . No narrative on file     Review of Systems: General: negative for chills, fever, night sweats or weight changes.  Cardiovascular: negative for chest pain, dyspnea on exertion, edema, orthopnea, palpitations, paroxysmal nocturnal dyspnea or shortness of breath Dermatological: negative for rash Respiratory: negative for cough  or wheezing Urologic: negative for hematuria Abdominal: negative for nausea, vomiting, diarrhea, bright red blood per rectum, melena, or hematemesis Neurologic: negative for visual changes, syncope, or dizziness All other systems reviewed and are otherwise negative except as noted above.    Blood pressure 164/58, pulse 78, height 5\' 7"  (1.702 m), weight 117 lb (53.071 kg).  General appearance: alert, cooperative, cachectic and no distress Neck: no JVD, supple, symmetrical, trachea midline and bilat carotid bruits Rt > Lt Lungs: clear to auscultation bilaterally and decreased breath sounds Heart: regular rate and rhythm Abdomen: soft, non-tender; bowel sounds normal; no masses,  no organomegaly Extremities: no edema or open wounds Pulses: diminnished bilat Skin: pale. cool, dry Neurologic: Grossly normal   ASSESSMENT AND PLAN:   Carotid artery disease RICA 70-99% by dopplers  PVD, LFBPG 2004, dopplers show progression of disease bliat Dopplers suggest progression of disease bilat  HTN  Controlled  CAD- non obstructive CAD by cath 04/19/11 No chest pain    PLAN  Reviewed with Dr Allyson SabalBerry, plan LE and carotid angiogram  [facility] HospitalKILROY,Maribell Demeo KPA-C 07/17/2013 5:25 PM

## 2013-08-04 NOTE — CV Procedure (Signed)
Gary [name] is a 78 y.o. male    [id number] LOCATION:  FACILITY: [facility]  PHYSICIAN: Nanetta BattyJonathan Berry, M.D. 16-Sep-1935   DATE OF PROCEDURE:  08/04/2013  DATE OF DISCHARGE:     PV Angiogram/Intervention    History obtained from chart review.Pt is a 78 y.o. male with a history of tobacco abuse, but quit smoking almost 16 years ago, hypertension, EtOH abuse, COPD, PAD, dyslipidemia, status post LFPBPG 2004 by Dr Arbie CookeyEarly. His last peripheral angiogram was September 2012 which showed a patent left fem-pop, 70% to 80% tandem SFA stenosis. 2D echo in July 2014 revealed an EF of 55-60%, grade one diastolic dysfunction and peak PA pressures of 35mmHg. The pt presented with suspected STEMI in 2013 but had non obstructive CAD at cath. Doppler studies performed in our office 1/27//15 revealed an occluded right SFA which is a new finding and high-grade disease in his left common femoral and popliteal artery with a patent femoropopliteal bypass graft. He does appear to have a proximal anastomotic lesion. Carotid dopplers done today show severe RICA disease 70-99%. He has not had symptoms of stroke or TIA. He had some Rt leg pain but its not clear this was claudication.  The patient presents for carotid angiography as well as lower cineangiography runoff.    PROCEDURE DESCRIPTION:   The patient was brought to the second floor [place] [facility] cath lab in the postabsorptive state. He was not premedicated . His right groinwas prepped and shaved in usual sterile fashion. Xylocaine 1% was used for local anesthesia. A 5 French sheath was inserted into the right common femoral artery using standard Seldinger technique. I was initially unable to access the right common femoral artery because of heavy calcification. I did do a femoral angiogram for this Seldinger needle revealing a large callus calcified plaque which prevented passage of the Versicore  wire. I therefore struck above the plaque and placed a 5  French sheath in the common femoral artery.The patient received 3000 units  of heparin  intravenously.  A 5 French pigtail catheter, JB 1 catheter and crossover catheters were used for aortic arch angiography, right and left carotid angiography, abdominal aortography, bilateral iliac angiography with bifemoral runoff. Visipaque was used for the entirety of the case. Retrograde aortic pressure was monitored during the case.    HEMODYNAMICS:    AO SYSTOLIC/AO DIASTOLIC: 235/51   Angiographic Data:   1: Aortic arch angiography-this was a type 3 arch. There was diffuse aortic arch calcifications as well as calcification of the origins of the arch branch vessels.  2: Right carotid artery-there is calcification in the right innominate artery. I selectively cannulated this but did not gain access to the right common carotid artery because of fear of embolization. There was a 95% calcified plaque at the origin of the right internal carotid artery.  3: Left carotid artery-there was a 75-80% calcified proximal left internal carotid artery stenosis  4: Left subclavian artery-50-70% proximal calcified left subclavian artery stenosis  5: Abdominal aortogram-40% proximal right renal artery stenosis, fluoroscopically calcified infrarenal abdominal aorta  6: Left lower extremity-95% calcified eccentric exophytic plaque left common femoral artery just above previous left femoropopliteal bypass graft. Proximal and distal anastomoses were intact. There was three-vessel runoff with a diffusely diseased distal posterior tibial artery  7:  Right lower extremity-80% distal right common iliac artery stenosis the calcified plaque. 95% calcified eccentric plaque right common femoral artery. Right SFA was occluded at its origin with reconstitution in the adductor canal  by profunda femoris collaterals. This was jeopardized by the common femoral plaque. There was two-vessel runoff with an occluded posterior  tibial  IMPRESSION:Gary [name] has high grade calcified right much greater than left internal carotid artery stenosis. He has a high-grade calcified plaque in his left common femoral artery jeopardizing a previously placed femoropopliteal bypass graft. This will need to be endarterectomized. He will also need to be evaluated for carotid endarterectomy. 3000 units of heparin was administered intravenously because the sheath in the right common femoral artery was subocclusive. The sheath will be removed and pressure held on the groin to achieve hemostasis. Patient will be gently hydrated, discharge him as an outpatient. I will see him back in the office for followup.   Runell Gess MD, [facility] 08/04/2013 10:13 AM

## 2013-09-08 ENCOUNTER — Other Ambulatory Visit: Payer: Self-pay | Admitting: *Deleted

## 2013-09-08 MED ORDER — METOPROLOL SUCCINATE ER 25 MG PO TB24: 25.0000 mg | ORAL_TABLET | Freq: Every day | ORAL | Status: DC

## 2013-09-15 ENCOUNTER — Ambulatory Visit (INDEPENDENT_AMBULATORY_CARE_PROVIDER_SITE_OTHER): Payer: Medicare Other | Admitting: Cardiovascular Disease

## 2013-09-15 ENCOUNTER — Encounter: Payer: Self-pay | Admitting: Cardiovascular Disease

## 2013-09-15 VITALS — BP 151/66 | HR 62 | Ht 67.0 in | Wt 113.2 lb

## 2013-09-15 DIAGNOSIS — I739 Peripheral vascular disease, unspecified: Secondary | ICD-10-CM | POA: Diagnosis not present

## 2013-09-15 DIAGNOSIS — I779 Disorder of arteries and arterioles, unspecified: Secondary | ICD-10-CM

## 2013-09-15 DIAGNOSIS — I10 Essential (primary) hypertension: Secondary | ICD-10-CM

## 2013-09-15 DIAGNOSIS — I701 Atherosclerosis of renal artery: Secondary | ICD-10-CM | POA: Diagnosis not present

## 2013-09-15 DIAGNOSIS — E785 Hyperlipidemia, unspecified: Secondary | ICD-10-CM | POA: Diagnosis not present

## 2013-09-15 NOTE — Progress Notes (Signed)
09/15/2013 Gary [name]   22-Jul-1935  [id number]  Primary Physician Gary [name] Primary Cardiologist: Gary GessJonathan J. Joie Reamer MD Gary [name]   HPI:  Gary [name] is a 78 y.o. male with a history of tobacco abuse, but quit smoking almost 16 years ago, hypertension, EtOH abuse, COPD, PAD, dyslipidemia, status post LFPBG by Dr. Arbie CookeyEarly remotely. His last peripheral angiogram was September 2012 which showed a patent left fem-pop, 70% to 80% tandem SFA stenosis. He has increased renal velocities on the right and left on past renal Dopplers. He had a renal angiogram on 06/25/12 which revealed widely patent renal arteries. 2D echo in July 2014 revealed an EF of 55-60%, grade one diastolic dysfunction and peak PA pressures of 35mmHg. He was seen by Gary BienenstockBrian Hager PA-C 03/04/13. He denies chest pain or shortness of breath. He has had 20-30 pounds of unexplained weight loss which has been worked up by his primary care physician. He complains of being weak and somewhat off balance and walks with the aid of a walker. Recent lower extremity Doppler studies performed in our office 1/27//15 revealed an occluded right SFA which is a new finding and high-grade disease in his left common femoral and popliteal artery with a patent femoropopliteal bypass graft. He does appear to have a proximal anastomotic lesion.carotid Dopplers likewise showed high-grade bilateral right greater than left internal carotid artery stenosis. He underwent angiography by myself on 08/04/13 revealing a type III aortic arch, calcified arch vessels with a 95% proximal right internal carotid artery stenosis and 75-80% left. He had a patent left femoropopliteal bypass graft with 95% calcified bilateral common femoral artery stenoses and an occluded right SFA. He has right greater than lower extremity lifestyle limiting claudication.    Current Outpatient Prescriptions  Medication Sig Dispense Refill  . albuterol (PROVENTIL  HFA;VENTOLIN HFA) 108 (90 BASE) MCG/ACT inhaler Inhale 2 puffs into the lungs every 4 (four) hours as needed for wheezing.  1 Inhaler  12  . amLODipine (NORVASC) 5 MG tablet Take 1 tablet (5 mg total) by mouth daily.  30 tablet  5  . aspirin EC 81 MG tablet Take 81 mg by mouth at bedtime.      Marland Kitchen. atorvastatin (LIPITOR) 10 MG tablet Take 1 tablet (10 mg total) by mouth daily.  30 tablet  10  . chlorthalidone (HYGROTON) 25 MG tablet Take 1 tablet (25 mg total) by mouth daily.  30 tablet  5  . Choline Fenofibrate 135 MG capsule Take 135 mg by mouth daily.      Marland Kitchen. co-enzyme Q-10 30 MG capsule Take 30 mg by mouth daily.      . folic acid (FOLVITE) 1 MG tablet Take 1 mg by mouth daily.      . hydrALAZINE (APRESOLINE) 50 MG tablet Take 1.5 tablets (75 mg total) by mouth 3 (three) times daily.  135 tablet  11  . losartan (COZAAR) 100 MG tablet Take 1 tablet (100 mg total) by mouth daily.  30 tablet  11  . metoprolol succinate (TOPROL-XL) 25 MG 24 hr tablet Take 1 tablet (25 mg total) by mouth daily.  30 tablet  9  . pantoprazole (PROTONIX) 40 MG tablet Take 40 mg by mouth 2 (two) times daily before a meal.      . pseudoephedrine (SUDAFED) 60 MG tablet Take 60 mg by mouth every 4 (four) hours as needed for congestion.      . vitamin B-12 (CYANOCOBALAMIN) 100 MCG tablet Take  100 mcg by mouth daily.       No current facility-administered medications for this visit.    Allergies  Allergen Reactions  . Contrast Media [Iodinated Diagnostic Agents] Itching  . Diovan [Valsartan] Swelling    angioedema  . Neosporin [Neomycin-Bacitracin Zn-Polymyx] Hives  . Neomycin Hives    History   Social History  . Marital Status: Divorced    Spouse Name: N/A    Number of Children: 3  . Years of Education: N/A   Occupational History  . welder, pipe fitter-Retired    Social History Main Topics  . Smoking status: Former Smoker -- 1.00 packs/day for 50 years    Types: Cigarettes    Quit date: 03/03/1997  .  Smokeless tobacco: Former Neurosurgeon    Quit date: 04/27/1998  . Alcohol Use: 1.2 oz/week    2 Cans of beer per week  . Drug Use: No  . Sexual Activity: No   Other Topics Concern  . Not on file   Social History Narrative  . No narrative on file     Review of Systems: General: negative for chills, fever, night sweats or weight changes.  Cardiovascular: negative for chest pain, dyspnea on exertion, edema, orthopnea, palpitations, paroxysmal nocturnal dyspnea or shortness of breath Dermatological: negative for rash Respiratory: negative for cough or wheezing Urologic: negative for hematuria Abdominal: negative for nausea, vomiting, diarrhea, bright red blood per rectum, melena, or hematemesis Neurologic: negative for visual changes, syncope, or dizziness All other systems reviewed and are otherwise negative except as noted above.    Blood pressure 151/66, pulse 62, height 5\' 7"  (1.702 m), weight 113 lb 3.2 oz (51.347 kg).  General appearance: alert and no distress Neck: no adenopathy, no JVD, supple, symmetrical, trachea midline, thyroid not enlarged, symmetric, no tenderness/mass/nodules and bilateral carotid bruits right louder than left Lungs: clear to auscultation bilaterally Heart: 2/6 outflow tract murmur Extremities: extremities normal, atraumatic, no cyanosis or edema  EKG not performed today  ASSESSMENT AND PLAN:   PVD, LFBPG 2004, dopplers show progression of disease bliat History of remote left femoropopliteal bypass grafting in 2004 by Dr. Arbie [name]. I performed a peripheral angiography on him 07/15/13 revealing an 80% right common iliac artery stenosis, 95 is present bilateral calcified common femoral artery stenosis, occluded SFAs bilaterally with a patent left femoropopliteal bypass graft. He is symptomatically the right than the left. He would benefit from undergoing bilateral common femoral endarterectomies and patch angioplasties.  HTN  Controlled on current  medications  Dyslipidemia On statin therapy followed by his PCP  Carotid artery disease Status post angiography performed 6/ /29/15 revealing a type III arch, calcified thoracic aorta and arch vessels, 95% right internal carotid artery stenosis that was calcified and 70-80% left. I am referring him to Dr. Myra Gianotti for consideration of endarterectomy.      Gary Gess MD FACP,FACC,FAHA, [facility] 09/15/2013 9:04 AM

## 2013-09-15 NOTE — Assessment & Plan Note (Signed)
History of remote left femoropopliteal bypass grafting in 2004 by Dr. Arbie Cookey. I performed a peripheral angiography on him 07/15/13 revealing an 80% right common iliac artery stenosis, 95 is present bilateral calcified common femoral artery stenosis, occluded SFAs bilaterally with a patent left femoropopliteal bypass graft. He is symptomatically the right than the left. He would benefit from undergoing bilateral common femoral endarterectomies and patch angioplasties.

## 2013-09-15 NOTE — Assessment & Plan Note (Signed)
On statin therapy followed by his PCP 

## 2013-09-15 NOTE — Assessment & Plan Note (Signed)
Controlled on current medications 

## 2013-09-15 NOTE — Assessment & Plan Note (Signed)
Status post angiography performed 6/ /29/15 revealing a type III arch, calcified thoracic aorta and arch vessels, 95% right internal carotid artery stenosis that was calcified and 70-80% left. I am referring him to Dr. Myra Gianotti for consideration of endarterectomy.

## 2013-09-15 NOTE — Patient Instructions (Signed)
  We will see you back in follow up in 3 months with Dr Allyson Sabal  Dr Allyson Sabal has referred you to Dr Myra Gianotti to evaluate your carotid artery disease and peripheral artery disease.

## 2013-10-07 ENCOUNTER — Encounter: Payer: Medicare Other | Admitting: Vascular Surgery

## 2013-10-09 ENCOUNTER — Telehealth (HOSPITAL_COMMUNITY): Payer: Self-pay | Admitting: *Deleted

## 2013-10-10 ENCOUNTER — Encounter: Payer: Self-pay | Admitting: Vascular Surgery

## 2013-10-14 ENCOUNTER — Encounter: Payer: Self-pay | Admitting: Vascular Surgery

## 2013-10-14 ENCOUNTER — Ambulatory Visit (INDEPENDENT_AMBULATORY_CARE_PROVIDER_SITE_OTHER): Payer: Medicare Other | Admitting: Vascular Surgery

## 2013-10-14 VITALS — BP 181/67 | HR 65 | Resp 16 | Ht 68.0 in | Wt 118.0 lb

## 2013-10-14 DIAGNOSIS — I70219 Atherosclerosis of native arteries of extremities with intermittent claudication, unspecified extremity: Secondary | ICD-10-CM

## 2013-10-14 DIAGNOSIS — M79609 Pain in unspecified limb: Secondary | ICD-10-CM

## 2013-10-14 DIAGNOSIS — M79604 Pain in right leg: Secondary | ICD-10-CM

## 2013-10-14 DIAGNOSIS — I701 Atherosclerosis of renal artery: Secondary | ICD-10-CM

## 2013-10-14 NOTE — Progress Notes (Signed)
Patient name: Gary [name] MRN: [id number] DOB: December 15, 1935 Sex: male   Referred by: Gary [name]  Reason for referral:  Chief Complaint  Patient presents with  . New Evaluation    Pt. ref by  Dr. Allyson [name]  C/O Right leg pain- several months.  Pt has loss about 40 lbs and does not know why.    HISTORY OF PRESENT ILLNESS: Patient presents today for evaluation of diffuse peripheral vascular occlusive disease. He is well known to me from a prior left femoral-popliteal bypass in 2003. He is at the no occlusions of his graft. He recently underwent arteriography by Dr. Allyson [name] and I have this for evaluation. He had a carotid arteriogram and also lower extremity arteriogram. He was found to have high-grade, critical right carotid stenosis and moderate to severe left carotid stenosis. Runoff views revealed right superficial artery occlusion with extensive plaque is comfortable artery. Widely patent left femoral-popliteal bypass with some stenosis and irregularity with plaque and this left common femoral artery. He is here today with his daughter Gary [name]. His main concern is a 40 pound weight loss over the last 6 months. He has had a workup to include upper GI endoscopy. Has not had lower GI endoscopy 2 concern about being able to tolerate the prep.  He does have claudication symptoms in his right leg which are somewhat limiting to him. Fortunately has had no strokes or transient ischemic attack.  He does have a long history of currently has alcohol abuse.  Past Medical History  Diagnosis Date  . 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 Hypertension   . PVD (peripheral vascular disease) 04/27/2011    normal renal & abdominal aortography; s/p Left Fem-Pop BPG 2004.  Marland Kitchen Dyslipidemia   . Coronary artery disease 04/27/2011    Low risk Myoview in 2010:Non-obstructive disase, except for small OMB 95% ostial lesion  . H/O ETOH abuse   . GERD  (gastroesophageal reflux disease)   . Alcohol abuse   . Carotid artery disease     Past Surgical History  Procedure Laterality Date  . Foot surgery    . Nasal sinus surgery    . Femoral-popliteal bypass graft Left 11/05/2002    Dr. Josephina [name]  . Transthoracic echocardiogram  08/14/2012    EF 55-60%, mod LVH, grade 1 diastolic dysfunction; vent septum thickness mod increased; calcified MV annulus & mild MR; RV systolic pressure increased - mild pulm HTN; PA peak pressure  . Nm myocar perf wall motion  01/2009    dipyridamole; normal pattern of perfusion in all regions; post-stress EF 75%; normal study, low risk   . Cardiac catheterization  04/27/2011    normal L main & LAD; L Cfx with minor irregularities, small marginal branch with 95% osital stenosis, RCA is nondominant & normal, LVEF 60% (Gary [name])  . Lower extremity arterial doppler  08/2012    R CFA & Profunda Femoral - diffuse irregular mixed density plaque; R SFA distal 70-99% diameter reduction; R PTA appears occluded; L CFA distal/Prox Anastomosis 70-99% diameter reduction; L Fem-Pop BPG open & patent; bilat TBBIs with inadequate perfusion for healing   . Renal doppler  05/2012    SMA - elevated velocities >60% diameter reduction; bilat renal arteries with 60-99% diameter reduction; R & L kidneys are normal in size & symmetrical  . Carotid doppler  01/2010    R subclavian 50-69% diameter reduction, R prox ICA  50-69% diameter reduction, L prox ICA 50-69% diameter reduction  . Renal angiogram  06/25/2012    normal renal arteries  . Lower extremity angiogram  10/13/2010    patent left fem-pop bypass graft with moderate calcified prox disease in distal common femoral just prox to graft anastomosis, mod high-grade segmental mid and distal R SFA disease, 2-3 vessel runoff with patent diffusely disease posterior tibilalis bilaterally (Gary [name])    History   Social History  . Marital Status: Divorced    Spouse Name: N/A     Number of Children: 3  . Years of Education: N/A   Occupational History  . welder, pipe fitter-Retired    Social History Main Topics  . Smoking status: Former Smoker -- 1.00 packs/day for 50 years    Types: Cigarettes    Quit date: 03/03/1997  . Smokeless tobacco: Former Neurosurgeon    Quit date: 04/27/1998  . Alcohol Use: 1.2 oz/week    2 Cans of beer per week  . Drug Use: No  . Sexual Activity: No   Other Topics Concern  . Not on file   Social History Narrative  . No narrative on file    Family History  Problem Relation Age of Onset  . CAD Brother     also stroke  . Hypertension Brother   . Diabetes Brother   . Hypertension Father     also stroke  . Stroke Father   . Hypertension Brother      X 2  . Kidney cancer Mother   . Cancer Mother     Renal  . Cancer Sister     Lung  . COPD Sister   . Hypertension Sister     Allergies as of 10/14/2013 - Review Complete 10/14/2013  Allergen Reaction Noted  . Contrast media [iodinated diagnostic agents] Itching 04/27/2011  . Diovan [valsartan] Swelling 03/03/2013  . Neosporin [neomycin-bacitracin zn-polymyx] Hives 05/05/2011  . Neomycin Hives 05/05/2011    Current Outpatient Prescriptions on File Prior to Visit  Medication Sig Dispense Refill  . albuterol (PROVENTIL HFA;VENTOLIN HFA) 108 (90 BASE) MCG/ACT inhaler Inhale 2 puffs into the lungs every 4 (four) hours as needed for wheezing.  1 Inhaler  12  . amLODipine (NORVASC) 5 MG tablet Take 1 tablet (5 mg total) by mouth daily.  30 tablet  5  . aspirin EC 81 MG tablet Take 81 mg by mouth at bedtime.      Marland Kitchen atorvastatin (LIPITOR) 10 MG tablet Take 1 tablet (10 mg total) by mouth daily.  30 tablet  10  . chlorthalidone (HYGROTON) 25 MG tablet Take 1 tablet (25 mg total) by mouth daily.  30 tablet  5  . Choline Fenofibrate 135 MG capsule Take 135 mg by mouth daily.      Marland Kitchen co-enzyme Q-10 30 MG capsule Take 30 mg by mouth daily.      . folic acid (FOLVITE) 1 MG tablet Take 1  mg by mouth daily.      . hydrALAZINE (APRESOLINE) 50 MG tablet Take 1.5 tablets (75 mg total) by mouth 3 (three) times daily.  135 tablet  11  . losartan (COZAAR) 100 MG tablet Take 1 tablet (100 mg total) by mouth daily.  30 tablet  11  . metoprolol succinate (TOPROL-XL) 25 MG 24 hr tablet Take 1 tablet (25 mg total) by mouth daily.  30 tablet  9  . pantoprazole (PROTONIX) 40 MG tablet Take 40 mg by mouth 2 (two) times daily  before a meal.      . pseudoephedrine (SUDAFED) 60 MG tablet Take 60 mg by mouth every 4 (four) hours as needed for congestion.      . vitamin B-12 (CYANOCOBALAMIN) 100 MCG tablet Take 100 mcg by mouth daily.       No current facility-administered medications on file prior to visit.     REVIEW OF SYSTEMS:  Positives indicated with an "X"  CARDIOVASCULAR:   chest pain    chest pressure    palpitations    orthopnea    dyspnea on exertion   [x ] claudication    rest pain   [ x] DVT    phlebitis PULMONARY:    productive cough    asthma    wheezing NEUROLOGIC:    weakness   paresthesias   aphasia   amaurosis   dizziness HEMATOLOGIC:    bleeding problems    clotting disorders MUSCULOSKELETAL:   joint pain    joint swelling GASTROINTESTINAL:   blood in stool    hematemesis GENITOURINARY:    dysuria    hematuria PSYCHIATRIC:   history of major depression INTEGUMENTARY:   rashes   ulcers CONSTITUTIONAL:   fever    chills  PHYSICAL EXAMINATION:  General: The patient is a well-nourished male, in no acute distress. He is quite thin and frail compared progress with him in the past Vital signs are BP 181/67  Pulse 65  Resp 16  Ht  (1.727 m)  Wt 118 lb (53.524 kg)  BMI 17.95 kg/m2  SpO2 100% Pulmonary: There is a good air exchange bilaterally  Abdomen: Soft and non-tender with hyperactive pitch bowel sounds. No abdominal bruits are present Musculoskeletal: There are no major  deformities.  There is no significant extremity pain. Neurologic: No focal weakness or paresthesias are detected, Skin: There are no ulcer or rashes noted. Psychiatric: The patient has normal affect. Cardiovascular: There is a regular rate and rhythm without significant murmur appreciated. Carotid or vertebral bruits bilaterally He has 2+ femoral pulses bilaterally. He has 2+ left popliteal and posterior tibial pulse. Has absent right popliteal and distal pulses   Impression and Plan:  Diffuse peripheral vascular occlusive disease. I did review his films and he does have a critical calcified stenosis of his internal carotid artery on the right. He does have extensive lower from a peripheral occlusive disease in the right leg as well. My biggest concern is of his extensive ongoing weight loss. He does report pain in his abdomen with eating. He did have a CT scan in May for evaluation of this and was told. No evidence of cancer. On reviewing his films, he does have extensive calcification of his aorta at the level of the mesenteric vessels. He appears to have severe stenosis in these.  His recent aortogram at the catheter just above the level of the renals. There is no visualization of the SMA or celiac artery but this may be due to the catheter being below these vessels.  I discussed this at length with the patient and his daughter present. I will CT scan with the radiologist tomorrow to determine if manipulating the images we'll give her some answered regarding possible mesenteric ischemia. I felt this may be the most likely cause of his weight loss. I certainly would not recommend any treatment of  his lower from claudication since he is not limb threatening by this. Certainly also would recommend deferring treatment of his carotid disease even though this does show critical stenosis until we determine if he is having severe mesenteric ischemia. We'll call his daughter as requested by telephone once I  have reviewed his films tomorrow    [facility] Office: [phone]

## 2013-10-16 ENCOUNTER — Telehealth: Payer: Self-pay | Admitting: Vascular Surgery

## 2013-10-16 NOTE — Telephone Encounter (Signed)
I called Ms. Jamesetta Orleans, Mr. Lindwood Qua daughter. Explained to have reviewed his CT scan from April 2015 with the radiologist. They agree that he does apparently have severe stenosis in his celiac and mesenteric artery with either complete occlusion or subtotal occlusion of both. For quite comfortable with this was the cause of his 40 pound weight loss and inability to eat. I have recommended arteriography for further evaluation. I explained to visit and his current frail condition, but he would have a very high morbidity and mortality associated treated with open aortic revascularization and mesenteric revascularization. Did explain the possible opportunity for stenting of his per mesenteric artery and/or celiac artery depending on his anatomy. She stated that she would discuss this with the patient. Also stated that he likely would not consent to open surgery but might consider potential intervention for angioplasty. She or Mr. Wallie Char will contact us if they wish to proceed with arteriogram. Explained this would be through a brachial approach to get antegrade access to the superior mesenteric artery

## 2013-10-18 ENCOUNTER — Other Ambulatory Visit: Payer: Self-pay | Admitting: Cardiovascular Disease

## 2013-10-20 NOTE — Telephone Encounter (Signed)
Rx refill sent to patient pharmacy   

## 2013-10-23 ENCOUNTER — Other Ambulatory Visit: Payer: Self-pay | Admitting: Internal Medicine

## 2013-10-24 NOTE — Telephone Encounter (Signed)
Medication never filled by you--last OV was 05/01/13 with you--please advise

## 2013-11-01 ENCOUNTER — Other Ambulatory Visit: Payer: Self-pay | Admitting: Internal Medicine

## 2013-11-07 ENCOUNTER — Other Ambulatory Visit: Payer: Self-pay

## 2013-11-13 ENCOUNTER — Other Ambulatory Visit: Payer: Self-pay

## 2013-11-13 ENCOUNTER — Encounter (HOSPITAL_COMMUNITY): Payer: Self-pay | Admitting: Pharmacy Technician

## 2013-11-13 ENCOUNTER — Ambulatory Visit (INDEPENDENT_AMBULATORY_CARE_PROVIDER_SITE_OTHER): Payer: Medicare Other | Admitting: Vascular Surgery

## 2013-11-13 ENCOUNTER — Encounter: Payer: Self-pay | Admitting: Vascular Surgery

## 2013-11-13 VITALS — BP 165/64 | HR 56 | Resp 14 | Ht 68.0 in | Wt 120.0 lb

## 2013-11-13 DIAGNOSIS — M79604 Pain in right leg: Secondary | ICD-10-CM | POA: Diagnosis not present

## 2013-11-13 DIAGNOSIS — I70219 Atherosclerosis of native arteries of extremities with intermittent claudication, unspecified extremity: Secondary | ICD-10-CM

## 2013-11-13 DIAGNOSIS — I701 Atherosclerosis of renal artery: Secondary | ICD-10-CM | POA: Diagnosis not present

## 2013-11-13 DIAGNOSIS — I739 Peripheral vascular disease, unspecified: Secondary | ICD-10-CM | POA: Diagnosis not present

## 2013-11-13 NOTE — Progress Notes (Signed)
The patient's daughter called last night and spoke with Dr. Lawson. He has had progressive rest pain in his right foot. He was asked to come to our office this morning for further evaluation. He reports that over the past week to 10 days he has had the worsening rest pain in his right foot. This is constant and intolerable for him.  He has a very complex patient. This is well documented with last visit with him on 10/14/2013. He has critical right carotid stenosis which fortunately remains asymptomatic. He has critical right limb ischemia and also has severe chronic mesenteric ischemia with a greater than 40 pound weight loss over the last 6 months. We have scheduled him for mesenteric arteriogram.  On physical exam he does have tendon rubor. Does have some early skin breakdown on the right fifth toe. He has no right femoral or distal pulses. Continues to have a normal 2+ femoral pulse on the left.  Impression and plan: Very complex patient with progressive critical limb ischemia on the right which is becoming intolerable for him. Fortunately he does have a sensation intact and therefore does not require emergent surgery today. I have recommended that we proceed with right femoral endarterectomy and possible right femoropopliteal depending on how he is doing surgical standpoint. I feel that we would be able to relieve his rest pain with common femoral endarterectomy and patch alone. Also explained options of vein femoropopliteal versus prosthetic femoropopliteal which would be a quicker operation for him. He is a very frail and is at significant risk for surgery. Explained would proceed with mesenteric arteriogram once critical limb ischemia has been corrected. He is her today with his daughter who understands as well and we will plan for surgery on 11/17/2013 

## 2013-11-14 ENCOUNTER — Encounter (HOSPITAL_COMMUNITY): Payer: Self-pay | Admitting: *Deleted

## 2013-11-14 NOTE — Progress Notes (Signed)
Anesthesia Chart Review:  Patient is a 78 year old male scheduled for right femoral endarterectomy and right FPBG on 11/17/13 by Dr. Arbie Cookey.  Patient is scheduled to be a same day work-up. He was added on to the OR schedule just yesterday following an urgent visit with Dr. Arbie Cookey on 11/13/13 for progressive right foot rest pain.    History includes mesenteric ischemia with 40 lb weight loss over six months (severe stenosis of celiac and mesenteric artery) with mesenteric arteriogram planned following treatment of his critical limb ischemia, PAD s/p left FPBG '03, carotid occlusive disease (high grade RICAS, moderate-severe LICAS) with treatment deferred until he has completed his mesenteric ischemia evaluation, CAD, ETOH abuse, HTN, dyslipidemia, GERD, former smoker, COPD.  BMI is currently listed as 18. Due to his multiple co-morbidities, Dr. Arbie Cookey feels he is a significant risk for surgery. PCP is listed as Nicki Reaper, NP.  Pulmonologist is Dr. Sherene Sires. Cardiologist is Dr. Allyson Sabal who referred patient to Dr. Laurence Slate initially for carotid endarterectomy; however, Dr. Arbie Cookey felt progression critical limb ischemic symptoms and mesenteric ischemia would need to be addressed first.  EKG on 04/29/13 showed: NSR, LVH with repolarization abnormality (inferolateral ST/T wave abnormality), cannot rule out septal infarct (age undetermined). ST/T wave abnormality was present on 01/13/13 EKG as well.  Echo on 08/14/12 showed:  - Left ventricle: Wall thickness was increased in a pattern of moderate LVH. Systolic function was normal. The estimated ejection fraction was in the range of 55% to 60%. Doppler parameters are consistent with abnormal left ventricular relaxation (grade 1 diastolic dysfunction). - Ventricular septum: Thickness was moderately increased. - Mitral valve: Calcified annulus. Mild regurgitation. - Right ventricle: Systolic pressure was increased. - Pulmonary arteries: PA peak pressure: 60mm Hg  (S). Impressions: The right ventricular systolic pressure was increased consistent with mild pulmonary hypertension.  Cardiac cath on 04/27/11 showed:  1. Left main; normal  2. LAD; normal  3. Left circumflex; dominant with minor irregularities. There was a small marginal branch that had a 95% ostial stenosis of probably little clinical significance.  4. Right coronary artery; nondominant and normal  5. Left ventriculography; The overall LVEF estimated 60 % Without wall motion abnormalities  6. Supravalvular aortography-normal without dissection  7. Distal abdominal aortography: Normal renal arteries, normal infrarenal doubt aorta and iliac bifurcation  IMPRESSION:Mr. Runyan has essentially normal coronary arteries and normal left ventricular function. I suspect that his EKG changes were related to left-ventricular hypertrophy with repolarization changes. The etiology of his chest pain is noncardiac. (Dr. Allyson Sabal)    PV angiogram 08/04/13 showed:  1: Aortic arch angiography-this was a type 3 arch. There was diffuse aortic arch calcifications as well as calcification of the origins of the arch branch vessels.  2: Right carotid artery-there is calcification in the right innominate artery. I selectively cannulated this but did not gain access to the right common carotid artery because of fear of embolization. There was a 95% calcified plaque at the origin of the right internal carotid artery.  3: Left carotid artery-there was a 75-80% calcified proximal left internal carotid artery stenosis.  4: Left subclavian artery-50-70% proximal calcified left subclavian artery stenosis.  5: Abdominal aortogram-40% proximal right renal artery stenosis, fluoroscopically calcified infrarenal abdominal aorta . 6: Left lower extremity-95% calcified eccentric exophytic plaque left common femoral artery just above previous left femoropopliteal bypass graft. Proximal and distal anastomoses were intact. There was  three-vessel runoff with a diffusely diseased distal posterior tibial artery. 7: Right lower extremity-80% distal  right common iliac artery stenosis the calcified plaque. 95% calcified eccentric plaque right common femoral artery. Right SFA was occluded at its origin with reconstitution in the adductor canal by profunda femoris collaterals. This was jeopardized by the common femoral plaque. There was two-vessel runoff with an occluded posterior tibial.  IMPRESSION:Mr. Jeune has high grade calcified right much greater than left internal carotid artery stenosis. He has a high-grade calcified plaque in his left common femoral artery jeopardizing a previously placed femoropopliteal bypass graft. This will need to be endarterectomized. He will also need to be evaluated for carotid endarterectomy. (Dr. Allyson SabalBerry)  Renal artery duplex on 03/18/13 showed: > 60% diameter reduction SMA and celiac artery, 1-59% bilateral renal artery stenosis by velocities; however, renal/aorta ratio is suggestive of > 60% diameter reduction. Bilateral kidneys were normal in size, symmetrical in shape with no abnormal echogenicity or hydronephrosis noted.   CXR on 06/17/13 showed: COPD. Scarring versus atelectasis right lung base, minimal.   He will need labs on arrival the day of surgery.   Patient with multiple co-morbidities including chronic mesenteric ischemia with weight loss, critical right carotid artery stenosis, and now with critical RLE ischemia. Dr. Arbie CookeyEarly feels patient is at significant risk for surgery, but will require right femoral endarterectomy and possible right FPBG depending on how he is doing from a surgical standpoint. Fortunately, cardiac cath in 2013 showed essentially normal coronaries except for a small marginal branch which was felt to be of clinically insignificant. He had recent evaluation with Dr. Allyson SabalBerry. Would anticipate he could proceed if no acute changes and labs acceptable.  Definitive anesthesia plan on  the day of surgery.  Reviewed with anesthesiologist Dr. Michelle Piperssey.    Velna Ochsllison Alianah Lofton, PA-C [facility] Phone [phone] 11/14/2013 3:00 PM

## 2013-11-14 NOTE — Progress Notes (Signed)
11/14/13 1133  OBSTRUCTIVE SLEEP APNEA  Have you ever been diagnosed with sleep apnea through a sleep study? No  Do you snore loudly (loud enough to be heard through closed doors)?  0  Do you often feel tired, fatigued, or sleepy during the daytime? 1  Has anyone observed you stop breathing during your sleep? 0  Do you have, or are you being treated for high blood pressure? 1  BMI more than 35 kg/m2? 0  Age over 76 years old? 1  Gender: 1  Obstructive Sleep Apnea Score 4

## 2013-11-16 MED ORDER — SODIUM CHLORIDE 0.9 % IV SOLN: INTRAVENOUS | Status: DC

## 2013-11-16 MED ORDER — CEFUROXIME SODIUM 1.5 G IJ SOLR
1.5000 g | INTRAMUSCULAR | Status: AC
  Administered 2013-11-17: 1.5 g via INTRAVENOUS
  Filled 2013-11-16: qty 1.5

## 2013-11-17 ENCOUNTER — Inpatient Hospital Stay (HOSPITAL_COMMUNITY)
Admission: RE | Admit: 2013-11-17 | Discharge: 2013-11-25 | DRG: 271 | Disposition: A | Discharge status: Skilled Nursing Facility | Payer: Medicare Other | Source: Ambulatory Visit | Attending: Vascular Surgery | Admitting: Vascular Surgery

## 2013-11-17 ENCOUNTER — Encounter (HOSPITAL_COMMUNITY)
Admission: RE | Disposition: A | Discharge status: Skilled Nursing Facility | Payer: Self-pay | Source: Ambulatory Visit | Attending: Vascular Surgery

## 2013-11-17 ENCOUNTER — Inpatient Hospital Stay (HOSPITAL_COMMUNITY): Payer: Medicare Other | Admitting: Vascular Surgery

## 2013-11-17 ENCOUNTER — Encounter (HOSPITAL_COMMUNITY): Payer: Self-pay | Admitting: Certified Registered Nurse Anesthetist

## 2013-11-17 ENCOUNTER — Encounter (HOSPITAL_COMMUNITY): Payer: Medicare Other | Admitting: Vascular Surgery

## 2013-11-17 DIAGNOSIS — I2510 Atherosclerotic heart disease of native coronary artery without angina pectoris: Secondary | ICD-10-CM | POA: Diagnosis present

## 2013-11-17 DIAGNOSIS — Z79899 Other long term (current) drug therapy: Secondary | ICD-10-CM

## 2013-11-17 DIAGNOSIS — M79671 Pain in right foot: Secondary | ICD-10-CM | POA: Diagnosis not present

## 2013-11-17 DIAGNOSIS — K550 Acute vascular disorders of intestine: Secondary | ICD-10-CM | POA: Diagnosis not present

## 2013-11-17 DIAGNOSIS — I7092 Chronic total occlusion of artery of the extremities: Secondary | ICD-10-CM | POA: Diagnosis present

## 2013-11-17 DIAGNOSIS — I6521 Occlusion and stenosis of right carotid artery: Secondary | ICD-10-CM | POA: Diagnosis present

## 2013-11-17 DIAGNOSIS — I739 Peripheral vascular disease, unspecified: Secondary | ICD-10-CM | POA: Diagnosis not present

## 2013-11-17 DIAGNOSIS — I959 Hypotension, unspecified: Secondary | ICD-10-CM | POA: Diagnosis present

## 2013-11-17 DIAGNOSIS — E785 Hyperlipidemia, unspecified: Secondary | ICD-10-CM | POA: Diagnosis present

## 2013-11-17 DIAGNOSIS — Z87891 Personal history of nicotine dependence: Secondary | ICD-10-CM

## 2013-11-17 DIAGNOSIS — J449 Chronic obstructive pulmonary disease, unspecified: Secondary | ICD-10-CM | POA: Diagnosis present

## 2013-11-17 DIAGNOSIS — K219 Gastro-esophageal reflux disease without esophagitis: Secondary | ICD-10-CM | POA: Diagnosis present

## 2013-11-17 DIAGNOSIS — I70229 Atherosclerosis of native arteries of extremities with rest pain, unspecified extremity: Secondary | ICD-10-CM | POA: Diagnosis present

## 2013-11-17 DIAGNOSIS — I998 Other disorder of circulatory system: Secondary | ICD-10-CM | POA: Diagnosis not present

## 2013-11-17 DIAGNOSIS — R58 Hemorrhage, not elsewhere classified: Secondary | ICD-10-CM | POA: Diagnosis not present

## 2013-11-17 DIAGNOSIS — I70221 Atherosclerosis of native arteries of extremities with rest pain, right leg: Secondary | ICD-10-CM | POA: Diagnosis present

## 2013-11-17 DIAGNOSIS — K551 Chronic vascular disorders of intestine: Secondary | ICD-10-CM | POA: Diagnosis present

## 2013-11-17 DIAGNOSIS — I272 Other secondary pulmonary hypertension: Secondary | ICD-10-CM | POA: Diagnosis present

## 2013-11-17 DIAGNOSIS — I10 Essential (primary) hypertension: Secondary | ICD-10-CM | POA: Diagnosis present

## 2013-11-17 HISTORY — PX: FEMORAL-POPLITEAL BYPASS GRAFT: SHX937

## 2013-11-17 HISTORY — PX: ENDARTERECTOMY FEMORAL: SHX5804

## 2013-11-17 HISTORY — DX: Pneumonia, unspecified organism: J18.9

## 2013-11-17 LAB — COMPREHENSIVE METABOLIC PANEL
ALBUMIN: 3.4 g/dL — AB (ref 3.5–5.2)
ALK PHOS: 36 U/L — AB (ref 39–117)
ALT: 7 U/L (ref 0–53)
AST: 16 U/L (ref 0–37)
Anion gap: 15 (ref 5–15)
BUN: 15 mg/dL (ref 6–23)
CHLORIDE: 97 meq/L (ref 96–112)
CO2: 24 mEq/L (ref 19–32)
Calcium: 9.4 mg/dL (ref 8.4–10.5)
Creatinine, Ser: 0.66 mg/dL (ref 0.50–1.35)
GFR calc Af Amer: >90 mL/min (ref >90)
GFR calc non Af Amer: >90 mL/min (ref >90)
Glucose, Bld: 97 mg/dL (ref 70–99)
POTASSIUM: 3.6 meq/L — AB (ref 3.7–5.3)
SODIUM: 136 meq/L — AB (ref 137–147)
TOTAL PROTEIN: 6.6 g/dL (ref 6.0–8.3)
Total Bilirubin: 0.3 mg/dL (ref 0.3–1.2)

## 2013-11-17 LAB — ABO/RH: ABO/RH(D): AB POS

## 2013-11-17 LAB — APTT: aPTT: 36 seconds (ref 24–37)

## 2013-11-17 LAB — CBC
HCT: 38.4 % — ABNORMAL LOW (ref 39.0–52.0)
Hemoglobin: 13.1 g/dL (ref 13.0–17.0)
MCH: 30.5 pg (ref 26.0–34.0)
MCHC: 34.1 g/dL (ref 30.0–36.0)
MCV: 89.3 fL (ref 78.0–100.0)
Platelets: 267 10*3/uL (ref 150–400)
RBC: 4.3 MIL/uL (ref 4.22–5.81)
RDW: 14 % (ref 11.5–15.5)
WBC: 6.9 10*3/uL (ref 4.0–10.5)

## 2013-11-17 LAB — SURGICAL PCR SCREEN
MRSA, PCR: NEGATIVE
STAPHYLOCOCCUS AUREUS: NEGATIVE

## 2013-11-17 LAB — PROTIME-INR
INR: 0.99 (ref 0.00–1.49)
Prothrombin Time: 13.1 seconds (ref 11.6–15.2)

## 2013-11-17 LAB — TYPE AND SCREEN
ABO/RH(D): AB POS
ANTIBODY SCREEN: NEGATIVE

## 2013-11-17 SURGERY — ENDARTERECTOMY, FEMORAL
Anesthesia: General | Site: Leg Lower | Laterality: Right

## 2013-11-17 MED ORDER — NEOSTIGMINE METHYLSULFATE 10 MG/10ML IV SOLN
INTRAVENOUS | Status: AC
  Filled 2013-11-17: qty 1

## 2013-11-17 MED ORDER — PROPOFOL 10 MG/ML IV BOLUS
INTRAVENOUS | Status: AC
  Filled 2013-11-17: qty 20

## 2013-11-17 MED ORDER — FOLIC ACID 1 MG PO TABS
1.0000 mg | ORAL_TABLET | Freq: Every day | ORAL | Status: DC
  Administered 2013-11-17 – 2013-11-25 (×9): 1 mg via ORAL
  Filled 2013-11-17 (×9): qty 1

## 2013-11-17 MED ORDER — PHENYLEPHRINE HCL 10 MG/ML IJ SOLN
10.0000 mg | INTRAVENOUS | Status: DC | PRN
  Administered 2013-11-17: 10 ug/min via INTRAVENOUS

## 2013-11-17 MED ORDER — CHLORHEXIDINE GLUCONATE CLOTH 2 % EX PADS: 6.0000 | MEDICATED_PAD | Freq: Once | CUTANEOUS | Status: DC

## 2013-11-17 MED ORDER — OXYCODONE HCL 5 MG/5ML PO SOLN: 5.0000 mg | Freq: Once | ORAL | Status: DC | PRN

## 2013-11-17 MED ORDER — INFLUENZA VAC SPLIT QUAD 0.5 ML IM SUSY
0.5000 mL | PREFILLED_SYRINGE | INTRAMUSCULAR | Status: AC
  Administered 2013-11-18: 0.5 mL via INTRAMUSCULAR
  Filled 2013-11-17: qty 0.5

## 2013-11-17 MED ORDER — ENOXAPARIN SODIUM 40 MG/0.4ML ~~LOC~~ SOLN
40.0000 mg | SUBCUTANEOUS | Status: DC
  Administered 2013-11-18 – 2013-11-24 (×6): 40 mg via SUBCUTANEOUS
  Filled 2013-11-17 (×8): qty 0.4

## 2013-11-17 MED ORDER — HYDRALAZINE HCL 20 MG/ML IJ SOLN
10.0000 mg | INTRAMUSCULAR | Status: DC | PRN
  Administered 2013-11-21: 10 mg via INTRAVENOUS
  Filled 2013-11-17: qty 1

## 2013-11-17 MED ORDER — ACETAMINOPHEN 650 MG RE SUPP: 325.0000 mg | RECTAL | Status: DC | PRN

## 2013-11-17 MED ORDER — OXYCODONE HCL 5 MG PO TABS
5.0000 mg | ORAL_TABLET | ORAL | Status: DC | PRN
  Administered 2013-11-17: 5 mg via ORAL
  Administered 2013-11-18 (×2): 10 mg via ORAL
  Administered 2013-11-19 – 2013-11-20 (×2): 5 mg via ORAL
  Administered 2013-11-21: 10 mg via ORAL
  Administered 2013-11-22 – 2013-11-24 (×2): 5 mg via ORAL
  Filled 2013-11-17 (×3): qty 1
  Filled 2013-11-17: qty 2
  Filled 2013-11-17: qty 1
  Filled 2013-11-17 (×2): qty 2
  Filled 2013-11-17 (×2): qty 1

## 2013-11-17 MED ORDER — ALBUTEROL SULFATE HFA 108 (90 BASE) MCG/ACT IN AERS: 2.0000 | INHALATION_SPRAY | RESPIRATORY_TRACT | Status: DC | PRN

## 2013-11-17 MED ORDER — LACTATED RINGERS IV SOLN
INTRAVENOUS | Status: DC
  Administered 2013-11-17: 12:00:00 via INTRAVENOUS

## 2013-11-17 MED ORDER — HEPARIN SODIUM (PORCINE) 1000 UNIT/ML IJ SOLN
INTRAMUSCULAR | Status: AC
  Filled 2013-11-17: qty 2

## 2013-11-17 MED ORDER — ALBUTEROL SULFATE (2.5 MG/3ML) 0.083% IN NEBU: 2.5000 mg | INHALATION_SOLUTION | RESPIRATORY_TRACT | Status: DC | PRN

## 2013-11-17 MED ORDER — NEOSTIGMINE METHYLSULFATE 10 MG/10ML IV SOLN
INTRAVENOUS | Status: DC | PRN
  Administered 2013-11-17: 3 mg via INTRAVENOUS

## 2013-11-17 MED ORDER — ATORVASTATIN CALCIUM 10 MG PO TABS
10.0000 mg | ORAL_TABLET | Freq: Every day | ORAL | Status: DC
  Administered 2013-11-17 – 2013-11-25 (×9): 10 mg via ORAL
  Filled 2013-11-17 (×9): qty 1

## 2013-11-17 MED ORDER — LIDOCAINE HCL (CARDIAC) 20 MG/ML IV SOLN
INTRAVENOUS | Status: AC
  Filled 2013-11-17: qty 5

## 2013-11-17 MED ORDER — DEXAMETHASONE SODIUM PHOSPHATE 10 MG/ML IJ SOLN
INTRAMUSCULAR | Status: DC | PRN
  Administered 2013-11-17: 8 mg via INTRAVENOUS

## 2013-11-17 MED ORDER — DEXAMETHASONE SODIUM PHOSPHATE 4 MG/ML IJ SOLN
INTRAMUSCULAR | Status: AC
  Filled 2013-11-17: qty 2

## 2013-11-17 MED ORDER — OXYCODONE HCL 5 MG PO TABS: 5.0000 mg | ORAL_TABLET | Freq: Once | ORAL | Status: DC | PRN

## 2013-11-17 MED ORDER — PSEUDOEPHEDRINE HCL 60 MG PO TABS: 60.0000 mg | ORAL_TABLET | ORAL | Status: DC | PRN

## 2013-11-17 MED ORDER — FENOFIBRATE 160 MG PO TABS
160.0000 mg | ORAL_TABLET | Freq: Every day | ORAL | Status: DC
  Administered 2013-11-17 – 2013-11-25 (×9): 160 mg via ORAL
  Filled 2013-11-17 (×9): qty 1

## 2013-11-17 MED ORDER — POTASSIUM CHLORIDE IN NACL 20-0.9 MEQ/L-% IV SOLN
INTRAVENOUS | Status: DC
  Filled 2013-11-17 (×5): qty 1000

## 2013-11-17 MED ORDER — PANTOPRAZOLE SODIUM 40 MG PO TBEC
40.0000 mg | DELAYED_RELEASE_TABLET | Freq: Every day | ORAL | Status: DC
  Administered 2013-11-18 – 2013-11-25 (×8): 40 mg via ORAL
  Filled 2013-11-17 (×7): qty 1

## 2013-11-17 MED ORDER — CHLORTHALIDONE 25 MG PO TABS
25.0000 mg | ORAL_TABLET | Freq: Every day | ORAL | Status: DC
  Administered 2013-11-17 – 2013-11-25 (×9): 25 mg via ORAL
  Filled 2013-11-17 (×9): qty 1

## 2013-11-17 MED ORDER — ARTIFICIAL TEARS OP OINT
TOPICAL_OINTMENT | OPHTHALMIC | Status: AC
  Filled 2013-11-17: qty 3.5

## 2013-11-17 MED ORDER — GLYCOPYRROLATE 0.2 MG/ML IJ SOLN
INTRAMUSCULAR | Status: DC | PRN
  Administered 2013-11-17: 0.4 mg via INTRAVENOUS
  Administered 2013-11-17: 0.2 mg via INTRAVENOUS

## 2013-11-17 MED ORDER — ROCURONIUM BROMIDE 100 MG/10ML IV SOLN
INTRAVENOUS | Status: DC | PRN
  Administered 2013-11-17: 30 mg via INTRAVENOUS

## 2013-11-17 MED ORDER — VITAMIN B-12 100 MCG PO TABS
100.0000 ug | ORAL_TABLET | Freq: Every day | ORAL | Status: DC
  Administered 2013-11-17 – 2013-11-25 (×9): 100 ug via ORAL
  Filled 2013-11-17 (×9): qty 1

## 2013-11-17 MED ORDER — ONDANSETRON HCL 4 MG/2ML IJ SOLN
INTRAMUSCULAR | Status: DC | PRN
  Administered 2013-11-17: 4 mg via INTRAVENOUS

## 2013-11-17 MED ORDER — FENTANYL CITRATE 0.05 MG/ML IJ SOLN
INTRAMUSCULAR | Status: DC | PRN
Start: 2013-11-17 — End: 2013-11-17
  Administered 2013-11-17: 100 ug via INTRAVENOUS
  Administered 2013-11-17 (×2): 25 ug via INTRAVENOUS

## 2013-11-17 MED ORDER — PHENYLEPHRINE HCL 10 MG/ML IJ SOLN
INTRAMUSCULAR | Status: AC
  Filled 2013-11-17: qty 1

## 2013-11-17 MED ORDER — POTASSIUM CHLORIDE CRYS ER 20 MEQ PO TBCR: 20.0000 meq | EXTENDED_RELEASE_TABLET | Freq: Every day | ORAL | Status: DC | PRN

## 2013-11-17 MED ORDER — MORPHINE SULFATE 2 MG/ML IJ SOLN
2.0000 mg | INTRAMUSCULAR | Status: DC | PRN
  Administered 2013-11-18 – 2013-11-20 (×6): 2 mg via INTRAVENOUS
  Administered 2013-11-21 (×2): 4 mg via INTRAVENOUS
  Administered 2013-11-21: 01:00:00 2 mg via INTRAVENOUS
  Administered 2013-11-21: 4 mg via INTRAVENOUS
  Filled 2013-11-17 (×2): qty 1
  Filled 2013-11-17 (×2): qty 2
  Filled 2013-11-17 (×3): qty 1
  Filled 2013-11-17: qty 2
  Filled 2013-11-17 (×2): qty 1

## 2013-11-17 MED ORDER — 0.9 % SODIUM CHLORIDE (POUR BTL) OPTIME
TOPICAL | Status: DC | PRN
  Administered 2013-11-17: 2000 mL

## 2013-11-17 MED ORDER — MUPIROCIN 2 % EX OINT
1.0000 "application " | TOPICAL_OINTMENT | Freq: Once | CUTANEOUS | Status: AC
  Administered 2013-11-17: 1 via TOPICAL

## 2013-11-17 MED ORDER — MUPIROCIN 2 % EX OINT
TOPICAL_OINTMENT | CUTANEOUS | Status: AC
  Filled 2013-11-17: qty 22

## 2013-11-17 MED ORDER — PROPOFOL 10 MG/ML IV BOLUS
INTRAVENOUS | Status: DC | PRN
  Administered 2013-11-17: 100 mg via INTRAVENOUS

## 2013-11-17 MED ORDER — GLYCOPYRROLATE 0.2 MG/ML IJ SOLN
INTRAMUSCULAR | Status: AC
  Filled 2013-11-17: qty 1

## 2013-11-17 MED ORDER — ARTIFICIAL TEARS OP OINT
TOPICAL_OINTMENT | OPHTHALMIC | Status: DC | PRN
  Administered 2013-11-17: 1 via OPHTHALMIC

## 2013-11-17 MED ORDER — HEPARIN SODIUM (PORCINE) 1000 UNIT/ML IJ SOLN
INTRAMUSCULAR | Status: DC | PRN
  Administered 2013-11-17: 6000 [IU] via INTRAVENOUS

## 2013-11-17 MED ORDER — BISACODYL 10 MG RE SUPP
10.0000 mg | Freq: Every day | RECTAL | Status: DC | PRN
  Administered 2013-11-20: 19:00:00 10 mg via RECTAL
  Filled 2013-11-17: qty 1

## 2013-11-17 MED ORDER — PROMETHAZINE HCL 25 MG/ML IJ SOLN: 6.2500 mg | INTRAMUSCULAR | Status: DC | PRN

## 2013-11-17 MED ORDER — DOPAMINE-DEXTROSE 3.2-5 MG/ML-% IV SOLN
3.0000 ug/kg/min | INTRAVENOUS | Status: DC | PRN
  Filled 2013-11-17: qty 250

## 2013-11-17 MED ORDER — COENZYME Q10 30 MG PO CAPS
30.0000 mg | ORAL_CAPSULE | Freq: Every day | ORAL | Status: DC
  Filled 2013-11-17: qty 1

## 2013-11-17 MED ORDER — METOPROLOL TARTRATE 1 MG/ML IV SOLN
2.0000 mg | INTRAVENOUS | Status: DC | PRN
  Filled 2013-11-17: qty 5

## 2013-11-17 MED ORDER — FENTANYL CITRATE 0.05 MG/ML IJ SOLN
INTRAMUSCULAR | Status: AC
  Filled 2013-11-17: qty 5

## 2013-11-17 MED ORDER — AMLODIPINE BESYLATE 5 MG PO TABS
5.0000 mg | ORAL_TABLET | Freq: Every day | ORAL | Status: DC
  Administered 2013-11-17 – 2013-11-25 (×9): 5 mg via ORAL
  Filled 2013-11-17 (×10): qty 1

## 2013-11-17 MED ORDER — SODIUM CHLORIDE 0.9 % IV SOLN
500.0000 mL | Freq: Once | INTRAVENOUS | Status: AC | PRN
Start: 2013-11-17 — End: 2013-11-17

## 2013-11-17 MED ORDER — ONDANSETRON HCL 4 MG/2ML IJ SOLN
4.0000 mg | Freq: Four times a day (QID) | INTRAMUSCULAR | Status: DC | PRN
  Administered 2013-11-21: 4 mg via INTRAVENOUS
  Filled 2013-11-17: qty 2

## 2013-11-17 MED ORDER — GLYCOPYRROLATE 0.2 MG/ML IJ SOLN
INTRAMUSCULAR | Status: AC
  Filled 2013-11-17: qty 3

## 2013-11-17 MED ORDER — ACETAMINOPHEN 325 MG PO TABS
325.0000 mg | ORAL_TABLET | ORAL | Status: DC | PRN
  Administered 2013-11-21 – 2013-11-25 (×3): 650 mg via ORAL
  Filled 2013-11-17 (×3): qty 2

## 2013-11-17 MED ORDER — ALUM & MAG HYDROXIDE-SIMETH 200-200-20 MG/5ML PO SUSP: 15.0000 mL | ORAL | Status: DC | PRN

## 2013-11-17 MED ORDER — LACTATED RINGERS IV SOLN
INTRAVENOUS | Status: DC | PRN
  Administered 2013-11-17 (×2): via INTRAVENOUS

## 2013-11-17 MED ORDER — LABETALOL HCL 5 MG/ML IV SOLN
10.0000 mg | INTRAVENOUS | Status: DC | PRN
  Filled 2013-11-17: qty 4

## 2013-11-17 MED ORDER — HYDROCORTISONE NA SUCCINATE PF 100 MG IJ SOLR
INTRAMUSCULAR | Status: AC
  Filled 2013-11-17: qty 2

## 2013-11-17 MED ORDER — PHENOL 1.4 % MT LIQD: 1.0000 | OROMUCOSAL | Status: DC | PRN

## 2013-11-17 MED ORDER — DOCUSATE SODIUM 100 MG PO CAPS
100.0000 mg | ORAL_CAPSULE | Freq: Every day | ORAL | Status: DC
  Administered 2013-11-18 – 2013-11-25 (×8): 100 mg via ORAL
  Filled 2013-11-17 (×8): qty 1

## 2013-11-17 MED ORDER — DEXTROSE 5 % IV SOLN
1.5000 g | Freq: Two times a day (BID) | INTRAVENOUS | Status: AC
  Administered 2013-11-18 (×2): 1.5 g via INTRAVENOUS
  Filled 2013-11-17 (×3): qty 1.5

## 2013-11-17 MED ORDER — ONDANSETRON HCL 4 MG/2ML IJ SOLN
INTRAMUSCULAR | Status: AC
  Filled 2013-11-17: qty 2

## 2013-11-17 MED ORDER — HYDRALAZINE HCL 50 MG PO TABS
75.0000 mg | ORAL_TABLET | Freq: Three times a day (TID) | ORAL | Status: DC
  Administered 2013-11-18 – 2013-11-25 (×22): 75 mg via ORAL
  Filled 2013-11-17 (×30): qty 1

## 2013-11-17 MED ORDER — FENTANYL CITRATE 0.05 MG/ML IJ SOLN: 25.0000 ug | INTRAMUSCULAR | Status: DC | PRN

## 2013-11-17 MED ORDER — ROCURONIUM BROMIDE 50 MG/5ML IV SOLN
INTRAVENOUS | Status: AC
  Filled 2013-11-17: qty 1

## 2013-11-17 MED ORDER — LIDOCAINE HCL (CARDIAC) 20 MG/ML IV SOLN
INTRAVENOUS | Status: DC | PRN
Start: 2013-11-17 — End: 2013-11-17
  Administered 2013-11-17: 60 mg via INTRAVENOUS

## 2013-11-17 MED ORDER — GUAIFENESIN-DM 100-10 MG/5ML PO SYRP: 15.0000 mL | ORAL_SOLUTION | ORAL | Status: DC | PRN

## 2013-11-17 MED ORDER — LOSARTAN POTASSIUM 50 MG PO TABS
100.0000 mg | ORAL_TABLET | Freq: Every day | ORAL | Status: DC
  Administered 2013-11-17 – 2013-11-25 (×9): 100 mg via ORAL
  Filled 2013-11-17 (×10): qty 2

## 2013-11-17 MED ORDER — PROTAMINE SULFATE 10 MG/ML IV SOLN
INTRAVENOUS | Status: DC | PRN
  Administered 2013-11-17: 50 mg via INTRAVENOUS

## 2013-11-17 MED ORDER — MAGNESIUM SULFATE 40 MG/ML IJ SOLN
2.0000 g | Freq: Every day | INTRAMUSCULAR | Status: DC | PRN
  Filled 2013-11-17: qty 50

## 2013-11-17 MED ORDER — METOPROLOL SUCCINATE ER 25 MG PO TB24
25.0000 mg | ORAL_TABLET | Freq: Every day | ORAL | Status: DC
  Administered 2013-11-18 – 2013-11-21 (×4): 25 mg via ORAL
  Filled 2013-11-17 (×4): qty 1

## 2013-11-17 MED ORDER — SODIUM CHLORIDE 0.9 % IR SOLN
Status: DC | PRN
  Administered 2013-11-17: 12:00:00

## 2013-11-17 SURGICAL SUPPLY — 61 items
APL SKNCLS STERI-STRIP NONHPOA (GAUZE/BANDAGES/DRESSINGS) ×2
BANDAGE ESMARK 6X9 LF (GAUZE/BANDAGES/DRESSINGS) ×2 IMPLANT
BENZOIN TINCTURE PRP APPL 2/3 (GAUZE/BANDAGES/DRESSINGS) ×8 IMPLANT
BNDG CMPR 9X6 STRL LF SNTH (GAUZE/BANDAGES/DRESSINGS) ×1
BNDG ESMARK 6X9 LF (GAUZE/BANDAGES/DRESSINGS) ×4
CANISTER SUCTION 2500CC (MISCELLANEOUS) ×4 IMPLANT
CANNULA VESSEL 3MM 2 BLNT TIP (CANNULA) ×4 IMPLANT
CANNULA VESSEL W/WING WO/VALVE (CANNULA) ×4 IMPLANT
CLIP LIGATING EXTRA MED SLVR (CLIP) ×4 IMPLANT
CLIP LIGATING EXTRA SM BLUE (MISCELLANEOUS) ×4 IMPLANT
CLOSURE STERI-STRIP 1/2X4 (GAUZE/BANDAGES/DRESSINGS) ×2
CLOSURE WOUND 1/2 X4 (GAUZE/BANDAGES/DRESSINGS) ×1
CLSR STERI-STRIP ANTIMIC 1/2X4 (GAUZE/BANDAGES/DRESSINGS) ×6 IMPLANT
COVER SURGICAL LIGHT HANDLE (MISCELLANEOUS) ×4 IMPLANT
CUFF TOURNIQUET SINGLE 18IN (TOURNIQUET CUFF) IMPLANT
CUFF TOURNIQUET SINGLE 24IN (TOURNIQUET CUFF) ×4 IMPLANT
CUFF TOURNIQUET SINGLE 34IN LL (TOURNIQUET CUFF) IMPLANT
CUFF TOURNIQUET SINGLE 44IN (TOURNIQUET CUFF) IMPLANT
DRAIN SNY 10X20 3/4 PERF (WOUND CARE) IMPLANT
DRAPE WARM FLUID 44X44 (DRAPE) ×4 IMPLANT
DRAPE X-RAY CASS 24X20 (DRAPES) IMPLANT
DRSG COVADERM 4X10 (GAUZE/BANDAGES/DRESSINGS) IMPLANT
DRSG COVADERM 4X8 (GAUZE/BANDAGES/DRESSINGS) ×8 IMPLANT
ELECT REM PT RETURN 9FT ADLT (ELECTROSURGICAL) ×4
ELECTRODE REM PT RTRN 9FT ADLT (ELECTROSURGICAL) ×2 IMPLANT
EVACUATOR SILICONE 100CC (DRAIN) IMPLANT
GAUZE SPONGE 4X4 12PLY STRL (GAUZE/BANDAGES/DRESSINGS) ×4 IMPLANT
GLOVE BIOGEL PI IND STRL 6.5 (GLOVE) ×2 IMPLANT
GLOVE BIOGEL PI INDICATOR 6.5 (GLOVE) ×2
GLOVE SS BIOGEL STRL SZ 7.5 (GLOVE) ×2 IMPLANT
GLOVE SUPERSENSE BIOGEL SZ 7.5 (GLOVE) ×2
GLOVE SURG SS PI 6.5 STRL IVOR (GLOVE) ×8 IMPLANT
GOWN STRL REUS W/ TWL LRG LVL3 (GOWN DISPOSABLE) ×8 IMPLANT
GOWN STRL REUS W/TWL LRG LVL3 (GOWN DISPOSABLE) ×8
GRAFT PROPATEN W/RING 6X80X60 (Vascular Products) ×4 IMPLANT
INSERT FOGARTY SM (MISCELLANEOUS) IMPLANT
KIT BASIN OR (CUSTOM PROCEDURE TRAY) ×4 IMPLANT
KIT ROOM TURNOVER OR (KITS) ×4 IMPLANT
NS IRRIG 1000ML POUR BTL (IV SOLUTION) ×8 IMPLANT
PACK PERIPHERAL VASCULAR (CUSTOM PROCEDURE TRAY) ×4 IMPLANT
PAD ARMBOARD 7.5X6 YLW CONV (MISCELLANEOUS) ×8 IMPLANT
PADDING CAST COTTON 6X4 STRL (CAST SUPPLIES) ×4 IMPLANT
PROBE PENCIL 8 MHZ STRL DISP (MISCELLANEOUS) ×4 IMPLANT
SET COLLECT BLD 21X3/4 12 (NEEDLE) IMPLANT
STAPLER VISISTAT 35W (STAPLE) IMPLANT
STOPCOCK 4 WAY LG BORE MALE ST (IV SETS) IMPLANT
STRIP CLOSURE SKIN 1/2X4 (GAUZE/BANDAGES/DRESSINGS) ×3 IMPLANT
SUT ETHILON 3 0 PS 1 (SUTURE) IMPLANT
SUT PROLENE 5 0 C 1 24 (SUTURE) ×4 IMPLANT
SUT PROLENE 6 0 CC (SUTURE) ×8 IMPLANT
SUT SILK 2 0 FS (SUTURE) ×4 IMPLANT
SUT SILK 2 0 SH (SUTURE) ×4 IMPLANT
SUT VIC AB 2-0 CTX 27 (SUTURE) ×4 IMPLANT
SUT VIC AB 2-0 CTX 36 (SUTURE) ×8 IMPLANT
SUT VIC AB 3-0 SH 27 (SUTURE) ×6
SUT VIC AB 3-0 SH 27X BRD (SUTURE) ×6 IMPLANT
TOWEL OR 17X24 6PK STRL BLUE (TOWEL DISPOSABLE) ×8 IMPLANT
TRAY FOLEY CATH 16FRSI W/METER (SET/KITS/TRAYS/PACK) ×4 IMPLANT
TUBING EXTENTION W/L.L. (IV SETS) IMPLANT
UNDERPAD 30X30 INCONTINENT (UNDERPADS AND DIAPERS) ×4 IMPLANT
WATER STERILE IRR 1000ML POUR (IV SOLUTION) ×4 IMPLANT

## 2013-11-17 NOTE — H&P (View-Only) (Signed)
The patient's daughter called last night and spoke with Dr. Hart Rochester. He has had progressive rest pain in his right foot. He was asked to come to our office this morning for further evaluation. He reports that over the past week to 10 days he has had the worsening rest pain in his right foot. This is constant and intolerable for him.  He has a very complex patient. This is well documented with last visit with him on 10/14/2013. He has critical right carotid stenosis which fortunately remains asymptomatic. He has critical right limb ischemia and also has severe chronic mesenteric ischemia with a greater than 40 pound weight loss over the last 6 months. We have scheduled him for mesenteric arteriogram.  On physical exam he does have tendon rubor. Does have some Yosmar Ryker skin breakdown on the right fifth toe. He has no right femoral or distal pulses. Continues to have a normal 2+ femoral pulse on the left.  Impression and plan: Very complex patient with progressive critical limb ischemia on the right which is becoming intolerable for him. Fortunately he does have a sensation intact and therefore does not require emergent surgery today. I have recommended that we proceed with right femoral endarterectomy and possible right femoropopliteal depending on how he is doing surgical standpoint. I feel that we would be able to relieve his rest pain with common femoral endarterectomy and patch alone. Also explained options of vein femoropopliteal versus prosthetic femoropopliteal which would be a quicker operation for him. He is a very frail and is at significant risk for surgery. Explained would proceed with mesenteric arteriogram once critical limb ischemia has been corrected. He is her today with his daughter who understands as well and we will plan for surgery on 11/17/2013

## 2013-11-17 NOTE — Anesthesia Procedure Notes (Signed)
Procedure Name: Intubation Date/Time: 11/17/2013 12:59 PM Performed by: Angelica Pou Pre-anesthesia Checklist: Patient identified, Emergency Drugs available, Suction available, Patient being monitored and Timeout performed Patient Re-evaluated:Patient Re-evaluated prior to inductionOxygen Delivery Method: Circle system utilized Preoxygenation: Pre-oxygenation with 100% oxygen Intubation Type: IV induction Ventilation: Mask ventilation without difficulty and Oral airway inserted - appropriate to patient size Laryngoscope Size: Mac and 3 Grade View: Grade I Tube type: Oral Tube size: 7.5 mm Number of attempts: 1 Airway Equipment and Method: Stylet and Oral airway Placement Confirmation: ETT inserted through vocal cords under direct vision,  positive ETCO2 and breath sounds checked- equal and bilateral Secured at: 22 cm Tube secured with: Tape Dental Injury: Teeth and Oropharynx as per pre-operative assessment

## 2013-11-17 NOTE — Anesthesia Preprocedure Evaluation (Addendum)
Anesthesia Evaluation  Patient identified by MRN, date of birth, ID band Patient awake    Reviewed: Allergy & Precautions, H&P , NPO status , Patient's Chart, lab work & pertinent test results  Airway Mallampati: II TM Distance: >3 FB Neck ROM: Full    Dental  (+) Upper Dentures, Edentulous Lower   Pulmonary COPD COPD inhaler, former smoker,  breath sounds clear to auscultation        Cardiovascular hypertension, + CAD and + Peripheral Vascular Disease Rhythm:Regular Rate:Normal     Neuro/Psych negative neurological ROS  negative psych ROS   GI/Hepatic Neg liver ROS, GERD-  ,  Endo/Other  negative endocrine ROS  Renal/GU negative Renal ROS     Musculoskeletal negative musculoskeletal ROS (+)   Abdominal   Peds  Hematology negative hematology ROS (+)   Anesthesia Other Findings Critical right proximal ICA stenosis (99%) Mesenteric artery ischemia with weight loss  Reproductive/Obstetrics                         Anesthesia Physical Anesthesia Plan  ASA: III  Anesthesia Plan: General   Post-op Pain Management:    Induction: Intravenous  Airway Management Planned: Oral ETT  Additional Equipment:   Intra-op Plan:   Post-operative Plan: Extubation in OR  Informed Consent: I have reviewed the patients History and Physical, chart, labs and discussed the procedure including the risks, benefits and alternatives for the proposed anesthesia with the patient or authorized representative who has indicated his/her understanding and acceptance.   Dental advisory given  Plan Discussed with: CRNA and Surgeon  Anesthesia Plan Comments:         Anesthesia Quick Evaluation

## 2013-11-17 NOTE — Interval H&P Note (Signed)
History and Physical Interval Note:  11/17/2013 11:25 AM  Gary [name]  has presented today for surgery, with the diagnosis of Ischemia of foot   The various methods of treatment have been discussed with the patient and family. After consideration of risks, benefits and other options for treatment, the patient has consented to  Procedure(s): ENDARTERECTOMY FEMORAL (Right) BYPASS GRAFT FEMORAL-POPLITEAL ARTERY (Right) as a surgical intervention .  The patient's history has been reviewed, patient examined, no change in status, stable for surgery.  I have reviewed the patient's chart and labs.  Questions were answered to the patient's satisfaction.     Arnie Clingenpeel

## 2013-11-17 NOTE — Progress Notes (Signed)
ANTIBIOTIC CONSULT NOTE - INITIAL  Pharmacy Consult for Cefuroxime Indication: Post of prophylaxsis  Allergies  Allergen Reactions  . Contrast Media [Iodinated Diagnostic Agents] Itching  . Diovan [Valsartan] Swelling    angioedema  . Neosporin [Neomycin-Bacitracin Zn-Polymyx] Hives  . Neomycin Hives    Patient Measurements: Height: 5\' 8"  (172.7 cm) Weight: 115 lb 15.4 oz (52.6 kg) IBW/kg (Calculated) : 68.4  Vital Signs: Temp: 97.6 F (36.4 C) (10/12 1710) Temp Source: Oral (10/12 1710) BP: 156/57 mmHg (10/12 1710) Pulse Rate: 63 (10/12 1710) Intake/Output from previous day:   Intake/Output from this shift: Total I/O In: 1500 [I.V.:1500] Out: 590 [Urine:340; Blood:250]  Labs:  Recent Labs  11/17/13 1141  WBC 6.9  HGB 13.1  PLT 267  CREATININE 0.66   Estimated Creatinine Clearance: 58.4 ml/min (by C-G formula based on Cr of 0.66). No results found for this basename: VANCOTROUGH, Leodis Binet, VANCORANDOM, GENTTROUGH, GENTPEAK, GENTRANDOM, TOBRATROUGH, TOBRAPEAK, TOBRARND, AMIKACINPEAK, AMIKACINTROU, AMIKACIN,  in the last 72 hours   Microbiology: Recent Results (from the past 720 hour(s))  SURGICAL PCR SCREEN     Status: None   Collection Time    11/17/13 11:41 AM      Result Value Ref Range Status   MRSA, PCR NEGATIVE  NEGATIVE Final   Staphylococcus aureus NEGATIVE  NEGATIVE Final   Comment:            The Xpert SA Assay (FDA     approved for NASAL specimens     in patients over 73 years of age),     is one component of     a comprehensive surveillance     program.  Test performance has     been validated by [employer] for patients greater     than or equal to 75 year old.     It is not intended     to diagnose infection nor to     guide or monitor treatment.    Medical History: Past Medical History  Diagnosis Date  . 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 Hypertension   . PVD (peripheral  vascular disease) 04/27/2011    normal renal & abdominal aortography; s/p Left Fem-Pop BPG 2004.  Marland Kitchen Dyslipidemia   . Coronary artery disease 04/27/2011    Low risk Myoview in 2010:Non-obstructive disase, except for small OMB 95% ostial lesion  . H/O ETOH abuse   . GERD (gastroesophageal reflux disease)   . Alcohol abuse   . Carotid artery disease   . Pneumonia     Medications:  Prescriptions prior to admission  Medication Sig Dispense Refill  . amLODipine (NORVASC) 5 MG tablet Take 1 tablet (5 mg total) by mouth daily.  30 tablet  5  . aspirin EC 81 MG tablet Take 81 mg by mouth at bedtime.      Marland Kitchen atorvastatin (LIPITOR) 10 MG tablet Take 1 tablet (10 mg total) by mouth daily.  30 tablet  10  . chlorthalidone (HYGROTON) 25 MG tablet Take 25 mg by mouth daily.      . Choline Fenofibrate 135 MG capsule Take 135 mg by mouth daily.      Marland Kitchen co-enzyme Q-10 30 MG capsule Take 30 mg by mouth daily.      . folic acid (FOLVITE) 1 MG tablet Take 1 mg by mouth daily.      . hydrALAZINE (APRESOLINE) 50 MG tablet Take 1.5 tablets (75  mg total) by mouth 3 (three) times daily.  135 tablet  11  . losartan (COZAAR) 100 MG tablet Take 1 tablet (100 mg total) by mouth daily.  30 tablet  11  . metoprolol succinate (TOPROL-XL) 25 MG 24 hr tablet Take 1 tablet (25 mg total) by mouth daily.  30 tablet  9  . pantoprazole (PROTONIX) 40 MG tablet Take 40 mg by mouth daily.      . vitamin B-12 (CYANOCOBALAMIN) 100 MCG tablet Take 100 mcg by mouth daily.      Marland Kitchen. albuterol (PROVENTIL HFA;VENTOLIN HFA) 108 (90 BASE) MCG/ACT inhaler Inhale 2 puffs into the lungs every 4 (four) hours as needed for wheezing.  1 Inhaler  12  . pseudoephedrine (SUDAFED) 60 MG tablet Take 60 mg by mouth every 4 (four) hours as needed for congestion.       Assessment: 78 yo M admitted 11/17/2013  S/p ischemic foot and right femoral endarterectomy and fem pop bypass. Pharmacy consulted to renally adjust cefuroxime.  ID: post op Px 10/12  cefuroxime preop x 1 post op x 1, dose reasonable given size, no adjustment necessary.  Goal of Therapy:  Renal adjustment of antibiotics.  Pharmacy will sign off at this time. Call if questions.    Thank you for allowing pharmacy to be a part of this patients care team.  Lovenia KimJulie Kentrail Shew Pharm.D., BCPS, AQ-Cardiology Clinical Pharmacist 11/17/2013 5:28 PM Pager: [phone] Phone: [phone]

## 2013-11-17 NOTE — Progress Notes (Signed)
PHARMACIST - PHYSICIAN ORDER COMMUNICATION  CONCERNING: P&T Medication Policy on Herbal Medications  DESCRIPTION:  This patient's order for:  Coenzyme q10  has been noted.  This product(s) is classified as an "herbal" or natural product. Due to a lack of definitive safety studies or FDA approval, nonstandard manufacturing practices, plus the potential risk of unknown drug-drug interactions while on inpatient medications, the Pharmacy and Therapeutics Committee does not permit the use of "herbal" or natural products of this type within [facility].   ACTION TAKEN: The pharmacy department is unable to verify this order at this time and your patient has been informed of this safety policy. Please reevaluate patient's clinical condition at discharge and address if the herbal or natural product(s) should be resumed at that time.  Sheppard Coil PharmD., BCPS Clinical Pharmacist Pager [phone] 11/17/2013 6:05 PM

## 2013-11-17 NOTE — Anesthesia Postprocedure Evaluation (Signed)
  Anesthesia Post-op Note  Patient: Gary [name]  Procedure(s) Performed: Procedure(s): ENDARTERECTOMY FEMORAL (Right) BYPASS GRAFT FEMORAL-POPLITEAL ARTERY (Right)  Patient Location: PACU  Anesthesia Type:General  Level of Consciousness: awake, alert  and oriented  Airway and Oxygen Therapy: Patient Spontanous Breathing  Post-op Pain: none  Post-op Assessment: Post-op Vital signs reviewed  Post-op Vital Signs: Reviewed  Last Vitals:  Filed Vitals:   11/17/13 1710  BP: 156/57  Pulse: 63  Temp: 36.4 C  Resp: 21    Complications: No apparent anesthesia complications

## 2013-11-17 NOTE — Progress Notes (Signed)
Dr. Sampson Goon made aware of patients BP

## 2013-11-17 NOTE — Addendum Note (Signed)
Addendum created 11/17/13 1757 by Angelica Pou, CRNA   Modules edited: Charges VN

## 2013-11-17 NOTE — Transfer of Care (Signed)
Immediate Anesthesia Transfer of Care Note  Patient: Gary [name]  Procedure(s) Performed: Procedure(s): ENDARTERECTOMY FEMORAL (Right) BYPASS GRAFT FEMORAL-POPLITEAL ARTERY (Right)  Patient Location: PACU  Anesthesia Type:General  Level of Consciousness: awake, alert , oriented and patient cooperative  Airway & Oxygen Therapy: Patient Spontanous Breathing and Patient connected to nasal cannula oxygen  Post-op Assessment: Report given to PACU RN, Post -op Vital signs reviewed and stable and Patient moving all extremities  Post vital signs: Reviewed and stable  Complications: No apparent anesthesia complications

## 2013-11-17 NOTE — Progress Notes (Signed)
  [facility] Progress Note  11/17/2013 6:30 PM Day of Surgery  Subjective:  "Having feeling back in foot."   Tmax 97.9 BP sys 170s-210s 02 100% 2L  Filed Vitals:   11/17/13 1800  BP: 157/56  Pulse: 68  Temp:   Resp: 16    Physical Exam: Incisions:  Right medial leg incisions dressed with minimal drainage Extremities:  Right foot is warm with palpable DP pulse. Motor and sensory intact right foot.   CBC    Component Value Date/Time   WBC 6.9 11/17/2013 1141   RBC 4.30 11/17/2013 1141   HGB 13.1 11/17/2013 1141   HCT 38.4* 11/17/2013 1141   PLT 267 11/17/2013 1141   MCV 89.3 11/17/2013 1141   MCH 30.5 11/17/2013 1141   MCHC 34.1 11/17/2013 1141   RDW 14.0 11/17/2013 1141   LYMPHSABS 1.3 05/01/2013 1518   MONOABS 1.1* 05/01/2013 1518   EOSABS 0.0 05/01/2013 1518   BASOSABS 0.1 05/01/2013 1518    BMET    Component Value Date/Time   NA 136* 11/17/2013 1141   K 3.6* 11/17/2013 1141   CL 97 11/17/2013 1141   CO2 24 11/17/2013 1141   GLUCOSE 97 11/17/2013 1141   BUN 15 11/17/2013 1141   CREATININE 0.66 11/17/2013 1141   CREATININE 0.73 07/28/2013 0934   CALCIUM 9.4 11/17/2013 1141   GFRNONAA >90 11/17/2013 1141   GFRAA >90 11/17/2013 1141    INR    Component Value Date/Time   INR 0.99 11/17/2013 1141     Intake/Output Summary (Last 24 hours) at 11/17/13 1830 Last data filed at 11/17/13 1600  Gross per 24 hour  Intake   1500 ml  Output    590 ml  Net    910 ml     Assessment:  78 y.o. male is s/p: right femoral endarterectomy, right femoral-popliteal artery bypass graft with propaten  Day of Surgery  Plan: -Right foot with palpable DP pulse. -Mobilize. -DVT prophylaxis:  Lovenox    Maris Berger, PA-C [facility] Office: [phone] Pager: [phone] 11/17/2013 6:30 PM

## 2013-11-18 DIAGNOSIS — I70221 Atherosclerosis of native arteries of extremities with rest pain, right leg: Secondary | ICD-10-CM | POA: Diagnosis not present

## 2013-11-18 DIAGNOSIS — R58 Hemorrhage, not elsewhere classified: Secondary | ICD-10-CM

## 2013-11-18 LAB — BASIC METABOLIC PANEL
Anion gap: 13 (ref 5–15)
BUN: 15 mg/dL (ref 6–23)
CALCIUM: 8.7 mg/dL (ref 8.4–10.5)
CO2: 25 mEq/L (ref 19–32)
Chloride: 98 mEq/L (ref 96–112)
Creatinine, Ser: 0.71 mg/dL (ref 0.50–1.35)
GFR calc Af Amer: >90 mL/min (ref >90)
GFR calc non Af Amer: 89 mL/min — ABNORMAL LOW (ref >90)
GLUCOSE: 109 mg/dL — AB (ref 70–99)
Potassium: 3.9 mEq/L (ref 3.7–5.3)
SODIUM: 136 meq/L — AB (ref 137–147)

## 2013-11-18 LAB — CBC
HEMATOCRIT: 30.2 % — AB (ref 39.0–52.0)
HEMOGLOBIN: 10.4 g/dL — AB (ref 13.0–17.0)
MCH: 30.6 pg (ref 26.0–34.0)
MCHC: 34.4 g/dL (ref 30.0–36.0)
MCV: 88.8 fL (ref 78.0–100.0)
Platelets: 228 10*3/uL (ref 150–400)
RBC: 3.4 MIL/uL — ABNORMAL LOW (ref 4.22–5.81)
RDW: 14 % (ref 11.5–15.5)
WBC: 8.7 10*3/uL (ref 4.0–10.5)

## 2013-11-18 MED ORDER — METHYLPREDNISOLONE SODIUM SUCC 125 MG IJ SOLR
125.0000 mg | Freq: Once | INTRAMUSCULAR | Status: DC
  Filled 2013-11-18 (×2): qty 2

## 2013-11-18 MED ORDER — FAMOTIDINE 20 MG PO TABS
20.0000 mg | ORAL_TABLET | Freq: Once | ORAL | Status: DC
  Filled 2013-11-18: qty 1

## 2013-11-18 MED ORDER — DIPHENHYDRAMINE HCL 25 MG PO CAPS
25.0000 mg | ORAL_CAPSULE | Freq: Once | ORAL | Status: DC
  Filled 2013-11-18: qty 1

## 2013-11-18 MED ORDER — CEFAZOLIN SODIUM 1-5 GM-% IV SOLN
1.0000 g | Freq: Once | INTRAVENOUS | Status: DC
  Filled 2013-11-18: qty 50

## 2013-11-18 MED ORDER — ENSURE COMPLETE PO LIQD
237.0000 mL | Freq: Two times a day (BID) | ORAL | Status: DC
  Administered 2013-11-18 – 2013-11-20 (×3): 237 mL via ORAL
  Filled 2013-11-18 (×6): qty 237

## 2013-11-18 NOTE — Progress Notes (Addendum)
  [facility] Progress Note  11/18/2013 7:42 AM 1 Day Post-Op  Subjective:  Not able to sleep last night due to pain in his feet. Pain not well-controlled. Hypotensive last night so received minimal morphine. Patient says tylenol helps with his pain.   Tmax 98.6 BP sys 110s-210s 02 95% RA  Filed Vitals:   11/18/13 0452  BP: 104/46  Pulse: 71  Temp: 98.6 F (37 C)  Resp: 25    Physical Exam: Incisions:  Right groin and lower leg incisions dressed with no drainage on bandages. Palpable right DP pulse. + doppler signals right PT, left DP/PT Extremities:  Motor and sensory intact lower extremities bilaterally Cardiac: regular rate and rhythm, murmur heard   Lungs: normal respiratory effort.   CBC    Component Value Date/Time   WBC 8.7 11/18/2013 0600   RBC 3.40* 11/18/2013 0600   HGB 10.4* 11/18/2013 0600   HCT 30.2* 11/18/2013 0600   PLT 228 11/18/2013 0600   MCV 88.8 11/18/2013 0600   MCH 30.6 11/18/2013 0600   MCHC 34.4 11/18/2013 0600   RDW 14.0 11/18/2013 0600   LYMPHSABS 1.3 05/01/2013 1518   MONOABS 1.1* 05/01/2013 1518   EOSABS 0.0 05/01/2013 1518   BASOSABS 0.1 05/01/2013 1518    BMET    Component Value Date/Time   NA 136* 11/18/2013 0600   K 3.9 11/18/2013 0600   CL 98 11/18/2013 0600   CO2 25 11/18/2013 0600   GLUCOSE 109* 11/18/2013 0600   BUN 15 11/18/2013 0600   CREATININE 0.71 11/18/2013 0600   CREATININE 0.73 07/28/2013 0934   CALCIUM 8.7 11/18/2013 0600   GFRNONAA 89* 11/18/2013 0600   GFRAA >90 11/18/2013 0600    INR    Component Value Date/Time   INR 0.99 11/17/2013 1141     Intake/Output Summary (Last 24 hours) at 11/18/13 0742 Last data filed at 11/18/13 0025  Gross per 24 hour  Intake 1612.5 ml  Output   1065 ml  Net  547.5 ml     Assessment:  78 y.o. male is s/p:  Right femoral-popliteal artery bypass with propaten, femoral endarterectomy  1 Day Post-Op  Plan: -Palpable right DP pulse. Doppler signals  right PT, left DP/PT -Will take down dressings tomorrow.  -Continue pain control. Had hypotension last night. Morphine held. Patient says tylenol helps.  -Mobilize, get out of bed. -D/C foley and a-line -DVT prophylaxis: lovenox -Patient to go for mesenteric arteriogram tomorrow.  -Transfer to 2W  Maris Berger, PA-C [facility] Office: [phone] Pager: [phone] 11/18/2013 7:42 AM     I have examined the patient, reviewed and agree with above. Doing well postop. Continue mobilization. Plan mesenteric arteriogram and possible angioplasty tomorrow with Dr. Imogene Burn. Discussed with patient and daughter present.  Clovia Reine, MD 11/18/2013 8:48 AM

## 2013-11-18 NOTE — Progress Notes (Addendum)
INITIAL NUTRITION ASSESSMENT  DOCUMENTATION CODES Per approved criteria  -Underweight   INTERVENTION:  Ensure Complete PO BID, each supplement provides 350 kcal and 13 grams of protein  NUTRITION DIAGNOSIS: Inadequate oral intake related to increased energy expenditure as evidenced by BMI=17.6.   Goal: Intake to meet >90% of estimated nutrition needs.  Monitor:  PO intake, labs, weight trend.  Reason for Assessment: MST  78 y.o. male  Admitting Dx: Ischemia of foot  ASSESSMENT: Patient presented to the hospital on 10/12 for right foot ischemia. S/P endarterectomy femoral (right) and bypass graft femoral-popliteal artery (right) on 10/12.  Patient is currently resting, unable to obtain any nutrition history or complete nutrition focused physical exam at this time. Patient is underweight with BMI = 17.6.   Height: Ht Readings from Last 1 Encounters:  11/17/13 5\' 8"  (1.727 m)    Weight: Wt Readings from Last 1 Encounters:  11/17/13 115 lb 15.4 oz (52.6 kg)    Ideal Body Weight: 70 kg  % Ideal Body Weight: 75%  Wt Readings from Last 10 Encounters:  11/17/13 115 lb 15.4 oz (52.6 kg)  11/17/13 115 lb 15.4 oz (52.6 kg)  11/13/13 120 lb (54.432 kg)  10/14/13 118 lb (53.524 kg)  09/15/13 113 lb 3.2 oz (51.347 kg)  08/04/13 117 lb (53.071 kg)  08/04/13 117 lb (53.071 kg)  07/17/13 117 lb (53.071 kg)  06/17/13 119 lb 6.4 oz (54.159 kg)  05/19/13 120 lb (54.432 kg)    Usual Body Weight: 120 lb  % Usual Body Weight: 96%  BMI:  Body mass index is 17.64 kg/(m^2). Underweight  Estimated Nutritional Needs: Kcal: 1700-1900 Protein: 85-95 gm Fluid: 1.8 L  Skin: right groin incision, right leg incision  Diet Order: Cardiac  EDUCATION NEEDS: -Education not appropriate at this time   Intake/Output Summary (Last 24 hours) at 11/18/13 0904 Last data filed at 11/18/13 0452  Gross per 24 hour  Intake 1612.5 ml  Output   1165 ml  Net  447.5 ml    Last BM:  10/12   Labs:   Recent Labs Lab 11/17/13 1141 11/18/13 0600  NA 136* 136*  K 3.6* 3.9  CL 97 98  CO2 24 25  BUN 15 15  CREATININE 0.66 0.71  CALCIUM 9.4 8.7  GLUCOSE 97 109*    CBG (last 3)  No results found for this basename: GLUCAP,  in the last 72 hours  Scheduled Meds: . amLODipine  5 mg Oral Daily  . atorvastatin  10 mg Oral Daily  . cefUROXime (ZINACEF)  IV  1.5 g Intravenous Q12H  . chlorthalidone  25 mg Oral Daily  . docusate sodium  100 mg Oral Daily  . enoxaparin (LOVENOX) injection  40 mg Subcutaneous Q24H  . fenofibrate  160 mg Oral Daily  . folic acid  1 mg Oral Daily  . hydrALAZINE  75 mg Oral TID  . Influenza vac split quadrivalent PF  0.5 mL Intramuscular Tomorrow-1000  . losartan  100 mg Oral Daily  . metoprolol succinate  25 mg Oral Daily  . pantoprazole  40 mg Oral Daily  . vitamin B-12  100 mcg Oral Daily    Continuous Infusions: . 0.9 % NaCl with KCl 20 mEq / L 50 mL/hr at 11/17/13 1545    Past Medical History  Diagnosis Date  . 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 Hypertension   . PVD (peripheral vascular disease) 04/27/2011  [phone] CeExtended [facility] Of [facility]Kind[facility]<BADTEXCammie MW609 [phone].LoletTrudie Reed30 [phone] Edw rDel2 oletTrud37ie [phone] BADTEXTTAG>[phone] VirginiaCammie McgeeHafa Adai Specialist GroupHighline Medical CenterMardene [phone] LeitzRoyann Shivers Runell GessSamuella Bruin Yevette [phone] KiefMassachusettsKathi Der61 Trudie ReedLoleta Rose21.3Deliah [phone] VirginiaCammie McgeeCentral Washington HospitalPromise [facility].Mardene [phone] Leitz

## 2013-11-18 NOTE — Progress Notes (Signed)
PT Cancellation Note  Patient Details Name: Gary [name] MRN: [id number] DOB: [birth year]   Cancelled Treatment:    Reason Eval/Treat Not Completed: Fatigue/lethargy limiting ability to participate.  Agreed to try after procedure tomorrow   Ivar Drape 11/18/2013, 4:59 PM  Samul Dada, PT MS Acute Rehab Dept. Number: [phone]

## 2013-11-18 NOTE — Op Note (Signed)
OPERATIVE REPORT  DATE OF SURGERY: 11/18/2013  PATIENT: Gary [name], 78 y.o. male MRN: [id number]  DOB: Mar 27, 1935  PRE-OPERATIVE DIAGNOSIS: Critical limb ischemia with rest pain right foot  POST-OPERATIVE DIAGNOSIS:  Same  PROCEDURE: Right external iliac and common femoral endarterectomy and right femoral to below knee popliteal bypass with 6 mm propatent Gore-Tex graft  SURGEON:  Gretta Began, M.D.  PHYSICIAN ASSISTANT: Trinh  ANESTHESIA:  Gen.  EBL: 100 ml     BLOOD ADMINISTERED: None  DRAINS: None  SPECIMEN: None  COUNTS CORRECT:  YES  PLAN OF CARE: To PACU stable   PATIENT DISPOSITION:  PACU - hemodynamically stable  PROCEDURE DETAILS: The patient was taken to the operating placed supine position where the area of the right groin and right leg were prepped and draped in usual sterile fashion. An incision was made over the groin and carried down to the femoral artery. This was extensively calcified. The artery was exposed up under the inguinal ligament. There appeared to be an old hernia repair at this area. The external iliac artery was exposed well up under the inguinal ligament and was soft at this area. Dissection was taken down onto the superficial femoral artery and profundus femoral artery. The return branches of the profundus were dissected out and controlled with vessel loops. The superficial femoral artery was chronically occluded. The profunda branches were small and diseased and the decision was made to proceed with femoral-popliteal bypass. The vein had been imaged with SonoSite and was small in the cath a normal caliber in the above-knee position. Decision was made to the medial approach the popliteal artery. The artery was extensively calcified. Preoperative arteriogram Theda Belfast was patent. A tunnel was created from the below-knee popliteal artery to the groin. The patient was given 6000 intravenous heparin after adequate circulation time the external  iliac artery was occluded above the inguinal ligament. Profundus branches were controlled with vessel loops. The common femoral artery was opened with 11 blade and sagittal extremity with Potts scissors onto the superficial artery. The superficial femoral artery was chronically occluded and was therefore ligated with silk tie. The endarterectomy was begun on the external iliac artery and extended down into the profundus femoris artery was a good endpoint. The branches were small. A 6 mm propatent Gore-Tex removal ring graft was brought onto the field. This was spatulated and sewn as a patch angioplasty beginning on the external iliac artery and extending down onto the profunda. A running 6-0 Prolene suture. Clamps were removed and good anastomosis was encountered. The graft was then brought prior created tunnel. The graft was flushed with heparinized and reoccluded. The pneumatic tourniquet was placed on the above-knee position. The leg was exsanguinated with an Esmarch tourniquet and the pneumatic tourniquet was inflated to 250 mm mercury. The below-knee popliteal artery was opened with 11 blade small shouldn't with Potts scissors. It was extensively calcified but did have a good flow. The graft was cut to appropriate length spatulated and sewn end-to-side to the artery with a running 6-0 Prolene suture. Prior to completion of the anastomosis the tourniquet was deflated and the usual flushing maneuvers were undertaken. The anastomosis was completed. There was excellent flow by Doppler in the foot the graft patent. The patient was given 50 mg of protamine to reverse the heparin. Richard with saline. Hemostasis tablet cautery. Wounds were closed with 3-0 Vicryl in the subcutaneous and subcuticular tissue. Benzoin and Steri-Strips were applied   Gretta Began, M.D. 11/18/2013 8:40 AM

## 2013-11-18 NOTE — Progress Notes (Signed)
Utilization review completed.  

## 2013-11-18 NOTE — Progress Notes (Signed)
PT Cancellation Note  Patient Details Name: Gary [name] MRN: [id number] DOB: 12-03-1935   Cancelled Treatment:    Reason Eval/Treat Not Completed: Pain limiting ability to participate--pt received pain medicine at 1242. Will return later today for evaluation.   [name] 11/18/2013, 12:52 PM Pager [phone]

## 2013-11-18 NOTE — Progress Notes (Signed)
11/18/2013 3:31 PM Nursing note   six hours post foley removal; pt denies being able to urinate despite many efforts and increasing discomfort. Bladder scan reveals 426 cc urine in bladder. Pt. Unable to spontaneously void. Karsten Ro [facility] paged and made aware. Verbal order for I&O cath x1. Order enacted and 400 cc clear yellow urine returned from bladder. Pt. States relief from his symptoms at this time. Will continue to closely monitor patient.  Lenix Benoist, Blanchard Kelch

## 2013-11-18 NOTE — Progress Notes (Signed)
Pt. Transferring to [id number].  Report given to [facility] informed receiving nurse patients foley was removed at 0900.  Currently waiting for patient to void, post void still needed.  Pt. Stable at transfer.  Family aware of transfer.

## 2013-11-18 NOTE — Evaluation (Signed)
Occupational Therapy Evaluation Patient Details Name: Gary [name] MRN: [id number] DOB: 1935-09-01 Today's Date: 11/18/2013    History of Present Illness Right external iliac and common femoral endarterectomy and right femoral to below knee popliteal bypass. 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   Clinical Impression   This 78 yo male admitted and underwent above presents to acute OT with decreased balance, decreased mobility, and decreased AROM of RLE all affecting his ability to take care of himself at an independent level as he was able to pta. He will benefit from acute OT with follow up OT at SNF.    Follow Up Recommendations  SNF    Equipment Recommendations   (TBD at next venue)       Precautions / Restrictions Precautions Precautions: Fall Restrictions Weight Bearing Restrictions: No      Mobility Bed Mobility Overal bed mobility: Modified Independent             General bed mobility comments: Increased time, HOB up and use of rail  Transfers Overall transfer level: Needs assistance Equipment used: Rolling walker (2 wheeled) Transfers: Sit to/from Stand Sit to Stand: Min assist              Balance Overall balance assessment: Needs assistance Sitting-balance support: No upper extremity supported;Feet supported Sitting balance-Leahy Scale: Good     Standing balance support: Bilateral upper extremity supported Standing balance-Leahy Scale: Poor Standing balance comment: Pt not wanting to put his foot down and bear weight, how to keep cuing  him to try and do this                            ADL Overall ADL's : Needs assistance/impaired Eating/Feeding: Independent;Sitting   Grooming: Set up;Sitting   Upper Body Bathing: Set up;Sitting   Lower Body Bathing: Moderate assistance (with min A sit<>stand)   Upper Body Dressing : Set up;Sitting   Lower Body Dressing: Maximal assistance (with min A sit<>stand)   Toilet Transfer: Minimal assistance;Ambulation;RW (bed>15 feet>sit in recliner behind him)   Toileting- ArchitectClothing Manipulation and Hygiene: Min guard;Sit to/from stand                               Hand Dominance Right   Extremity/Trunk Assessment Upper Extremity Assessment Upper Extremity Assessment: Overall WFL for tasks assessed   Lower Extremity Assessment Lower Extremity Assessment: Defer to PT evaluation       Communication Communication Communication: No difficulties   Cognition Arousal/Alertness: Awake/alert Behavior During Therapy: WFL for tasks assessed/performed Overall Cognitive Status: Within Functional Limits for tasks assessed                                Home Living Family/patient expects to be discharged to:: Skilled nursing facility                                 Additional Comments: He lives with son who works  during the day      Prior Functioning/Environment Level of Independence: Independent             OT Diagnosis: Generalized weakness   OT Problem List: Decreased strength;Impaired balance (sitting and/or standing)   OT Treatment/Interventions: Self-care/ADL training;Balance training;DME and/or AE instruction    OT Goals(Current goals can be found in the care plan section) Acute Rehab OT Goals Patient Stated Goal: to go home--I dicussed that I thougt it would be better for him to have a short rehab stay and then go home since he is alone when his son is at work OT Goal Formulation: With patient Time For Goal  Achievement: 11/25/13 Potential to Achieve Goals: Good  OT Frequency: Min 2X/week   Barriers to D/C: Decreased caregiver support             End of Session Equipment Utilized During Treatment: Gait belt;Rolling walker  Activity Tolerance: Patient tolerated treatment well Patient left: in chair;with call bell/phone within reach;with family/visitor present   Time: 2774-1287 OT Time Calculation (min): 23 min Charges:  OT General Charges $OT Visit: 1 Procedure OT Evaluation $Initial OT Evaluation Tier I: 1 Procedure OT Treatments $Self Care/Home Management : 8-22 mins  Gary [name] [phone] 11/18/2013, 5:53 PM

## 2013-11-19 ENCOUNTER — Encounter (HOSPITAL_COMMUNITY)
Admission: RE | Disposition: A | Discharge status: Skilled Nursing Facility | Payer: Self-pay | Source: Ambulatory Visit | Attending: Vascular Surgery

## 2013-11-19 ENCOUNTER — Encounter (HOSPITAL_COMMUNITY): Payer: Self-pay | Admitting: Vascular Surgery

## 2013-11-19 DIAGNOSIS — K550 Acute vascular disorders of intestine: Secondary | ICD-10-CM

## 2013-11-19 HISTORY — PX: PERCUTANEOUS STENT INTERVENTION: SHX5500

## 2013-11-19 HISTORY — PX: VISCERAL ANGIOGRAM: SHX5515

## 2013-11-19 LAB — CBC
HEMATOCRIT: 32.4 % — AB (ref 39.0–52.0)
Hemoglobin: 11.2 g/dL — ABNORMAL LOW (ref 13.0–17.0)
MCH: 30.9 pg (ref 26.0–34.0)
MCHC: 34.6 g/dL (ref 30.0–36.0)
MCV: 89.5 fL (ref 78.0–100.0)
Platelets: 224 10*3/uL (ref 150–400)
RBC: 3.62 MIL/uL — AB (ref 4.22–5.81)
RDW: 13.9 % (ref 11.5–15.5)
WBC: 7.8 10*3/uL (ref 4.0–10.5)

## 2013-11-19 LAB — BASIC METABOLIC PANEL
Anion gap: 13 (ref 5–15)
BUN: 16 mg/dL (ref 6–23)
CHLORIDE: 95 meq/L — AB (ref 96–112)
CO2: 26 meq/L (ref 19–32)
CREATININE: 0.74 mg/dL (ref 0.50–1.35)
Calcium: 8.8 mg/dL (ref 8.4–10.5)
GFR calc Af Amer: >90 mL/min (ref >90)
GFR calc non Af Amer: 87 mL/min — ABNORMAL LOW (ref >90)
Glucose, Bld: 94 mg/dL (ref 70–99)
Potassium: 3.7 mEq/L (ref 3.7–5.3)
Sodium: 134 mEq/L — ABNORMAL LOW (ref 137–147)

## 2013-11-19 LAB — POCT ACTIVATED CLOTTING TIME
ACTIVATED CLOTTING TIME: 276 s
ACTIVATED CLOTTING TIME: 214 s
ACTIVATED CLOTTING TIME: 163 s
Activated Clotting Time: 185 seconds

## 2013-11-19 SURGERY — VISCERAL ANGIOGRAM
Anesthesia: LOCAL

## 2013-11-19 MED ORDER — HEPARIN SODIUM (PORCINE) 1000 UNIT/ML IJ SOLN
INTRAMUSCULAR | Status: AC
  Filled 2013-11-19: qty 1

## 2013-11-19 MED ORDER — FENTANYL CITRATE 0.05 MG/ML IJ SOLN
INTRAMUSCULAR | Status: AC
  Filled 2013-11-19: qty 2

## 2013-11-19 MED ORDER — METHYLPREDNISOLONE SODIUM SUCC 125 MG IJ SOLR
125.0000 mg | Freq: Once | INTRAMUSCULAR | Status: AC
  Administered 2013-11-19: 125 mg via INTRAVENOUS
  Filled 2013-11-19: qty 2

## 2013-11-19 MED ORDER — MIDAZOLAM HCL 2 MG/2ML IJ SOLN
INTRAMUSCULAR | Status: AC
  Filled 2013-11-19: qty 2

## 2013-11-19 MED ORDER — NITROGLYCERIN 1 MG/10 ML FOR IR/CATH LAB
INTRA_ARTERIAL | Status: AC
  Filled 2013-11-19: qty 10

## 2013-11-19 MED ORDER — CLOPIDOGREL BISULFATE 75 MG PO TABS
75.0000 mg | ORAL_TABLET | Freq: Every day | ORAL | Status: DC
  Administered 2013-11-20 – 2013-11-25 (×6): 75 mg via ORAL
  Filled 2013-11-19 (×6): qty 1

## 2013-11-19 MED ORDER — FAMOTIDINE 20 MG PO TABS
20.0000 mg | ORAL_TABLET | Freq: Once | ORAL | Status: AC
  Administered 2013-11-19: 20 mg via ORAL
  Filled 2013-11-19: qty 1

## 2013-11-19 MED ORDER — HEPARIN (PORCINE) IN NACL 2-0.9 UNIT/ML-% IJ SOLN
INTRAMUSCULAR | Status: AC
  Filled 2013-11-19: qty 1000

## 2013-11-19 MED ORDER — DIPHENHYDRAMINE HCL 25 MG PO CAPS
25.0000 mg | ORAL_CAPSULE | Freq: Once | ORAL | Status: AC
  Administered 2013-11-19: 25 mg via ORAL
  Filled 2013-11-19 (×2): qty 1

## 2013-11-19 MED ORDER — CLOPIDOGREL BISULFATE 75 MG PO TABS
300.0000 mg | ORAL_TABLET | Freq: Once | ORAL | Status: AC
  Administered 2013-11-19: 300 mg via ORAL
  Filled 2013-11-19: qty 4

## 2013-11-19 MED ORDER — SODIUM CHLORIDE 0.9 % IV SOLN
INTRAVENOUS | Status: DC
  Administered 2013-11-20: 07:00:00 via INTRAVENOUS
  Administered 2013-11-22: 1000 mL via INTRAVENOUS

## 2013-11-19 MED ORDER — LIDOCAINE HCL (PF) 1 % IJ SOLN
INTRAMUSCULAR | Status: AC
  Filled 2013-11-19: qty 30

## 2013-11-19 NOTE — Progress Notes (Signed)
PT Cancellation Note  Patient Details Name: JAADEN CLOTFELTER MRN: [id number] DOB: 1935-03-27   Cancelled Treatment:    Reason Eval/Treat Not Completed: Other (comment).  Spoke with RN who notes daughter just arrived to speak with pt about his refusal of procedure today.  RN states this conversation may take a while, so PT will return another time.     Chelby Salata, Alison Murray 11/19/2013, 10:23 AM

## 2013-11-19 NOTE — Progress Notes (Signed)
11/19/2013 8:54 AM Nursing note Pt. Had short burst of SVT rate 170 immediately converting back to NSR rate 84. Pt. In bed asleep, asymptomatic. VSS will continue to closely monitor patient.  Timera Windt, Blanchard Kelch

## 2013-11-19 NOTE — Progress Notes (Signed)
Site area: left brachial a 6 french sheath was removed  Site Prior to Removal:  Level 0  Pressure Applied For 20 MINUTES    Minutes Beginning at 1700  Manual:   Yes.    Patient Status During Pull:  stable  Post Pull Groin Site:  Level 0  Post Pull Instructions Given:  Yes.    Post Pull Pulses Present:  Yes.    Dressing Applied:  Yes.    Comments:  VS remain stable thru sheath removal.  Pt denies any discomfort at brachial site

## 2013-11-19 NOTE — Progress Notes (Signed)
11/18/2013 09:30 PM Progress Notes The patient had not voided since a straight cath was put in earlier in the afternoon.  A bladder scan showed 490 cc of urine in the bladder. When the patient tried to void he could not do it on his own.  Dr. Myra Gianotti was informed and I asked if I could put in a foley catheter instead of a straigh cath since the patient is going for a procedure tomorrow.  Dr. Myra Gianotti agreed to it. Gary [name]

## 2013-11-19 NOTE — Progress Notes (Signed)
PT Cancellation Note  Patient Details Name: CHANDAN BEAULIEU MRN: [id number] DOB: 01/12/1936   Cancelled Treatment:    Reason Eval/Treat Not Completed: Patient at procedure or test/unavailable.  Will f/u tomorrow.     Gearl Baratta, Alison Murray 11/19/2013, 2:07 PM

## 2013-11-19 NOTE — Progress Notes (Signed)
11/19/2013 0730 Pt. Refusing pre-medications ordered for mesenteric arteriogram scheduled for this am from night shift rn. Pt. States he does not want any more procedures done to him this admission. Dr. Darrick Penna on call and made aware of pt. Concerns. Verbal order received to keep pt. NPO until further evaluated by Dr. Arbie Cookey this am. Will continue to monitor patient.  Antonieta Slaven, Blanchard Kelch

## 2013-11-19 NOTE — Care Management Note (Addendum)
    Page 1 of 2   11/26/2013     3:31:44 PM CARE MANAGEMENT NOTE 11/26/2013  Patient:  Gary [name],Gary [name]   Account Number:  [id number]  Date Initiated:  11/19/2013  Documentation initiated by:  HUTCHINSON,CRYSTAL  Subjective/Objective Assessment:   Rest pain Right foot; Ischemia of foot: chronic mesenteric ischemia     Action/Plan:   CM to follow for disposition needs   Anticipated DC Date:  11/25/2013   Anticipated DC Plan:  SKILLED NURSING FACILITY  In-house referral  Clinical Social Worker      DC Planning Services  CM consult      Choice offered to / List presented to:             Status of service:  Completed, signed off Medicare Important Message given?  YES (If response is "NO", the following Medicare IM given date fields will be blank) Date Medicare IM given:  11/20/2013 Medicare IM given by:  HUTCHINSON,CRYSTAL Date Additional Medicare IM given:  11/24/2013 Additional Medicare IM given by:  Antonino Nienhuis  Discharge Disposition:  SKILLED NURSING FACILITY  Per UR Regulation:  Reviewed for med. necessity/level of care/duration of stay  If discussed at Long Length of Stay Meetings, dates discussed:   11/25/2013    Comments:  11/25/13 Sidney Ace, RN, BSN [phone] Pt discharged to SNF today, per CSW arrangements.  Crystal Hutchinson RN, BSN, MSHL, CCM  Nurse - Case Manager,  (Unit [phone]  11/20/2013 Social:  From home with son.  Son works 8-5.  Supportive DTR/[name].  Ambulates via RW and hx/o past falls r/t medication changes. Medications:  DTR asssit with meds but patient admits to forgetting his meds and is easily distracted. Transportation:  Family Home DME:  RW, Shower stool, BP cuff, safety rails in shower. HHS:  Hx/o past services with [facility] and prefers to use them again. IDT:  Hx/o past visits with Pulmonologist; states only f/u about 3 appt's and stopped going. PCP:  Dr. Nicki Reaper - last appt / patient unable to recall.  States hx/o seeing  about yearly Med Review:  Plavix OT RECS:  SNF PT RECS:  No PT follow up;Supervision - Intermittent;Other (comment) (doubl he will need HHPT, depends on progress) DME RECS:  None Dispo Plan:  HHS:  RN, PT for progression needs ([facility] notified)   Staff Nurse Rosanne Ashing updates CM that DTR has called and interested in SNF. CM advised to notify Unit SW to f/u on SNF education and options. CM advised that patient has option of LTC even if no skilled need and SW will be able to discuss cost of LTC services. CM advised to continue to adress request for face to face in the event LTC services is not affordable.

## 2013-11-19 NOTE — Op Note (Signed)
OPERATIVE NOTE   PROCEDURE: 1.  Left brachial artery cannulation under ultrasound guidance 2.  Third order arterial selection 3.  Aortogram 4.  Selective cannulation of celiac artery 5.  Celiac angiogram 6.  Angioplasty and stenting of celiac artery (5 mm x 12 mm) 7.  Selective cannulation of superior mesenteric artery  8.  Angioplasty and stenting of superior mesenteric artery (6 mm x 12 mm)  PRE-OPERATIVE DIAGNOSIS: chronic mesenteric ischemia  POST-OPERATIVE DIAGNOSIS: same as above   SURGEON: Adele Barthel, MD  ANESTHESIA: conscious sedation  ESTIMATED BLOOD LOSS: 100 cc  CONTRAST: 230 cc  FINDING(S):  Aorta: patent  Superior mesenteric artery: nearly occluded -> resolved after stenting Celiac artery: near occluded -> resolved after stenting  Right Left  RA Patent, nephrogram evident Patent, nephrogram evident  CIA Patent Patent  EIA Patent proximally (incomplete imaging) Patent proximally (incomplete imaging)  IIA Occluded Occluded   SPECIMEN(S):  none  INDICATIONS:   Gary [name] is a 78 y.o. male who presents with chronic mesenteric ischemia with >50 pound weight loss.  Dr. Donnetta Hutching had performed a right femoropopliteal bypass for rest pain in the right leg.  While admitted, Dr. Donnetta Hutching felt he needed an attempt at intervention on his known near occluded mesenteric arteries to prevent death from mesenteric ischemia.  The patient presents for:  Left brachial artery cannulation, aortogram, and possible intervention on his mesenteric arteries.  I discussed with the patient the nature of angiographic procedures, especially the limited patencies of any endovascular intervention.  The patient is aware of that the risks of an angiographic procedure include but are not limited to: bleeding, infection, access site complications, renal failure, embolization, rupture of vessel, dissection, possible need for emergent surgical intervention, possible need for surgical procedures to  treat the patient's pathology, and stroke and death.  The patient is aware of the risks and agrees to proceed.  DESCRIPTION: After full informed consent was obtained from the patient, the patient was brought back to the angiography suite.  The patient was placed supine upon the angiography table and connected to monitoring equipment.  The patient was then given conscious sedation, the amounts of which are documented in the patient's chart.  The patient was prepped and drape in the standard fashion for an angiographic procedure.  At this point, attention was turned to the left antecubitum.  Under ultrasound guidance, the left brachial artery was cannulated with a micropuncture needle.  The microwire was advanced into the left brachial artery.  The needle was exchanged for a microsheath, which was loaded into the left brachial artery over the wire.  The microwire was exchanged for a [facility] wire which was advanced into the aortic arch.  The microsheath was then exchanged for a 6-Fr sheath which was loaded into the left brachial artery.  Using a long pigtail catheter and Benson wire, I cross the axillary and subclavian arteries and got into the descending thoracic aorta.  I then removed the wire and connected the catheter to the power injector.  An anteroposterior and mostly lateral aortogram were completed.  This demonstrated high grade stenosis in both the proximal celiac and superior mesenteric arteries.    The patient was given 5000 units of Heparin intravenously, which was a therapeutic bolus.  In total, 7000 units of Heparin was administrated to achieve and maintain a therapeutic level of anticoagulation. I loaded a 6-Fr multipurpose guide over the wire and advanced into in the supramesenteric segment.  I did some hand injections to  try to identify the orifices for the celiac and superior mesenteric artery.  I loaded a 0.014" [place] wire through the guide and using this, I tried to cannulate the superior  mesenteric artery orifice.  In this process, I cannulated the celiac artery.  I buried the guide into the celiac artery and completed a celiac angiogram.  Based on measurements, I selected a 5 mm x 12 mm stent.  Due to the severe stenosis, I felt it was unlikely to pass the stent without predilation.  I loaded a 5 mm x 12 mm balloon through the guide and inflated the balloon centered on the orifice at 14 atm for 2 minutes.  I then loaded a Hercules 5 mm x 12 mm stent and centered it on the stenosis.  I was deployed at 12 atm for 1 minute.  The stenosis was verified with a significant waist that resolved.  I removed the stent delivery system.  I engaged the guide and did a completion injection: near resolution of stenosis.  I then pulled out the wire and guide.  I advanced the guide distally with some difficulty due to a calcified ridge between the celiac artery and superior mesenteric artery.  Due to the ridge, I was not able to get the wire into the superior mesenteric artery.  I exchanged the wire for a Rosen wire.  The guide was exchanged for a left coronary guide with side holes, which was placed across from the superior mesenteric artery orifice which was obscured due to calcific plaque.  I exchanged the wire for the [place] again.  Using this guide and wire, I was eventually able to get into the superior mesenteric artery.  I engaged the guide into the office and completed a superior mesenteric artery angiogram.  Based on measurements, I selected a 6 mm x 12 mm stent.  I loaded a Hercules stent of this size and centered it on superior mesenteric artery stenosis.  It was deployed at 11 atm for 1 minute.  There was a clear cut waist which resolved.  I removed the stent delivery system and re-engaged the guide into the superior mesenteric artery orifice.  I could not get a clear image, probably due to the side holes present in this guide.  Based on the images, however, I felt confident the superior mesenteric  artery was patent after stenting.  I pulled the wire into the aorta and disengaged the guide.  I exchanged the wire for a Rosen wire and pulled the guide.  I placed a pigtail catheter and pulled the wire.  I connected the catheter to the power injector.  I did mostly lateral injections to verify patency of both the celiac and superior mesenteric arteries.  I replaced the wire in the catheter and pulled both the catheter and wire.    The sheath was aspirated.  No clots were present and the sheath was reloaded with heparinized saline.  There was no brachial hematoma present at this point.  Once the patient's anticoagulation is reversed, the brachial sheath will be removed.    COMPLICATIONS: none  CONDITION: stable  Adele Barthel, MD [facility] Office: [phone] Pager: [phone]  11/19/2013, 3:21 PM

## 2013-11-19 NOTE — Progress Notes (Addendum)
  Progress Note    11/19/2013 7:45 AM 2 Days Post-Op  Subjective:  Has general complaints about the hospital.  States his leg feels fine.  Afebrile HR 50's-70's 130's-160's systolic 99% RA  Filed Vitals:   11/19/13 0444  BP: 136/57  Pulse: 72  Temp: 98.6 F (37 C)  Resp: 18    Physical Exam: Lungs:  Non labored Incisions:  Right groin is c/d/i without hematoma; right below knee incision is c/d/i Extremities:  Right foot is warm with palpable DP  CBC    Component Value Date/Time   WBC 7.8 11/19/2013 0500   RBC 3.62* 11/19/2013 0500   HGB 11.2* 11/19/2013 0500   HCT 32.4* 11/19/2013 0500   PLT 224 11/19/2013 0500   MCV 89.5 11/19/2013 0500   MCH 30.9 11/19/2013 0500   MCHC 34.6 11/19/2013 0500   RDW 13.9 11/19/2013 0500   LYMPHSABS 1.3 05/01/2013 1518   MONOABS 1.1* 05/01/2013 1518   EOSABS 0.0 05/01/2013 1518   BASOSABS 0.1 05/01/2013 1518    BMET    Component Value Date/Time   NA 134* 11/19/2013 0500   K 3.7 11/19/2013 0500   CL 95* 11/19/2013 0500   CO2 26 11/19/2013 0500   GLUCOSE 94 11/19/2013 0500   BUN 16 11/19/2013 0500   CREATININE 0.74 11/19/2013 0500   CREATININE 0.73 07/28/2013 0934   CALCIUM 8.8 11/19/2013 0500   GFRNONAA 87* 11/19/2013 0500   GFRAA >90 11/19/2013 0500    INR    Component Value Date/Time   INR 0.99 11/17/2013 1141     Intake/Output Summary (Last 24 hours) at 11/19/13 0745 Last data filed at 11/19/13 0250  Gross per 24 hour  Intake      0 ml  Output   1050 ml  Net  -1050 ml     Assessment:  78 y.o. male is s/p:  Right external iliac and common femoral endarterectomy and right femoral to below knee popliteal bypass with 6 mm propatent Gore-Tex graft  2 Days Post-Op  Plan: -pt doing well from surgical standpoint with palpable right DP -refusing mesenteric arteriogram today -pt still with foley catheter-will not have anything done until he speaks with Dr. Arbie Cookey -pt needs to mobilize -DVT prophylaxis:   Lovenox   Doreatha Massed, PA-C [facility] [phone] 11/19/2013 7:45 AM    I have examined the patient, reviewed and agree with above.Palp popliteal pulse. Explained that if not able to do angio today will have to schedule later as outpt.  Will keep on schedule for today pending discussion with daughter  Brad Mcgaughy, MD 11/19/2013 8:56 AM

## 2013-11-19 NOTE — Progress Notes (Addendum)
11/19/2013 10:43 AM Nursing note Pt. Daughter [name] at bedside to speak with her father regarding his concerns about the procedure. After speaking with his daughter, the patient states that he has changed his mind and would like to proceed with the planned arteriogram this morning. This conversation was had with patient and RN privately after daughter had left bedside and he states several times that he is agreeable to proceed and would like his pre-medications. Pharmacy notified and pre-meds re-ordered and administered per protocol. Dr. Arbie Cookey paged and made aware. Will continue to monitor patient.  Chimamanda Siegfried, Blanchard Kelch

## 2013-11-20 DIAGNOSIS — I739 Peripheral vascular disease, unspecified: Secondary | ICD-10-CM

## 2013-11-20 LAB — BASIC METABOLIC PANEL
ANION GAP: 13 (ref 5–15)
BUN: 24 mg/dL — ABNORMAL HIGH (ref 6–23)
CALCIUM: 8.4 mg/dL (ref 8.4–10.5)
CO2: 23 meq/L (ref 19–32)
CREATININE: 0.84 mg/dL (ref 0.50–1.35)
Chloride: 99 mEq/L (ref 96–112)
GFR calc Af Amer: >90 mL/min (ref >90)
GFR, EST NON AFRICAN AMERICAN: 83 mL/min — AB (ref >90)
Glucose, Bld: 107 mg/dL — ABNORMAL HIGH (ref 70–99)
Potassium: 3.6 mEq/L — ABNORMAL LOW (ref 3.7–5.3)
SODIUM: 135 meq/L — AB (ref 137–147)

## 2013-11-20 LAB — CBC
HCT: 28 % — ABNORMAL LOW (ref 39.0–52.0)
Hemoglobin: 9.7 g/dL — ABNORMAL LOW (ref 13.0–17.0)
MCH: 30.7 pg (ref 26.0–34.0)
MCHC: 34.6 g/dL (ref 30.0–36.0)
MCV: 88.6 fL (ref 78.0–100.0)
PLATELETS: 214 10*3/uL (ref 150–400)
RBC: 3.16 MIL/uL — ABNORMAL LOW (ref 4.22–5.81)
RDW: 13.8 % (ref 11.5–15.5)
WBC: 6.7 10*3/uL (ref 4.0–10.5)

## 2013-11-20 NOTE — Progress Notes (Signed)
   Daily Progress Note  S/p L brachial access, PTA+S celiac and SMA  No abd complaints this AM.  L brachial site without hematoma or PSA or AVF.  Easily palpable brachial and radial pulse.  Post-proc Cr 0.84.    - discussed with the patient the limited durability of endovascular interventions - pt is aware he will need serial surveillance - Plavix added to rx regimen for the stents  Leonides Sake, MD [facility] [place] Office: [phone] Pager: [phone]  11/20/2013, 8:30 AM

## 2013-11-20 NOTE — Evaluation (Signed)
Physical Therapy Evaluation Patient Details Name: Gary [name] MRN: [id number] DOB: 1935/08/31 Today's Date: 11/20/2013   History of Present Illness  Right external iliac and common femoral endarterectomy and right femoral to below knee popliteal bypass. 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 On 11/19/13,  pt. underwent aortogram, celiac angiogram and angioplasty/stenting of celiac and superior mesenteric arteries due to mesemteric ischemia  Clinical Impression  Pt. Presents to PT this am with a decreased in his usual level of functional mobility .  Currently is at a min guard to supervision level for his PT. He will benefit from acute PT.   If he were inhouse another day or two, I believe he could DC home with intermittant supervision.  He says the doctors have told him to expect to DC "in a day or two".  Will follow and advance him as she can tolerate, working toward DC home.      Follow Up Recommendations No PT follow up;Supervision - Intermittent;Other (comment) (doubl he will need HHPT, depends on progress)    Equipment Recommendations  None recommended by PT    Recommendations for Other Services       Precautions / Restrictions Precautions Precautions: Fall Restrictions Weight Bearing Restrictions: No      Mobility  Bed Mobility Overal bed mobility: Modified Independent             General bed mobility comments: Increased time, HOB up and use of rail  Transfers Overall transfer level: Needs assistance Equipment used: Rolling walker (2 wheeled) Transfers:  Sit to/from Stand Sit to Stand: Min guard         General transfer comment: min guard assist for safety and stability in rising to stand anc controlled dsecent  Ambulation/Gait Ambulation/Gait assistance: Min guard;Supervision Ambulation Distance (Feet): 100 Feet Assistive device: Rolling walker (2 wheeled) Gait Pattern/deviations: Step-through pattern;Decreased stride length;Trunk flexed Gait velocity: decreased   General Gait Details: Pt. with no overt LOB with use of RW; needed min guard assist level , progressinb to supervision level as he progressed in hallway.    Stairs            Wheelchair Mobility    Modified Rankin (Stroke Patients Only)       Balance Overall balance assessment: Needs assistance Sitting-balance support: No upper extremity supported;Feet supported Sitting balance-Leahy Scale: Good     Standing balance support: Single extremity supported Standing balance-Leahy Scale: Poor Standing balance comment: Pt. needed at least one hand support for steadying himself in standing                             Pertinent Vitals/Pain Pain Assessment: No/denies pain    Home Living Family/patient expects to be discharged to:: Private residence Living Arrangements: Children (son who works) Available Help at Discharge: Family;Other (Comment) (son available in the evenings) Type of Home: House Home Access: Stairs to enter Entrance Stairs-Rails: None Entrance Stairs-Number of Steps: 2 Home Layout: One level Home Equipment: Walker - 2 wheels;Cane - single point;Crutches Additional Comments: He lives with son who works during the day    Prior Function Level of  Independence: Independent with assistive device(s)               Hand Dominance        Extremity/Trunk Assessment   Upper Extremity Assessment: Overall WFL for tasks assessed           Lower Extremity Assessment: RLE deficits/detail RLE Deficits / Details: generalized  weakness form surgical procedure; able to achieve zero degree knee extension with quad set    Cervical / Trunk Assessment: Kyphotic  Communication   Communication: No difficulties  Cognition Arousal/Alertness: Awake/alert Behavior During Therapy: WFL for tasks assessed/performed Overall Cognitive Status: Within Functional Limits for tasks assessed                      General Comments      Exercises        Assessment/Plan    PT Assessment Patient needs continued PT services  PT Diagnosis Difficulty walking   PT Problem List Decreased strength;Decreased activity tolerance;Decreased balance;Decreased mobility;Decreased knowledge of use of DME;Decreased knowledge of precautions  PT Treatment Interventions DME instruction;Gait training;Stair training;Functional mobility training;Therapeutic exercise;Balance training;Patient/family education   PT Goals (Current goals can be found in the Care Plan section) Acute Rehab PT Goals Patient Stated Goal: home PT Goal Formulation: With patient Time For Goal Achievement: 12/04/13 Potential to Achieve Goals: Good    Frequency Min 3X/week   Barriers to discharge   son works daytime but her has other children who will be checking in on him per pt.    Co-evaluation               End of Session Equipment Utilized During Treatment: Gait belt Activity Tolerance: Patient limited by fatigue Patient left: in chair;with call bell/phone within reach;with family/visitor present;Other (comment) (brother present ) Nurse Communication: Mobility status         Time: 1047-1110 PT Time Calculation (min): 23 min   Charges:   PT Evaluation $Initial PT Evaluation Tier I: 1 Procedure PT Treatments $Gait Training: 8-22 mins   PT G CodesFerman Hamming 11/20/2013, 11:27 AM Weldon Picking PT Acute Rehab Services [phone] Beeper [phone]

## 2013-11-20 NOTE — Progress Notes (Signed)
VASCULAR LAB PRELIMINARY  ARTERIAL  ABI completed:    RIGHT    LEFT    PRESSURE WAVEFORM  PRESSURE WAVEFORM  BRACHIAL 170  triphasic BRACHIAL 171 triphasic  DP   DP    AT >290 monophasic AT >294 monophasic  PT 83 monophasic PT >293 monophasic  PER   PER    GREAT TOE  adequate GREAT TOE  adequate    RIGHT LEFT  ABI N/A N/A     Jessikah Dicker, RVT 11/20/2013, 5:25 PM

## 2013-11-20 NOTE — Progress Notes (Signed)
Patient declined foley removal.  I explained to patient and family that it must come out by the morning so we can evaluate his ability to void prior to discharge.

## 2013-11-20 NOTE — Progress Notes (Signed)
VASCULAR SURGERY: The nurse asked me to call the patient's family member concerning plans for discharge. The family has concerns about him going home and feels that he may require a short-term skilled nursing facility before it is safe for him to go home. They have a call in to the case manager. The person who can help take care of him at home has cerebral palsy and this is one of their concerns Gary Ferrari, MD, FACS 11/20/2013

## 2013-11-20 NOTE — Progress Notes (Signed)
Patient ID: Gary [name], male   DOB: 1935-05-15, 78 y.o.   MRN: [id number] Patient is sitting up in chair eating breakfast without pain. Reports a better night. Still difficulty with Foley.  Right groin and popliteal incisions healing nicely with 2+ popliteal pulse. Okay to transfer to 2 W. Discharge home once mobilizing comfortably. Appreciate Dr. Imogene Burn this nice work with celiac and SMA angioplasty intervention yesterday

## 2013-11-21 MED ORDER — KETOROLAC TROMETHAMINE 30 MG/ML IJ SOLN
30.0000 mg | Freq: Four times a day (QID) | INTRAMUSCULAR | Status: AC
  Administered 2013-11-21 – 2013-11-24 (×12): 30 mg via INTRAVENOUS
  Filled 2013-11-21 (×14): qty 1

## 2013-11-21 MED ORDER — METOPROLOL SUCCINATE ER 25 MG PO TB24
25.0000 mg | ORAL_TABLET | Freq: Every day | ORAL | Status: DC
  Administered 2013-11-22 – 2013-11-25 (×4): 25 mg via ORAL
  Filled 2013-11-21 (×4): qty 1

## 2013-11-21 NOTE — Progress Notes (Signed)
Occupational Therapy Treatment Patient Details  Name: Gary [name] MRN: [id number]  DOB: [birth year] Today's Date: 11/21/2013  History of present illness  Patient is a 78 y.o. male with a history of hypertension, presenting following sudden onset of obtundation and inability to speak as well as right-sided weakness at 1530 on 10/27/2013. Patient was heard to fall and was seen immediately by his daughter. CT scan of his head showed an acute hemorrhage involving pons and medulla bilaterally. patient required intubation for airway protection and ventilatory support. was extubated 9/23, then developed fever, c-diff and re-intubated 9/27-10/1. OT therapy orders D/C'd on 9/27 and re-ordered 10/8. Last CT on 9/27 showed decrease of dorsal brainstem hemorrhage   OT comments  Hesitant for participation in skilled OT but once initiated demonstrates ability to complete ub/lb adls with min a. Motivation is poor secondary to c/o pain. Will benefit from continued therapy prior to d/c home.   Follow Up Recommendations  SNF;Supervision/Assistance - 24 hour   Equipment Recommendations    Recommendations for Other Services  Rehab consult   Precautions / Restrictions  Precautions  Precautions: Fall  Precaution Comments: swallow precautions; watch BP, contact for c-diff    Mobility  Bed Mobility  Overal bed mobility: Needs Assistance  Bed Mobility: Rolling;Sidelying to Sit;Sit to Supine;Sit to Sidelying  Rolling: Min assist   Supine to sit: Min assist  Sit to supine: Min assist  Sit to sidelying: Min assist  General bed mobility comments: min a for management of b les in/out of bed  Transfers  Overall transfer level: Needs assistance  Equipment used: Rolling walker (2 wheeled)   Sit to Stand: Min guard      General transfer comment: sit/stand multiple times during bath and side stepped to [facility] prior to lying down   Balance                    ADL  Overall ADL's : Needs  assistance/impaired    Grooming: Wash/dry hands;Wash/dry face;Oral care;Applying deodorant;Brushing hair;Minimal assistance;Sitting   Upper Body Bathing: Minimal assitance;Sitting   Lower Body Bathing: Minimal assistance;Sit to/from stand   Upper Body Dressing : Minimal assistance;Sitting   Lower Body Dressing: Minimal assistance;Sit to/from stand          General ADL Comments: pt. able to sit/stand multiple times for donning/doffing underwear and washing peri areas during bath. poor motivation but physically able to complete adls    Vision            Perception    Praxis        Extremity/Trunk Assessment        Exercises    Shoulder Instructions    General Comments    Pertinent Vitals/ Pain Pain Intervention(s): Premedicated before session  Home Living                      Prior Functioning/Environment         Frequency  Min 3X/week   Progress Toward Goals  OT Goals(current goals can now be found in the care plan section)  Progress towards OT goals: Progressing toward goals    Plan  Discharge plan remains appropriate    Tommy Medal, COTA/L

## 2013-11-21 NOTE — Progress Notes (Addendum)
  [facility] Progress Note  11/21/2013 9:42 AM  Subjective:  Having "sharp" pain in right foot "with every pulse." Started last night. Says pain medicine does not work.  Refused to have foley taken out last night.  Agreeable to foley coming out today.   Tmax 98.4 BP sys 120s-170s 02 99% RA   Filed Vitals:   11/21/13 0842  BP: 153/39  Pulse: 89  Temp:   Resp: 18    Physical Exam: Incisions:  Right groin and right medial leg incision healing well.  Extremities:  Feet warm bilaterally. Palpable right dorsalis pedis pulse.   CBC    Component Value Date/Time   WBC 6.7 11/20/2013 0319   RBC 3.16* 11/20/2013 0319   HGB 9.7* 11/20/2013 0319   HCT 28.0* 11/20/2013 0319   PLT 214 11/20/2013 0319   MCV 88.6 11/20/2013 0319   MCH 30.7 11/20/2013 0319   MCHC 34.6 11/20/2013 0319   RDW 13.8 11/20/2013 0319   LYMPHSABS 1.3 05/01/2013 1518   MONOABS 1.1* 05/01/2013 1518   EOSABS 0.0 05/01/2013 1518   BASOSABS 0.1 05/01/2013 1518    BMET    Component Value Date/Time   NA 135* 11/20/2013 0319   K 3.6* 11/20/2013 0319   CL 99 11/20/2013 0319   CO2 23 11/20/2013 0319   GLUCOSE 107* 11/20/2013 0319   BUN 24* 11/20/2013 0319   CREATININE 0.84 11/20/2013 0319   CREATININE 0.73 07/28/2013 0934   CALCIUM 8.4 11/20/2013 0319   GFRNONAA 83* 11/20/2013 0319   GFRAA >90 11/20/2013 0319    INR    Component Value Date/Time   INR 0.99 11/17/2013 1141     Intake/Output Summary (Last 24 hours) at 11/21/13 0942 Last data filed at 11/21/13 0937  Gross per 24 hour  Intake   1395 ml  Output   1651 ml  Net   -256 ml     Assessment:  78 y.o. male is s/p: Right external iliac and common femoral endarterectomy and right femoral to below knee popliteal bypass with 6 mm propatent Gore-Tex graft 3 Days Post-Op  Mesenteric angiogram with stenting of celiac and superior mesenteric arteries 2 Days Post-Op   Plan: -Incisions healing well. Palpable 2+ DP pulse.  -Pain not  well-controlled.  -Get OOB and mobilize. -DVT prophylaxis:  Lovenox -Dispo: PT recommending home with home health. Discharge when pain is better-controlled and ambulating comfortably. Patient's family previously requesting short term SNF. RN spoke with CSW regarding SNF.  Will call family to notify that he is not eligible for SNF.    Maris Berger, PA-C [facility] Office: [phone] Pager: [phone] 11/21/2013 9:42 AM     I agree with the above.  I've seen and examined the patient.  He is complaining of new right heel pain.  His legs are warm.  He has not he secondary to pain.  I will try him on some IV Toradol to see if this gives him some relief.  Durene Cal

## 2013-11-21 NOTE — Progress Notes (Signed)
Physical Therapy Treatment Patient Details Name: Gary [name] MRN: [id number] DOB: 1936-01-24 Today's Date: 11/21/2013    History of Present Illness 78 y.o. male admitted to [facility] on 11/17/13 s/p R external iliac and common femoral endarterectomy and R femoral to below knee popliteal bypass.  Pt with significant PMHx of COPD, HTN, PVD, CAD, h/o ETOH abuse, L fem-pop bypass graft, and foot surgery.     PT Comments    Pt is not progressing as expected with therapy. He is in significantly more pain than last session and is not motivated to move, walk, or get up out of the bed.  I am afraid that if he goes home, and is essentially at home during the day alone, he will not move at all.  I would recommend SNF level rehab for extensive physical therapy before returning home with son (who previous notes indicate he has CP and works during the day).  PT will continue to follow acutely.   Follow Up Recommendations  SNF     Equipment Recommendations  None recommended by PT (pt already owns a RW)    Recommendations for Other Services   NA     Precautions / Restrictions Precautions Precautions: Fall    Mobility  Bed Mobility Overal bed mobility: Needs Assistance Bed Mobility: Sit to Supine       Sit to supine: Min assist   General bed mobility comments: Min assist to get his right leg up into the bed.   Transfers Overall transfer level: Needs assistance Equipment used: Rolling walker (2 wheeled) Transfers: Sit to/from Stand Sit to Stand: Min assist         General transfer comment: Min assit to get to standing from lower recliner chair with heavy reliance on upper extremities for transitions.  Pt is slow to move over flexed knees.   Ambulation/Gait Ambulation/Gait assistance: Min guard Ambulation Distance (Feet): 100 Feet Assistive device: Rolling walker (2 wheeled) Gait Pattern/deviations: Step-through pattern;Shuffle;Trunk flexed Gait velocity: decreased Gait velocity  interpretation: <1.8 ft/sec, indicative of risk for recurrent falls General Gait Details: Pt with very slow gait pattern, head and neck flexed forward and looking at feet.  Cues to stay closer/inside of RW for support of his arms to help unweight his painful right leg.  Verbal cues for upright posture, but pt can only hold breifly.  Pt's gait speed is significantly slower than 1.8 ft/sec putting him at significant risk for falls.        Balance Overall balance assessment: Needs assistance Sitting-balance support: Feet supported;No upper extremity supported Sitting balance-Leahy Scale: Fair Sitting balance - Comments: pt unable to reach to his feet  Postural control: Other (comment) (flexed, kyphotic posture in sitting. ) Standing balance support: Bilateral upper extremity supported Standing balance-Leahy Scale: Poor Standing balance comment: needs external assist in standing                    Cognition Arousal/Alertness: Awake/alert Behavior During Therapy: WFL for tasks assessed/performed Overall Cognitive Status: Within Functional Limits for tasks assessed                      Exercises General Exercises - Lower Extremity Ankle Circles/Pumps: AAROM;Right Long Arc Quad: AAROM;Right;10 reps Hip Flexion/Marching: AAROM;Right;10 reps        Pertinent Vitals/Pain Pain Assessment: Faces Faces Pain Scale: Hurts worst Pain Location: right leg Pain Descriptors / Indicators: Aching;Burning;Constant Pain Intervention(s): Limited activity within patient's tolerance;Monitored during session;Repositioned;Patient requesting pain meds-RN notified  PT Goals (current goals can now be found in the care plan section) Acute Rehab PT Goals Patient Stated Goal: decrease pain Progress towards PT goals: Not progressing toward goals - comment (limited by increased pain today compared to last session)    Frequency  Min 3X/week    PT Plan Discharge plan needs to be  updated       End of Session Equipment Utilized During Treatment: Gait belt Activity Tolerance: Patient limited by pain;Patient limited by fatigue Patient left: in bed;with call bell/phone within reach;with bed alarm set     Time: 1356-1456 PT Time Calculation (min): 60 min  Charges:  $Gait Training: 23-37 mins $Therapeutic Exercise: 8-22 mins $Therapeutic Activity: 8-22 mins                      Durwin Davisson B. Nychelle Cassata, PT, DPT [id number]#[phone]   11/21/2013, 3:10 PM

## 2013-11-22 LAB — CBC
HCT: 27.5 % — ABNORMAL LOW (ref 39.0–52.0)
Hemoglobin: 9.4 g/dL — ABNORMAL LOW (ref 13.0–17.0)
MCH: 30.9 pg (ref 26.0–34.0)
MCHC: 34.2 g/dL (ref 30.0–36.0)
MCV: 90.5 fL (ref 78.0–100.0)
PLATELETS: 243 10*3/uL (ref 150–400)
RBC: 3.04 MIL/uL — ABNORMAL LOW (ref 4.22–5.81)
RDW: 14 % (ref 11.5–15.5)
WBC: 6 10*3/uL (ref 4.0–10.5)

## 2013-11-22 LAB — BASIC METABOLIC PANEL
ANION GAP: 15 (ref 5–15)
BUN: 23 mg/dL (ref 6–23)
CO2: 21 mEq/L (ref 19–32)
CREATININE: 0.85 mg/dL (ref 0.50–1.35)
Calcium: 8.4 mg/dL (ref 8.4–10.5)
Chloride: 101 mEq/L (ref 96–112)
GFR calc non Af Amer: 83 mL/min — ABNORMAL LOW (ref >90)
Glucose, Bld: 72 mg/dL (ref 70–99)
POTASSIUM: 3.6 meq/L — AB (ref 3.7–5.3)
Sodium: 137 mEq/L (ref 137–147)

## 2013-11-22 NOTE — Progress Notes (Signed)
    Subjective  - POD #4  Pain was better yesterday.  He is very pleasant today.  He has been able to tolerate a diet.   Physical Exam:  Good Doppler signals in his foot Incisions are healing       Assessment/Plan:  POD #4  The patient appears to be doing well today with the addition of Toradol  for his pain management.  His creatinine has remained stable overnight .  He has tolerated a diet so far.  We worked with physical therapy regarding ambulation.  Natsha Guidry IV, V. WELLS 11/22/2013 11:13 AM --  Filed Vitals:   11/22/13 1029  BP:   Pulse: 72  Temp:   Resp:     Intake/Output Summary (Last 24 hours) at 11/22/13 1113 Last data filed at 11/22/13 1030  Gross per 24 hour  Intake 845.83 ml  Output    550 ml  Net 295.83 ml     Laboratory CBC    Component Value Date/Time   WBC 6.0 11/22/2013 0506   HGB 9.4* 11/22/2013 0506   HCT 27.5* 11/22/2013 0506   PLT 243 11/22/2013 0506    BMET    Component Value Date/Time   NA 137 11/22/2013 0506   K 3.6* 11/22/2013 0506   CL 101 11/22/2013 0506   CO2 21 11/22/2013 0506   GLUCOSE 72 11/22/2013 0506   BUN 23 11/22/2013 0506   CREATININE 0.85 11/22/2013 0506   CREATININE 0.73 07/28/2013 0934   CALCIUM 8.4 11/22/2013 0506   GFRNONAA 83* 11/22/2013 0506   GFRAA >90 11/22/2013 0506    COAG Lab Results  Component Value Date   INR 0.99 11/17/2013   INR 0.99 07/28/2013   INR 0.94 06/25/2012   No results found for this basename: PTT    Antibiotics Anti-infectives   Start     Dose/Rate Route Frequency Ordered Stop   11/19/13 0900  ceFAZolin (ANCEF) IVPB 1 g/50 mL premix  Status:  Discontinued     1 g 100 mL/hr over 30 Minutes Intravenous  Once 11/18/13 1229 11/19/13 1804   11/18/13 0100  cefUROXime (ZINACEF) 1.5 g in dextrose 5 % 50 mL IVPB     1.5 g 100 mL/hr over 30 Minutes Intravenous Every 12 hours 11/17/13 1716 11/18/13 1417   11/16/13 1259  cefUROXime (ZINACEF) 1.5 g in dextrose 5 % 50 mL IVPB     1.5  g 100 mL/hr over 30 Minutes Intravenous 30 min pre-op 11/16/13 1259 11/17/13 1300       V. Charlena Cross, M.D. [facility] [place] Office: [phone] Pager:  [phone]

## 2013-11-23 NOTE — Progress Notes (Signed)
Ambulated 550 ft. Gary [name] 9:28 AM

## 2013-11-23 NOTE — Progress Notes (Signed)
    Subjective  - POD #5  Did not sleep well last night. Says his feet did not bother him   Physical Exam:  Incisions healing nicely  Foot is warm and well perfused     Assessment/Plan:  POD #5  Continues to improve.  Pain significantly better.  We'll make plans for discharge tomorrow.  The daughter would like to be involved in these decisions that she is reluctant for him to go home.  I will have social work evaluate him tomorrow  Myra Gianotti IV, Lala Lund 11/23/2013 8:01 AM --  Filed Vitals:   11/23/13 0433  BP: 140/60  Pulse: 63  Temp: 98.3 F (36.8 C)  Resp: 18    Intake/Output Summary (Last 24 hours) at 11/23/13 0801 Last data filed at 11/23/13 0600  Gross per 24 hour  Intake 2944.17 ml  Output    650 ml  Net 2294.17 ml     Laboratory CBC    Component Value Date/Time   WBC 6.0 11/22/2013 0506   HGB 9.4* 11/22/2013 0506   HCT 27.5* 11/22/2013 0506   PLT 243 11/22/2013 0506    BMET    Component Value Date/Time   NA 137 11/22/2013 0506   K 3.6* 11/22/2013 0506   CL 101 11/22/2013 0506   CO2 21 11/22/2013 0506   GLUCOSE 72 11/22/2013 0506   BUN 23 11/22/2013 0506   CREATININE 0.85 11/22/2013 0506   CREATININE 0.73 07/28/2013 0934   CALCIUM 8.4 11/22/2013 0506   GFRNONAA 83* 11/22/2013 0506   GFRAA >90 11/22/2013 0506    COAG Lab Results  Component Value Date   INR 0.99 11/17/2013   INR 0.99 07/28/2013   INR 0.94 06/25/2012   No results found for this basename: PTT    Antibiotics Anti-infectives   Start     Dose/Rate Route Frequency Ordered Stop   11/19/13 0900  ceFAZolin (ANCEF) IVPB 1 g/50 mL premix  Status:  Discontinued     1 g 100 mL/hr over 30 Minutes Intravenous  Once 11/18/13 1229 11/19/13 1804   11/18/13 0100  cefUROXime (ZINACEF) 1.5 g in dextrose 5 % 50 mL IVPB     1.5 g 100 mL/hr over 30 Minutes Intravenous Every 12 hours 11/17/13 1716 11/18/13 1417   11/16/13 1259  cefUROXime (ZINACEF) 1.5 g in dextrose 5 % 50 mL IVPB     1.5 g 100 mL/hr over 30 Minutes Intravenous 30 min pre-op 11/16/13 1259 11/17/13 1300       V. Charlena Cross, M.D. [facility] [place] Office: [phone] Pager:  [phone]

## 2013-11-24 LAB — CREATININE, SERUM
CREATININE: 0.69 mg/dL (ref 0.50–1.35)
GFR calc Af Amer: >90 mL/min (ref >90)
GFR calc non Af Amer: 90 mL/min — ABNORMAL LOW (ref >90)

## 2013-11-24 MED ORDER — BISACODYL 5 MG PO TBEC
5.0000 mg | DELAYED_RELEASE_TABLET | Freq: Once | ORAL | Status: AC
  Administered 2013-11-24: 5 mg via ORAL
  Filled 2013-11-24: qty 1

## 2013-11-24 NOTE — Progress Notes (Signed)
Physical Therapy Treatment Patient Details Name: Gary [name] MRN: [id number] DOB: [birth year] Today's Date: 11/24/2013    History of Present Illness 78 y.o. male admitted to [facility] on 11/17/13 s/p R external iliac and common femoral endarterectomy and R femoral to below knee popliteal bypass.  Pt with significant PMHx of COPD, HTN, PVD, CAD, h/o ETOH abuse, L fem-pop bypass graft, and foot surgery.     PT Comments    Pt admitted with above. Pt currently with functional limitations due to balance and endurance deficits.  Pt will benefit from skilled PT to increase their independence and safety with mobility to allow discharge to the venue listed below.   Follow Up Recommendations  SNF     Equipment Recommendations  None recommended by PT    Recommendations for Other Services       Precautions / Restrictions Precautions Precautions: Fall Restrictions Weight Bearing Restrictions: No    Mobility  Bed Mobility Overal bed mobility: Needs Assistance Bed Mobility: Supine to Sit     Supine to sit: Min assist     General bed mobility comments: Min assist to get his right leg up into the bed.   Transfers Overall transfer level: Needs assistance Equipment used: Rolling walker (2 wheeled) Transfers: Sit to/from Stand Sit to Stand: Min guard         General transfer comment: cues for hand placement  Ambulation/Gait Ambulation/Gait assistance: Min guard Ambulation Distance (Feet): 400 Feet Assistive device: Rolling walker (2 wheeled) Gait Pattern/deviations: Step-through pattern;Decreased stride length;Trunk flexed Gait velocity: decreased Gait velocity interpretation: <1.8 ft/sec, indicative of risk for recurrent falls General Gait Details: Pt with very slow gait pattern, head and neck flexed forward and looking at feet.  Cues to stay closer/inside of RW for support of his arms.  Verbal cues for upright posture, but pt can only hold breifly.  Pt's gait speed is  significantly slower than 1.8 ft/sec putting him at significant risk for falls.    Stairs            Wheelchair Mobility    Modified Rankin (Stroke Patients Only)       Balance           Standing balance support: Bilateral upper extremity supported;During functional activity Standing balance-Leahy Scale: Poor Standing balance comment: needs RW for support in standing.                     Cognition Arousal/Alertness: Awake/alert Behavior During Therapy: WFL for tasks assessed/performed Overall Cognitive Status: Within Functional Limits for tasks assessed                      Exercises General Exercises - Lower Extremity Long Arc Quad: AROM;Both;5 reps;Seated Hip Flexion/Marching: AROM;Both;5 reps;Seated    General Comments        Pertinent Vitals/Pain Pain Assessment: Faces Faces Pain Scale: Hurts worst Pain Location: right leg Pain Descriptors / Indicators: Aching;Burning;Constant Pain Intervention(s): Limited activity within patient's tolerance;Monitored during session;Repositioned;Patient requesting pain meds-RN notified VSS    Home Living                      Prior Function            PT Goals (current goals can now be found in the care plan section) Progress towards PT goals: Progressing toward goals    Frequency  Min 3X/week    PT Plan Current plan remains appropriate    Co-evaluation  End of Session Equipment Utilized During Treatment: Gait belt Activity Tolerance: Patient limited by fatigue Patient left: in bed;with call bell/phone within reach;with bed alarm set     Time: [id number] PT Time Calculation (min): 16 min  Charges:  $Gait Training: 8-22 mins                    G Codes:      Gary [name], Gary [name] 11/24/2013, 4:32 PM Enter[employer] CorporationDawn Solmon [name],PT Acute Rehabilitation [phone] (pager)

## 2013-11-24 NOTE — Progress Notes (Addendum)
Progress Note    11/24/2013 7:25 AM 5 Days Post-Op  Subjective:  "I'm having gas pains" "My right foot hurts a little bit"  Afebrile VSS 99% RA  Filed Vitals:   11/24/13 0429  BP: 143/64  Pulse: 69  Temp: 98.5 F (36.9 C)  Resp: 18    Physical Exam: Lungs:  Non labored Incisions:  C/d/i Extremities:  Right foot is warm and well perfused with 2+ palpable right DP.  Some swelling around medial malleolus. Abdomen:  Soft; NT/ND; +BM 2 nights ago  CBC    Component Value Date/Time   WBC 6.0 11/22/2013 0506   RBC 3.04* 11/22/2013 0506   HGB 9.4* 11/22/2013 0506   HCT 27.5* 11/22/2013 0506   PLT 243 11/22/2013 0506   MCV 90.5 11/22/2013 0506   MCH 30.9 11/22/2013 0506   MCHC 34.2 11/22/2013 0506   RDW 14.0 11/22/2013 0506   LYMPHSABS 1.3 05/01/2013 1518   MONOABS 1.1* 05/01/2013 1518   EOSABS 0.0 05/01/2013 1518   BASOSABS 0.1 05/01/2013 1518    BMET    Component Value Date/Time   NA 137 11/22/2013 0506   K 3.6* 11/22/2013 0506   CL 101 11/22/2013 0506   CO2 21 11/22/2013 0506   GLUCOSE 72 11/22/2013 0506   BUN 23 11/22/2013 0506   CREATININE 0.69 11/24/2013 0350   CREATININE 0.73 07/28/2013 0934   CALCIUM 8.4 11/22/2013 0506   GFRNONAA 90* 11/24/2013 0350   GFRAA >90 11/24/2013 0350    INR    Component Value Date/Time   INR 0.99 11/17/2013 1141     Intake/Output Summary (Last 24 hours) at 11/24/13 0725 Last data filed at 11/24/13 0206  Gross per 24 hour  Intake    360 ml  Output   1125 ml  Net   -765 ml   ABI's 11/20/13:  RIGHT    LEFT     PRESSURE  WAVEFORM   PRESSURE  WAVEFORM   BRACHIAL  170  triphasic  BRACHIAL  171  triphasic   DP    DP     AT  >290  monophasic  AT  >294  monophasic   PT  83  monophasic  PT  >293  monophasic   PER    PER     GREAT TOE   adequate  GREAT TOE   adequate     RIGHT  LEFT   ABI  N/A  N/A      Assessment:  78 y.o. male is s/p:  -Right external iliac and common femoral endarterectomy and right femoral to  below knee popliteal bypass with 6 mm propatent Gore-Tex grafr  7 Days Post-Op And 1. Left brachial artery cannulation under ultrasound guidance  2. Third order arterial selection  3. Aortogram  4. Selective cannulation of celiac artery  5. Celiac angiogram  6. Angioplasty and stenting of celiac artery (5 mm x 12 mm)  7. Selective cannulation of superior mesenteric artery  8. Angioplasty and stenting of superior mesenteric artery (6 mm x 12 mm) 5 Days Post-Op   Plan: -right LE bypass graft is patent with right foot 2+ palpable DP -tolerating diet -will give pt oral dulcolax (he would rather not have suppository) -DVT prophylaxis:  Lovenox -continue to increase mobilization -have ordered social work consult as Dr. Estanislado SpireBrabham's note states his pt does not think he is ready to go home and she would like to be part of the decision making process.   Doreatha MassedSamantha Rhyne, PA-C [facility] [phone] 11/24/2013 7:25 AM   Addendum  I have independently interviewed and examined the patient, and I agree with the physician assistant's findings.  All incision c/d/i, R foot is viable.  Abd sx resolved.  No signs of any cannulation complications in the left groin.  Pt awaiting dispo with social work consult prior to discharge soon.  Leonides Sake, MD [facility] [place] Office: [phone] Pager: [phone]  11/24/2013, 11:00 AM

## 2013-11-24 NOTE — Clinical Social Work Psychosocial (Addendum)
Clinical Social Work Department BRIEF PSYCHOSOCIAL ASSESSMENT 11/24/2013  Patient:  Gary [name],Gary [name]     Account Number:  [id number]     Admit date:  11/17/2013  Clinical Social Worker:  Elouise Munroe  Date/Time:  11/24/2013 12:37 PM  Referred by:  Physician  Date Referred:  11/23/2013 Referred for  SNF Placement   Other Referral:   Interview type:  Patient Other interview type:    PSYCHOSOCIAL DATA Living Status:  WITH ADULT CHILDREN Admitted from facility:   Level of care:   Primary support name:   Primary support relationship to patient:  FAMILY Degree of support available:   Son lives with patient, but works full time.  Patient's daughter is the POA and is available to help with medical decisions.    CURRENT CONCERNS Current Concerns  Post-Acute Placement   Other Concerns:    SOCIAL WORK ASSESSMENT / PLAN Patient is a pleasant 78 year old male who lives with his son.  Patient's son works, so patient is by himself most of the day.  Patient is in agreement to having rehab at a SNF. Patient is alert and oriented times 3.  Patient would like to gain his strength back so he can return home with his son.   Assessment/plan status:  Other - See comment Other assessment/ plan:   Patient is in agreement to attend SNF for rehab, plan is to then return home with home health per doctor recommendations.   Information/referral to community resources:    PATIENT'S/FAMILY'S RESPONSE TO PLAN OF CARE: Patient and family in agreement to SNF for short term rehab.   Gary [name]. Gary [name], MSW, [name] [phone] 11/24/2013 1:06 PM

## 2013-11-24 NOTE — Clinical Social Work Placement (Signed)
Clinical Social Work Department CLINICAL SOCIAL WORK PLACEMENT NOTE 11/24/2013  Patient:  Gary [name],Gary [name]  Account Number:  [id number] Admit date:  11/17/2013  Clinical Social Worker:  Toma Arts, LCSWA  Date/time:  11/24/2013 01:08 PM  Clinical Social Work is seeking post-discharge placement for this patient at the following level of care:   SKILLED NURSING   (*CSW will update this form in Epic as items are completed)   11/24/2013  Patient/family provided with [facility] Department of Clinical Social Work's list of facilities offering this level of care within the geographic area requested by the patient (or if unable, by the patient's family).  11/24/2013  Patient/family informed of their freedom to choose among providers that offer the needed level of care, that participate in Medicare, Medicaid or managed care program needed by the patient, have an available bed and are willing to accept the patient.  11/24/2013  Patient/family informed of [facility]' ownership interest in [facility], as well as of the fact that they are under no obligation to receive care at this facility.  PASARR submitted to EDS on 11/23/2013 PASARR number received on 11/23/2013  FL2 transmitted to all facilities in geographic area requested by pt/family on  11/24/2013 FL2 transmitted to all facilities within larger geographic area on 11/24/2013  Patient informed that his/her managed care company has contracts with or will negotiate with  certain facilities, including the following:     Patient/family informed of bed offers received:   Patient chooses bed at  Physician recommends and patient chooses bed at    Patient to be transferred to  on   Patient to be transferred to facility by  Patient and family notified of transfer on  Name of family member notified:    The following physician request were entered in Epic:   Additional Comments:   Colm Lyford R. Kadon Andrus, MSW,  LCSWA [phone] 11/24/2013 1:26 PM

## 2013-11-25 ENCOUNTER — Telehealth: Payer: Self-pay | Admitting: Vascular Surgery

## 2013-11-25 MED ORDER — OXYCODONE-ACETAMINOPHEN 5-325 MG PO TABS: 1.0000 | ORAL_TABLET | Freq: Four times a day (QID) | ORAL | Status: DC | PRN

## 2013-11-25 MED ORDER — CLOPIDOGREL BISULFATE 75 MG PO TABS: 75.0000 mg | ORAL_TABLET | Freq: Every day | ORAL | Status: DC

## 2013-11-25 NOTE — Progress Notes (Signed)
Patient for DC to [facility], Reviewed dc instructions with patients daughter and given a copy, questions answered. Daughter to provide transportation to snf, paperwork to daughter Georgette Dover

## 2013-11-25 NOTE — Discharge Summary (Signed)
[facility] Discharge Summary   Patient ID:  Gary [name] MRN: [id number] DOB/AGE: 1935/11/25 78 y.o.  Admit date: 11/17/2013 Discharge date: 11/25/2013 Date of Surgery: 11/17/2013 - 11/19/2013 Surgeon: Moishe Spice): Fransisco Hertz, MD  Admission Diagnosis: severe chronic mesenteric ischemia  critical right limb ischemia    Discharge Diagnoses:  severe chronic mesenteric ischemia  critical right limb ischemia   Secondary Diagnoses: Past Medical History  Diagnosis Date  . 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 Hypertension   . PVD (peripheral vascular disease) 04/27/2011    normal renal & abdominal aortography; s/p Left Fem-Pop BPG 2004.  Marland Kitchen Dyslipidemia   . Coronary artery disease 04/27/2011    Low risk Myoview in 2010:Non-obstructive disase, except for small OMB 95% ostial lesion  . H/O ETOH abuse   . GERD (gastroesophageal reflux disease)   . Alcohol abuse   . Carotid artery disease   . Pneumonia     Procedure(s): VISCERAL ANGIOGRAM PERCUTANEOUS STENT INTERVENTION PERCUTANEOUS STENT INTERVENTION  Discharged Condition: good  HPI: 78 y/o male who has had progressive rest pain in his right foot. He was asked to come to our office this morning for further evaluation. He reports that over the past week to 10 days he has had the worsening rest pain in his right foot. This is constant and intolerable for him.  He has a very complex patient. This is well documented with last visit with him on 10/14/2013. He has critical right carotid stenosis which fortunately remains asymptomatic. He has critical right limb ischemia and also has severe chronic mesenteric ischemia with a greater than 40 pound weight loss over the last 6 months. We have scheduled him for mesenteric arteriogram     Hospital Course:  Gary [name] is a 78 y.o. male is S/P Right Right external iliac and common femoral endarterectomy and  right femoral to below knee popliteal bypass with 6 mm propatent Gore-Tex graft 11/18/2013. PROCEDURE:  1. Left brachial artery cannulation under ultrasound guidance  2. Third order arterial selection  3. Aortogram  4. Selective cannulation of celiac artery  5. Celiac angiogram  6. Angioplasty and stenting of celiac artery (5 mm x 12 mm)  7. Selective cannulation of superior mesenteric artery  8. Angioplasty and stenting of superior mesenteric artery (6 mm x 12 mm) 11/19/2013 by Dr. Imogene Burn.  Plavix added to rx regimen for the stents.  Difficulty with the pain control, voiding and mobility.  Consulted social work for discharge planning SNF placement.  He will be discharged to SNF today and follow up with Dr. Arbie Cookey in 2 weeks.       Significant Diagnostic Studies: CBC Lab Results  Component Value Date   WBC 6.0 11/22/2013   HGB 9.4* 11/22/2013   HCT 27.5* 11/22/2013   MCV 90.5 11/22/2013   PLT 243 11/22/2013    BMET    Component Value Date/Time   NA 137 11/22/2013 0506   K 3.6* 11/22/2013 0506   CL 101 11/22/2013 0506   CO2 21 11/22/2013 0506   GLUCOSE 72 11/22/2013 0506   BUN 23 11/22/2013 0506   CREATININE 0.69 11/24/2013 0350   CREATININE 0.73 07/28/2013 0934   CALCIUM 8.4 11/22/2013 0506   GFRNONAA 90* 11/24/2013 0350   GFRAA >90 11/24/2013 0350   COAG Lab Results  Component Value Date   INR 0.99 11/17/2013   INR 0.99 07/28/2013   INR 0.94 06/25/2012  Disposition:  Discharge to :Skilled nursing facility Discharge Instructions   Call MD for:  redness, tenderness, or signs of infection (pain, swelling, bleeding, redness, odor or green/yellow discharge around incision site)    Complete by:  As directed      Call MD for:  severe or increased pain, loss or decreased feeling  in affected limb(s)    Complete by:  As directed      Call MD for:  temperature >100.5    Complete by:  As directed      Discharge instructions    Complete by:  As directed   He may shower  daily with soap and water.     Increase activity slowly    Complete by:  As directed   Walk with assistance use walker or cane as needed     Resume previous diet    Complete by:  As directed             Medication List         albuterol 108 (90 BASE) MCG/ACT inhaler  Commonly known as:  PROVENTIL HFA;VENTOLIN HFA  Inhale 2 puffs into the lungs every 4 (four) hours as needed for wheezing.     amLODipine 5 MG tablet  Commonly known as:  NORVASC  Take 1 tablet (5 mg total) by mouth daily.     aspirin EC 81 MG tablet  Take 81 mg by mouth at bedtime.     atorvastatin 10 MG tablet  Commonly known as:  LIPITOR  Take 1 tablet (10 mg total) by mouth daily.     chlorthalidone 25 MG tablet  Commonly known as:  HYGROTON  Take 25 mg by mouth daily.     Choline Fenofibrate 135 MG capsule  Take 135 mg by mouth daily.     clopidogrel 75 MG tablet  Commonly known as:  PLAVIX  Take 1 tablet (75 mg total) by mouth daily.     co-enzyme Q-10 30 MG capsule  Take 30 mg by mouth daily.     folic acid 1 MG tablet  Commonly known as:  FOLVITE  Take 1 mg by mouth daily.     hydrALAZINE 50 MG tablet  Commonly known as:  APRESOLINE  Take 1.5 tablets (75 mg total) by mouth 3 (three) times daily.     losartan 100 MG tablet  Commonly known as:  COZAAR  Take 1 tablet (100 mg total) by mouth daily.     metoprolol succinate 25 MG 24 hr tablet  Commonly known as:  TOPROL-XL  Take 1 tablet (25 mg total) by mouth daily.     oxyCODONE-acetaminophen 5-325 MG per tablet  Commonly known as:  PERCOCET/ROXICET  Take 1 tablet by mouth every 6 (six) hours as needed.     pantoprazole 40 MG tablet  Commonly known as:  PROTONIX  Take 40 mg by mouth daily.     pseudoephedrine 60 MG tablet  Commonly known as:  SUDAFED  Take 60 mg by mouth every 4 (four) hours as needed for congestion.     vitamin B-12 100 MCG tablet  Commonly known as:  CYANOCOBALAMIN  Take 100 mcg by mouth daily.        Verbal and written Discharge instructions given to the patient. Wound care per Discharge AVS     Follow-up Information   Follow up with [facility]. (Designer, jewelleryegistered Nurse, Physical Therapy Services to start within 24-48 hours of hospital discharge)    Contact information:  [address] [place] [address] [phone]       Follow up with Nicki Reaper, NP. (PCP)    Specialty:  Internal Medicine   Contact information:   [address] [place] [address] [phone]       Follow up with EARLY, TODD, MD In 2 weeks. (sent message to office)    Specialty:  Vascular Surgery   Contact information:   [address] [place] [address] [phone]       Signed: Clinton Gallant [facility] 11/25/2013, 7:54 AM  - For VQI Registry use --- Instructions: Press F2 to tab through selections.  Delete question if not applicable.   Post-op:  Wound infection: No  Graft infection: No  Transfusion: No  If yes, 0 units given New Arrhythmia: No Ipsilateral amputation: [x ] no, [ ]  Minor, [ ]  BKA, [ ]  AKA Discharge patency: [ x] Primary, [ ]  Primary assisted, [ ]  Secondary, [ ]  Occluded Patency judged by: [x ] Dopper only, [ ]  Palpable graft pulse, [ ]  Palpable distal pulse, [ ]  ABI inc. > 0.15, [ ]  Duplex Discharge ABI: R monophasic, L monophasic  D/C Ambulatory Status: Ambulatory with Assistance  Complications: MI: [x ] No, [ ]  Troponin only, [ ]  EKG or Clinical CHF: No Resp failure: [x ] none, [ ]  Pneumonia, [ ]  Ventilator Chg in renal function: [x ] none, [ ]  Inc. Cr > 0.5, [ ]  Temp. Dialysis, [ ]  Permanent dialysis Stroke: [x ] None, [ ]  Minor, [ ]  Major Return to OR: No  Reason for return to OR: [ ]  Bleeding, [ ]  Infection, [ ]  Thrombosis, [ ]  Revision  Discharge medications: Statin use:  yes ASA use:  Yes Plavix use:  Yes Beta blocker use: Yes Coumadin use: No  for medical reason

## 2013-11-25 NOTE — Clinical Social Work Note (Signed)
Patient to be d/c'ed today to [facility].  Patient and family agreeable to plans will transport via daughter's personal vehicle, RN to call report. Windell Moulding, MSW, [name] [phone]

## 2013-11-25 NOTE — Progress Notes (Signed)
    Subjective  - Eating, and ambulating with rolling walker and assistance with balance and endurance.       Objective 138/62 67 98.7 F (37.1 C) (Oral) 18 99%  Intake/Output Summary (Last 24 hours) at 11/25/13 0737 Last data filed at 11/24/13 1500  Gross per 24 hour  Intake    600 ml  Output      0 ml  Net    600 ml    Doppler signals DP/PT Incision healing well without hematoma Sensation grossly intact bilateral Min-mod right lower extremity and foot  Assessment/Planning: POD #8 Right external iliac and common femoral endarterectomy and right femoral to below knee popliteal bypass with 6 mm propatent Gore-Tex grafr   1. Left brachial artery cannulation under ultrasound guidance  2. Third order arterial selection  3. Aortogram  4. Selective cannulation of celiac artery  5. Celiac angiogram  6. Angioplasty and stenting of celiac artery (5 mm x 12 mm)  7. Selective cannulation of superior mesenteric artery  8. Angioplasty and stenting of superior mesenteric artery (6 mm x 12 mm)  6 Days Post-Op  Possible discharge today to SNF.  He is taking PO's well and has had one BM.  Taking PO pain medications.   2 weeks with Dr. Fayne Mediate, [facility] 11/25/2013 7:37 AM --  Laboratory Lab Results: No results found for this basename: WBC, HGB, HCT, PLT,  in the last 72 hours BMET  Recent Labs  11/24/13 0350  CREATININE 0.69    COAG Lab Results  Component Value Date   INR 0.99 11/17/2013   INR 0.99 07/28/2013   INR 0.94 06/25/2012   No results found for this basename: PTT

## 2013-11-25 NOTE — Telephone Encounter (Addendum)
Message copied by Rosalyn Charters on Tue Nov 25, 2013 11:22 AM ------      Message from: Lorin Mercy K      Created: Tue Nov 25, 2013 10:42 AM      Regarding: Schedule       2 weeks according to her discharge summary.             ----- Message -----         From: Lars Mage, PA-C         Sent: 11/25/2013   7:44 AM           To: [facility] Charge Pool            F/U with Dr. Arbie Cookey s/p Fem endarterectomy/by-pass and angioplasty right.  SMA stenting.Marland KitchenMarland KitchenContact Daughter and [facility] place for follow up please [phone].       ------  notified patient's daughter of post op appt. on 12-09-13  10 am

## 2013-11-26 ENCOUNTER — Encounter: Payer: Self-pay | Admitting: Adult Health

## 2013-11-26 ENCOUNTER — Non-Acute Institutional Stay (SKILLED_NURSING_FACILITY): Payer: Medicare Other | Admitting: Adult Health

## 2013-11-26 DIAGNOSIS — E785 Hyperlipidemia, unspecified: Secondary | ICD-10-CM | POA: Diagnosis not present

## 2013-11-26 DIAGNOSIS — D62 Acute posthemorrhagic anemia: Secondary | ICD-10-CM

## 2013-11-26 DIAGNOSIS — I70229 Atherosclerosis of native arteries of extremities with rest pain, unspecified extremity: Secondary | ICD-10-CM

## 2013-11-26 DIAGNOSIS — I998 Other disorder of circulatory system: Secondary | ICD-10-CM

## 2013-11-26 DIAGNOSIS — K219 Gastro-esophageal reflux disease without esophagitis: Secondary | ICD-10-CM

## 2013-11-26 DIAGNOSIS — I10 Essential (primary) hypertension: Secondary | ICD-10-CM | POA: Diagnosis not present

## 2013-11-26 NOTE — Progress Notes (Signed)
Patient ID: Gary [name], male   DOB: [birth year], 78 y.o.   MRN: [id number]   11/26/2013  Facility:  Nursing Home Location:  [facility] Room Number: 808-P LEVEL OF CARE:  SNF (31)    Chief Complaint  Patient presents with  . New Admit To SNF    HISTORY OF PRESENT ILLNESS:  This is a 78 year old male who has been admitted to [facility] on 11/25/13 from [facility] with Critical Limb Ischemia S/P Right extrenal iliac and common femoral endarterectomy and right femoral below knee popliteal bypass graft. He has been admitted for a short-term rehabilitation.  REASSESSMENT OF ONGOING PROBLEMS:  HTN: Pt 's HTN remains stable.  Denies CP, sob, DOE, headaches, dizziness or visual disturbances.  No complications from the medications currently being used.  Last BP :  131/56  ANEMIA: The anemia has been stable. The patient denies fatigue, melena or hematochezia. No complications from the medications currently being used. 10/15 hgb 9.4  GERD: pt's GERD is stable.  Denies ongoing heartburn, abd. Pain, nausea or vomiting.  Currently on a PPI & tolerates it without any adverse reactions.   PAST MEDICAL HISTORY:  Past Medical History  Diagnosis Date  . 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 Hypertension   . PVD (peripheral vascular disease) 04/27/2011    normal renal & abdominal aortography; s/p Left Fem-Pop BPG 2004.  Marland Kitchen Dyslipidemia   . Coronary artery disease 04/27/2011    Low risk Myoview in 2010:Non-obstructive disase, except for small OMB 95% ostial lesion  . H/O ETOH abuse   . GERD (gastroesophageal reflux disease)   . Alcohol abuse   . Carotid artery disease   . Pneumonia     CURRENT MEDICATIONS: Reviewed per MAR/see medication list  Allergies  Allergen Reactions  . Contrast Media [Iodinated Diagnostic Agents] Itching  . Diovan [Valsartan] Swelling    angioedema  . Neosporin  [Neomycin-Bacitracin Zn-Polymyx] Hives  . Neomycin Hives     REVIEW OF SYSTEMS:  GENERAL: no change in appetite, no fatigue, no weight changes, no fever, chills or weakness RESPIRATORY: no cough, SOB, DOE, wheezing, hemoptysis CARDIAC: no chest pain, or palpitations, + edema GI: no abdominal pain, diarrhea, constipation, heart burn, nausea or vomiting  PHYSICAL EXAMINATION  GENERAL: no acute distress, normal body habitus SKIN:  Right groin and right below knee surgical site has steri-strips intact, no redness, dry EYES: conjunctivae normal, sclerae normal, normal eye lids NECK: supple, trachea midline, no neck masses, no thyroid tenderness, no thyromegaly LYMPHATICS: no LAN in the neck, no supraclavicular LAN RESPIRATORY: breathing is even & unlabored, BS CTAB CARDIAC: RRR, +murmur,no extra heart sounds, RLE edema 2+ GI: abdomen soft, normal BS, no masses, no tenderness, no hepatomegaly, no splenomegaly EXTREMITIES: able to move all 4 extremities; ambulates with walker PSYCHIATRIC: the patient is alert & oriented to person, affect & behavior appropriate  LABS/RADIOLOGY: Labs reviewed: Basic Metabolic Panel:  Recent Labs  42/76/70 0500 11/20/13 0319 11/22/13 0506 11/24/13 0350  NA 134* 135* 137  --   K 3.7 3.6* 3.6*  --   CL 95* 99 101  --   CO2 26 23 21   --   GLUCOSE 94 107* 72  --   BUN 16 24* 23  --   CREATININE 0.74 0.84 0.85 0.69  CALCIUM 8.8 8.4 8.4  --    Liver Function Tests:  Recent Labs  05/01/13 1518 11/17/13  1141  AST 28 16  ALT 16 7  ALKPHOS 37* 36*  BILITOT 0.6 0.3  PROT 5.6* 6.6  ALBUMIN 3.2* 3.4*    Recent Labs  05/01/13 1518  LIPASE 16  AMYLASE 35   CBC:  Recent Labs  05/01/13 1518  11/19/13 0500 11/20/13 0319 11/22/13 0506  WBC 8.8  < > 7.8 6.7 6.0  NEUTROABS 6.3  --   --   --   --   HGB 15.1  < > 11.2* 9.7* 9.4*  HCT 43.2  < > 32.4* 28.0* 27.5*  MCV 88.3  < > 89.5 88.6 90.5  PLT 225  < > 224 214 243  < > = values in this  interval not displayed.   ASSESSMENT/PLAN:  Critical Limb Ischemia S/P Right extrenal iliac and common femoral endarterectomy and right femoral below knee popliteal bypass graft - for rehabilitation; continue Aggrenox, asa and Plavix Hypertension - well controlled; continue Norvasc, hydralazine, Cozaar and Toprol Hyperlipidemia - continue Lipitor GERD - continue Protonix Anemia, acute blood loss - check CBC   CPT CODE: [id number]    [facility] HospitalMEDINA-VARGAS,MONINA, NP [facility] [phone]

## 2013-11-27 ENCOUNTER — Non-Acute Institutional Stay (SKILLED_NURSING_FACILITY): Payer: Medicare Other | Admitting: Internal Medicine

## 2013-11-27 DIAGNOSIS — D62 Acute posthemorrhagic anemia: Secondary | ICD-10-CM | POA: Diagnosis not present

## 2013-11-27 DIAGNOSIS — I70229 Atherosclerosis of native arteries of extremities with rest pain, unspecified extremity: Secondary | ICD-10-CM | POA: Diagnosis not present

## 2013-11-27 DIAGNOSIS — I2510 Atherosclerotic heart disease of native coronary artery without angina pectoris: Secondary | ICD-10-CM | POA: Diagnosis not present

## 2013-11-27 DIAGNOSIS — J438 Other emphysema: Secondary | ICD-10-CM

## 2013-11-30 DIAGNOSIS — J438 Other emphysema: Secondary | ICD-10-CM | POA: Insufficient documentation

## 2013-11-30 NOTE — Progress Notes (Signed)
HISTORY & PHYSICAL  DATE: 11/27/2013   FACILITY: [facility]  LEVEL OF CARE: SNF (31)  ALLERGIES:  Allergies  Allergen Reactions  . Contrast Media [Iodinated Diagnostic Agents] Itching  . Diovan [Valsartan] Swelling    angioedema  . Neosporin [Neomycin-Bacitracin Zn-Polymyx] Hives  . Neomycin Hives    CHIEF COMPLAINT:  Manage PVD, COPD & CAD  HISTORY OF PRESENT ILLNESS:  Pt is a 78 y/o Caucasian male who was hospitalized for ischemia of right foot & mesenteric ascema.  After hospitalization he is admitted to this facility for short term rehabilitation.  PVD: The patient's peripheral vascular disease remains table. The patient denies ongoing claudication. No complications reported from the medication(s) currently being used.  Pt was having progressive rest pain of right foot therefore he underwent right external iliac & common femoral endarterectomy & right femoral to below knee popliteal bypass & tolerated the procedure well.   CAD: The angina has been stable. The patient denies dyspnea on exertion, orthopnea, pedal edema, palpitations and paroxysmal nocturnal dyspnea. No complications noted from the medication presently being used.  COPD: the COPD remains stable.  Pt denies sob, cough, wheezing or declining exercise tolerance.  No complications from the medications presently being used.  PAST MEDICAL HISTORY :  Past Medical History  Diagnosis Date  . 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 Hypertension   . PVD (peripheral vascular disease) 04/27/2011    normal renal & abdominal aortography; s/p Left Fem-Pop BPG 2004.  Marland Kitchen Dyslipidemia   . Coronary artery disease 04/27/2011    Low risk Myoview in 2010:Non-obstructive disase, except for small OMB 95% ostial lesion  . H/O ETOH abuse   . GERD (gastroesophageal reflux disease)   . Alcohol abuse   . Carotid artery disease   . Pneumonia     PAST SURGICAL  HISTORY: Past Surgical History  Procedure Laterality Date  . Foot surgery    . Nasal sinus surgery    . Femoral-popliteal bypass graft Left 11/05/2002    Dr. Josephina Gip  . Transthoracic echocardiogram  08/14/2012    EF 55-60%, mod LVH, grade 1 diastolic dysfunction; vent septum thickness mod increased; calcified MV annulus & mild MR; RV systolic pressure increased - mild pulm HTN; PA peak pressure  . Nm myocar perf wall motion  01/2009    dipyridamole; normal pattern of perfusion in all regions; post-stress EF 75%; normal study, low risk   . Cardiac catheterization  04/27/2011    normal L main & LAD; L Cfx with minor irregularities, small marginal branch with 95% osital stenosis, RCA is nondominant & normal, LVEF 60% (Dr. Erlene Quan)  . Lower extremity arterial doppler  08/2012    R CFA & Profunda Femoral - diffuse irregular mixed density plaque; R SFA distal 70-99% diameter reduction; R PTA appears occluded; L CFA distal/Prox Anastomosis 70-99% diameter reduction; L Fem-Pop BPG open & patent; bilat TBBIs with inadequate perfusion for healing   . Renal doppler  05/2012    SMA - elevated velocities >60% diameter reduction; bilat renal arteries with 60-99% diameter reduction; R & L kidneys are normal in size & symmetrical  . Carotid doppler  01/2010    R subclavian 50-69% diameter reduction, R prox ICA 50-69% diameter reduction, L prox ICA 50-69% diameter reduction  . Renal angiogram  06/25/2012    normal renal arteries  . Lower extremity angiogram  10/13/2010    patent left fem-pop bypass graft with moderate calcified prox disease in distal common femoral just prox to graft anastomosis, mod high-grade segmental mid and distal R SFA disease, 2-3 vessel runoff with patent diffusely disease posterior tibilalis bilaterally (Dr. Erlene QuanJ. Berry)  . Colonoscopy    . Endarterectomy femoral Right 11/17/2013    Procedure: ENDARTERECTOMY FEMORAL;  Surgeon: Larina Earthlyodd F Early, MD;  [facility];  Service: Vascular;   Laterality: Right;  . Femoral-popliteal bypass graft Right 11/17/2013    Procedure: BYPASS GRAFT FEMORAL-POPLITEAL ARTERY;  Surgeon: Larina Earthlyodd F Early, MD;  [facility];  Service: Vascular;  Laterality: Right;    SOCIAL HISTORY:  reports that he quit smoking about 16 years ago. His smoking use included Cigarettes. He has a 50 pack-year smoking history. He quit smokeless tobacco use about 15 years ago. He reports that he drinks about 1.2 ounces of alcohol per week. He reports that he does not use illicit drugs.  FAMILY HISTORY:  Family History  Problem Relation Age of Onset  . CAD Brother     also stroke  . Hypertension Brother   . Diabetes Brother   . Hypertension Father     also stroke  . Stroke Father   . Hypertension Brother      X 2  . Kidney cancer Mother   . Cancer Mother     Renal  . Cancer Sister     Lung  . COPD Sister   . Hypertension Sister     CURRENT MEDICATIONS: Reviewed per MAR/see medication list  REVIEW OF SYSTEMS:  See HPI otherwise 14 point ROS is negative.  PHYSICAL EXAMINATION  VS:  See VS section  GENERAL: no acute distress, thin body habitus EYES: conjunctivae normal, sclerae normal, normal eye lids MOUTH/THROAT: lips without lesions,no lesions in the mouth,tongue is without lesions,uvula elevates in midline NECK: supple, trachea midline, no neck masses, no thyroid tenderness, no thyromegaly LYMPHATICS: no LAN in the neck, no supraclavicular LAN RESPIRATORY: breathing is even & unlabored, BS CTAB CARDIAC: RRR, no murmur,no extra heart sounds, RLE +1 edema GI:  ABDOMEN: abdomen soft, normal BS, no masses, no tenderness  LIVER/SPLEEN: no hepatomegaly, no splenomegaly MUSCULOSKELETAL: HEAD: normal to inspection  EXTREMITIES: LEFT UPPER EXTREMITY: full range of motion, normal strength & tone RIGHT UPPER EXTREMITY:  full range of motion, normal strength & tone LEFT LOWER EXTREMITY:  moderate range of motion, normal strength & tone RIGHT LOWER  EXTREMITY: moderate range of motion, normal strength & tone PSYCHIATRIC: the patient is alert & oriented to person, affect & behavior appropriate  LABS/RADIOLOGY:  Labs reviewed: Basic Metabolic Panel:  Recent Labs  16/11/9608/14/15 0500 11/20/13 0319 11/22/13 0506 11/24/13 0350  NA 134* 135* 137  --   K 3.7 3.6* 3.6*  --   CL 95* 99 101  --   CO2 26 23 21   --   GLUCOSE 94 107* 72  --   BUN 16 24* 23  --   CREATININE 0.74 0.84 0.85 0.69  CALCIUM 8.8 8.4 8.4  --    Liver Function Tests:  Recent Labs  05/01/13 1518 11/17/13 1141  AST 28 16  ALT 16 7  ALKPHOS 37* 36*  BILITOT 0.6 0.3  PROT 5.6* 6.6  ALBUMIN 3.2* 3.4*    Recent Labs  05/01/13 1518  LIPASE 16  AMYLASE 35   CBC:  Recent Labs  05/01/13 1518  11/19/13 0500 11/20/13 0319 11/22/13 0506  WBC 8.8  < > 7.8  6.7 6.0  NEUTROABS 6.3  --   --   --   --   HGB 15.1  < > 11.2* 9.7* 9.4*  HCT 43.2  < > 32.4* 28.0* 27.5*  MCV 88.3  < > 89.5 88.6 90.5  PLT 225  < > 224 214 243  < > = values in this interval not displayed.   Lower Extremity Arterial Evaluation  Patient:    Gary [name], Gary [name] MR #:       [id number] Study Date: 11/20/2013 Gender:     M Age:        35 Height: Weight: BSA: Pt. Status: Room:       6C08C   ADMITTING    Tamala Ser Early  SONOGRAPHER  Alesia Banda, RVT  Soyla Murphy  Reports also to:  ------------------------------------------------------------------- History and indications:  History  Status post right external iliac common femoral arterial embolectomy and femoral to below the knee popliteal bypass graft with Goretex 11-17-13.  ------------------------------------------------------------------- Study information:  Study status:  Routine.    ABI.     Segmental pressure measurements, ankle-brachial index, and photoplethysmography. Birthdate:  Patient birthdate: 06/19/1935.  Age:  Patient is 79 yr old.  Sex:  Gender: male.  Study  date:  Study date: 11/20/2013. Study time: 04:33 PM.  Location:  Bedside.  Patient status: Inpatient.  Brachial pressures:  +--------+-----+----+---+         RightLeftMax +--------+-----+----+---+ Systolic170  171 171 +--------+-----+----+---+  Arterial pressure indices:  +-----------------+----------+--------------+----------+ Location         Pressure  Brachial indexWaveform   +-----------------+----------+--------------+----------+ Right ant tibial >290 mm Hg1.7           Monophasic +-----------------+----------+--------------+----------+ Right post tibial83 mm Hg  0.49          Monophasic +-----------------+----------+--------------+----------+ Right 1st toe    ------------------------Normal     +-----------------+----------+--------------+----------+ Left ant tibial  >294 mm Hg1.72          Monophasic +-----------------+----------+--------------+----------+ Left post tibial >293 mm Hg1.71          Monophasic +-----------------+----------+--------------+----------+ Left 1st toe     ------------------------Normal     +-----------------+----------+--------------+----------+  ------------------------------------------------------------------- Summary: Bilateral: ABI not ascertained due to non compressible arteries (bilateral anterior tibial and left posterior tibial) secondary to calcification. Great toe PPG waveforms indicate adequate perfusion.   ASSESSMENT/PLAN:  PVD-s/p surgical intervention.  Cont. Rehabilitation. COPD-compensated CAD-stable Acute blood loss anemia-recheck Hb. HTN-well controlled. GERD-cont. Protonix. Check cbc  I have reviewed patient's medical records received at admission/from hospitalization.  CPT CODE: 54098  Angela Cox, MD [facility] [phone]

## 2013-12-03 ENCOUNTER — Encounter (HOSPITAL_COMMUNITY): Admission: RE | Payer: Self-pay | Source: Ambulatory Visit

## 2013-12-03 ENCOUNTER — Ambulatory Visit (HOSPITAL_COMMUNITY): Admission: RE | Admit: 2013-12-03 | Payer: Medicare Other | Source: Ambulatory Visit | Admitting: Vascular Surgery

## 2013-12-03 SURGERY — ABDOMINAL AORTAGRAM
Anesthesia: LOCAL

## 2013-12-04 ENCOUNTER — Encounter: Payer: Self-pay | Admitting: Adult Health

## 2013-12-04 ENCOUNTER — Non-Acute Institutional Stay (SKILLED_NURSING_FACILITY): Payer: Medicare Other | Admitting: Adult Health

## 2013-12-04 DIAGNOSIS — I998 Other disorder of circulatory system: Secondary | ICD-10-CM

## 2013-12-04 DIAGNOSIS — D62 Acute posthemorrhagic anemia: Secondary | ICD-10-CM | POA: Diagnosis not present

## 2013-12-04 DIAGNOSIS — I70229 Atherosclerosis of native arteries of extremities with rest pain, unspecified extremity: Secondary | ICD-10-CM

## 2013-12-04 DIAGNOSIS — E785 Hyperlipidemia, unspecified: Secondary | ICD-10-CM

## 2013-12-04 DIAGNOSIS — I10 Essential (primary) hypertension: Secondary | ICD-10-CM

## 2013-12-04 DIAGNOSIS — K219 Gastro-esophageal reflux disease without esophagitis: Secondary | ICD-10-CM

## 2013-12-04 NOTE — Progress Notes (Signed)
Patient ID: Gary [name], male   DOB: 12-Jul-1935, 78 y.o.   MRN: [id number]   12/04/2013  Facility:  Nursing Home Location:  [facility] Room Number: 808-P LEVEL OF CARE:  SNF (31)    Chief Complaint  Patient presents with  . Discharge Note    HISTORY OF PRESENT ILLNESS:  This is a 78 year old male who is for discharge home with Home health PT and OT. DME: standard rolling walker. He has been admitted to [facility] on 11/25/13 from [facility] with Critical Limb Ischemia S/P Right extrenal iliac and common femoral endarterectomy and right femoral below knee popliteal bypass graft. Patient was admitted to this facility for short-term rehabilitation after the patient's recent hospitalization.  Patient has completed SNF rehabilitation and therapy has cleared the patient for discharge.  REASSESSMENT OF ONGOING PROBLEMS:  HYPERLIPIDEMIA: No complications from the medications presently being used.6/14 fasting lipid panel showed : Cholesterol 243 triglycerides 114 HDL 64 LDL 156  HTN: Pt 's HTN remains stable.  Denies CP, sob, DOE, headaches, dizziness or visual disturbances.  No complications from the medications currently being used.  Last BP :  128/61  ANEMIA: The anemia has been stable. The patient denies fatigue, melena or hematochezia. No complications from the medications currently being used. 10/15 hgb 10.4  PAST MEDICAL HISTORY:  Past Medical History  Diagnosis Date  . 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 Hypertension   . PVD (peripheral vascular disease) 04/27/2011    normal renal & abdominal aortography; s/p Left Fem-Pop BPG 2004.  Marland Kitchen Dyslipidemia   . Coronary artery disease 04/27/2011    Low risk Myoview in 2010:Non-obstructive disase, except for small OMB 95% ostial lesion  . H/O ETOH abuse   . GERD (gastroesophageal reflux disease)   . Alcohol abuse   . Carotid artery disease   .  Pneumonia     CURRENT MEDICATIONS: Reviewed per MAR/see medication list  Allergies  Allergen Reactions  . Contrast Media [Iodinated Diagnostic Agents] Itching  . Diovan [Valsartan] Swelling    angioedema  . Neosporin [Neomycin-Bacitracin Zn-Polymyx] Hives  . Neomycin Hives     REVIEW OF SYSTEMS:  GENERAL: no change in appetite, no fatigue, no weight changes, no fever, chills or weakness RESPIRATORY: no cough, SOB, DOE, wheezing, hemoptysis CARDIAC: no chest pain, or palpitations, + edema GI: no abdominal pain, diarrhea, constipation, heart burn, nausea or vomiting  PHYSICAL EXAMINATION  GENERAL: no acute distress, normal body habitus SKIN:  Right groin and right below knee surgical site has steri-strips intact, no redness, dry NECK: supple, trachea midline, no neck masses, no thyroid tenderness, no thyromegaly LYMPHATICS: no LAN in the neck, no supraclavicular LAN RESPIRATORY: breathing is even & unlabored, BS CTAB CARDIAC: RRR, +murmur,no extra heart sounds, RLE edema 1+ GI: abdomen soft, normal BS, no masses, no tenderness, no hepatomegaly, no splenomegaly EXTREMITIES: able to move all 4 extremities; ambulates with walker PSYCHIATRIC: the patient is alert & oriented to person, affect & behavior appropriate  LABS/RADIOLOGY: 11/27/13  WBC 6.9 hemoglobin 10.4 hematocrit 30.7 MCV 92.5 Labs reviewed: Basic Metabolic Panel:  Recent Labs  03/00/92 0500 11/20/13 0319 11/22/13 0506 11/24/13 0350  NA 134* 135* 137  --   K 3.7 3.6* 3.6*  --   CL 95* 99 101  --   CO2 26 23 21   --   GLUCOSE 94 107* 72  --   BUN 16 24*  23  --   CREATININE 0.74 0.84 0.85 0.69  CALCIUM 8.8 8.4 8.4  --    Liver Function Tests:  Recent Labs  05/01/13 1518 11/17/13 1141  AST 28 16  ALT 16 7  ALKPHOS 37* 36*  BILITOT 0.6 0.3  PROT 5.6* 6.6  ALBUMIN 3.2* 3.4*    Recent Labs  05/01/13 1518  LIPASE 16  AMYLASE 35   CBC:  Recent Labs  05/01/13 1518  11/19/13 0500  11/20/13 0319 11/22/13 0506  WBC 8.8  < > 7.8 6.7 6.0  NEUTROABS 6.3  --   --   --   --   HGB 15.1  < > 11.2* 9.7* 9.4*  HCT 43.2  < > 32.4* 28.0* 27.5*  MCV 88.3  < > 89.5 88.6 90.5  PLT 225  < > 224 214 243  < > = values in this interval not displayed.   ASSESSMENT/PLAN:  Critical Limb Ischemia S/P Right extrenal iliac and common femoral endarterectomy and right femoral below knee popliteal bypass graft - for home health PT and OT; continue Aggrenox, asa and Plavix Hypertension - well controlled; continue Norvasc, hydralazine, Cozaar and Toprol Hyperlipidemia - continue Lipitor GERD - continue Protonix Anemia, acute blood loss - stable   I have filled out patient's discharge paperwork and written prescriptions.  Patient will receive home health PT and OT.  DME provided: standard rolling walker  Total discharge time: Greater than 30 minutes  Discharge time involved coordination of the discharge process with social worker, nursing staff and therapy department. Medical justification for home health services/DME verified.    CPT CODE: [id number]   [facility] CenterMEDINA-VARGAS,MONINA, NP [facility] [phone]

## 2013-12-08 ENCOUNTER — Encounter: Payer: Self-pay | Admitting: Vascular Surgery

## 2013-12-08 DIAGNOSIS — J441 Chronic obstructive pulmonary disease with (acute) exacerbation: Secondary | ICD-10-CM | POA: Diagnosis not present

## 2013-12-08 DIAGNOSIS — I739 Peripheral vascular disease, unspecified: Secondary | ICD-10-CM | POA: Diagnosis not present

## 2013-12-08 DIAGNOSIS — I70221 Atherosclerosis of native arteries of extremities with rest pain, right leg: Secondary | ICD-10-CM | POA: Diagnosis not present

## 2013-12-08 DIAGNOSIS — G451 Carotid artery syndrome (hemispheric): Secondary | ICD-10-CM | POA: Diagnosis not present

## 2013-12-08 DIAGNOSIS — Z720 Tobacco use: Secondary | ICD-10-CM | POA: Diagnosis not present

## 2013-12-08 DIAGNOSIS — I2510 Atherosclerotic heart disease of native coronary artery without angina pectoris: Secondary | ICD-10-CM

## 2013-12-09 ENCOUNTER — Ambulatory Visit (INDEPENDENT_AMBULATORY_CARE_PROVIDER_SITE_OTHER): Payer: Self-pay | Admitting: Vascular Surgery

## 2013-12-09 ENCOUNTER — Encounter: Payer: Self-pay | Admitting: Vascular Surgery

## 2013-12-09 ENCOUNTER — Encounter: Payer: Medicare Other | Admitting: Vascular Surgery

## 2013-12-09 VITALS — BP 146/53 | HR 63 | Resp 18 | Ht 67.0 in | Wt 125.6 lb

## 2013-12-09 DIAGNOSIS — Z48812 Encounter for surgical aftercare following surgery on the circulatory system: Secondary | ICD-10-CM

## 2013-12-09 DIAGNOSIS — I70219 Atherosclerosis of native arteries of extremities with intermittent claudication, unspecified extremity: Secondary | ICD-10-CM

## 2013-12-09 DIAGNOSIS — I739 Peripheral vascular disease, unspecified: Secondary | ICD-10-CM

## 2013-12-09 NOTE — Progress Notes (Signed)
Here today for follow-up of his recent urgent right femoropopliteal bypass for critical limb ischemia. He presented several weeks earlier with multiple peripheral vascular issues. He had lost approximately 60 pounds. Felt this most likely was related to mesenteric ischemia. We were considering evaluation of this when he presented with a more critical rest pain. He underwent urgent right femoral to below-knee popliteal bypass with propatent Gore-Tex graft since he has no vein for vein graft. He then subsequently underwent aortogram and angioplasty of the celiac and superior mesenteric arteries with stenting of both of these vessels. He was discharged to a nursing facility.  He looks quite good today. He was recently discharged from a nursing facility. He reports that he has gained 6 pounds since discharge and is eating with no pain. He has no further right foot rest pain as well.  He does have a easily palpable right popliteal graft pulse.  Stable overall. We'll continue his walking program. He does have a known high-grade asymptomatic carotid stenosis and certainly recommended deferring any further treatment of this until he has recovered from his recent hospitalization. We will see him again discuss this further in 3 months

## 2013-12-09 NOTE — Addendum Note (Signed)
Addended by: Sharee Pimple on: 12/09/2013 02:15 PM   Modules accepted: Orders

## 2013-12-12 DIAGNOSIS — Z720 Tobacco use: Secondary | ICD-10-CM | POA: Diagnosis not present

## 2013-12-12 DIAGNOSIS — J441 Chronic obstructive pulmonary disease with (acute) exacerbation: Secondary | ICD-10-CM | POA: Diagnosis not present

## 2013-12-12 DIAGNOSIS — I739 Peripheral vascular disease, unspecified: Secondary | ICD-10-CM | POA: Diagnosis not present

## 2013-12-12 DIAGNOSIS — G451 Carotid artery syndrome (hemispheric): Secondary | ICD-10-CM | POA: Diagnosis not present

## 2013-12-12 DIAGNOSIS — I2510 Atherosclerotic heart disease of native coronary artery without angina pectoris: Secondary | ICD-10-CM | POA: Diagnosis not present

## 2013-12-12 DIAGNOSIS — I70221 Atherosclerosis of native arteries of extremities with rest pain, right leg: Secondary | ICD-10-CM | POA: Diagnosis not present

## 2013-12-15 DIAGNOSIS — I739 Peripheral vascular disease, unspecified: Secondary | ICD-10-CM | POA: Diagnosis not present

## 2013-12-15 DIAGNOSIS — Z720 Tobacco use: Secondary | ICD-10-CM | POA: Diagnosis not present

## 2013-12-15 DIAGNOSIS — I70221 Atherosclerosis of native arteries of extremities with rest pain, right leg: Secondary | ICD-10-CM | POA: Diagnosis not present

## 2013-12-15 DIAGNOSIS — J441 Chronic obstructive pulmonary disease with (acute) exacerbation: Secondary | ICD-10-CM | POA: Diagnosis not present

## 2013-12-15 DIAGNOSIS — I2510 Atherosclerotic heart disease of native coronary artery without angina pectoris: Secondary | ICD-10-CM | POA: Diagnosis not present

## 2013-12-15 DIAGNOSIS — G451 Carotid artery syndrome (hemispheric): Secondary | ICD-10-CM | POA: Diagnosis not present

## 2013-12-16 ENCOUNTER — Ambulatory Visit: Payer: Medicare Other | Admitting: Cardiovascular Disease

## 2013-12-17 ENCOUNTER — Ambulatory Visit: Payer: Medicare Other | Admitting: Cardiovascular Disease

## 2013-12-17 DIAGNOSIS — G451 Carotid artery syndrome (hemispheric): Secondary | ICD-10-CM | POA: Diagnosis not present

## 2013-12-17 DIAGNOSIS — I70221 Atherosclerosis of native arteries of extremities with rest pain, right leg: Secondary | ICD-10-CM | POA: Diagnosis not present

## 2013-12-17 DIAGNOSIS — I739 Peripheral vascular disease, unspecified: Secondary | ICD-10-CM | POA: Diagnosis not present

## 2013-12-17 DIAGNOSIS — I2510 Atherosclerotic heart disease of native coronary artery without angina pectoris: Secondary | ICD-10-CM | POA: Diagnosis not present

## 2013-12-17 DIAGNOSIS — J441 Chronic obstructive pulmonary disease with (acute) exacerbation: Secondary | ICD-10-CM | POA: Diagnosis not present

## 2013-12-17 DIAGNOSIS — Z720 Tobacco use: Secondary | ICD-10-CM | POA: Diagnosis not present

## 2013-12-22 DIAGNOSIS — Z720 Tobacco use: Secondary | ICD-10-CM | POA: Diagnosis not present

## 2013-12-22 DIAGNOSIS — I739 Peripheral vascular disease, unspecified: Secondary | ICD-10-CM | POA: Diagnosis not present

## 2013-12-22 DIAGNOSIS — I70221 Atherosclerosis of native arteries of extremities with rest pain, right leg: Secondary | ICD-10-CM | POA: Diagnosis not present

## 2013-12-22 DIAGNOSIS — J441 Chronic obstructive pulmonary disease with (acute) exacerbation: Secondary | ICD-10-CM | POA: Diagnosis not present

## 2013-12-22 DIAGNOSIS — I2510 Atherosclerotic heart disease of native coronary artery without angina pectoris: Secondary | ICD-10-CM | POA: Diagnosis not present

## 2013-12-22 DIAGNOSIS — G451 Carotid artery syndrome (hemispheric): Secondary | ICD-10-CM | POA: Diagnosis not present

## 2013-12-24 DIAGNOSIS — I2510 Atherosclerotic heart disease of native coronary artery without angina pectoris: Secondary | ICD-10-CM | POA: Diagnosis not present

## 2013-12-24 DIAGNOSIS — Z720 Tobacco use: Secondary | ICD-10-CM | POA: Diagnosis not present

## 2013-12-24 DIAGNOSIS — I70221 Atherosclerosis of native arteries of extremities with rest pain, right leg: Secondary | ICD-10-CM | POA: Diagnosis not present

## 2013-12-24 DIAGNOSIS — J441 Chronic obstructive pulmonary disease with (acute) exacerbation: Secondary | ICD-10-CM | POA: Diagnosis not present

## 2013-12-24 DIAGNOSIS — I739 Peripheral vascular disease, unspecified: Secondary | ICD-10-CM | POA: Diagnosis not present

## 2013-12-24 DIAGNOSIS — G451 Carotid artery syndrome (hemispheric): Secondary | ICD-10-CM | POA: Diagnosis not present

## 2013-12-26 ENCOUNTER — Telehealth: Payer: Self-pay | Admitting: *Deleted

## 2013-12-26 NOTE — Telephone Encounter (Signed)
I received call from Boomer, PT from [facility] stating that patients leg was mildly swollen distally but no temp or redness. I called [name] to check status and she informs me that the swelling was very mild and stated that patient had been sitting a lot instead of elevating as Dr Arbie Cookey had instructed.  [name] is to call the office if she feels the swelling is worsening or if the area becomes warm or red. Also she was going to see that he elevates his legs. [name] voiced understanding and importance of the recommendations.

## 2014-01-15 ENCOUNTER — Encounter (HOSPITAL_COMMUNITY): Payer: Self-pay | Admitting: Cardiovascular Disease

## 2014-01-16 ENCOUNTER — Encounter: Payer: Self-pay | Admitting: Internal Medicine

## 2014-01-16 ENCOUNTER — Ambulatory Visit (INDEPENDENT_AMBULATORY_CARE_PROVIDER_SITE_OTHER): Payer: Medicare Other | Admitting: Internal Medicine

## 2014-01-16 VITALS — BP 140/88 | HR 64 | Temp 98.1°F | Wt 133.0 lb

## 2014-01-16 DIAGNOSIS — R05 Cough: Secondary | ICD-10-CM

## 2014-01-16 DIAGNOSIS — R3589 Other polyuria: Secondary | ICD-10-CM

## 2014-01-16 DIAGNOSIS — I701 Atherosclerosis of renal artery: Secondary | ICD-10-CM | POA: Diagnosis not present

## 2014-01-16 DIAGNOSIS — J441 Chronic obstructive pulmonary disease with (acute) exacerbation: Secondary | ICD-10-CM | POA: Diagnosis not present

## 2014-01-16 DIAGNOSIS — R358 Other polyuria: Secondary | ICD-10-CM

## 2014-01-16 DIAGNOSIS — R059 Cough, unspecified: Secondary | ICD-10-CM

## 2014-01-16 DIAGNOSIS — K559 Vascular disorder of intestine, unspecified: Secondary | ICD-10-CM | POA: Diagnosis not present

## 2014-01-16 DIAGNOSIS — E785 Hyperlipidemia, unspecified: Secondary | ICD-10-CM | POA: Diagnosis not present

## 2014-01-16 MED ORDER — CLOPIDOGREL BISULFATE 75 MG PO TABS: 75.0000 mg | ORAL_TABLET | Freq: Every day | ORAL | Status: DC

## 2014-01-16 MED ORDER — PREDNISONE 10 MG PO TABS: ORAL_TABLET | ORAL | Status: DC

## 2014-01-16 NOTE — Progress Notes (Signed)
HPI  Pt presents to the clinic today with c/o cough. This started 2-3 weeks ago. The cough is productive of yellow mucous. He has had some associated nasal congestion and itchy eyes. He denies shortness of breath. He does have a history of COPD. He denies fever, chills or body aches. He has not tried anything OTC.   Additionally, upon doing his med review, I noticed he was not taking his Plavix. Upon chart review, he has stenting of the SMA and Celiac artery by Dr. Imogene Burnhen. He was being followed by Dr. Arbie CookeyEarly and had follow up appt with him 03/2014. His daughter reports that they never picked up an RX for plavix.  He also reports some concerns about having diabetes. He has never been told that he has had this in the past. He does go to bathroom frequently. He denies burning with urination, bladder pressure or low back pain.   Review of Systems      Past Medical History  Diagnosis Date  . 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 Hypertension   . PVD (peripheral vascular disease) 04/27/2011    normal renal & abdominal aortography; s/p Left Fem-Pop BPG 2004.  Marland Kitchen. Dyslipidemia   . Coronary artery disease 04/27/2011    Low risk Myoview in 2010:Non-obstructive disase, except for small OMB 95% ostial lesion  . H/O ETOH abuse   . GERD (gastroesophageal reflux disease)   . Alcohol abuse   . Carotid artery disease   . Pneumonia     Family History  Problem Relation Age of Onset  . CAD Brother     also stroke  . Hypertension Brother   . Diabetes Brother   . Hypertension Father     also stroke  . Stroke Father   . Hypertension Brother      X 2  . Kidney cancer Mother   . Cancer Mother     Renal  . Cancer Sister     Lung  . COPD Sister   . Hypertension Sister     History   Social History  . Marital Status: Divorced    Spouse Name: N/A    Number of Children: 3  . Years of Education: N/A   Occupational History  . welder, pipe fitter-Retired     Social History Main Topics  . Smoking status: Former Smoker -- 1.00 packs/day for 50 years    Types: Cigarettes    Quit date: 03/03/1997  . Smokeless tobacco: Former NeurosurgeonUser    Quit date: 04/27/1998  . Alcohol Use: 1.2 oz/week    2 Cans of beer per week     Comment: " daily beer generally"  . Drug Use: No  . Sexual Activity: No   Other Topics Concern  . Not on file   Social History Narrative    Allergies  Allergen Reactions  . Contrast Media [Iodinated Diagnostic Agents] Itching  . Diovan [Valsartan] Swelling    angioedema  . Neosporin [Neomycin-Bacitracin Zn-Polymyx] Hives  . Neomycin Hives     Constitutional: Positive  fatigue. Denies headache, fever.  HEENT:  Positive ear fullness, sore throat. Denies eye redness, eye pain, pressure behind the eyes, facial pain, nasal congestion, ear pain, ringing in the ears, wax buildup, runny nose or bloody nose. Respiratory: Positive cough. Denies difficulty breathing or shortness of breath.  Cardiovascular: Denies chest pain, chest tightness, palpitations or swelling in the hands or feet.  GI: Pt reports polyuria. Denies increased  thirst, diarrhea, constipation or blood in stool.  No other specific complaints in a complete review of systems (except as listed in HPI above).  Objective:   BP 140/88 mmHg  Pulse 64  Temp(Src) 98.1 F (36.7 C) (Oral)  Wt 133 lb (60.328 kg)  SpO2 98%  Wt Readings from Last 3 Encounters:  12/09/13 125 lb 9.6 oz (56.972 kg)  12/04/13 121 lb 6.4 oz (55.067 kg)  11/26/13 117 lb (53.071 kg)     General: Appears his stated age, chronically ill appearing in NAD. HEENT: Head: normal shape and size;  Ears: Tm's gray and intact, normal light reflex; Throat/Mouth: Teeth present, mucosa erythematous and moist, no exudate noted, no lesions or ulcerations noted. No adenopathy noted. Cardiovascular: Normal rate and rhythm. S1,S2 noted.  No murmur, rubs or gallops noted. No JVD or BLE edema.   Pulmonary/Chest: Normal effort and diminished breath sounds. Bilateral expiratory wheezes noted. No respiratory distress. No rales or ronchi noted.  Abdomen: Soft, sensitive (not tender) around the periumbilical area. Active bowel sounds.    Assessment & Plan:   COPD exacerbation:  Get some rest and drink plenty of water Do salt water gargles for the sore throat eRx for pred taper for cough  Mesenteric ischemia s/p stenting of the SMA:  Hospital notes [facility], spoke with Okey Regal RN RX provided for the Plavx, advised him the importance of taking this daily He will follow up with Dr. Arbie Cookey in 03/2014  Polyuria:  Will check A1C  RTC as needed or if symptoms persist or worsen   RTC as needed or if symptoms persist.

## 2014-01-16 NOTE — Patient Instructions (Signed)
Cough, Adult  A cough is a reflex that helps clear your throat and airways. It can help heal the body or may be a reaction to an irritated airway. A cough may only last 2 or 3 weeks (acute) or may last more than 8 weeks (chronic).  CAUSES Acute cough:  Viral or bacterial infections. Chronic cough:  Infections.  Allergies.  Asthma.  Post-nasal drip.  Smoking.  Heartburn or acid reflux.  Some medicines.  Chronic lung problems (COPD).  Cancer. SYMPTOMS   Cough.  Fever.  Chest pain.  Increased breathing rate.  High-pitched whistling sound when breathing (wheezing).  Colored mucus that you cough up (sputum). TREATMENT   A bacterial cough may be treated with antibiotic medicine.  A viral cough must run its course and will not respond to antibiotics.  Your caregiver may recommend other treatments if you have a chronic cough. HOME CARE INSTRUCTIONS   Only take over-the-counter or prescription medicines for pain, discomfort, or fever as directed by your caregiver. Use cough suppressants only as directed by your caregiver.  Use a cold steam vaporizer or humidifier in your bedroom or home to help loosen secretions.  Sleep in a semi-upright position if your cough is worse at night.  Rest as needed.  Stop smoking if you smoke. SEEK IMMEDIATE MEDICAL CARE IF:   You have pus in your sputum.  Your cough starts to worsen.  You cannot control your cough with suppressants and are losing sleep.  You begin coughing up blood.  You have difficulty breathing.  You develop pain which is getting worse or is uncontrolled with medicine.  You have a fever. MAKE SURE YOU:   Understand these instructions.  Will watch your condition.  Will get help right away if you are not doing well or get worse. Document Released: 07/22/2010 Document Revised: 04/17/2011 Document Reviewed: 07/22/2010 Exit[employer] Patient Information 2015 [employer]. This information is not intended  to replace advice given to you by your health care provider. Make sure you discuss any questions you have with your health care provider.  

## 2014-01-16 NOTE — Progress Notes (Signed)
Pre visit review using our clinic review tool, if applicable. No additional management support is needed unless otherwise documented below in the visit note. 

## 2014-01-17 LAB — HEMOGLOBIN A1C
Hgb A1c MFr Bld: 5 % (ref <5.7)
MEAN PLASMA GLUCOSE: 97 mg/dL (ref <117)

## 2014-01-18 MED ORDER — FOLIC ACID 1 MG PO TABS: 1.0000 mg | ORAL_TABLET | Freq: Every day | ORAL | Status: DC

## 2014-02-03 ENCOUNTER — Other Ambulatory Visit: Payer: Self-pay | Admitting: Cardiovascular Disease

## 2014-02-03 NOTE — Telephone Encounter (Signed)
Rx(s) sent to pharmacy electronically.  

## 2014-02-10 ENCOUNTER — Other Ambulatory Visit (HOSPITAL_COMMUNITY): Payer: Self-pay | Admitting: Cardiovascular Disease

## 2014-02-10 DIAGNOSIS — I739 Peripheral vascular disease, unspecified: Secondary | ICD-10-CM

## 2014-02-14 ENCOUNTER — Other Ambulatory Visit: Payer: Self-pay | Admitting: Cardiovascular Disease

## 2014-02-16 NOTE — Telephone Encounter (Signed)
Rx refill sent to patient pharmacy   

## 2014-02-21 ENCOUNTER — Other Ambulatory Visit: Payer: Self-pay | Admitting: Cardiovascular Disease

## 2014-03-05 ENCOUNTER — Other Ambulatory Visit: Payer: Self-pay | Admitting: *Deleted

## 2014-03-05 ENCOUNTER — Ambulatory Visit (HOSPITAL_COMMUNITY)
Admission: RE | Admit: 2014-03-05 | Discharge: 2014-03-05 | Disposition: A | Discharge status: Home / Self Care | Payer: Medicare Other | Source: Ambulatory Visit | Attending: Cardiovascular Disease | Admitting: Cardiovascular Disease

## 2014-03-05 DIAGNOSIS — I739 Peripheral vascular disease, unspecified: Secondary | ICD-10-CM

## 2014-03-05 MED ORDER — AMLODIPINE BESYLATE 5 MG PO TABS: 5.0000 mg | ORAL_TABLET | Freq: Every day | ORAL | Status: DC

## 2014-03-05 MED ORDER — CHLORTHALIDONE 25 MG PO TABS: 25.0000 mg | ORAL_TABLET | Freq: Every day | ORAL | Status: DC

## 2014-03-05 MED ORDER — CLOPIDOGREL BISULFATE 75 MG PO TABS: 75.0000 mg | ORAL_TABLET | Freq: Every day | ORAL | Status: DC

## 2014-03-05 MED ORDER — PANTOPRAZOLE SODIUM 40 MG PO TBEC: 40.0000 mg | DELAYED_RELEASE_TABLET | Freq: Every day | ORAL | Status: DC

## 2014-03-05 MED ORDER — METOPROLOL SUCCINATE ER 25 MG PO TB24: 25.0000 mg | ORAL_TABLET | Freq: Every day | ORAL | Status: DC

## 2014-03-05 MED ORDER — LOSARTAN POTASSIUM 100 MG PO TABS: 100.0000 mg | ORAL_TABLET | Freq: Every day | ORAL | Status: DC

## 2014-03-05 MED ORDER — FENOFIBRIC ACID 135 MG PO CPDR: 1.0000 | DELAYED_RELEASE_CAPSULE | Freq: Every day | ORAL | Status: DC

## 2014-03-05 MED ORDER — FOLIC ACID 1 MG PO TABS: 1.0000 mg | ORAL_TABLET | Freq: Every day | ORAL | Status: DC

## 2014-03-05 MED ORDER — ATORVASTATIN CALCIUM 10 MG PO TABS: 10.0000 mg | ORAL_TABLET | Freq: Every day | ORAL | Status: DC

## 2014-03-05 MED ORDER — HYDRALAZINE HCL 50 MG PO TABS: ORAL_TABLET | ORAL | Status: DC

## 2014-03-16 ENCOUNTER — Encounter: Payer: Self-pay | Admitting: Vascular Surgery

## 2014-03-17 ENCOUNTER — Ambulatory Visit (INDEPENDENT_AMBULATORY_CARE_PROVIDER_SITE_OTHER): Payer: Medicare Other | Admitting: Vascular Surgery

## 2014-03-17 ENCOUNTER — Encounter: Payer: Self-pay | Admitting: Vascular Surgery

## 2014-03-17 ENCOUNTER — Ambulatory Visit (INDEPENDENT_AMBULATORY_CARE_PROVIDER_SITE_OTHER)
Admission: RE | Admit: 2014-03-17 | Discharge: 2014-03-17 | Disposition: A | Discharge status: Home / Self Care | Payer: Medicare Other | Source: Ambulatory Visit | Attending: Vascular Surgery | Admitting: Vascular Surgery

## 2014-03-17 ENCOUNTER — Ambulatory Visit (HOSPITAL_COMMUNITY)
Admission: RE | Admit: 2014-03-17 | Discharge: 2014-03-17 | Disposition: A | Discharge status: Home / Self Care | Payer: Medicare Other | Source: Ambulatory Visit | Attending: Vascular Surgery | Admitting: Vascular Surgery

## 2014-03-17 VITALS — BP 163/56 | HR 63 | Ht 67.0 in | Wt 133.6 lb

## 2014-03-17 DIAGNOSIS — I739 Peripheral vascular disease, unspecified: Secondary | ICD-10-CM | POA: Diagnosis not present

## 2014-03-17 DIAGNOSIS — I70302 Unspecified atherosclerosis of unspecified type of bypass graft(s) of the extremities, left leg: Secondary | ICD-10-CM | POA: Insufficient documentation

## 2014-03-17 DIAGNOSIS — Z48812 Encounter for surgical aftercare following surgery on the circulatory system: Secondary | ICD-10-CM | POA: Diagnosis not present

## 2014-03-17 DIAGNOSIS — I70219 Atherosclerosis of native arteries of extremities with intermittent claudication, unspecified extremity: Secondary | ICD-10-CM

## 2014-03-17 NOTE — Progress Notes (Signed)
Here today for follow-up of his diffuse peripheral vascular occlusive disease. He most recently status post right femoral-popliteal bypass for critical limb ischemia in October 2015. He is quite well difficulty. At that time he also presented with severe chronic mesenteric ischemia and had lost radial weight was down 118 pounds. He had successful angioplasty of his spare mesenteric artery and celiac artery. He has recovered and now weighs 133 pounds on today's exam. He does not have any abdominal pain with eating. He has a remote history years ago of left femoropopliteal bypass and this remains patent as well. He was found to have high-grade carotid stenosis and duplex in June 2015. This was on the right side. I had recommended deferral of treatment until he is fully recovered from the other major symptomatic issues. He has had no neurologic deficits  Past Medical History  Diagnosis Date  . 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 Hypertension   . PVD (peripheral vascular disease) 04/27/2011    normal renal & abdominal aortography; s/p Left Fem-Pop BPG 2004.  Marland Kitchen Dyslipidemia   . Coronary artery disease 04/27/2011    Low risk Myoview in 2010:Non-obstructive disase, except for small OMB 95% ostial lesion  . H/O ETOH abuse   . GERD (gastroesophageal reflux disease)   . Alcohol abuse   . Carotid artery disease   . Pneumonia     History  Substance Use Topics  . Smoking status: Former Smoker -- 1.00 packs/day for 50 years    Types: Cigarettes    Quit date: 03/03/1997  . Smokeless tobacco: Former Neurosurgeon    Quit date: 04/27/1998  . Alcohol Use: 1.2 oz/week    2 Cans of beer per week     Comment: " daily beer generally"    Family History  Problem Relation Age of Onset  . CAD Brother     also stroke  . Hypertension Brother   . Diabetes Brother   . Hypertension Father     also stroke  . Stroke Father   . Hypertension Brother      X 2  . Kidney  cancer Mother   . Cancer Mother     Renal  . Cancer Sister     Lung  . COPD Sister   . Hypertension Sister     Allergies  Allergen Reactions  . Contrast Media [Iodinated Diagnostic Agents] Itching  . Diovan [Valsartan] Swelling    angioedema  . Neosporin [Neomycin-Bacitracin Zn-Polymyx] Hives  . Neomycin Hives     Current outpatient prescriptions:  .  amLODipine (NORVASC) 5 MG tablet, Take 1 tablet (5 mg total) by mouth daily., Disp: 30 tablet, Rfl: 11 .  aspirin EC 81 MG tablet, Take 81 mg by mouth at bedtime., Disp: , Rfl:  .  atorvastatin (LIPITOR) 10 MG tablet, Take 1 tablet (10 mg total) by mouth daily., Disp: 30 tablet, Rfl: 11 .  chlorthalidone (HYGROTON) 25 MG tablet, Take 1 tablet (25 mg total) by mouth daily., Disp: 30 tablet, Rfl: 11 .  Choline Fenofibrate (FENOFIBRIC ACID) 135 MG CPDR, TAKE 1 CAPSULE DAILY., Disp: 30 capsule, Rfl: 7 .  Choline Fenofibrate (FENOFIBRIC ACID) 135 MG CPDR, Take 1 capsule by mouth daily., Disp: 30 capsule, Rfl: 11 .  Choline Fenofibrate 135 MG capsule, Take 135 mg by mouth daily., Disp: , Rfl:  .  clopidogrel (PLAVIX) 75 MG tablet, Take 1 tablet (75 mg total) by mouth daily., Disp: 30 tablet, Rfl:  11 .  co-enzyme Q-10 30 MG capsule, Take 30 mg by mouth daily., Disp: , Rfl:  .  folic acid (FOLVITE) 1 MG tablet, Take 1 tablet (1 mg total) by mouth daily., Disp: 30 tablet, Rfl: 11 .  hydrALAZINE (APRESOLINE) 50 MG tablet, TAKE 1.5 TABLETS (75 MG TOTAL) BY MOUTH 3 (THREE) TIMES DAILY., Disp: 135 tablet, Rfl: 8 .  hydrALAZINE (APRESOLINE) 50 MG tablet, TAKE 1.5 TABLETS (75 MG TOTAL) BY MOUTH 3 (THREE) TIMES DAILY., Disp: 135 tablet, Rfl: 11 .  losartan (COZAAR) 100 MG tablet, Take 1 tablet (100 mg total) by mouth daily., Disp: 30 tablet, Rfl: 11 .  metoprolol succinate (TOPROL-XL) 25 MG 24 hr tablet, Take 1 tablet (25 mg total) by mouth daily., Disp: 30 tablet, Rfl: 11 .  pantoprazole (PROTONIX) 40 MG tablet, Take 1 tablet (40 mg total) by mouth  daily., Disp: 30 tablet, Rfl: 11 .  vitamin B-12 (CYANOCOBALAMIN) 100 MCG tablet, Take 100 mcg by mouth daily., Disp: , Rfl:  .  albuterol (PROVENTIL HFA;VENTOLIN HFA) 108 (90 BASE) MCG/ACT inhaler, Inhale 2 puffs into the lungs every 4 (four) hours as needed for wheezing. (Patient not taking: Reported on 03/17/2014), Disp: 1 Inhaler, Rfl: 12 .  oxyCODONE-acetaminophen (PERCOCET/ROXICET) 5-325 MG per tablet, Take 1 tablet by mouth every 6 (six) hours as needed. (Patient not taking: Reported on 03/17/2014), Disp: 30 tablet, Rfl: 0 .  predniSONE (DELTASONE) 10 MG tablet, Take 3 tabs on days 1-2, take 2 tabs on days 3-4, take 1 tab on days 5-6 (Patient not taking: Reported on 03/17/2014), Disp: 12 tablet, Rfl: 0 .  pseudoephedrine (SUDAFED) 60 MG tablet, Take 60 mg by mouth every 4 (four) hours as needed for congestion., Disp: , Rfl:   BP 163/56 mmHg  Pulse 63  Ht  (1.702 m)  Wt 133 lb 9.6 oz (60.601 kg)  BMI 20.92 kg/m2  SpO2 100%  Body mass index is 20.92 kg/(m^2).        Physical exam well-developed well-nourished gentleman appearing stated age in no acute distress. He does have a harsh right carotid bruit I do not appreciate a bruit on the left. Heart is regular rate and rhythm. Radial pulses are 2+ bilaterally. He does have 2+ femoral and 2+ popliteal pulses bilaterally. Abdomen soft nontender with no bruits present.   Noninvasive studies today reveal normal ankle arm index with patent grafts bilaterally. There are some elevated velocities in his profunda and just above the takeoff on the left  Them pop anastomosis. He has biphasic waveforms below this.   He did have noninvasive studies at Dr. Hazle Coca office within the last couple weeks. This shows a patency of his SMA and celiac artery. With the stent is difficult to tell Thursday any restenosis but certainly has no symptoms. Would not recommend any further evaluation unless he has symptoms   Impression and plan diffuse  disease.  Recommended continued surveillance of his bilateral femoral-popliteal bypasses. Would not recommend any evaluation of his mesenteric vessels unless he developed new symptoms. I have recommended endarterectomy. Of his right carotid. I did explain the 5% annual risk of stroke or transient ischemic attack. Explained 1-1/2% risk of surgery of stroke with surgery. I explained that certainly for his and his family judgments for his right time to do this. I feel safe to proceed. The patient and his daughter present will speak with the other daughter and let us know when they want to proceed. I would repeat his duplex and CT  has not had his carotid since June to confirm that he is not having asymptomatic occlusion. Assuming this is not the case we would proceed with right carotid endarterectomy

## 2014-03-19 ENCOUNTER — Other Ambulatory Visit: Payer: Self-pay | Admitting: *Deleted

## 2014-03-19 DIAGNOSIS — Z48812 Encounter for surgical aftercare following surgery on the circulatory system: Secondary | ICD-10-CM

## 2014-03-19 DIAGNOSIS — I6523 Occlusion and stenosis of bilateral carotid arteries: Secondary | ICD-10-CM

## 2014-03-30 ENCOUNTER — Other Ambulatory Visit (HOSPITAL_COMMUNITY): Payer: Medicare Other

## 2014-04-13 ENCOUNTER — Ambulatory Visit (HOSPITAL_COMMUNITY)
Admission: RE | Admit: 2014-04-13 | Discharge: 2014-04-13 | Disposition: A | Discharge status: Home / Self Care | Payer: Medicare Other | Source: Ambulatory Visit | Attending: Vascular Surgery | Admitting: Vascular Surgery

## 2014-04-13 DIAGNOSIS — Z48812 Encounter for surgical aftercare following surgery on the circulatory system: Secondary | ICD-10-CM | POA: Insufficient documentation

## 2014-04-13 DIAGNOSIS — I6523 Occlusion and stenosis of bilateral carotid arteries: Secondary | ICD-10-CM | POA: Diagnosis not present

## 2014-04-20 ENCOUNTER — Other Ambulatory Visit (HOSPITAL_COMMUNITY): Payer: Self-pay | Admitting: Cardiovascular Disease

## 2014-04-20 NOTE — Telephone Encounter (Signed)
Rx has been sent to the pharmacy electronically. ° °

## 2014-04-23 ENCOUNTER — Other Ambulatory Visit: Payer: Self-pay | Admitting: Internal Medicine

## 2014-05-15 IMAGING — CR DG CHEST 2V
2 series · 2 of 2 positions shown · non-contrast
Comparison: 06/25/2012

CLINICAL DATA: Lower mid chest pain for couple weeks, hypertension,
former smoker, malaise, fatigue, recent community-acquired
pneumonia, back pain, history COPD, coronary artery disease

CHEST - 2 VIEW

[view not recorded (1 of 2)]
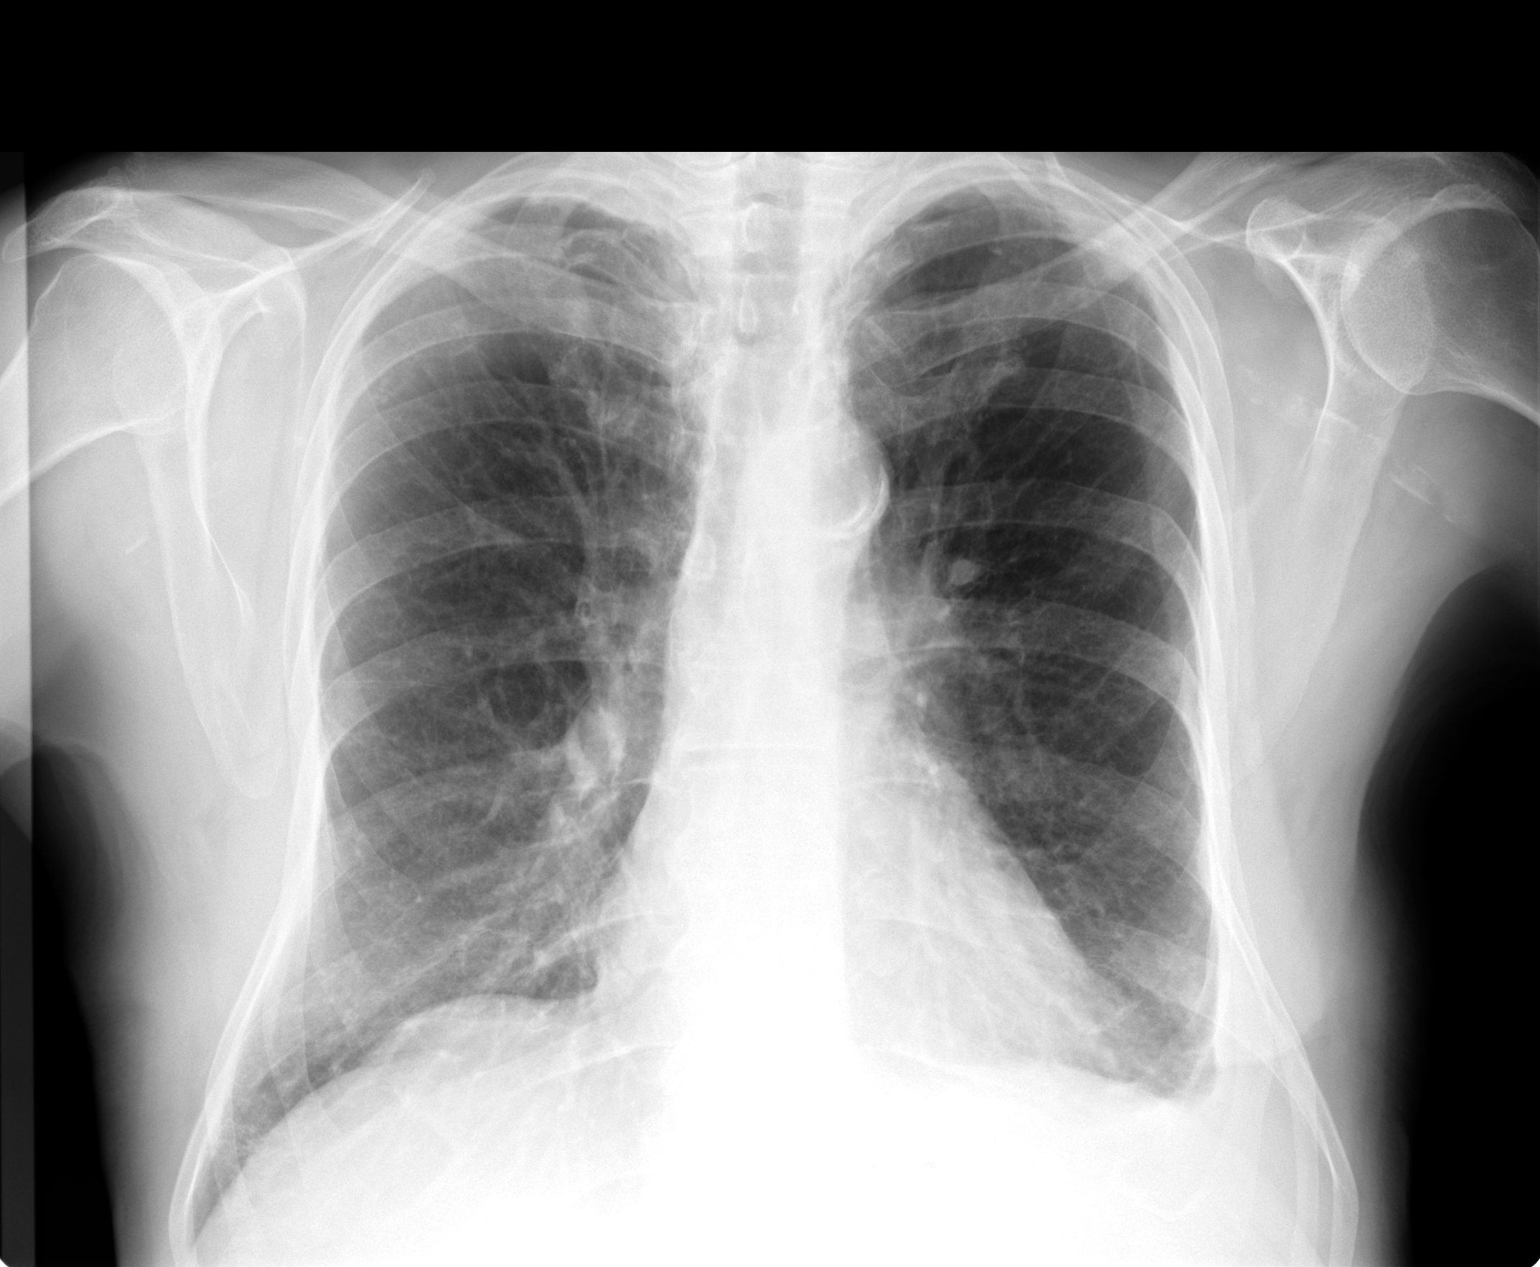

[view not recorded (2 of 2)]
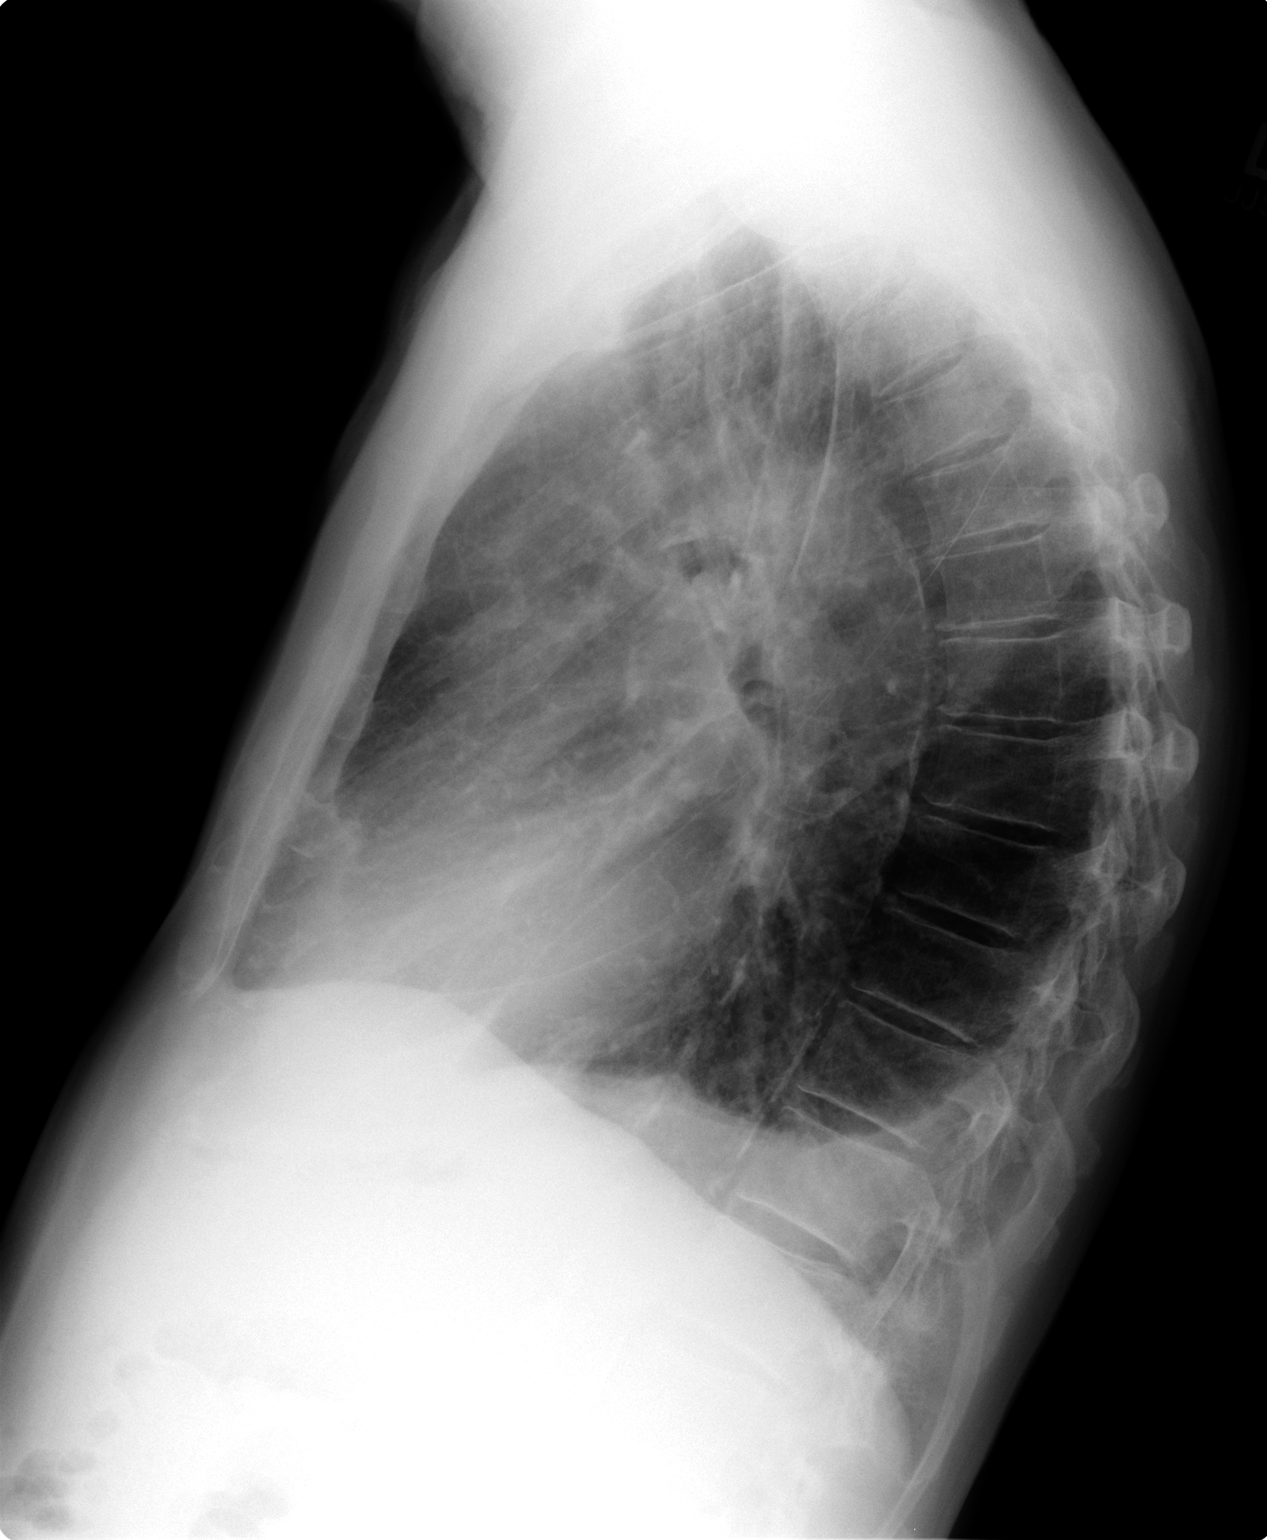

[2 of 2 positions shown; findings below may reference images not displayed]

FINDINGS: Normal heart size, mediastinal contours, and pulmonary vascularity.
Atherosclerotic calcification aorta.
Emphysematous and minimal bronchitic changes consistent with COPD.
Decreased left pleural effusion and basilar atelectasis since
previous exam.
Remaining lungs clear.
No pneumothorax or acute osseous findings.
IMPRESSION: Decreased left pleural effusion and basilar atelectasis.
Underlying COPD.

## 2014-07-03 ENCOUNTER — Telehealth: Payer: Self-pay | Admitting: *Deleted

## 2014-07-03 NOTE — Telephone Encounter (Signed)
LMTCB re; the possible scheduling of his Right CEA as discussed with patient and daughter at the 03-17-14 office visit with Dr. Arbie Cookey. Patient had repeat carotid duplex on 04-13-14 which showed 80-99% right internal carotid stenosis and 40-59% left internal carotid stenosis.  The family was to discuss surgery plan and call us back to schedule. As of this date, we have not heard anything from them.  Will try and contact one of his daughters also.

## 2014-07-23 ENCOUNTER — Other Ambulatory Visit: Payer: Self-pay | Admitting: Internal Medicine

## 2014-08-05 ENCOUNTER — Other Ambulatory Visit: Payer: Self-pay | Admitting: Cardiovascular Disease

## 2014-08-05 ENCOUNTER — Other Ambulatory Visit: Payer: Self-pay | Admitting: Internal Medicine

## 2014-08-05 NOTE — Telephone Encounter (Signed)
i do not see where this was filled by you--please advise if okay to refill

## 2014-08-05 NOTE — Telephone Encounter (Signed)
This is usually filled by his cardiologist

## 2014-08-05 NOTE — Telephone Encounter (Signed)
NEED OV. 

## 2014-09-14 NOTE — Progress Notes (Signed)
Phone call placed to pt. to f/u on scheduling surgery for 80-99 % (R) ICA Stenosis.  Spoke with pt's daughter, [name].  Reported that the pt. has decided not to have the surgery at this time.  Daughter stated "He seems to be functioning fine."  Advised to call office if pt. changes his mind, or is having any problems.  Verb. Understanding.

## 2014-10-30 ENCOUNTER — Ambulatory Visit (INDEPENDENT_AMBULATORY_CARE_PROVIDER_SITE_OTHER): Payer: Medicare Other | Admitting: Internal Medicine

## 2014-10-30 ENCOUNTER — Encounter: Payer: Self-pay | Admitting: Internal Medicine

## 2014-10-30 VITALS — BP 132/72 | HR 66 | Temp 98.3°F | Wt 142.0 lb

## 2014-10-30 DIAGNOSIS — I6523 Occlusion and stenosis of bilateral carotid arteries: Secondary | ICD-10-CM | POA: Diagnosis not present

## 2014-10-30 DIAGNOSIS — E785 Hyperlipidemia, unspecified: Secondary | ICD-10-CM

## 2014-10-30 DIAGNOSIS — I10 Essential (primary) hypertension: Secondary | ICD-10-CM | POA: Diagnosis not present

## 2014-10-30 DIAGNOSIS — N400 Enlarged prostate without lower urinary tract symptoms: Secondary | ICD-10-CM | POA: Diagnosis not present

## 2014-10-30 DIAGNOSIS — K219 Gastro-esophageal reflux disease without esophagitis: Secondary | ICD-10-CM

## 2014-10-30 DIAGNOSIS — Z23 Encounter for immunization: Secondary | ICD-10-CM

## 2014-10-30 DIAGNOSIS — I739 Peripheral vascular disease, unspecified: Secondary | ICD-10-CM

## 2014-10-30 DIAGNOSIS — G4700 Insomnia, unspecified: Secondary | ICD-10-CM

## 2014-10-30 DIAGNOSIS — J438 Other emphysema: Secondary | ICD-10-CM

## 2014-10-30 DIAGNOSIS — I779 Disorder of arteries and arterioles, unspecified: Secondary | ICD-10-CM

## 2014-10-30 NOTE — Progress Notes (Signed)
Pre visit review using our clinic review tool, if applicable. No additional management support is needed unless otherwise documented below in the visit note. 

## 2014-10-31 ENCOUNTER — Encounter: Payer: Self-pay | Admitting: Internal Medicine

## 2014-10-31 DIAGNOSIS — N400 Enlarged prostate without lower urinary tract symptoms: Secondary | ICD-10-CM | POA: Insufficient documentation

## 2014-10-31 DIAGNOSIS — G4700 Insomnia, unspecified: Secondary | ICD-10-CM | POA: Insufficient documentation

## 2014-10-31 MED ORDER — CLOPIDOGREL BISULFATE 75 MG PO TABS: 75.0000 mg | ORAL_TABLET | Freq: Every day | ORAL | Status: DC

## 2014-10-31 MED ORDER — CHLORTHALIDONE 25 MG PO TABS: 25.0000 mg | ORAL_TABLET | Freq: Every day | ORAL | Status: DC

## 2014-10-31 MED ORDER — HYDRALAZINE HCL 50 MG PO TABS: 75.0000 mg | ORAL_TABLET | Freq: Two times a day (BID) | ORAL | Status: DC

## 2014-10-31 MED ORDER — PANTOPRAZOLE SODIUM 40 MG PO TBEC: 40.0000 mg | DELAYED_RELEASE_TABLET | Freq: Every day | ORAL | Status: DC

## 2014-10-31 MED ORDER — CHOLINE FENOFIBRATE 135 MG PO CPDR: 135.0000 mg | DELAYED_RELEASE_CAPSULE | Freq: Every day | ORAL | Status: DC

## 2014-10-31 MED ORDER — LOSARTAN POTASSIUM 100 MG PO TABS: 100.0000 mg | ORAL_TABLET | Freq: Every day | ORAL | Status: DC

## 2014-10-31 MED ORDER — METOPROLOL SUCCINATE ER 25 MG PO TB24: ORAL_TABLET | ORAL | Status: DC

## 2014-10-31 MED ORDER — TRAZODONE HCL 50 MG PO TABS: 25.0000 mg | ORAL_TABLET | Freq: Every evening | ORAL | Status: DC | PRN

## 2014-10-31 MED ORDER — ATORVASTATIN CALCIUM 10 MG PO TABS: 10.0000 mg | ORAL_TABLET | Freq: Every day | ORAL | Status: DC

## 2014-10-31 MED ORDER — TAMSULOSIN HCL 0.4 MG PO CAPS: 0.4000 mg | ORAL_CAPSULE | Freq: Every day | ORAL | Status: DC

## 2014-10-31 NOTE — Assessment & Plan Note (Signed)
Controlled on Protonix daily

## 2014-10-31 NOTE — Progress Notes (Signed)
Subjective:    Patient ID: Gary [name], male    DOB: 1935-10-03, 79 y.o.   MRN: [id number]  HPI  Pt presents to the clinic today for follow up of chronic conditions.  CAD and HLD: S/P STEMI. He takes Toprol and ASA daily. His last LDL was 153. He daughter reports he does not take his Lipitor and Fenofibrate as he is supposed to because he often forgets to take his night time medication. He tries to a consume a low fat diet.  GERD: His symptoms are well controlled on Protonix. He denies breakthrough symptoms.  HTN: His BP is well controlled. His BP today is 132/72. He takes Chlortalidone, Hydralazine (twice daily instead of three times a day) and Losartan. His daughter reports he does not take Norvasc at night because he forgets to take his nightly medication.  Emphysema: He denies any shortness of breath.  He is concerned about difficulty sleeping. He reports he usually can fall asleep but he can not stay asleep. Part of the reason he has trouble is because he gets up about every hour to urinate. He denies dysuria. He does not feel like he has to strain to get his urine out. He is not sure if he has an enlarged prostate. He has been on Ambien in the past.  Review of Systems      Past Medical History  Diagnosis Date  . 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 Hypertension   . PVD (peripheral vascular disease) 04/27/2011    normal renal & abdominal aortography; s/p Left Fem-Pop BPG 2004.  Marland Kitchen Dyslipidemia   . Coronary artery disease 04/27/2011    Low risk Myoview in 2010:Non-obstructive disase, except for small OMB 95% ostial lesion  . H/O ETOH abuse   . GERD (gastroesophageal reflux disease)   . Alcohol abuse   . Carotid artery disease   . Pneumonia     Current Outpatient Prescriptions  Medication Sig Dispense Refill  . aspirin EC 81 MG tablet Take 81 mg by mouth at bedtime.    Marland Kitchen atorvastatin (LIPITOR) 10 MG tablet Take 1 tablet  (10 mg total) by mouth daily. 30 tablet 11  . chlorthalidone (HYGROTON) 25 MG tablet Take 1 tablet (25 mg total) by mouth daily. 30 tablet 11  . Choline Fenofibrate 135 MG capsule Take 135 mg by mouth daily.    . clopidogrel (PLAVIX) 75 MG tablet Take 1 tablet (75 mg total) by mouth daily. 30 tablet 11  . co-enzyme Q-10 30 MG capsule Take 30 mg by mouth daily.    . folic acid (FOLVITE) 1 MG tablet Take 1 tablet (1 mg total) by mouth daily. 30 tablet 11  . hydrALAZINE (APRESOLINE) 50 MG tablet TAKE 1.5 TABLETS (75 MG TOTAL) BY MOUTH 3 (THREE) TIMES DAILY. 135 tablet 11  . losartan (COZAAR) 100 MG tablet Take 1 tablet (100 mg total) by mouth daily. 30 tablet 11  . metoprolol succinate (TOPROL-XL) 25 MG 24 hr tablet TAKE 1 TABLET (25 MG TOTAL) BY MOUTH DAILY. 30 tablet 0  . pantoprazole (PROTONIX) 40 MG tablet Take 1 tablet (40 mg total) by mouth daily. 30 tablet 11  . vitamin B-12 (CYANOCOBALAMIN) 100 MCG tablet Take 100 mcg by mouth daily.     No current facility-administered medications for this visit.    Allergies  Allergen Reactions  . Contrast Media [Iodinated Diagnostic Agents] Itching  . Diovan [Valsartan] Swelling  angioedema  . Neosporin [Neomycin-Bacitracin Zn-Polymyx] Hives  . Neomycin Hives    Family History  Problem Relation Age of Onset  . CAD Brother     also stroke  . Hypertension Brother   . Diabetes Brother   . Hypertension Father     also stroke  . Stroke Father   . Hypertension Brother      X 2  . Kidney cancer Mother   . Cancer Mother     Renal  . Cancer Sister     Lung  . COPD Sister   . Hypertension Sister     Social History   Social History  . Marital Status: Divorced    Spouse Name: N/A  . Number of Children: 3  . Years of Education: N/A   Occupational History  . welder, pipe fitter-Retired    Social History Main Topics  . Smoking status: Former Smoker -- 1.00 packs/day for 50 years    Types: Cigarettes    Quit date: 03/03/1997  .  Smokeless tobacco: Former Neurosurgeon    Quit date: 04/27/1998  . Alcohol Use: 1.2 oz/week    2 Cans of beer per week     Comment: " daily beer generally"  . Drug Use: No  . Sexual Activity: No   Other Topics Concern  . Not on file   Social History Narrative     Constitutional: Denies fever, malaise, fatigue, headache or abrupt weight changes.  HEENT: Denies eye pain, eye redness, ear pain, ringing in the ears, wax buildup, runny nose, nasal congestion, bloody nose, or sore throat. Respiratory: Denies difficulty breathing, shortness of breath, cough or sputum production.   Cardiovascular: Denies chest pain, chest tightness, palpitations or swelling in the hands or feet.  Gastrointestinal: Denies abdominal pain, bloating, constipation, diarrhea or blood in the stool.  GU: Pt reports nocturia. Denies urgency, frequency, pain with urination, burning sensation, blood in urine, odor or discharge. Neurological: Pt reports insomnia. Denies dizziness, difficulty with memory, difficulty with speech or problems with balance and coordination.  Psych: Denies anxiety, depression, SI/HI.  No other specific complaints in a complete review of systems (except as listed in HPI above).  Objective:   Physical Exam   BP 132/72 mmHg  Pulse 66  Temp(Src) 98.3 F (36.8 C) (Oral)  Wt 142 lb (64.411 kg)  SpO2 97% Wt Readings from Last 3 Encounters:  10/30/14 142 lb (64.411 kg)  03/17/14 133 lb 9.6 oz (60.601 kg)  01/16/14 133 lb (60.328 kg)    General: Appears his stated age, chronically ill appearing in NAD. Skin: Warm, dry and intact. No rashes, lesions or ulcerations noted. Cardiovascular: Normal rate and rhythm. S1,S2 noted.   Pulmonary/Chest: Normal effort and positive vesicular breath sounds. No respiratory distress. No wheezes, rales or ronchi noted.  Abdomen: Soft and nontender. Neurological: Alert and oriented.  Psychiatric: He is pleasant and engages fully.  BMET    Component Value  Date/Time   NA 137 11/22/2013 0506   K 3.6* 11/22/2013 0506   CL 101 11/22/2013 0506   CO2 21 11/22/2013 0506   GLUCOSE 72 11/22/2013 0506   BUN 23 11/22/2013 0506   CREATININE 0.69 11/24/2013 0350   CREATININE 0.73 07/28/2013 0934   CALCIUM 8.4 11/22/2013 0506   GFRNONAA 90* 11/24/2013 0350   GFRAA >90 11/24/2013 0350    Lipid Panel     Component Value Date/Time   CHOL 243* 07/23/2012 1207   TRIG 114 07/23/2012 1207   HDL 64 07/23/2012  1207   CHOLHDL 3.8 07/23/2012 1207   VLDL 23 07/23/2012 1207   LDLCALC 156* 07/23/2012 1207    CBC    Component Value Date/Time   WBC 6.0 11/22/2013 0506   RBC 3.04* 11/22/2013 0506   HGB 9.4* 11/22/2013 0506   HCT 27.5* 11/22/2013 0506   PLT 243 11/22/2013 0506   MCV 90.5 11/22/2013 0506   MCH 30.9 11/22/2013 0506   MCHC 34.2 11/22/2013 0506   RDW 14.0 11/22/2013 0506   LYMPHSABS 1.3 05/01/2013 1518   MONOABS 1.1* 05/01/2013 1518   EOSABS 0.0 05/01/2013 1518   BASOSABS 0.1 05/01/2013 1518    Hgb A1C Lab Results  Component Value Date   HGBA1C 5.0 01/16/2014        Assessment & Plan:

## 2014-10-31 NOTE — Patient Instructions (Signed)
Benign Prostatic Hyperplasia An enlarged prostate (benign prostatic hyperplasia) is common in older men. You may experience the following:  Weak urine stream.  Dribbling.  Feeling like the bladder has not emptied completely.  Difficulty starting urination.  Getting up frequently at night to urinate.  Urinating more frequently during the day. HOME CARE INSTRUCTIONS  Monitor your prostatic hyperplasia for any changes. The following actions may help to alleviate any discomfort you are experiencing:  Give yourself time when you urinate.  Stay away from alcohol.  Avoid beverages containing caffeine, such as coffee, tea, and colas, because they can make the problem worse.  Avoid decongestants, antihistamines, and some prescription medicines that can make the problem worse.  Follow up with your health care provider for further treatment as recommended. SEEK MEDICAL CARE IF:  You are experiencing progressive difficulty voiding.  Your urine stream is progressively getting narrower.  You are awaking from sleep with the urge to void more frequently.  You are constantly feeling the need to void.  You experience loss of urine, especially in small amounts. SEEK IMMEDIATE MEDICAL CARE IF:   You develop increased pain with urination or are unable to urinate.  You develop severe abdominal pain, vomiting, a high fever, or fainting.  You develop back pain or blood in your urine. MAKE SURE YOU:   Understand these instructions.  Will watch your condition.  Will get help right away if you are not doing well or get worse. Document Released: 01/23/2005 Document Revised: 09/25/2012 Document Reviewed: 06/25/2012 Exit[employer] Patient Information 2015 [employer]. This information is not intended to replace advice given to you by your health care provider. Make sure you discuss any questions you have with your health care provider.  

## 2014-10-31 NOTE — Assessment & Plan Note (Signed)
Stop Amlodipine Continue Chlorthalidone, Losartan and Hydralazine

## 2014-10-31 NOTE — Assessment & Plan Note (Signed)
He declines repeat labs  Continue Lipitor and Fenofibrate

## 2014-10-31 NOTE — Assessment & Plan Note (Signed)
Start Trazadone RX sent to pharmacy

## 2014-10-31 NOTE — Assessment & Plan Note (Signed)
Asymptomatic Will continue to monitor 

## 2014-10-31 NOTE — Assessment & Plan Note (Signed)
No angina LDL not at goal last time it was checked He does not want labs today Continue Lipitor and Fenofibrate

## 2014-10-31 NOTE — Assessment & Plan Note (Signed)
Start Flomax RX sent to pharmacy

## 2015-04-15 ENCOUNTER — Encounter: Payer: Self-pay | Admitting: Internal Medicine

## 2015-04-15 ENCOUNTER — Ambulatory Visit (INDEPENDENT_AMBULATORY_CARE_PROVIDER_SITE_OTHER): Payer: Medicare Other | Admitting: Internal Medicine

## 2015-04-15 VITALS — BP 136/68 | HR 96 | Temp 98.6°F | Wt 130.0 lb

## 2015-04-15 DIAGNOSIS — J441 Chronic obstructive pulmonary disease with (acute) exacerbation: Secondary | ICD-10-CM

## 2015-04-15 MED ORDER — PREDNISONE 10 MG PO TABS: ORAL_TABLET | ORAL | Status: DC

## 2015-04-15 MED ORDER — HYDROCODONE-HOMATROPINE 5-1.5 MG/5ML PO SYRP: 5.0000 mL | ORAL_SOLUTION | Freq: Three times a day (TID) | ORAL | Status: DC | PRN

## 2015-04-15 MED ORDER — LEVOFLOXACIN 500 MG PO TABS: 500.0000 mg | ORAL_TABLET | Freq: Every day | ORAL | Status: DC

## 2015-04-15 NOTE — Patient Instructions (Signed)

## 2015-04-15 NOTE — Progress Notes (Signed)
Pre visit review using our clinic review tool, if applicable. No additional management support is needed unless otherwise documented below in the visit note. 

## 2015-04-15 NOTE — Progress Notes (Signed)
HPI  Pt presents to the clinic today with c/o nasal congestion and cough. This started 4-5 days ago. He is blowing yellow mucous out of his nose. The cough is non productive. He is mildly short of breath and has a sharp pain in his back, right side, when he takes a deep breath. He denies fever, but has felt fatigued and had loss of appetite. He denies chest pain. He has tried Robitussin and Mucinex with minimal relief. He has a history of COPD, but is not on any inhalers. He is UTD on his flu and pneumonia vaccines. He has not had sick, contacts that he is aware of.  Review of Systems      Past Medical History  Diagnosis Date  . COPD (chronic obstructive pulmonary disease) (HCC)     emphysematous changes on past CT  . Hypertension   . PVD (peripheral vascular disease) (HCC) 04/27/2011    normal renal & abdominal aortography; s/p Left Fem-Pop BPG 2004.  Marland Kitchen Dyslipidemia   . Coronary artery disease 04/27/2011    Low risk Myoview in 2010:Non-obstructive disase, except for small OMB 95% ostial lesion  . H/O ETOH abuse   . GERD (gastroesophageal reflux disease)   . Alcohol abuse   . Carotid artery disease (HCC)   . Pneumonia     Family History  Problem Relation Age of Onset  . CAD Brother     also stroke  . Hypertension Brother   . Diabetes Brother   . Hypertension Father     also stroke  . Stroke Father   . Hypertension Brother      X 2  . Kidney cancer Mother   . Cancer Mother     Renal  . Cancer Sister     Lung  . COPD Sister   . Hypertension Sister     Social History   Social History  . Marital Status: Divorced    Spouse Name: N/A  . Number of Children: 3  . Years of Education: N/A   Occupational History  . welder, pipe fitter-Retired    Social History Main Topics  . Smoking status: Former Smoker -- 1.00 packs/day for 50 years    Types: Cigarettes    Quit date: 03/03/1997  . Smokeless tobacco: Former Neurosurgeon    Quit date: 04/27/1998  . Alcohol Use: 1.2 oz/week    2 Cans of beer per week     Comment: " daily beer generally"  . Drug Use: No  . Sexual Activity: No   Other Topics Concern  . Not on file   Social History Narrative    Allergies  Allergen Reactions  . Contrast Media [Iodinated Diagnostic Agents] Itching  . Diovan [Valsartan] Swelling    angioedema  . Neosporin [Neomycin-Bacitracin Zn-Polymyx] Hives  . Neomycin Hives     Constitutional: Positive fatigue. Denies headache, fever or weight changes.  HEENT:  Positive nasal congestoin. Denies eye redness, eye pain, pressure behind the eyes, facial pain, ear pain, ringing in the ears, wax buildup, runny nose or sore throat. Respiratory: Positive cough and shortness of breath. Denies difficulty breathing.  Cardiovascular: Denies chest pain, chest tightness, palpitations or swelling in the hands or feet.   No other specific complaints in a complete review of systems (except as listed in HPI above).  Objective:   BP 136/68 mmHg  Pulse 96  Temp(Src) 98.6 F (37 C) (Oral)  Wt 130 lb (58.968 kg)  SpO2 96% Wt Readings from Last 3 Encounters:  04/15/15 130 lb (58.968 kg)  10/30/14 142 lb (64.411 kg)  03/17/14 133 lb 9.6 oz (60.601 kg)     General: Appears his stated age, ill appearing in NAD. HEENT: Head: normal shape and size; Eyes: sclera white, no icterus, conjunctiva pink; Ears: Tm's gray and intact, normal light reflex; Nose: mucosa pink and moist, septum midline; Throat/Mouth:Teeth present, mucosa erythematous and moist, no exudate noted, no lesions or ulcerations noted.  Neck: Cervical lymphadenopathy, R>L.  Cardiovascular: Normal rate and rhythm. S1,S2 noted. Murmur noted. Pulmonary/Chest: Normal effort and rhonchi and expiratory wheezing noted in the RML. No respiratory distress.      Assessment & Plan:   COPD exacerbation:  Get some rest and drink plenty of water Tylenol as needed for pain eRx for Levaquin x 10 days eRx for Pred Taper x 6 days eRx for Hycodan  cough syrup  RTC as needed or if symptoms persist.

## 2015-04-18 ENCOUNTER — Emergency Department (HOSPITAL_COMMUNITY): Payer: Medicare Other

## 2015-04-18 ENCOUNTER — Encounter (HOSPITAL_COMMUNITY): Payer: Self-pay | Admitting: Emergency Medicine

## 2015-04-18 ENCOUNTER — Inpatient Hospital Stay (HOSPITAL_COMMUNITY)
Admission: EM | Admit: 2015-04-18 | Discharge: 2015-04-21 | DRG: 683 | Disposition: A | Discharge status: Home / Self Care | Payer: Medicare Other | Attending: Internal Medicine | Admitting: Internal Medicine

## 2015-04-18 DIAGNOSIS — Z7982 Long term (current) use of aspirin: Secondary | ICD-10-CM

## 2015-04-18 DIAGNOSIS — F1290 Cannabis use, unspecified, uncomplicated: Secondary | ICD-10-CM | POA: Diagnosis present

## 2015-04-18 DIAGNOSIS — Z91041 Radiographic dye allergy status: Secondary | ICD-10-CM

## 2015-04-18 DIAGNOSIS — E440 Moderate protein-calorie malnutrition: Secondary | ICD-10-CM | POA: Diagnosis present

## 2015-04-18 DIAGNOSIS — E785 Hyperlipidemia, unspecified: Secondary | ICD-10-CM | POA: Diagnosis present

## 2015-04-18 DIAGNOSIS — M954 Acquired deformity of chest and rib: Secondary | ICD-10-CM | POA: Diagnosis not present

## 2015-04-18 DIAGNOSIS — E871 Hypo-osmolality and hyponatremia: Secondary | ICD-10-CM | POA: Diagnosis present

## 2015-04-18 DIAGNOSIS — E876 Hypokalemia: Secondary | ICD-10-CM | POA: Diagnosis present

## 2015-04-18 DIAGNOSIS — N179 Acute kidney failure, unspecified: Principal | ICD-10-CM | POA: Diagnosis present

## 2015-04-18 DIAGNOSIS — R55 Syncope and collapse: Secondary | ICD-10-CM | POA: Diagnosis present

## 2015-04-18 DIAGNOSIS — J449 Chronic obstructive pulmonary disease, unspecified: Secondary | ICD-10-CM | POA: Diagnosis not present

## 2015-04-18 DIAGNOSIS — D649 Anemia, unspecified: Secondary | ICD-10-CM | POA: Diagnosis present

## 2015-04-18 DIAGNOSIS — I2510 Atherosclerotic heart disease of native coronary artery without angina pectoris: Secondary | ICD-10-CM | POA: Diagnosis present

## 2015-04-18 DIAGNOSIS — Z8249 Family history of ischemic heart disease and other diseases of the circulatory system: Secondary | ICD-10-CM | POA: Diagnosis not present

## 2015-04-18 DIAGNOSIS — R404 Transient alteration of awareness: Secondary | ICD-10-CM | POA: Diagnosis not present

## 2015-04-18 DIAGNOSIS — F1010 Alcohol abuse, uncomplicated: Secondary | ICD-10-CM | POA: Diagnosis present

## 2015-04-18 DIAGNOSIS — I10 Essential (primary) hypertension: Secondary | ICD-10-CM | POA: Diagnosis not present

## 2015-04-18 DIAGNOSIS — N189 Chronic kidney disease, unspecified: Secondary | ICD-10-CM

## 2015-04-18 DIAGNOSIS — R05 Cough: Secondary | ICD-10-CM | POA: Diagnosis not present

## 2015-04-18 DIAGNOSIS — E860 Dehydration: Secondary | ICD-10-CM | POA: Diagnosis present

## 2015-04-18 DIAGNOSIS — Z6822 Body mass index (BMI) 22.0-22.9, adult: Secondary | ICD-10-CM | POA: Diagnosis not present

## 2015-04-18 DIAGNOSIS — K219 Gastro-esophageal reflux disease without esophagitis: Secondary | ICD-10-CM | POA: Diagnosis present

## 2015-04-18 DIAGNOSIS — J40 Bronchitis, not specified as acute or chronic: Secondary | ICD-10-CM | POA: Diagnosis present

## 2015-04-18 DIAGNOSIS — N289 Disorder of kidney and ureter, unspecified: Secondary | ICD-10-CM | POA: Diagnosis not present

## 2015-04-18 DIAGNOSIS — N400 Enlarged prostate without lower urinary tract symptoms: Secondary | ICD-10-CM | POA: Diagnosis present

## 2015-04-18 DIAGNOSIS — R7989 Other specified abnormal findings of blood chemistry: Secondary | ICD-10-CM

## 2015-04-18 DIAGNOSIS — R748 Abnormal levels of other serum enzymes: Secondary | ICD-10-CM | POA: Diagnosis not present

## 2015-04-18 DIAGNOSIS — R778 Other specified abnormalities of plasma proteins: Secondary | ICD-10-CM | POA: Diagnosis present

## 2015-04-18 DIAGNOSIS — Z87891 Personal history of nicotine dependence: Secondary | ICD-10-CM

## 2015-04-18 DIAGNOSIS — I739 Peripheral vascular disease, unspecified: Secondary | ICD-10-CM | POA: Diagnosis not present

## 2015-04-18 DIAGNOSIS — Z888 Allergy status to other drugs, medicaments and biological substances status: Secondary | ICD-10-CM

## 2015-04-18 LAB — COMPREHENSIVE METABOLIC PANEL
ALBUMIN: 3.1 g/dL — AB (ref 3.5–5.0)
ALT: 24 U/L (ref 17–63)
AST: 33 U/L (ref 15–41)
Alkaline Phosphatase: 24 U/L — ABNORMAL LOW (ref 38–126)
Anion gap: 15 (ref 5–15)
BUN: 35 mg/dL — AB (ref 6–20)
CHLORIDE: 93 mmol/L — AB (ref 101–111)
CO2: 22 mmol/L (ref 22–32)
CREATININE: 1.73 mg/dL — AB (ref 0.61–1.24)
Calcium: 8.3 mg/dL — ABNORMAL LOW (ref 8.9–10.3)
GFR calc Af Amer: 42 mL/min — ABNORMAL LOW (ref >60)
GFR calc non Af Amer: 36 mL/min — ABNORMAL LOW (ref >60)
GLUCOSE: 99 mg/dL (ref 65–99)
Potassium: 3 mmol/L — ABNORMAL LOW (ref 3.5–5.1)
SODIUM: 130 mmol/L — AB (ref 135–145)
Total Bilirubin: 0.6 mg/dL (ref 0.3–1.2)
Total Protein: 5.4 g/dL — ABNORMAL LOW (ref 6.5–8.1)

## 2015-04-18 LAB — RAPID URINE DRUG SCREEN, HOSP PERFORMED
AMPHETAMINES: NOT DETECTED
Barbiturates: NOT DETECTED
Benzodiazepines: NOT DETECTED
Cocaine: NOT DETECTED
Opiates: POSITIVE — AB
TETRAHYDROCANNABINOL: POSITIVE — AB

## 2015-04-18 LAB — CBC WITH DIFFERENTIAL/PLATELET
BASOS ABS: 0 10*3/uL (ref 0.0–0.1)
BASOS PCT: 0 %
EOS ABS: 0 10*3/uL (ref 0.0–0.7)
EOS PCT: 0 %
HCT: 33.7 % — ABNORMAL LOW (ref 39.0–52.0)
Hemoglobin: 11.2 g/dL — ABNORMAL LOW (ref 13.0–17.0)
LYMPHS PCT: 16 %
Lymphs Abs: 0.7 10*3/uL (ref 0.7–4.0)
MCH: 29.2 pg (ref 26.0–34.0)
MCHC: 33.2 g/dL (ref 30.0–36.0)
MCV: 88 fL (ref 78.0–100.0)
Monocytes Absolute: 0.4 10*3/uL (ref 0.1–1.0)
Monocytes Relative: 9 %
Neutro Abs: 3.3 10*3/uL (ref 1.7–7.7)
Neutrophils Relative %: 75 %
PLATELETS: 169 10*3/uL (ref 150–400)
RBC: 3.83 MIL/uL — AB (ref 4.22–5.81)
RDW: 13.2 % (ref 11.5–15.5)
WBC: 4.5 10*3/uL (ref 4.0–10.5)

## 2015-04-18 LAB — URINALYSIS, ROUTINE W REFLEX MICROSCOPIC
BILIRUBIN URINE: NEGATIVE
Glucose, UA: NEGATIVE mg/dL
HGB URINE DIPSTICK: NEGATIVE
KETONES UR: NEGATIVE mg/dL
Leukocytes, UA: NEGATIVE
NITRITE: NEGATIVE
PH: 5.5 (ref 5.0–8.0)
Protein, ur: 100 mg/dL — AB
Specific Gravity, Urine: 1.015 (ref 1.005–1.030)

## 2015-04-18 LAB — URINE MICROSCOPIC-ADD ON

## 2015-04-18 LAB — I-STAT TROPONIN, ED: TROPONIN I, POC: 0.11 ng/mL — AB (ref 0.00–0.08)

## 2015-04-18 LAB — ETHANOL: Alcohol, Ethyl (B): 85 mg/dL — ABNORMAL HIGH (ref <5)

## 2015-04-18 MED ORDER — ACETAMINOPHEN 650 MG RE SUPP: 650.0000 mg | Freq: Four times a day (QID) | RECTAL | Status: DC | PRN

## 2015-04-18 MED ORDER — ACETAMINOPHEN 325 MG PO TABS
650.0000 mg | ORAL_TABLET | Freq: Four times a day (QID) | ORAL | Status: DC | PRN
  Administered 2015-04-18 – 2015-04-20 (×3): 650 mg via ORAL
  Filled 2015-04-18 (×3): qty 2

## 2015-04-18 MED ORDER — POTASSIUM CHLORIDE CRYS ER 20 MEQ PO TBCR
40.0000 meq | EXTENDED_RELEASE_TABLET | Freq: Once | ORAL | Status: AC
  Administered 2015-04-18: 40 meq via ORAL
  Filled 2015-04-18: qty 2

## 2015-04-18 NOTE — ED Provider Notes (Signed)
CSN: [id number]     Arrival date & time 04/18/15  1809 History   First MD Initiated Contact with Patient 04/18/15 1823     Chief Complaint  Patient presents with  . Alcohol Intoxication  . Loss of Consciousness    Patient is a 80 y.o. male presenting with intoxication and syncope. The history is provided by the patient.  Alcohol Intoxication Pertinent negatives include no chest pain and no shortness of breath.  Loss of Consciousness Associated symptoms: no chest pain and no shortness of breath   Patient was brought in from the bar for syncope. Reportedly was unresponsive on the bar and don't know if it was caused by alcohol or something else. Denies chest pain. Denies fevers or chills. States he has had a cough and had been on antibiotics from her primary care doctor. States he has a little cough but is improved. States he did have some back pain then but is improved. States he is taking some codeine cough medicine but was hallucinating so stopped taking it.  Past Medical History  Diagnosis Date  . COPD (chronic obstructive pulmonary disease) (HCC)     emphysematous changes on past CT  . Hypertension   . PVD (peripheral vascular disease) (HCC) 04/27/2011    normal renal & abdominal aortography; s/p Left Fem-Pop BPG 2004.  Marland Kitchen Dyslipidemia   . Coronary artery disease 04/27/2011    Low risk Myoview in 2010:Non-obstructive disase, except for small OMB 95% ostial lesion  . H/O ETOH abuse   . GERD (gastroesophageal reflux disease)   . Alcohol abuse   . Carotid artery disease (HCC)   . Pneumonia    Past Surgical History  Procedure Laterality Date  . Foot surgery    . Nasal sinus surgery    . Femoral-popliteal bypass graft Left 11/05/2002    Dr. Josephina Gip  . Transthoracic echocardiogram  08/14/2012    EF 55-60%, mod LVH, grade 1 diastolic dysfunction; vent septum thickness mod increased; calcified MV annulus & mild MR; RV systolic pressure increased - mild pulm HTN; PA peak pressure   . Nm myocar perf wall motion  01/2009    dipyridamole; normal pattern of perfusion in all regions; post-stress EF 75%; normal study, low risk   . Cardiac catheterization  04/27/2011    normal L main & LAD; L Cfx with minor irregularities, small marginal branch with 95% osital stenosis, RCA is nondominant & normal, LVEF 60% (Dr. Erlene Quan)  . Lower extremity arterial doppler  08/2012    R CFA & Profunda Femoral - diffuse irregular mixed density plaque; R SFA distal 70-99% diameter reduction; R PTA appears occluded; L CFA distal/Prox Anastomosis 70-99% diameter reduction; L Fem-Pop BPG open & patent; bilat TBBIs with inadequate perfusion for healing   . Renal doppler  05/2012    SMA - elevated velocities >60% diameter reduction; bilat renal arteries with 60-99% diameter reduction; R & L kidneys are normal in size & symmetrical  . Carotid doppler  01/2010    R subclavian 50-69% diameter reduction, R prox ICA 50-69% diameter reduction, L prox ICA 50-69% diameter reduction  . Renal angiogram  06/25/2012    normal renal arteries  . Lower extremity angiogram  10/13/2010    patent left fem-pop bypass graft with moderate calcified prox disease in distal common femoral just prox to graft anastomosis, mod high-grade segmental mid and distal R SFA disease, 2-3 vessel runoff with patent diffusely disease posterior tibilalis bilaterally (Dr. Erlene Quan)  .  Colonoscopy    . Endarterectomy femoral Right 11/17/2013    Procedure: ENDARTERECTOMY FEMORAL;  Surgeon: Larina Earthly, MD;  Location[facility] OR;  Service: Vascular;  Laterality: Right;  . Femoral-popliteal bypass graft Right 11/17/2013    Procedure: BYPASS GRAFT FEMORAL-POPLITEAL ARTERY;  Surgeon: Larina Earthly, MD;  Location: [facility] OR;  Service: Vascular;  Laterality: Right;  . Left heart catheterization with coronary angiogram N/A 04/27/2011    Procedure: LEFT HEART CATHETERIZATION WITH CORONARY ANGIOGRAM;  Surgeon: Runell Gess, MD;  Location: [facility] CATH LAB;  Service:  Cardiovascular;  Laterality: N/A;  . Abdominal aortagram N/A 06/25/2012    Procedure: ABDOMINAL AORTAGRAM;  Surgeon: Runell Gess, MD;  Location: [facility] CATH LAB;  Service: Cardiovascular;  Laterality: N/A;  . Lower extremity angiogram N/A 08/04/2013    Procedure: LOWER EXTREMITY ANGIOGRAM;  Surgeon: Runell Gess, MD;  Location: [facility] CATH LAB;  Service: Cardiovascular;  Laterality: N/A;  . Carotid angiogram N/A 08/04/2013    Procedure: CAROTID ANGIOGRAM;  Surgeon: Runell Gess, MD;  Location: [facility] CATH LAB;  Service: Cardiovascular;  Laterality: N/A;  . Visceral angiogram N/A 11/19/2013    Procedure: VISCERAL ANGIOGRAM;  Surgeon: Fransisco Hertz, MD;  Location: [facility] CATH LAB;  Service: Cardiovascular;  Laterality: N/A;  . Percutaneous stent intervention  11/19/2013    Procedure: PERCUTANEOUS STENT INTERVENTION;  Surgeon: Fransisco Hertz, MD;  Location: [facility] CATH LAB;  Service: Cardiovascular;;  superior mesenteric artery   Family History  Problem Relation Age of Onset  . CAD Brother     also stroke  . Hypertension Brother   . Diabetes Brother   . Hypertension Father     also stroke  . Stroke Father   . Hypertension Brother      X 2  . Kidney cancer Mother   . Cancer Mother     Renal  . Cancer Sister     Lung  . COPD Sister   . Hypertension Sister    Social History  Substance Use Topics  . Smoking status: Former Smoker -- 1.00 packs/day for 50 years    Types: Cigarettes    Quit date: 03/03/1997  . Smokeless tobacco: Former Neurosurgeon    Quit date: 04/27/1998  . Alcohol Use: 1.2 oz/week    2 Cans of beer per week     Comment: " daily beer generally"    Review of Systems  Constitutional: Negative for appetite change.  Respiratory: Negative for shortness of breath.   Cardiovascular: Positive for syncope. Negative for chest pain.  Musculoskeletal: Negative for back pain.  Neurological: Positive for syncope.      Allergies  Contrast media; Diovan; Neosporin; and Neomycin  Home  Medications   Prior to Admission medications   Medication Sig Start Date End Date Taking? Authorizing Provider  aspirin EC 81 MG tablet Take 81 mg by mouth at bedtime.    Historical Provider, MD  atorvastatin (LIPITOR) 10 MG tablet Take 1 tablet (10 mg total) by mouth daily. 10/31/14   Lorre Munroe, NP  chlorthalidone (HYGROTON) 25 MG tablet Take 1 tablet (25 mg total) by mouth daily. 10/31/14   Lorre Munroe, NP  Choline Fenofibrate 135 MG capsule Take 1 capsule (135 mg total) by mouth daily. 10/31/14   Lorre Munroe, NP  clopidogrel (PLAVIX) 75 MG tablet Take 1 tablet (75 mg total) by mouth daily. 10/31/14   Lorre Munroe, NP  co-enzyme Q-10 30 MG capsule Take 30 mg by mouth daily.  Historical Provider, MD  folic acid (FOLVITE) 1 MG tablet Take 1 tablet (1 mg total) by mouth daily. 03/05/14   Runell Gess, MD  hydrALAZINE (APRESOLINE) 50 MG tablet Take 1.5 tablets (75 mg total) by mouth 2 (two) times daily. 10/31/14   Lorre Munroe, NP  HYDROcodone-homatropine [facility]) 5-1.5 MG/5ML syrup Take 5 mLs by mouth every 8 (eight) hours as needed for cough. 04/15/15   Lorre Munroe, NP  levofloxacin (LEVAQUIN) 500 MG tablet Take 1 tablet (500 mg total) by mouth daily. 04/15/15   Lorre Munroe, NP  metoprolol succinate (TOPROL-XL) 25 MG 24 hr tablet TAKE 1 TABLET (25 MG TOTAL) BY MOUTH DAILY. 10/31/14   Lorre Munroe, NP  pantoprazole (PROTONIX) 40 MG tablet Take 1 tablet (40 mg total) by mouth daily. 10/31/14   Lorre Munroe, NP  predniSONE (DELTASONE) 10 MG tablet Take 3 tabs on days 1-2, take 2 tabs on days 3-4, take 1 tab on days 5-6 04/15/15   Lorre Munroe, NP  tamsulosin (FLOMAX) 0.4 MG CAPS capsule Take 1 capsule (0.4 mg total) by mouth daily. 10/31/14   Lorre Munroe, NP  traZODone (DESYREL) 50 MG tablet Take 0.5-1 tablets (25-50 mg total) by mouth at bedtime as needed for sleep. 10/31/14   Lorre Munroe, NP  vitamin B-12 (CYANOCOBALAMIN) 100 MCG tablet Take 100 mcg by mouth daily.     Historical Provider, MD   BP 142/81 mmHg  Pulse 88  Temp(Src) 97.7 F (36.5 C) (Oral)  Resp 25  SpO2 96% Physical Exam  Constitutional: He appears well-developed.  HENT:  Head: Atraumatic.  Neck: Neck supple.  Cardiovascular: Normal rate.   Pulmonary/Chest: Effort normal.  Abdominal: Soft. He exhibits no distension.  Musculoskeletal: He exhibits no edema.  Neurological: He is alert.  Skin: Skin is warm.    ED Course  Procedures (including critical care time) Labs Review Labs Reviewed  CBC WITH DIFFERENTIAL/PLATELET - Abnormal; Notable for the following:    RBC 3.83 (*)    Hemoglobin 11.2 (*)    HCT 33.7 (*)    All other components within normal limits  COMPREHENSIVE METABOLIC PANEL - Abnormal; Notable for the following:    Sodium 130 (*)    Potassium 3.0 (*)    Chloride 93 (*)    BUN 35 (*)    Creatinine, Ser 1.73 (*)    Calcium 8.3 (*)    Total Protein 5.4 (*)    Albumin 3.1 (*)    Alkaline Phosphatase 24 (*)    GFR calc non Af Amer 36 (*)    GFR calc Af Amer 42 (*)    All other components within normal limits  ETHANOL - Abnormal; Notable for the following:    Alcohol, Ethyl (B) 85 (*)    All other components within normal limits  URINE RAPID DRUG SCREEN, HOSP PERFORMED - Abnormal; Notable for the following:    Opiates POSITIVE (*)    Tetrahydrocannabinol POSITIVE (*)    All other components within normal limits  URINALYSIS, ROUTINE W REFLEX MICROSCOPIC (NOT AT [facility]) - Abnormal; Notable for the following:    Protein, ur 100 (*)    All other components within normal limits  URINE MICROSCOPIC-ADD ON - Abnormal; Notable for the following:    Squamous Epithelial / LPF 0-5 (*)    Bacteria, UA FEW (*)    Casts HYALINE CASTS (*)    All other components within normal limits  I-STAT TROPOININ, ED -  Abnormal; Notable for the following:    Troponin i, poc 0.11 (*)    All other components within normal limits    Imaging Review Dg Chest 2 View  04/18/2015   CLINICAL DATA:  Acute onset of syncope.  Cough.  Initial encounter. EXAM: CHEST  2 VIEW COMPARISON:  Chest radiograph performed 06/17/2013, and CT of the chest performed 05/15/2013 FINDINGS: The lungs are mildly hyperexpanded, with flattening of the hemidiaphragms, compatible with COPD. Mild peribronchial thickening is noted. Scattered vascular calcifications are seen. Mild scarring is noted at the lung apices. The heart is normal in size. No acute osseous abnormalities are identified. Chronic left upper rib deformity is noted. IMPRESSION: Findings of COPD. Mild peribronchial thickening noted. Mild scarring at the lung apices. Electronically Signed   By: Roanna Raider M.D.   On: 04/18/2015 19:23   I have personally reviewed and evaluated these images and lab results as part of my medical decision-making.   EKG Interpretation   Date/Time:  Sunday April 18 2015 18:21:12 EDT Ventricular Rate:  74 PR Interval:    QRS Duration: 87 QT Interval:  373 QTC Calculation: 414 R Axis:   46 Text Interpretation:  Sinus rhythm LVH with secondary repolarization  abnormality Confirmed by Rubin Payor  MD, Harrold Donath [phone]) on 04/18/2015  6:26:49 PM      MDM   Final diagnoses:  Syncope, unspecified syncope type  Renal insufficiency    Patient presents with syncope. LVH with repolarization on EKG. May be somewhat worsened baseline but not all that much change. Patient's had no chest pain. Has had a low risk heart cath in the past. Troponin mildly elevated. Creatinine mildly elevated also. Will admit to internal medicine.    Benjiman Core, MD 04/18/15 2147

## 2015-04-18 NOTE — ED Notes (Signed)
Patient is resting comfortably, he did ask about his daughter.  I advised him I did not know if his daughter was on her way or not but I would call her.

## 2015-04-18 NOTE — ED Notes (Addendum)
Per EMS- pt was at a bar that he usually goes to when staff noticed him "sleeping" at a bar table. Staff went to check on him and laid him on the ground and called EMS. Pt has been drinking and smoking marijuana. Unsure if patient had a syncopal episode or was just passed out. Pt does have an irregular EKG. Pt has no memory of what happened just remembers waking up on the floor with staff over head.

## 2015-04-18 NOTE — H&P (Signed)
PCP:   Nicki Reaper, NP   Chief Complaint:  Syncope  HPI: This is a 80 year old gentleman who was been feeling poorly all week. On Thursday he saw his PCP/NP who diagnosed him with bronchitis versus pneumonia, because he was weak, she opted not to do a chest x-ray and decided to treat him with antibiotics and prednisone. The patient's appetite has been poor. He has had a bad cough, which is improved with Mucinex with codeine. The patient states that he has been having bad dreams or hallucinations on this medication and therefore did not take any last night. Last night he did eat his dinner, he had a biscuit and soup today. He has been drinking fluids. The patient is alcoholic but has not had any beer since he has been ill this week. He started re-drinking today. He also has been using marijuana within the last 2 days. Today he went back to the bar, started drinking a beer and had a transient loss of consciousness. He denies any chest pains, palpitations or shortness of breath. He was taken to the ER. The hospitalist was called because his troponin is slightly elevated. History provided by the patient as well as his daughter is present at bedside  Review of Systems:  The patient denies anorexia, fever, weight loss,, vision loss, decreased hearing, hoarseness, chest pain, syncope, dyspnea on exertion, peripheral edema, balance deficits, hemoptysis, abdominal pain, melena, hematochezia, severe indigestion/heartburn, hematuria, incontinence, genital sores, muscle weakness, suspicious skin lesions, transient blindness, difficulty walking, depression, unusual weight change, abnormal bleeding, enlarged lymph nodes, angioedema, and breast masses.  Past Medical History: Past Medical History  Diagnosis Date  . COPD (chronic obstructive pulmonary disease) (HCC)     emphysematous changes on past CT  . Hypertension   . PVD (peripheral vascular disease) (HCC) 04/27/2011    normal renal & abdominal aortography;  s/p Left Fem-Pop BPG 2004.  Marland Kitchen Dyslipidemia   . Coronary artery disease 04/27/2011    Low risk Myoview in 2010:Non-obstructive disase, except for small OMB 95% ostial lesion  . H/O ETOH abuse   . GERD (gastroesophageal reflux disease)   . Alcohol abuse   . Carotid artery disease (HCC)   . Pneumonia    Past Surgical History  Procedure Laterality Date  . Foot surgery    . Nasal sinus surgery    . Femoral-popliteal bypass graft Left 11/05/2002    Dr. Josephina Gip  . Transthoracic echocardiogram  08/14/2012    EF 55-60%, mod LVH, grade 1 diastolic dysfunction; vent septum thickness mod increased; calcified MV annulus & mild MR; RV systolic pressure increased - mild pulm HTN; PA peak pressure  . Nm myocar perf wall motion  01/2009    dipyridamole; normal pattern of perfusion in all regions; post-stress EF 75%; normal study, low risk   . Cardiac catheterization  04/27/2011    normal L main & LAD; L Cfx with minor irregularities, small marginal branch with 95% osital stenosis, RCA is nondominant & normal, LVEF 60% (Dr. Erlene Quan)  . Lower extremity arterial doppler  08/2012    R CFA & Profunda Femoral - diffuse irregular mixed density plaque; R SFA distal 70-99% diameter reduction; R PTA appears occluded; L CFA distal/Prox Anastomosis 70-99% diameter reduction; L Fem-Pop BPG open & patent; bilat TBBIs with inadequate perfusion for healing   . Renal doppler  05/2012    SMA - elevated velocities >60% diameter reduction; bilat renal arteries with 60-99% diameter reduction; R & L kidneys are normal  in size & symmetrical  . Carotid doppler  01/2010    R subclavian 50-69% diameter reduction, R prox ICA 50-69% diameter reduction, L prox ICA 50-69% diameter reduction  . Renal angiogram  06/25/2012    normal renal arteries  . Lower extremity angiogram  10/13/2010    patent left fem-pop bypass graft with moderate calcified prox disease in distal common femoral just prox to graft anastomosis, mod high-grade  segmental mid and distal R SFA disease, 2-3 vessel runoff with patent diffusely disease posterior tibilalis bilaterally (Dr. Erlene Quan)  . Colonoscopy    . Endarterectomy femoral Right 11/17/2013    Procedure: ENDARTERECTOMY FEMORAL;  Surgeon: Larina Earthly, MD;  Location: [facility] OR;  Service: Vascular;  Laterality: Right;  . Femoral-popliteal bypass graft Right 11/17/2013    Procedure: BYPASS GRAFT FEMORAL-POPLITEAL ARTERY;  Surgeon: Larina Earthly, MD;  Location: [facility] OR;  Service: Vascular;  Laterality: Right;  . Left heart catheterization with coronary angiogram N/A 04/27/2011    Procedure: LEFT HEART CATHETERIZATION WITH CORONARY ANGIOGRAM;  Surgeon: Runell Gess, MD;  Location: [facility] CATH LAB;  Service: Cardiovascular;  Laterality: N/A;  . Abdominal aortagram N/A 06/25/2012    Procedure: ABDOMINAL AORTAGRAM;  Surgeon: Runell Gess, MD;  Location: [facility] CATH LAB;  Service: Cardiovascular;  Laterality: N/A;  . Lower extremity angiogram N/A 08/04/2013    Procedure: LOWER EXTREMITY ANGIOGRAM;  Surgeon: Runell Gess, MD;  Location: [facility] CATH LAB;  Service: Cardiovascular;  Laterality: N/A;  . Carotid angiogram N/A 08/04/2013    Procedure: CAROTID ANGIOGRAM;  Surgeon: Runell Gess, MD;  Location: [facility] CATH LAB;  Service: Cardiovascular;  Laterality: N/A;  . Visceral angiogram N/A 11/19/2013    Procedure: VISCERAL ANGIOGRAM;  Surgeon: Fransisco Hertz, MD;  Location: [facility] CATH LAB;  Service: Cardiovascular;  Laterality: N/A;  . Percutaneous stent intervention  11/19/2013    Procedure: PERCUTANEOUS STENT INTERVENTION;  Surgeon: Fransisco Hertz, MD;  Location: [facility] CATH LAB;  Service: Cardiovascular;;  superior mesenteric artery    Medications: Prior to Admission medications   Medication Sig Start Date End Date Taking? Authorizing Provider  aspirin EC 81 MG tablet Take 81 mg by mouth at bedtime.    Historical Provider, MD  atorvastatin (LIPITOR) 10 MG tablet Take 1 tablet (10 mg total) by mouth daily. 10/31/14    Lorre Munroe, NP  chlorthalidone (HYGROTON) 25 MG tablet Take 1 tablet (25 mg total) by mouth daily. 10/31/14   Lorre Munroe, NP  Choline Fenofibrate 135 MG capsule Take 1 capsule (135 mg total) by mouth daily. 10/31/14   Lorre Munroe, NP  clopidogrel (PLAVIX) 75 MG tablet Take 1 tablet (75 mg total) by mouth daily. 10/31/14   Lorre Munroe, NP  co-enzyme Q-10 30 MG capsule Take 30 mg by mouth daily.    Historical Provider, MD  folic acid (FOLVITE) 1 MG tablet Take 1 tablet (1 mg total) by mouth daily. 03/05/14   Runell Gess, MD  hydrALAZINE (APRESOLINE) 50 MG tablet Take 1.5 tablets (75 mg total) by mouth 2 (two) times daily. 10/31/14   Lorre Munroe, NP  HYDROcodone-homatropine [facility]) 5-1.5 MG/5ML syrup Take 5 mLs by mouth every 8 (eight) hours as needed for cough. 04/15/15   Lorre Munroe, NP  levofloxacin (LEVAQUIN) 500 MG tablet Take 1 tablet (500 mg total) by mouth daily. 04/15/15   Lorre Munroe, NP  metoprolol succinate (TOPROL-XL) 25 MG 24 hr tablet TAKE 1 TABLET (25 MG  TOTAL) BY MOUTH DAILY. 10/31/14   Lorre Munroe, NP  pantoprazole (PROTONIX) 40 MG tablet Take 1 tablet (40 mg total) by mouth daily. 10/31/14   Lorre Munroe, NP  predniSONE (DELTASONE) 10 MG tablet Take 3 tabs on days 1-2, take 2 tabs on days 3-4, take 1 tab on days 5-6 04/15/15   Lorre Munroe, NP  tamsulosin (FLOMAX) 0.4 MG CAPS capsule Take 1 capsule (0.4 mg total) by mouth daily. 10/31/14   Lorre Munroe, NP  traZODone (DESYREL) 50 MG tablet Take 0.5-1 tablets (25-50 mg total) by mouth at bedtime as needed for sleep. 10/31/14   Lorre Munroe, NP  vitamin B-12 (CYANOCOBALAMIN) 100 MCG tablet Take 100 mcg by mouth daily.    Historical Provider, MD    Allergies:   Allergies  Allergen Reactions  . Contrast Media [Iodinated Diagnostic Agents] Itching  . Diovan [Valsartan] Swelling    angioedema  . Neosporin [Neomycin-Bacitracin Zn-Polymyx] Hives  . Neomycin Hives    Social History:  reports that he quit  smoking about 18 years ago. His smoking use included Cigarettes. He has a 50 pack-year smoking history. He quit smokeless tobacco use about 16 years ago. He reports that he drinks about 1.2 oz of alcohol per week. He reports that he does not use illicit drugs.  Family History: Family History  Problem Relation Age of Onset  . CAD Brother     also stroke  . Hypertension Brother   . Diabetes Brother   . Hypertension Father     also stroke  . Stroke Father   . Hypertension Brother      X 2  . Kidney cancer Mother   . Cancer Mother     Renal  . Cancer Sister     Lung  . COPD Sister   . Hypertension Sister     Physical Exam: Filed Vitals:   04/18/15 1845 04/18/15 1930 04/18/15 1945 04/18/15 2000  BP: 149/63 137/64 134/66 142/81  Pulse: 86 87 75 88  Temp:      TempSrc:      Resp: SpO2: 99% 98% 99% 96%    General:  Alert and oriented times three, disheveled appearing gentleman, no acute distress Eyes: PERRLA, pink conjunctiva, no scleral icterus ENT: Moist oral mucosa, neck supple, no thyromegaly Lungs: clear to ascultation, no wheeze, no crackles, no use of accessory muscles Cardiovascular: regular rate and rhythm, no regurgitation, no gallops, no murmurs. No carotid bruits, no JVD Abdomen: soft, positive BS, non-tender, non-distended, no organomegaly, not an acute abdomen GU: not examined Neuro: CN II - XII grossly intact, sensation intact Musculoskeletal: strength 5/5 all extremities, no clubbing, cyanosis or edema Skin: no rash, no subcutaneous crepitation, no decubitus Psych: appropriate patient   Labs on Admission:   Recent Labs  04/18/15 1840  NA 130*  K 3.0*  CL 93*  CO2 22  GLUCOSE 99  BUN 35*  CREATININE 1.73*  CALCIUM 8.3*    Recent Labs  04/18/15 1840  AST 33  ALT 24  ALKPHOS 24*  BILITOT 0.6  PROT 5.4*  ALBUMIN 3.1*   No results for input(s): LIPASE, AMYLASE in the last 72 hours.  Recent Labs  04/18/15 1840  WBC 4.5   NEUTROABS 3.3  HGB 11.2*  HCT 33.7*  MCV 88.0  PLT 169   No results for input(s): CKTOTAL, CKMB, CKMBINDEX, TROPONINI in the last 72 hours. Invalid input(s): POCBNP No results for input(s): DDIMER  in the last 72 hours. No results for input(s): HGBA1C in the last 72 hours. No results for input(s): CHOL, HDL, LDLCALC, TRIG, CHOLHDL, LDLDIRECT in the last 72 hours. No results for input(s): TSH, T4TOTAL, T3FREE, THYROIDAB in the last 72 hours.  Invalid input(s): FREET3 No results for input(s): VITAMINB12, FOLATE, FERRITIN, TIBC, IRON, RETICCTPCT in the last 72 hours.  Micro Results: No results found for this or any previous visit (from the past 240 hour(s)).  Results for JULIUN, BRANDEL (MRN [id number]) as of 04/18/2015 21:57  Ref. Range 04/18/2015 18:19  Amphetamines Latest Ref Range: NONE DETECTED  NONE DETECTED  Barbiturates Latest Ref Range: NONE DETECTED  NONE DETECTED  Benzodiazepines Latest Ref Range: NONE DETECTED  NONE DETECTED  Opiates Latest Ref Range: NONE DETECTED  POSITIVE (A)  COCAINE Latest Ref Range: NONE DETECTED  NONE DETECTED  Tetrahydrocannabinol Latest Ref Range: NONE DETECTED  POSITIVE (A)   Results for KAYCE, SHANGRAW (MRN [id number]) as of 04/18/2015 21:57  Ref. Range 04/18/2015 18:52  Troponin i, poc Latest Ref Range: 0.00-0.08 ng/mL 0.11 (HH)    EKG: LVH, NSR  Radiological Exams on Admission: Dg Chest 2 View  04/18/2015  CLINICAL DATA:  Acute onset of syncope.  Cough.  Initial encounter. EXAM: CHEST  2 VIEW COMPARISON:  Chest radiograph performed 06/17/2013, and CT of the chest performed 05/15/2013 FINDINGS: The lungs are mildly hyperexpanded, with flattening of the hemidiaphragms, compatible with COPD. Mild peribronchial thickening is noted. Scattered vascular calcifications are seen. Mild scarring is noted at the lung apices. The heart is normal in size. No acute osseous abnormalities are identified. Chronic left upper rib deformity is noted.  IMPRESSION: Findings of COPD. Mild peribronchial thickening noted. Mild scarring at the lung apices. Electronically Signed   By: Roanna Raider M.D.   On: 04/18/2015 19:23    Assessment/Plan Present on Admission:  . Acute kidney injury (HCC) -Bring in for 23 hour observation -IV fluid hydration -Repeat BMP in a.m. Marland Kitchen Syncope and collapse -Suspect multifactorial etiology. Patient has been ill, has been weak with poor appetite, on antibiotics and steroids. Patient is deconditioned, an alcoholic and at baseline infirm on a cane. Patient has some degree of chronic dizziness on standing and recent use of marijuana. -Monitor on telemetry  . Elevated troponin -Cycle cardiac enzymes. Patient denies any chest pains  -Elevated troponin may be due to patient's increased creatinine but we have no other troponin level for comparison. We'll cycle and evaluate for stability, increase or decrease -Aspirin 81mg  ordered and lipid panel ordered  . Hypokalemia . Hyponatremia -Replete in IV and by mouth  -Repeat BMP in a.m.  . Bronchitis/COPD -Complete antibiotic course as started by patient's NP, doing nebs as needed  . CAD- non obstructive CAD by cath 04/19/11 -Monitor  . Alcohol abuse -CIWA protocol ordered  . Marijuana use  -Aware  . HLD (hyperlipidemia) -Stable, home medications resumed  . HTN  -Stable, home medications resumed  . PVD, LFBPG 2004, dopplers show progression of disease bilat -Stable, home medications resumed  . BPH (benign prostatic hyperplasia) -Stable, home medications resumed to   Homer Miller 04/18/2015, 9:54 PM

## 2015-04-18 NOTE — ED Notes (Signed)
Family at bedside. 

## 2015-04-19 ENCOUNTER — Encounter (HOSPITAL_COMMUNITY): Payer: Self-pay | Admitting: *Deleted

## 2015-04-19 DIAGNOSIS — N179 Acute kidney failure, unspecified: Secondary | ICD-10-CM | POA: Diagnosis not present

## 2015-04-19 DIAGNOSIS — F1010 Alcohol abuse, uncomplicated: Secondary | ICD-10-CM | POA: Diagnosis not present

## 2015-04-19 DIAGNOSIS — R55 Syncope and collapse: Secondary | ICD-10-CM | POA: Diagnosis not present

## 2015-04-19 DIAGNOSIS — N189 Chronic kidney disease, unspecified: Secondary | ICD-10-CM | POA: Diagnosis not present

## 2015-04-19 LAB — BASIC METABOLIC PANEL
ANION GAP: 15 (ref 5–15)
BUN: 35 mg/dL — AB (ref 6–20)
CHLORIDE: 99 mmol/L — AB (ref 101–111)
CO2: 20 mmol/L — ABNORMAL LOW (ref 22–32)
Calcium: 8.5 mg/dL — ABNORMAL LOW (ref 8.9–10.3)
Creatinine, Ser: 1.47 mg/dL — ABNORMAL HIGH (ref 0.61–1.24)
GFR calc Af Amer: 51 mL/min — ABNORMAL LOW (ref >60)
GFR, EST NON AFRICAN AMERICAN: 44 mL/min — AB (ref >60)
Glucose, Bld: 103 mg/dL — ABNORMAL HIGH (ref 65–99)
POTASSIUM: 3.5 mmol/L (ref 3.5–5.1)
SODIUM: 134 mmol/L — AB (ref 135–145)

## 2015-04-19 LAB — TROPONIN I
TROPONIN I: 0.09 ng/mL — AB (ref <0.031)
TROPONIN I: 0.07 ng/mL — AB (ref <0.031)
Troponin I: 0.11 ng/mL — ABNORMAL HIGH (ref <0.031)

## 2015-04-19 LAB — LIPID PANEL
CHOL/HDL RATIO: 4.5 ratio
CHOLESTEROL: 177 mg/dL (ref 0–200)
HDL: 39 mg/dL — AB (ref >40)
LDL Cholesterol: 101 mg/dL — ABNORMAL HIGH (ref 0–99)
TRIGLYCERIDES: 183 mg/dL — AB (ref <150)
VLDL: 37 mg/dL (ref 0–40)

## 2015-04-19 LAB — CBC
HEMATOCRIT: 33.4 % — AB (ref 39.0–52.0)
HEMOGLOBIN: 11.5 g/dL — AB (ref 13.0–17.0)
MCH: 29.9 pg (ref 26.0–34.0)
MCHC: 34.4 g/dL (ref 30.0–36.0)
MCV: 86.8 fL (ref 78.0–100.0)
Platelets: 156 10*3/uL (ref 150–400)
RBC: 3.85 MIL/uL — ABNORMAL LOW (ref 4.22–5.81)
RDW: 13.3 % (ref 11.5–15.5)
WBC: 3.4 10*3/uL — AB (ref 4.0–10.5)

## 2015-04-19 LAB — TSH: TSH: 0.817 u[IU]/mL (ref 0.350–4.500)

## 2015-04-19 LAB — MAGNESIUM: MAGNESIUM: 3 mg/dL — AB (ref 1.7–2.4)

## 2015-04-19 MED ORDER — SODIUM CHLORIDE 0.9% FLUSH
3.0000 mL | Freq: Two times a day (BID) | INTRAVENOUS | Status: DC
  Administered 2015-04-20: 3 mL via INTRAVENOUS

## 2015-04-19 MED ORDER — SENNOSIDES-DOCUSATE SODIUM 8.6-50 MG PO TABS
1.0000 | ORAL_TABLET | Freq: Every evening | ORAL | Status: DC | PRN
  Filled 2015-04-19: qty 1

## 2015-04-19 MED ORDER — LORAZEPAM 1 MG PO TABS: 0.0000 mg | ORAL_TABLET | Freq: Two times a day (BID) | ORAL | Status: DC

## 2015-04-19 MED ORDER — VITAMIN B-1 100 MG PO TABS
100.0000 mg | ORAL_TABLET | Freq: Every day | ORAL | Status: DC
  Administered 2015-04-19 – 2015-04-21 (×3): 100 mg via ORAL
  Filled 2015-04-19 (×3): qty 1

## 2015-04-19 MED ORDER — POTASSIUM CHLORIDE IN NACL 40-0.9 MEQ/L-% IV SOLN
INTRAVENOUS | Status: DC
  Administered 2015-04-19 – 2015-04-21 (×4): 75 mL/h via INTRAVENOUS
  Filled 2015-04-19 (×9): qty 1000

## 2015-04-19 MED ORDER — ENSURE ENLIVE PO LIQD
237.0000 mL | Freq: Two times a day (BID) | ORAL | Status: DC
  Administered 2015-04-19: 237 mL via ORAL

## 2015-04-19 MED ORDER — TAMSULOSIN HCL 0.4 MG PO CAPS
0.4000 mg | ORAL_CAPSULE | Freq: Every day | ORAL | Status: DC
  Administered 2015-04-19 – 2015-04-21 (×3): 0.4 mg via ORAL
  Filled 2015-04-19 (×3): qty 1

## 2015-04-19 MED ORDER — HYDRALAZINE HCL 50 MG PO TABS
75.0000 mg | ORAL_TABLET | Freq: Two times a day (BID) | ORAL | Status: DC
  Administered 2015-04-19 – 2015-04-20 (×4): 75 mg via ORAL
  Filled 2015-04-19: qty 1
  Filled 2015-04-19: qty 3
  Filled 2015-04-19: qty 1
  Filled 2015-04-19: qty 3

## 2015-04-19 MED ORDER — SODIUM CHLORIDE 0.9 % IV SOLN
INTRAVENOUS | Status: DC
  Administered 2015-04-19 (×2): via INTRAVENOUS

## 2015-04-19 MED ORDER — LORAZEPAM 2 MG/ML IJ SOLN: 1.0000 mg | Freq: Four times a day (QID) | INTRAMUSCULAR | Status: DC | PRN

## 2015-04-19 MED ORDER — THIAMINE HCL 100 MG/ML IJ SOLN: 100.0000 mg | Freq: Every day | INTRAMUSCULAR | Status: DC

## 2015-04-19 MED ORDER — MAGNESIUM SULFATE 2 GM/50ML IV SOLN
2.0000 g | Freq: Once | INTRAVENOUS | Status: AC
  Administered 2015-04-19: 2 g via INTRAVENOUS
  Filled 2015-04-19: qty 50

## 2015-04-19 MED ORDER — LORAZEPAM 1 MG PO TABS
0.0000 mg | ORAL_TABLET | Freq: Four times a day (QID) | ORAL | Status: AC
Start: 2015-04-19 — End: 2015-04-21
  Administered 2015-04-19: 1 mg via ORAL
  Filled 2015-04-19: qty 1

## 2015-04-19 MED ORDER — ONDANSETRON HCL 4 MG/2ML IJ SOLN: 4.0000 mg | Freq: Four times a day (QID) | INTRAMUSCULAR | Status: DC | PRN

## 2015-04-19 MED ORDER — FOLIC ACID 1 MG PO TABS
1.0000 mg | ORAL_TABLET | Freq: Every day | ORAL | Status: DC
  Administered 2015-04-19 – 2015-04-21 (×3): 1 mg via ORAL
  Filled 2015-04-19 (×3): qty 1

## 2015-04-19 MED ORDER — ADULT MULTIVITAMIN W/MINERALS CH
1.0000 | ORAL_TABLET | Freq: Every day | ORAL | Status: DC
  Administered 2015-04-19 – 2015-04-21 (×3): 1 via ORAL
  Filled 2015-04-19 (×3): qty 1

## 2015-04-19 MED ORDER — HEPARIN SODIUM (PORCINE) 5000 UNIT/ML IJ SOLN
5000.0000 [IU] | Freq: Three times a day (TID) | INTRAMUSCULAR | Status: DC
  Administered 2015-04-19 – 2015-04-20 (×4): 5000 [IU] via SUBCUTANEOUS
  Filled 2015-04-19 (×5): qty 1

## 2015-04-19 MED ORDER — LORAZEPAM 1 MG PO TABS: 1.0000 mg | ORAL_TABLET | Freq: Four times a day (QID) | ORAL | Status: DC | PRN

## 2015-04-19 MED ORDER — ALUM & MAG HYDROXIDE-SIMETH 200-200-20 MG/5ML PO SUSP: 30.0000 mL | Freq: Four times a day (QID) | ORAL | Status: DC | PRN

## 2015-04-19 MED ORDER — IPRATROPIUM-ALBUTEROL 0.5-2.5 (3) MG/3ML IN SOLN: 3.0000 mL | RESPIRATORY_TRACT | Status: DC | PRN

## 2015-04-19 MED ORDER — ONDANSETRON HCL 4 MG PO TABS: 4.0000 mg | ORAL_TABLET | Freq: Four times a day (QID) | ORAL | Status: DC | PRN

## 2015-04-19 MED ORDER — METOPROLOL SUCCINATE ER 25 MG PO TB24
25.0000 mg | ORAL_TABLET | Freq: Every day | ORAL | Status: DC
  Administered 2015-04-19 – 2015-04-21 (×3): 25 mg via ORAL
  Filled 2015-04-19 (×3): qty 1

## 2015-04-19 MED ORDER — ATORVASTATIN CALCIUM 10 MG PO TABS
10.0000 mg | ORAL_TABLET | Freq: Every day | ORAL | Status: DC
  Administered 2015-04-19 – 2015-04-21 (×3): 10 mg via ORAL
  Filled 2015-04-19 (×3): qty 1

## 2015-04-19 MED ORDER — CLOPIDOGREL BISULFATE 75 MG PO TABS
75.0000 mg | ORAL_TABLET | Freq: Every day | ORAL | Status: DC
Start: 2015-04-19 — End: 2015-04-21
  Administered 2015-04-19 – 2015-04-21 (×3): 75 mg via ORAL
  Filled 2015-04-19 (×3): qty 1

## 2015-04-19 MED ORDER — PANTOPRAZOLE SODIUM 40 MG PO TBEC
40.0000 mg | DELAYED_RELEASE_TABLET | Freq: Every day | ORAL | Status: DC
  Administered 2015-04-19 – 2015-04-21 (×3): 40 mg via ORAL
  Filled 2015-04-19 (×3): qty 1

## 2015-04-19 NOTE — ED Notes (Signed)
Yellow armband and yellow socks applied  

## 2015-04-19 NOTE — Progress Notes (Signed)
NURSING PROGRESS NOTE  Gary [name] [id number] Admission Data: 04/19/2015 1:56 PM Attending Provider: Gery Pray, MD [id number], REGINA, NP Code Status: full  Gary [name] is a 80 y.o. male patient admitted from ED:  -No acute distress noted.  -No complaints of shortness of breath.  -No complaints of chest pain.   Cardiac Monitoring: Box # 28 in place. And CCMD was notified   Blood pressure 125/46, pulse 76, temperature 97.8 F (36.6 C), temperature source Oral, resp. rate 18, height 5\' 7"  (1.702 m), SpO2 96 %.   IV Fluids:  IV in place, occlusive dsg intact without redness, IV cath forearm left, condition patent and no redness With NS with 40 of potassium at 72ml/hr    Allergies:  Contrast media; Diovan; Neosporin; and Neomycin  Past Medical History:   has a past medical history of COPD (chronic obstructive pulmonary disease) (HCC); Hypertension; PVD (peripheral vascular disease) (HCC) (04/27/2011); Dyslipidemia; Coronary artery disease (04/27/2011); H/O ETOH abuse; GERD (gastroesophageal reflux disease); Alcohol abuse; Carotid artery disease (HCC); and Pneumonia.  Past Surgical History:   has past surgical history that includes Foot surgery; Nasal sinus surgery; Femoral-popliteal Bypass Graft (Left, 11/05/2002); transthoracic echocardiogram (08/14/2012); NM MYOCAR PERF WALL MOTION (01/2009); Cardiac catheterization (04/27/2011); Lower Extremity Arterial Doppler (08/2012); Renal Doppler (05/2012); Carotid Doppler (01/2010); Renal angiogram (06/25/2012); Lower extremity angiogram (10/13/2010); Colonoscopy; Endarterectomy femoral (Right, 11/17/2013); Femoral-popliteal Bypass Graft (Right, 11/17/2013); left heart catheterization with coronary angiogram (N/A, 04/27/2011); abdominal aortagram (N/A, 06/25/2012); lower extremity angiogram (N/A, 08/04/2013); carotid angiogram (N/A, 08/04/2013); visceral angiogram (N/A, 11/19/2013); and percutaneous stent intervention (11/19/2013).  Social History:    reports that he quit smoking about 18 years ago. His smoking use included Cigarettes. He has a 50 pack-year smoking history. He quit smokeless tobacco use about 16 years ago. He reports that he drinks about 1.2 oz of alcohol per week. He reports that he uses illicit drugs (Marijuana).  Skin: dry and flaky generalized worse to bilateral heels; bruises to bilateral elbows and right forearm.  Patient/Family orientated to room. Information packet given to patient/family. Admission inpatient armband information verified with patient/family to include name and date of birth and placed on patient arm. Side rails up x 2, fall assessment and education completed with patient/family. Patient/family able to verbalize understanding of risk associated with falls and verbalized understanding to call for assistance before getting out of bed. Call light within reach. Patient/family able to voice and demonstrate understanding of unit orientation instructions.    Will continue to evaluate and treat per MD orders.

## 2015-04-19 NOTE — Progress Notes (Addendum)
[facility] PROGRESS NOTE  Gary [name] TWS:[id number] DOB: 1935/04/14 DOA: 04/18/2015 PCP: Nicki Reaper, NP  Length of stay: 1   Assessment/Plan: Principal Problem:   Acute kidney injury (HCC) Active Problems:   PVD, LFBPG 2004, dopplers show progression of disease bliat   HTN    HLD (hyperlipidemia)   CAD- non obstructive CAD by cath 04/19/11   BPH (benign prostatic hyperplasia)   Syncope   Elevated troponin   Syncope and collapse   Hypokalemia   Bronchitis   Hyponatremia   Alcohol abuse   Marijuana use   COPD (chronic obstructive pulmonary disease) (HCC)   Acute-on-chronic kidney injury [facility])  Brief summary 80 year old male with a history of alcohol abuse, COPD, gastroesophageal reflux disease, hypertension, coronary artery disease, presents to the ER after passing out at a bar. Patient was brought in wire EMS. He had an unwitnessed syncopal episode. Recently diagnosed with pneumonia versus acute bronchitis with poor oral appetite. Patient has had poor oral intake. UDS positive for marijuana. Alcohol level 85  Assessment and plan Acute kidney injury (HCC) 1.73>1.47 -improving , Bring in for 23 hour observation -IV fluid hydration -Repeat BMP in a.m. Marland Kitchen Syncope and collapse, likely orthostatic  -Suspect multifactorial etiology. Patient has been ill, has been weak with poor appetite, on antibiotics and steroids. Patient is deconditioned, an alcoholic and uses cane at baseline  Patient has some degree of chronic dizziness on standing and recent use of marijuana. -Monitor on telemetry  . Elevated troponin Abnormal cardiac enzymes 2,  slightly elevated in the setting of acute kidney injury Patient denies any chest pains , echo to r/o wall motion abnl, had a low-risk stress rest in 2010 -Aspirin 81mg  ordered and lipid panel ordered  Consult cardiology if 2-D echo is abnormal Repeat 2-D echo today  . Hypokalemia-replete  . Hyponatremia-likely in the setting  of dehydration -Replete in IV and by mouth  -Repeat BMP in a.m .  Marland Kitchen Bronchitis/COPD -Complete antibiotic course as started by patient's NP, doing nebs as needed   . CAD- non obstructive CAD by cath 04/19/11 -Monitor , as above  Anemia likely multifactorial, check anemia panel  . Alcohol abuse -CIWA protocol ordered  . Marijuana use  -Aware  . HLD (hyperlipidemia) -Stable, home medications resumed  . HTN  -Stable, home medications resumed  . PVD, LFBPG 2004, dopplers show progression of disease bilat -Stable, home medications resumed  . BPH (benign prostatic hyperplasia) -Stable, home medications resumed to     DVT prophylaxsis heparin   Code Status:      Code Status Orders        Start     Ordered   04/19/15 0133  Full code   Continuous     04/19/15 0132     Family Communication: Discussed in detail with the patient, all imaging results, lab results explained to the patient   Disposition Plan:  Anticipate discharge tomorrow     Consultants:  None    Procedures:  None    Antibiotics: Anti-infectives    None         HPI/Subjective: Denies any chest pain or shortness of breath,  Objective: Filed Vitals:   04/19/15 1000 04/19/15 1025 04/19/15 1025 04/19/15 1030  BP: 166/76  166/76 158/84  Pulse: 84  81 91  Temp:  98.6 F (37 C)    TempSrc:  Oral    Resp: 17   22  SpO2: 97%   98%  No intake or output data in the 24 hours ending 04/19/15 1156  Exam:  General: Somewhat confused, Lungs: Clear to auscultation bilaterally without wheezes or crackles Cardiovascular: Regular rate and rhythm without murmur gallop or rub normal S1 and S2 Abdomen: Nontender, nondistended, soft, bowel sounds positive, no rebound, no ascites, no appreciable mass Extremities: No significant cyanosis, clubbing, or edema bilateral lower extremities     Data Review   Micro Results No results found for this or any previous visit (from the past 240  hour(s)).  Radiology Reports Dg Chest 2 View  04/18/2015  CLINICAL DATA:  Acute onset of syncope.  Cough.  Initial encounter. EXAM: CHEST  2 VIEW COMPARISON:  Chest radiograph performed 06/17/2013, and CT of the chest performed 05/15/2013 FINDINGS: The lungs are mildly hyperexpanded, with flattening of the hemidiaphragms, compatible with COPD. Mild peribronchial thickening is noted. Scattered vascular calcifications are seen. Mild scarring is noted at the lung apices. The heart is normal in size. No acute osseous abnormalities are identified. Chronic left upper rib deformity is noted. IMPRESSION: Findings of COPD. Mild peribronchial thickening noted. Mild scarring at the lung apices. Electronically Signed   By: Roanna Raider M.D.   On: 04/18/2015 19:23     CBC  Recent Labs Lab 04/18/15 1840 04/19/15 0401  WBC 4.5 3.4*  HGB 11.2* 11.5*  HCT 33.7* 33.4*  PLT 169 156  MCV 88.0 86.8  MCH 29.2 29.9  MCHC 33.2 34.4  RDW 13.2 13.3  LYMPHSABS 0.7  --   MONOABS 0.4  --   EOSABS 0.0  --   BASOSABS 0.0  --     Chemistries   Recent Labs Lab 04/18/15 1840 04/19/15 0401  NA 130* 134*  K 3.0* 3.5  CL 93* 99*  CO2 22 20*  GLUCOSE 99 103*  BUN 35* 35*  CREATININE 1.73* 1.47*  CALCIUM 8.3* 8.5*  AST 33  --   ALT 24  --   ALKPHOS 24*  --   BILITOT 0.6  --    ------------------------------------------------------------------------------------------------------------------ CrCl cannot be calculated (Unknown ideal weight.). ------------------------------------------------------------------------------------------------------------------ No results for input(s): HGBA1C in the last 72 hours. ------------------------------------------------------------------------------------------------------------------  Recent Labs  04/19/15 0401  CHOL 177  HDL 39*  LDLCALC 101*  TRIG 183*  CHOLHDL 4.5    ------------------------------------------------------------------------------------------------------------------ No results for input(s): TSH, T4TOTAL, T3FREE, THYROIDAB in the last 72 hours.  Invalid input(s): FREET3 ------------------------------------------------------------------------------------------------------------------ No results for input(s): VITAMINB12, FOLATE, FERRITIN, TIBC, IRON, RETICCTPCT in the last 72 hours.  Coagulation profile No results for input(s): INR, PROTIME in the last 168 hours.  No results for input(s): DDIMER in the last 72 hours.  Cardiac Enzymes  Recent Labs Lab 04/19/15 0401 04/19/15 0741  TROPONINI 0.11* 0.09*   ------------------------------------------------------------------------------------------------------------------ Invalid input(s): POCBNP   CBG: No results for input(s): GLUCAP in the last 168 hours.     Studies: Dg Chest 2 View  04/18/2015  CLINICAL DATA:  Acute onset of syncope.  Cough.  Initial encounter. EXAM: CHEST  2 VIEW COMPARISON:  Chest radiograph performed 06/17/2013, and CT of the chest performed 05/15/2013 FINDINGS: The lungs are mildly hyperexpanded, with flattening of the hemidiaphragms, compatible with COPD. Mild peribronchial thickening is noted. Scattered vascular calcifications are seen. Mild scarring is noted at the lung apices. The heart is normal in size. No acute osseous abnormalities are identified. Chronic left upper rib deformity is noted. IMPRESSION: Findings of COPD. Mild peribronchial thickening noted. Mild scarring at the lung apices. Electronically Signed   By: Leotis Shames  Chang M.D.   On: 04/18/2015 19:23      Lab Results  Component Value Date   HGBA1C 5.0 01/16/2014   Lab Results  Component Value Date   LDLCALC 101* 04/19/2015   CREATININE 1.47* 04/19/2015       Scheduled Meds: . atorvastatin  10 mg Oral Daily  . clopidogrel  75 mg Oral Daily  . folic acid  1 mg Oral Daily  .  heparin  5,000 Units Subcutaneous 3 times per day  . hydrALAZINE  75 mg Oral BID  . LORazepam  0-4 mg Oral Q6H   Followed by  . [START ON 04/21/2015] LORazepam  0-4 mg Oral Q12H  . metoprolol succinate  25 mg Oral Daily  . multivitamin with minerals  1 tablet Oral Daily  . pantoprazole  40 mg Oral Daily  . sodium chloride flush  3 mL Intravenous Q12H  . tamsulosin  0.4 mg Oral Daily  . thiamine  100 mg Oral Daily   Or  . thiamine  100 mg Intravenous Daily   Continuous Infusions: . sodium chloride 75 mL/hr at 04/19/15 0418    Principal Problem:   Acute kidney injury [facility]) Active Problems:   PVD, LFBPG 2004, dopplers show progression of disease bliat   HTN    HLD (hyperlipidemia)   CAD- non obstructive CAD by cath 04/19/11   BPH (benign prostatic hyperplasia)   Syncope   Elevated troponin   Syncope and collapse   Hypokalemia   Bronchitis   Hyponatremia   Alcohol abuse   Marijuana use   COPD (chronic obstructive pulmonary disease) (HCC)   Acute-on-chronic kidney injury (HCC)    Time spent: 45 minutes   [facility]  [facility] Pager [phone]. If 7PM-7AM, please contact night-coverage at [email or web address], password [facility] 04/19/2015, 11:56 AM  LOS: 1 day

## 2015-04-19 NOTE — ED Notes (Signed)
Attempted to call report

## 2015-04-19 NOTE — ED Notes (Signed)
Explained to patient he needed to use a urinal the next time he needs to void

## 2015-04-20 ENCOUNTER — Observation Stay (HOSPITAL_BASED_OUTPATIENT_CLINIC_OR_DEPARTMENT_OTHER): Payer: Medicare Other

## 2015-04-20 DIAGNOSIS — E871 Hypo-osmolality and hyponatremia: Secondary | ICD-10-CM | POA: Diagnosis not present

## 2015-04-20 DIAGNOSIS — N179 Acute kidney failure, unspecified: Secondary | ICD-10-CM | POA: Diagnosis not present

## 2015-04-20 DIAGNOSIS — R079 Chest pain, unspecified: Secondary | ICD-10-CM | POA: Diagnosis not present

## 2015-04-20 DIAGNOSIS — E876 Hypokalemia: Secondary | ICD-10-CM | POA: Diagnosis not present

## 2015-04-20 DIAGNOSIS — R55 Syncope and collapse: Secondary | ICD-10-CM | POA: Diagnosis not present

## 2015-04-20 DIAGNOSIS — F1010 Alcohol abuse, uncomplicated: Secondary | ICD-10-CM | POA: Diagnosis not present

## 2015-04-20 DIAGNOSIS — E440 Moderate protein-calorie malnutrition: Secondary | ICD-10-CM | POA: Insufficient documentation

## 2015-04-20 LAB — ECHOCARDIOGRAM COMPLETE
Height: 67 in
WEIGHTICAEL: 2285.73 [oz_av]

## 2015-04-20 LAB — COMPREHENSIVE METABOLIC PANEL
ALT: 16 U/L — AB (ref 17–63)
AST: 20 U/L (ref 15–41)
Albumin: 2.8 g/dL — ABNORMAL LOW (ref 3.5–5.0)
Alkaline Phosphatase: 23 U/L — ABNORMAL LOW (ref 38–126)
Anion gap: 11 (ref 5–15)
BILIRUBIN TOTAL: 0.5 mg/dL (ref 0.3–1.2)
BUN: 25 mg/dL — AB (ref 6–20)
CALCIUM: 8.5 mg/dL — AB (ref 8.9–10.3)
CHLORIDE: 102 mmol/L (ref 101–111)
CO2: 23 mmol/L (ref 22–32)
CREATININE: 1.02 mg/dL (ref 0.61–1.24)
Glucose, Bld: 100 mg/dL — ABNORMAL HIGH (ref 65–99)
Potassium: 3.5 mmol/L (ref 3.5–5.1)
Sodium: 136 mmol/L (ref 135–145)
TOTAL PROTEIN: 5 g/dL — AB (ref 6.5–8.1)

## 2015-04-20 LAB — CBC
HEMATOCRIT: 33.2 % — AB (ref 39.0–52.0)
Hemoglobin: 10.8 g/dL — ABNORMAL LOW (ref 13.0–17.0)
MCH: 28.6 pg (ref 26.0–34.0)
MCHC: 32.5 g/dL (ref 30.0–36.0)
MCV: 88.1 fL (ref 78.0–100.0)
PLATELETS: 165 10*3/uL (ref 150–400)
RBC: 3.77 MIL/uL — AB (ref 4.22–5.81)
RDW: 13.4 % (ref 11.5–15.5)
WBC: 3.2 10*3/uL — ABNORMAL LOW (ref 4.0–10.5)

## 2015-04-20 MED ORDER — BOOST / RESOURCE BREEZE PO LIQD
1.0000 | Freq: Three times a day (TID) | ORAL | Status: DC
  Administered 2015-04-20: 1 via ORAL

## 2015-04-20 NOTE — Progress Notes (Signed)
[facility] PROGRESS NOTE  Gary [name] AXE:[id number] DOB: [birth year] DOA: 04/18/2015 PCP: Nicki Reaper, NP  Length of stay: 2   Assessment/Plan: Principal Problem:   Acute kidney injury (HCC) Active Problems:   PVD, LFBPG 2004, dopplers show progression of disease bliat   HTN    HLD (hyperlipidemia)   CAD- non obstructive CAD by cath 04/19/11   BPH (benign prostatic hyperplasia)   Syncope   Elevated troponin   Syncope and collapse   Hypokalemia   Bronchitis   Hyponatremia   Alcohol abuse   Marijuana use   COPD (chronic obstructive pulmonary disease) (HCC)   Acute-on-chronic kidney injury [facility])  Brief summary 80 year old male with a history of alcohol abuse, COPD, gastroesophageal reflux disease, hypertension, coronary artery disease, presents to the ER after passing out at a bar. Patient was brought in wire EMS. He had an unwitnessed syncopal episode. Recently diagnosed with pneumonia versus acute bronchitis with poor oral appetite. Patient has had poor oral intake. UDS positive for marijuana. Alcohol level 85  Assessment and plan Acute kidney injury (HCC) 1.73>1.47>1 -improved. CONTINUE fluids for another 24 hours.   . Syncope and collapse, likely orthostatic  -Suspect multifactorial etiology. Patient has been ill, has been weak with poor appetite, on antibiotics and steroids. Patient is deconditioned, an alcoholic and uses cane at baseline  Patient has some degree of chronic dizziness on standing and recent use of marijuana. - positive orthostatics today , resume fluids for one more day, repeat orthostatics in am.   . Elevated troponin Abnormal cardiac enzymes 2,  slightly elevated in the setting of acute kidney injury Patient denies any chest pains , echo to r/o wall motion abnl, had a low-risk stress rest in 2010 -Aspirin 81mg  ordered and lipid panel ordered  Repeat 2-D echo today does not show any difference when compared to last echocardiogram.    . Hypokalemia-repleted  . Hyponatremia-likely in the setting of dehydration repleted .  Marland Kitchen Bronchitis/COPD -Complete antibiotic course as started by patient's NP, doing nebs as needed   . CAD- non obstructive CAD by cath 04/19/11 -Monitor , as above  Anemia likely multifactorial, check anemia panel  . Alcohol abuse -CIWA protocol ordered  . Marijuana use  -Aware  . HLD (hyperlipidemia) -Stable, home medications resumed  . HTN  -Stable, home medications resumed  . PVD, LFBPG 2004, dopplers show progression of disease bilat -Stable, home medications resumed   . BPH (benign prostatic hyperplasia) -Stable, home medications resumed      DVT prophylaxsis heparin   Code Status:      Code Status Orders        Start     Ordered   04/19/15 0133  Full code   Continuous     04/19/15 0132     Family Communication: Discussed in detail with the patient, all imaging results, lab results explained to the patient   Disposition Plan:  Anticipate discharge tomorrow     Consultants:  None    Procedures:  None    Antibiotics: Anti-infectives    None         HPI/Subjective: Denies any chest pain or shortness of breath,  Objective: Filed Vitals:   04/19/15 1731 04/19/15 2154 04/20/15 0032 04/20/15 0529  BP: 178/59 136/81 107/61 166/75  Pulse: 75 74 85 76  Temp: 97.2 F (36.2 C) 98.2 F (36.8 C) 98.1 F (36.7 C) 98.6 F (37 C)  TempSrc: Oral Oral Oral Oral  Resp: 20  Height:      SpO2: 100% 93% 96% 99%    Intake/Output Summary (Last 24 hours) at 04/20/15 1610 Last data filed at 04/20/15 0846  Gross per 24 hour  Intake 1449.25 ml  Output   1095 ml  Net 354.25 ml    Exam:  General: Somewhat confused, Lungs: Clear to auscultation bilaterally without wheezes or crackles Cardiovascular: Regular rate and rhythm without murmur gallop or rub normal S1 and S2 Abdomen: Nontender, nondistended, soft, bowel sounds positive, no rebound, no  ascites, no appreciable mass Extremities: No significant cyanosis, clubbing, or edema bilateral lower extremities     Data Review   Micro Results No results found for this or any previous visit (from the past 240 hour(s)).  Radiology Reports Dg Chest 2 View  04/18/2015  CLINICAL DATA:  Acute onset of syncope.  Cough.  Initial encounter. EXAM: CHEST  2 VIEW COMPARISON:  Chest radiograph performed 06/17/2013, and CT of the chest performed 05/15/2013 FINDINGS: The lungs are mildly hyperexpanded, with flattening of the hemidiaphragms, compatible with COPD. Mild peribronchial thickening is noted. Scattered vascular calcifications are seen. Mild scarring is noted at the lung apices. The heart is normal in size. No acute osseous abnormalities are identified. Chronic left upper rib deformity is noted. IMPRESSION: Findings of COPD. Mild peribronchial thickening noted. Mild scarring at the lung apices. Electronically Signed   By: Roanna Raider M.D.   On: 04/18/2015 19:23     CBC  Recent Labs Lab 04/18/15 1840 04/19/15 0401 04/20/15 0543  WBC 4.5 3.4* 3.2*  HGB 11.2* 11.5* 10.8*  HCT 33.7* 33.4* 33.2*  PLT 169 156 165  MCV 88.0 86.8 88.1  MCH 29.2 29.9 28.6  MCHC 33.2 34.4 32.5  RDW 13.2 13.3 13.4  LYMPHSABS 0.7  --   --   MONOABS 0.4  --   --   EOSABS 0.0  --   --   BASOSABS 0.0  --   --     Chemistries   Recent Labs Lab 04/18/15 1840 04/19/15 0401 04/19/15 1334 04/20/15 0543  NA 130* 134*  --  136  K 3.0* 3.5  --  3.5  CL 93* 99*  --  102  CO2 22 20*  --  23  GLUCOSE 99 103*  --  100*  BUN 35* 35*  --  25*  CREATININE 1.73* 1.47*  --  1.02  CALCIUM 8.3* 8.5*  --  8.5*  MG  --   --  3.0*  --   AST 33  --   --  20  ALT 24  --   --  16*  ALKPHOS 24*  --   --  23*  BILITOT 0.6  --   --  0.5   ------------------------------------------------------------------------------------------------------------------ estimated creatinine clearance is 50.6 mL/min (by C-G formula  based on Cr of 1.02). ------------------------------------------------------------------------------------------------------------------ No results for input(s): HGBA1C in the last 72 hours. ------------------------------------------------------------------------------------------------------------------  Recent Labs  04/19/15 0401  CHOL 177  HDL 39*  LDLCALC 101*  TRIG 183*  CHOLHDL 4.5   ------------------------------------------------------------------------------------------------------------------  Recent Labs  04/19/15 1334  TSH 0.817   ------------------------------------------------------------------------------------------------------------------ No results for input(s): VITAMINB12, FOLATE, FERRITIN, TIBC, IRON, RETICCTPCT in the last 72 hours.  Coagulation profile No results for input(s): INR, PROTIME in the last 168 hours.  No results for input(s): DDIMER in the last 72 hours.  Cardiac Enzymes  Recent Labs Lab 04/19/15 0401 04/19/15 0741 04/19/15 1334  TROPONINI 0.11* 0.09* 0.07*   ------------------------------------------------------------------------------------------------------------------  Invalid input(s): POCBNP   CBG: No results for input(s): GLUCAP in the last 168 hours.     Studies: Dg Chest 2 View  04/18/2015  CLINICAL DATA:  Acute onset of syncope.  Cough.  Initial encounter. EXAM: CHEST  2 VIEW COMPARISON:  Chest radiograph performed 06/17/2013, and CT of the chest performed 05/15/2013 FINDINGS: The lungs are mildly hyperexpanded, with flattening of the hemidiaphragms, compatible with COPD. Mild peribronchial thickening is noted. Scattered vascular calcifications are seen. Mild scarring is noted at the lung apices. The heart is normal in size. No acute osseous abnormalities are identified. Chronic left upper rib deformity is noted. IMPRESSION: Findings of COPD. Mild peribronchial thickening noted. Mild scarring at the lung apices.  Electronically Signed   By: Roanna Raider M.D.   On: 04/18/2015 19:23      Lab Results  Component Value Date   HGBA1C 5.0 01/16/2014   Lab Results  Component Value Date   LDLCALC 101* 04/19/2015   CREATININE 1.02 04/20/2015       Scheduled Meds: . atorvastatin  10 mg Oral Daily  . clopidogrel  75 mg Oral Daily  . feeding supplement (ENSURE ENLIVE)  237 mL Oral BID BM  . folic acid  1 mg Oral Daily  . heparin  5,000 Units Subcutaneous 3 times per day  . hydrALAZINE  75 mg Oral BID  . LORazepam  0-4 mg Oral Q6H   Followed by  . [START ON 04/21/2015] LORazepam  0-4 mg Oral Q12H  . metoprolol succinate  25 mg Oral Daily  . multivitamin with minerals  1 tablet Oral Daily  . pantoprazole  40 mg Oral Daily  . sodium chloride flush  3 mL Intravenous Q12H  . tamsulosin  0.4 mg Oral Daily  . thiamine  100 mg Oral Daily   Continuous Infusions: . 0.9 % NaCl with KCl 40 mEq / L 75 mL/hr (04/20/15 0412)    Principal Problem:   Acute kidney injury (HCC) Active Problems:   PVD, LFBPG 2004, dopplers show progression of disease bliat   HTN    HLD (hyperlipidemia)   CAD- non obstructive CAD by cath 04/19/11   BPH (benign prostatic hyperplasia)   Syncope   Elevated troponin   Syncope and collapse   Hypokalemia   Bronchitis   Hyponatremia   Alcohol abuse   Marijuana use   COPD (chronic obstructive pulmonary disease) (HCC)   Acute-on-chronic kidney injury (HCC)    Time spent: 35 minutes   Chritopher Coster  [facility] Pager [phone]. If 7PM-7AM, please contact night-coverage at [email or web address], password [facility] 04/20/2015, 9:24 AM  LOS: 2 days

## 2015-04-20 NOTE — Care Management Note (Signed)
Case Management Note  Patient Details  Name: KYAN REPKE MRN: [id number] Date of Birth: 25-Dec-1935  Subjective/Objective:             Presents with syncope/AKI. Resides with son.    Action/Plan: Return to home when medically stable. CM to f/u with disposition needs.  Expected Discharge Date:                  Expected Discharge Plan:  Home w Home Health Services  In-House Referral:     Discharge planning Services  CM Consult  Post Acute Care Choice:    Choice offered to:  Patient  DME Arranged:    DME Agency:  [facility]  HH Arranged:  PT [facility] Agency:     Status of Service:  Completed, signed off  Medicare Important Message Given:    Date Medicare IM Given:    Medicare IM give by:    Date Additional Medicare IM Given:    Additional Medicare Important Message give by:     If discussed at Long Length of Stay Meetings, dates discussed:    Additional Comments: CM spoke with pt regarding d/c planning . Pt states will care for self @ d/c with help from son if needed. CM to f/u with PT for evaluation, pt states recommendation is for HHPT. Pt stated has used [facility] services in the past and would like to used [facility] for HHPT @ d/c. CM made referral tentative to MD's order for hhpt with [facility]/Donna @ [phone].  Gae Gallop [place], [place] [phone] 04/20/2015, 12:17 PM

## 2015-04-20 NOTE — Progress Notes (Signed)
Initial Nutrition Assessment  DOCUMENTATION CODES:   Non-severe (moderate) malnutrition in context of social or environmental circumstances  INTERVENTION:   -D/c Ensure Enlive po BID, each supplement provides 350 kcal and 20 grams of protein -Boost Breeze po TID, each supplement provides 250 kcal and 9 grams of protein  NUTRITION DIAGNOSIS:   Malnutrition related to social / environmental circumstances as evidenced by mild depletion of body fat, mild depletion of muscle mass.  GOAL:   Patient will meet greater than or equal to 90% of their needs  MONITOR:   PO intake, Supplement acceptance, Labs, Weight trends, Skin, I & O's  REASON FOR ASSESSMENT:   Malnutrition Screening Tool    ASSESSMENT:   This is a 80 year old gentleman who was been feeling poorly all week. On Thursday he saw his PCP/NP who diagnosed him with bronchitis versus pneumonia, because he was weak, she opted not to do a chest x-ray and decided to treat him with antibiotics and prednisone. The patient's appetite has been poor. He has had a bad cough, which is improved with Mucinex with codeine.   Pt admitted with AKI. Pt with ETOH abuse.   Spoke with pt at bedside, who had a very flat affect. He reports poor appetite over the past 5 days, due to not feeling well after being diagnosed with bronchitis/pneumonia. Prior to this, pt reveals his appetite was steadily getting better. He shares with this RD that he experienced significant wt loss (approximately 30#) approximately 2 years and ago and he has steadily tried to regain weight back with better PO intake. Wt hx reveals steady wt progression over the past year. Pt reveals UBW around 150#.   Pt reports he consumed about 60% of his breakfast. Pt consumed about 75% of his lunch tray (he only ate a few bites of chicken and dumplings).   Nutrition-Focused physical exam completed. Findings are mild to moderate fat depletion, mild to moderate muscle depletion, and no  edema.   Pt has been refusing ordered Ensure supplement; pt reports he does not like Ensure, however, agreeable to try Boost Breeze with RD encouragement. Discussed importance of good meal and supplement intake to promote healing.   Labs reviewed.    Diet Order:  Diet Heart Room service appropriate?: Yes; Fluid consistency:: Thin  Skin:  Reviewed, no issues  Last BM:  04/17/15  Height:   Ht Readings from Last 1 Encounters:  04/19/15 5\' 7"  (1.702 m)    Weight:   Wt Readings from Last 1 Encounters:  04/20/15 142 lb 13.7 oz (64.8 kg)    Ideal Body Weight:  67.3 kg  BMI:  Body mass index is 22.37 kg/(m^2).  Estimated Nutritional Needs:   Kcal:  1750-1950  Protein:  85-100 grams  Fluid:  1.7-1.9 L  EDUCATION NEEDS:   Education needs addressed  Ramin Zoll A. Mayford Knife, RD, LDN, CDE Pager: [phone] After hours Pager: [phone]

## 2015-04-20 NOTE — Evaluation (Signed)
Physical Therapy Evaluation Patient Details Name: Gary [name] MRN: [id number] DOB: [birth year] Today's Date: 04/20/2015   History of Present Illness  80 year old gentleman who was been feeling poorly all week. On Thursday he saw his PCP/NP who diagnosed him with bronchitis versus pneumonia, because he was weak, she opted not to do a chest x-ray and decided to treat him with antibiotics and prednisone. Patient is a 80 y.o. Pt presented with intoxication and brought in for syncope. PMH includes alcohol abuse, COPD, GERD, HTN, PVD, PNA, carotid artery disease, foot surgery, right femoral popliteal bypass graft, percutaneous stent intervention.  Clinical Impression  Pt admitted with above diagnosis and presents to PT with functional limitations due to deficits listed below (See PT problem list). Pt needs skilled PT to maximize independence and safety to allow discharge to home alone. Will follow acutely but doubt will need PT after DC.     Follow Up Recommendations No PT follow up    Equipment Recommendations  None recommended by PT    Recommendations for Other Services       Precautions / Restrictions Precautions Precautions: Fall Restrictions Weight Bearing Restrictions: No      Mobility  Bed Mobility Overal bed mobility: Modified Independent Bed Mobility: Supine to Sit;Sit to Supine     Supine to sit: Modified independent (Device/Increase time) Sit to supine: Modified independent (Device/Increase time)      Transfers Overall transfer level: Needs assistance Equipment used: None Transfers: Sit to/from Stand Sit to Stand: Min guard            Ambulation/Gait Ambulation/Gait assistance: Min guard;Supervision Ambulation Distance (Feet): 170 Feet Assistive device: None (Used wall rail at times) Gait Pattern/deviations: Step-through pattern;Decreased stride length Gait velocity: decr Gait velocity interpretation: <1.8 ft/sec, indicative of risk for recurrent  falls General Gait Details: slightly unsteady but no overt loss of balance  Stairs            Wheelchair Mobility    Modified Rankin (Stroke Patients Only)       Balance Overall balance assessment: Needs assistance Sitting-balance support: No upper extremity supported;Feet supported Sitting balance-Leahy Scale: Normal     Standing balance support: No upper extremity supported;During functional activity Standing balance-Leahy Scale: Good                               Pertinent Vitals/Pain Pain Assessment: No/denies pain Pain Score: 3  Pain Location: bilateral heels Pain Intervention(s): Monitored during session;Repositioned    Home Living Family/patient expects to be discharged to:: Private residence Living Arrangements: Children (son) Available Help at Discharge: Family;Available PRN/intermittently Type of Home: House Home Access: Stairs to enter Entrance Stairs-Rails:  (rail on right with 1 step; rail on both with 5 steps) Entrance Stairs-Number of Steps: 1 or 5 Home Layout: One level Home Equipment: Walker - 2 wheels (has a seat for the shower)      Prior Function Level of Independence: Independent with assistive device(s)         Comments: assist with yardwork-reports daughter hired     Higher education careers adviser        Extremity/Trunk Assessment   Upper Extremity Assessment: Defer to OT evaluation           Lower Extremity Assessment: Generalized weakness         Communication   Communication: HOH  Cognition Arousal/Alertness: Awake/alert Behavior During Therapy: WFL for tasks assessed/performed Overall Cognitive Status: Within Functional Limits for  tasks assessed                      General Comments      Exercises        Assessment/Plan    PT Assessment Patient needs continued PT services  PT Diagnosis Generalized weakness;Difficulty walking   PT Problem List Decreased strength;Decreased balance;Decreased  mobility  PT Treatment Interventions Gait training;DME instruction;Functional mobility training;Therapeutic activities;Therapeutic exercise;Balance training;Patient/family education   PT Goals (Current goals can be found in the Care Plan section) Acute Rehab PT Goals Patient Stated Goal: not stated PT Goal Formulation: With patient Time For Goal Achievement: 04/27/15 Potential to Achieve Goals: Good    Frequency Min 3X/week   Barriers to discharge Decreased caregiver support Lives alone    Co-evaluation               End of Session Equipment Utilized During Treatment: Gait belt Activity Tolerance: Patient tolerated treatment well Patient left: in bed;with call bell/phone within reach (vascular present: informed bed alarm on) Nurse Communication: Mobility status         Time: 1420-1437 PT Time Calculation (min) (ACUTE ONLY): 17 min   Charges:   PT Evaluation $PT Eval Moderate Complexity: 1 Procedure     PT G Codes:        Gary [name] May 07, 2015, 3:46 PM Gary [name] PT [phone]

## 2015-04-20 NOTE — Progress Notes (Signed)
*  PRELIMINARY RESULTS* Echocardiogram 2D Echocardiogram has been performed.  Jeryl Columbia 04/20/2015, 3:17 PM

## 2015-04-20 NOTE — Evaluation (Signed)
Occupational Therapy Evaluation Patient Details Name: Gary [name] MRN: [id number] DOB: August 10, 1935 Today's Date: 04/20/2015    History of Present Illness 80 year old gentleman who was been feeling poorly all week. On Thursday he saw his PCP/NP who diagnosed him with bronchitis versus pneumonia, because he was weak, she opted not to do a chest x-ray and decided to treat him with antibiotics and prednisone. Patient is a 80 y.o. Pt presented with intoxication and brought in for syncope. PMH includes alcohol abuse, COPD, GERD, HTN, PVD, PNA, carotid artery disease, foot surgery, right femoral popliteal bypass graft, percutaneous stent intervention.   Clinical Impression   Pt admitted with above. Pt independent with ADLs, PTA. Feel pt will benefit from acute OT to increase independence and strength prior to d/c.    Follow Up Recommendations  Home health OT;Supervision - Intermittent    Equipment Recommendations  None recommended by OT    Recommendations for Other Services       Precautions / Restrictions Precautions Precautions: Fall Restrictions Weight Bearing Restrictions: No      Mobility Bed Mobility Overal bed mobility: Needs Assistance Bed Mobility: Supine to Sit     Supine to sit: Supervision        Transfers Overall transfer level: Needs assistance   Transfers: Sit to/from Stand Sit to Stand: Min guard              Balance      Unsteady with ambulation-Min guard given. LOB with single leg stance.                                      ADL Overall ADL's : Needs assistance/impaired                 Upper Body Dressing : Set up;Sitting   Lower Body Dressing: Sit to/from stand;Min guard   Toilet Transfer: Min guard;Ambulation (sit to stand from bed)             General ADL Comments: Educated on safety such as sitting for LB ADLs.      Vision     Perception     Praxis      Pertinent Vitals/Pain Pain Assessment:  0-10 Pain Score: 3  Pain Location: bilateral heels Pain Intervention(s): Monitored during session;Repositioned     Hand Dominance     Extremity/Trunk Assessment Upper Extremity Assessment Upper Extremity Assessment: Generalized weakness (decreased ROM with left shoulder flexion)   Lower Extremity Assessment Lower Extremity Assessment: Defer to PT evaluation       Communication Communication Communication: HOH   Cognition Arousal/Alertness: Awake/alert Behavior During Therapy: WFL for tasks assessed/performed Overall Cognitive Status: Within Functional Limits for tasks assessed                     General Comments       Exercises       Shoulder Instructions      Home Living Family/patient expects to be discharged to:: Private residence Living Arrangements: Children (son) Available Help at Discharge: Family;Available PRN/intermittently Type of Home: House Home Access: Stairs to enter [employer] of Steps: 1 or 5 Entrance Stairs-Rails:  (rail on right with 1 step; rail on both with 5 steps) Home Layout: One level     Bathroom Shower/Tub: Walk-in shower         Home Equipment: Environmental consultant - 2 wheels (has a seat for the shower)  Prior Functioning/Environment Level of Independence: Independent with assistive device(s)        Comments: assist with yardwork-reports daughter hired    OT Diagnosis: Generalized weakness   OT Problem List: Decreased strength;Decreased range of motion;Pain;Decreased knowledge of use of DME or AE;Impaired balance (sitting and/or standing)   OT Treatment/Interventions: Self-care/ADL training;Therapeutic exercise;DME and/or AE instruction;Therapeutic activities;Patient/family education;Balance training    OT Goals(Current goals can be found in the care plan section) Acute Rehab OT Goals Patient Stated Goal: not stated OT Goal Formulation: With patient Time For Goal Achievement: 04/27/15 Potential to  Achieve Goals: Good ADL Goals Pt Will Perform Lower Body Dressing:  (including gathering items) Pt Will Transfer to Toilet: ambulating;regular height toilet;with modified independence (with cane or RW) Pt Will Perform Toileting - Clothing Manipulation and hygiene: with modified independence;sit to/from stand Additional ADL Goal #1: Pt will independently perform HEP for bilateral UEs to increase strength.  OT Frequency: Min 2X/week   Barriers to D/C:            Co-evaluation              End of Session Equipment Utilized During Treatment: Gait belt Nurse Communication: Mobility status  Activity Tolerance: Patient tolerated treatment well Patient left: in chair;with chair alarm set;with call bell/phone within reach   Time: 1113-1130 OT Time Calculation (min): 17 min Charges:  OT General Charges $OT Visit: 1 Procedure OT Evaluation $OT Eval Moderate Complexity: 1 Procedure G-Codes: OT G-codes **NOT FOR INPATIENT CLASS** Functional Assessment Tool Used: clinical judgment Functional Limitation: Self care Self Care Current Status (Y0459): At least 1 percent but less than 20 percent impaired, limited or restricted Self Care Goal Status (X7741): At least 1 percent but less than 20 percent impaired, limited or restricted  Earlie Raveling OTR/L [phone] 04/20/2015, 12:57 PM

## 2015-04-21 DIAGNOSIS — N179 Acute kidney failure, unspecified: Principal | ICD-10-CM

## 2015-04-21 DIAGNOSIS — N189 Chronic kidney disease, unspecified: Secondary | ICD-10-CM

## 2015-04-21 DIAGNOSIS — N400 Enlarged prostate without lower urinary tract symptoms: Secondary | ICD-10-CM | POA: Diagnosis not present

## 2015-04-21 DIAGNOSIS — F1010 Alcohol abuse, uncomplicated: Secondary | ICD-10-CM | POA: Diagnosis not present

## 2015-04-21 DIAGNOSIS — I10 Essential (primary) hypertension: Secondary | ICD-10-CM

## 2015-04-21 DIAGNOSIS — R55 Syncope and collapse: Secondary | ICD-10-CM | POA: Diagnosis not present

## 2015-04-21 LAB — IRON AND TIBC
IRON: 102 ug/dL (ref 45–182)
Saturation Ratios: 40 % — ABNORMAL HIGH (ref 17.9–39.5)
TIBC: 252 ug/dL (ref 250–450)
UIBC: 150 ug/dL

## 2015-04-21 LAB — BASIC METABOLIC PANEL
Anion gap: 8 (ref 5–15)
BUN: 16 mg/dL (ref 6–20)
CHLORIDE: 107 mmol/L (ref 101–111)
CO2: 22 mmol/L (ref 22–32)
Calcium: 8.4 mg/dL — ABNORMAL LOW (ref 8.9–10.3)
Creatinine, Ser: 0.9 mg/dL (ref 0.61–1.24)
GFR calc Af Amer: >60 mL/min (ref >60)
GFR calc non Af Amer: >60 mL/min (ref >60)
Glucose, Bld: 99 mg/dL (ref 65–99)
POTASSIUM: 4 mmol/L (ref 3.5–5.1)
SODIUM: 137 mmol/L (ref 135–145)

## 2015-04-21 LAB — RETICULOCYTES
RBC.: 3.6 MIL/uL — ABNORMAL LOW (ref 4.22–5.81)
Retic Count, Absolute: 25.2 10*3/uL (ref 19.0–186.0)
Retic Ct Pct: 0.7 % (ref 0.4–3.1)

## 2015-04-21 LAB — VITAMIN B12: Vitamin B-12: 857 pg/mL (ref 180–914)

## 2015-04-21 LAB — FOLATE: Folate: 19.5 ng/mL (ref >5.9)

## 2015-04-21 LAB — FERRITIN: FERRITIN: 233 ng/mL (ref 24–336)

## 2015-04-21 MED ORDER — AMLODIPINE BESYLATE 5 MG PO TABS
5.0000 mg | ORAL_TABLET | Freq: Every day | ORAL | Status: DC
  Administered 2015-04-21: 5 mg via ORAL
  Filled 2015-04-21: qty 1

## 2015-04-21 MED ORDER — AMLODIPINE BESYLATE 5 MG PO TABS: 5.0000 mg | ORAL_TABLET | Freq: Every day | ORAL | Status: DC

## 2015-04-21 NOTE — Discharge Summary (Signed)
Physician Discharge Summary  Gary [name] ZOX:[id number] DOB: [birth year] DOA: 04/18/2015  PCP: Nicki Reaper, NP  Admit date: 04/18/2015 Discharge date: 04/21/2015  Time spent: 20 minutes  Recommendations for Outpatient Follow-up:  1. Follow up with PCP in 2-3 weeks   Discharge Diagnoses:  Principal Problem:   Acute kidney injury [facility]) Active Problems:   PVD, LFBPG 2004, dopplers show progression of disease bliat   HTN    HLD (hyperlipidemia)   CAD- non obstructive CAD by cath 04/19/11   BPH (benign prostatic hyperplasia)   Syncope   Elevated troponin   Syncope and collapse   Hypokalemia   Bronchitis   Hyponatremia   Alcohol abuse   Marijuana use   COPD (chronic obstructive pulmonary disease) (HCC)   Acute-on-chronic kidney injury (HCC)   Malnutrition of moderate degree   Discharge Condition: Improved  Diet recommendation: Heart healthy  Filed Weights   04/20/15 1100  Weight: 64.8 kg (142 lb 13.7 oz)    History of present illness:  Please review dictated H and P from 3/12 for details. Briefly, 80 year old male with a history of alcohol abuse, COPD, gastroesophageal reflux disease, hypertension, coronary artery disease, presents to the ER after passing out at a bar. Patient was brought in wire EMS. He had an unwitnessed syncopal episode. Recently diagnosed with pneumonia versus acute bronchitis with poor oral appetite. Patient has had poor oral intake. UDS positive for marijuana. Alcohol level 85  Hospital Course:  Acute kidney injury (HCC) 1.73>1.47>1 -improved and resolved with IVF hydration.   . Syncope and collapse, likely orthostatic  -Suspect multifactorial etiology. Patient has been ill, has been weak with poor appetite, on antibiotics and steroids. Patient is deconditioned, an alcoholic and uses cane at baseline Patient has some degree of chronic dizziness on standing and recent use of marijuana. - positive orthostatics this admission. Negative orthostatics  by day of discharge. Recommend holding diuretics on discharge, instead prescribed norvasc for BP control   . Elevated troponin Abnormal cardiac enzymes 2, slightly elevated in the setting of acute kidney injury Patient denies any chest pains , echo to r/o wall motion abnl, had a low-risk stress rest in 2010 -Aspirin 81mg  ordered and lipid panel ordered  Repeat 2-D echo does not show any difference when compared to last echocardiogram.   . Hypokalemia-repleted  . Hyponatremia-likely in the setting of dehydration repleted .  Marland Kitchen Bronchitis/COPD -Complete antibiotic course as started by patient's NP, doing nebs as needed   . CAD- non obstructive CAD by cath 04/19/11 -Monitor , as above  Anemia likely multifactorial  . Alcohol abuse -CIWA protocol ordered   . Marijuana use  -Aware, cessation was done  . HLD (hyperlipidemia) -Stable, home medications resumed   . HTN  -Poorly controlled -Stopped chlorthalidone secondary to presenting dehydration. Instead, prescribed norvasc on discharge  . PVD, LFBPG 2004, dopplers show progression of disease bilat -Stable, home medications resumed   . BPH (benign prostatic hyperplasia) -Stable, home medications resumed   DVT prophylaxsis heparin while admitted  Discharge Exam: Filed Vitals:   04/21/15 0631 04/21/15 1044 04/21/15 1046 04/21/15 1048  BP: 186/60 172/52 155/70 164/68  Pulse: 65 64 113 73  Temp: 98.4 F (36.9 C) 98.6 F (37 C)    TempSrc:      Resp: 18 19 19    Height:      Weight:      SpO2: 97% 100% 99% 100%    General: Awake in nad Cardiovascular: regular, s1, s2 Respiratory: normal resp effort,  no wheezing  Discharge Instructions     Medication List    STOP taking these medications        chlorthalidone 25 MG tablet  Commonly known as:  HYGROTON      TAKE these medications        amLODipine 5 MG tablet  Commonly known as:  NORVASC  Take 1 tablet (5 mg total) by mouth daily.     aspirin EC  81 MG tablet  Take 81 mg by mouth at bedtime.     atorvastatin 10 MG tablet  Commonly known as:  LIPITOR  Take 1 tablet (10 mg total) by mouth daily.     Choline Fenofibrate 135 MG capsule  Take 1 capsule (135 mg total) by mouth daily.     clopidogrel 75 MG tablet  Commonly known as:  PLAVIX  Take 1 tablet (75 mg total) by mouth daily.     co-enzyme Q-10 30 MG capsule  Take 30 mg by mouth daily.     folic acid 1 MG tablet  Commonly known as:  FOLVITE  Take 1 tablet (1 mg total) by mouth daily.     hydrALAZINE 50 MG tablet  Commonly known as:  APRESOLINE  Take 1.5 tablets (75 mg total) by mouth 2 (two) times daily.     HYDROcodone-homatropine 5-1.5 MG/5ML syrup  Commonly known as:  HYCODAN  Take 5 mLs by mouth every 8 (eight) hours as needed for cough.     levofloxacin 500 MG tablet  Commonly known as:  LEVAQUIN  Take 1 tablet (500 mg total) by mouth daily.     metoprolol succinate 25 MG 24 hr tablet  Commonly known as:  TOPROL-XL  TAKE 1 TABLET (25 MG TOTAL) BY MOUTH DAILY.     pantoprazole 40 MG tablet  Commonly known as:  PROTONIX  Take 1 tablet (40 mg total) by mouth daily.     predniSONE 10 MG tablet  Commonly known as:  DELTASONE  Take 3 tabs on days 1-2, take 2 tabs on days 3-4, take 1 tab on days 5-6     tamsulosin 0.4 MG Caps capsule  Commonly known as:  FLOMAX  Take 1 capsule (0.4 mg total) by mouth daily.     vitamin B-12 100 MCG tablet  Commonly known as:  CYANOCOBALAMIN  Take 100 mcg by mouth daily.       Allergies  Allergen Reactions  . Contrast Media [Iodinated Diagnostic Agents] Itching  . Diovan [Valsartan] Swelling    angioedema  . Neosporin [Neomycin-Bacitracin Zn-Polymyx] Hives  . Neomycin Hives   Follow-up Information    Follow up with [facility].   Why:  Home Health PT arranged   Contact information:   [address] [place] [address] [phone]       Follow up with Nicki Reaper, NP. Schedule  an appointment as soon as possible for a visit on 04/28/2015.   Specialty:  Internal Medicine   Why:  Hospital follow up /// Appointment with Dr. Sampson Si is on 04/28/15 at [facility] information:   [address] [place] [address] [phone]        The results of significant diagnostics from this hospitalization (including imaging, microbiology, ancillary and laboratory) are listed below for reference.    Significant Diagnostic Studies: Dg Chest 2 View  04/18/2015  CLINICAL DATA:  Acute onset of syncope.  Cough.  Initial encounter. EXAM: CHEST  2 VIEW COMPARISON:  Chest radiograph performed 06/17/2013, and CT of the chest performed 05/15/2013 FINDINGS: The lungs are mildly hyperexpanded, with flattening of the hemidiaphragms, compatible with COPD. Mild peribronchial thickening is noted. Scattered vascular calcifications are seen. Mild scarring is noted at the lung apices. The heart is normal in size. No acute osseous abnormalities are identified. Chronic left upper rib deformity is noted. IMPRESSION: Findings of COPD. Mild peribronchial thickening noted. Mild scarring at the lung apices. Electronically Signed   By: Roanna Raider M.D.   On: 04/18/2015 19:23    Microbiology: No results found for this or any previous visit (from the past 240 hour(s)).   Labs: Basic Metabolic Panel:  Recent Labs Lab 04/18/15 1840 04/19/15 0401 04/19/15 1334 04/20/15 0543 04/21/15 0530  NA 130* 134*  --  136 137  K 3.0* 3.5  --  3.5 4.0  CL 93* 99*  --  102 107  CO2 22 20*  --  23 22  GLUCOSE 99 103*  --  100* 99  BUN 35* 35*  --  25* 16  CREATININE 1.73* 1.47*  --  1.02 0.90  CALCIUM 8.3* 8.5*  --  8.5* 8.4*  MG  --   --  3.0*  --   --    Liver Function Tests:  Recent Labs Lab 04/18/15 1840 04/20/15 0543  AST 33 20  ALT 24 16*  ALKPHOS 24* 23*  BILITOT 0.6 0.5  PROT 5.4* 5.0*  ALBUMIN 3.1* 2.8*   No results for input(s): LIPASE, AMYLASE in the last 168 hours. No  results for input(s): AMMONIA in the last 168 hours. CBC:  Recent Labs Lab 04/18/15 1840 04/19/15 0401 04/20/15 0543  WBC 4.5 3.4* 3.2*  NEUTROABS 3.3  --   --   HGB 11.2* 11.5* 10.8*  HCT 33.7* 33.4* 33.2*  MCV 88.0 86.8 88.1  PLT 169 156 165   Cardiac Enzymes:  Recent Labs Lab 04/19/15 0401 04/19/15 0741 04/19/15 1334  TROPONINI 0.11* 0.09* 0.07*   BNP: BNP (last 3 results) No results for input(s): BNP in the last 8760 hours.  ProBNP (last 3 results) No results for input(s): PROBNP in the last 8760 hours.  CBG: No results for input(s): GLUCAP in the last 168 hours.   Signed:  Maudean Hoffmann, Gary [name]  [facility] 04/21/2015, 6:37 PM

## 2015-04-22 ENCOUNTER — Telehealth: Payer: Self-pay

## 2015-04-22 NOTE — Progress Notes (Signed)
PT Note - Late G code entry    05-20-2015 1548  PT G-Codes **NOT FOR INPATIENT CLASS**  Functional Assessment Tool Used clinical judgement  Functional Limitation Mobility: Walking and moving around  Mobility: Walking and Moving Around Current Status [phone]) CI  Mobility: Walking and Moving Around Goal Status [phone]) [facility] PT [phone]

## 2015-04-22 NOTE — Telephone Encounter (Signed)
Transition Care Management Follow-up Telephone Call     Date discharged? 04/1515        How have you been since you were released from the hospital? Health status improving   Any patient concerns? None   Do you understand why you were in the hospital? Yes   Do you understand the discharge instructions? Yes   Where were you discharged to? Home   Items Reviewed:  Medications reviewed: Yes  Allergies reviewed: Yes  Dietary changes reviewed: Yes  Referrals reviewed: Yes   Functional Questionnaire: I - Independent D- Dependent     Activities of Daily Living (ADLs):    Personal hygiene - I Dressing - I Eating - I Maintaining continence - D (may wear incontinence briefs as needed) Transferring - D - uses walker  Independent Activities of Daily Living (ADLs): Basic communication skills - I Transportation - D (no license) Meal preparation - D (son) Shopping - D (children) Housework - D (housekeeper)  Managing medications - D (son) Managing personal finances - D (daughter)   Confirmed importance and date/time of follow-up visits scheduled YES  Provider Appointment booked with PCP 04/28/15 @ 1PM  Confirmed with patient if condition begins to worsen call PCP or go to the ER.  Patient was given the office number and encouraged to call back with question or concerns: YES

## 2015-04-28 ENCOUNTER — Ambulatory Visit: Payer: Medicare Other | Admitting: Internal Medicine

## 2015-05-07 ENCOUNTER — Other Ambulatory Visit: Payer: Self-pay | Admitting: Cardiovascular Disease

## 2015-05-15 ENCOUNTER — Other Ambulatory Visit: Payer: Self-pay | Admitting: Cardiovascular Disease

## 2015-05-29 ENCOUNTER — Other Ambulatory Visit: Payer: Self-pay | Admitting: Cardiovascular Disease

## 2015-05-31 ENCOUNTER — Telehealth: Payer: Self-pay | Admitting: Cardiovascular Disease

## 2015-05-31 MED ORDER — FENOFIBRATE 145 MG PO TABS: 145.0000 mg | ORAL_TABLET | Freq: Every day | ORAL | Status: DC

## 2015-05-31 NOTE — Telephone Encounter (Signed)
Med called in for preferred formulary dose equivalence - pharmacy notified me that the 145mg  strength will be most affordable.  Pt's DPR contact notified and voiced thanks.

## 2015-05-31 NOTE — Telephone Encounter (Signed)
New message      Pt c/o medication issue:  1. Name of Medication: fenofibric acid 2. How are you currently taking this medication (dosage and times per day)? 135mg  daily 3. Are you having a reaction (difficulty breathing--STAT)? no  4. What is your medication issue? Medication is now 3 times more expensive than it was last time.  Is there something else he can take that is cheaper?

## 2015-05-31 NOTE — Telephone Encounter (Signed)
Call pharmacy.  Could be that this particular generic is no longer on their formulary.  Have pharmacy run the 145, 154 or 160 mg (they're all equivalent doses).  I've seen the 160 covered cheaper on one plan already this year.

## 2015-05-31 NOTE — Telephone Encounter (Signed)
REFILL 

## 2015-07-08 ENCOUNTER — Other Ambulatory Visit (HOSPITAL_COMMUNITY): Payer: Self-pay | Admitting: Cardiovascular Disease

## 2015-07-09 NOTE — Telephone Encounter (Signed)
Rx(s) sent to pharmacy electronically.  

## 2015-07-12 ENCOUNTER — Other Ambulatory Visit (HOSPITAL_COMMUNITY): Payer: Self-pay | Admitting: Cardiovascular Disease

## 2015-08-09 ENCOUNTER — Other Ambulatory Visit (HOSPITAL_COMMUNITY): Payer: Self-pay | Admitting: Cardiovascular Disease

## 2015-08-16 ENCOUNTER — Other Ambulatory Visit (HOSPITAL_COMMUNITY): Payer: Self-pay | Admitting: Cardiovascular Disease

## 2015-08-16 NOTE — Telephone Encounter (Signed)
Rx(s) sent to pharmacy electronically. Patient has not been seen in 2 years

## 2015-08-27 ENCOUNTER — Telehealth: Payer: Self-pay | Admitting: Cardiovascular Disease

## 2015-08-27 MED ORDER — FENOFIBRATE 145 MG PO TABS: 145.0000 mg | ORAL_TABLET | Freq: Every day | ORAL | Status: DC

## 2015-08-27 NOTE — Telephone Encounter (Signed)
Refill sent in with note stating must keep appt on 10/19/15 for further refills

## 2015-08-27 NOTE — Telephone Encounter (Signed)
New message       *STAT* If patient is at the pharmacy, call can be transferred to refill team.   1. Which medications need to be refilled? (please list name of each medication and dose if known) Fenofibrate 145 mg po daily  2. Which pharmacy/location (including street and city if local pharmacy) is medication to be sent to?[facility] on [address]  3. Do they need a 30 day or 90 day supply? 30 day

## 2015-09-11 ENCOUNTER — Other Ambulatory Visit: Payer: Self-pay | Admitting: Internal Medicine

## 2015-09-11 DIAGNOSIS — I10 Essential (primary) hypertension: Secondary | ICD-10-CM

## 2015-10-05 ENCOUNTER — Telehealth: Payer: Self-pay | Admitting: Cardiovascular Disease

## 2015-10-05 ENCOUNTER — Encounter: Payer: Self-pay | Admitting: Cardiovascular Disease

## 2015-10-05 NOTE — Telephone Encounter (Signed)
Closed encounter °

## 2015-10-19 ENCOUNTER — Ambulatory Visit: Payer: Medicare Other | Admitting: Cardiovascular Disease

## 2015-10-27 ENCOUNTER — Ambulatory Visit (INDEPENDENT_AMBULATORY_CARE_PROVIDER_SITE_OTHER): Payer: Medicare Other | Admitting: Cardiovascular Disease

## 2015-10-27 ENCOUNTER — Encounter: Payer: Self-pay | Admitting: Cardiovascular Disease

## 2015-10-27 DIAGNOSIS — E785 Hyperlipidemia, unspecified: Secondary | ICD-10-CM

## 2015-10-27 DIAGNOSIS — I779 Disorder of arteries and arterioles, unspecified: Secondary | ICD-10-CM | POA: Diagnosis not present

## 2015-10-27 DIAGNOSIS — I10 Essential (primary) hypertension: Secondary | ICD-10-CM

## 2015-10-27 DIAGNOSIS — I739 Peripheral vascular disease, unspecified: Secondary | ICD-10-CM

## 2015-10-27 MED ORDER — FENOFIBRATE 145 MG PO TABS: 145.0000 mg | ORAL_TABLET | Freq: Every day | ORAL | 3 refills | Status: DC

## 2015-10-27 MED ORDER — CLOPIDOGREL BISULFATE 75 MG PO TABS
75.0000 mg | ORAL_TABLET | Freq: Every day | ORAL | 3 refills | Status: DC
Start: 2015-10-27 — End: 2017-01-13

## 2015-10-27 MED ORDER — HYDRALAZINE HCL 50 MG PO TABS: 75.0000 mg | ORAL_TABLET | Freq: Every day | ORAL | 3 refills | Status: DC

## 2015-10-27 MED ORDER — METOPROLOL SUCCINATE ER 25 MG PO TB24
ORAL_TABLET | ORAL | 3 refills | Status: DC
Start: 2015-10-27 — End: 2015-12-29

## 2015-10-27 NOTE — Assessment & Plan Note (Signed)
History of carotid artery disease demonstrated by angiography 08/04/13. He had a type III arch is calcified, 95% proximal right internal carotid artery stenosis and 75-80% left. He did see Dr. Arbie Cookey back who felt he was too high risk for surgical intervention.

## 2015-10-27 NOTE — Addendum Note (Signed)
Addended by: Stann Mainland on: 10/27/2015 03:26 PM   Modules accepted: Orders

## 2015-10-27 NOTE — Patient Instructions (Signed)
Medication Instructions:  Your physician has recommended you make the following change in your medication:  1- CHANGE Hydralazine to 75 mg (1.5 tablets) by mouth once a day.   Follow-Up: Your physician wants you to follow-up in: 12 MONTHS WITH DR Allyson Sabal. You will receive a reminder letter in the mail two months in advance. If you don't receive a letter, please call our office to schedule the follow-up appointment.  If you need a refill on your cardiac medications before your next appointment, please call your pharmacy.

## 2015-10-27 NOTE — Assessment & Plan Note (Signed)
History of hyperlipidemia on statin therapy with recent lipid profile performed 04/19/15 revealing a total cholesterol 177, LDL 101 and HDL of 39

## 2015-10-27 NOTE — Assessment & Plan Note (Signed)
Gary [name] has a history of peripheral vascular disease status post remote left femoropopliteal bypass grafting by Dr. Arbie Cookey. I angiogram 10 08/04/13 revealing a patent left femoropopliteal bypass graft with a 95% calcified bilateral common femoral artery stenosis and an occluded right SFA. He has right greater than left lower extremity lifestyle limiting claudication.

## 2015-10-27 NOTE — Assessment & Plan Note (Signed)
History of hypertension blood pressure measured 108/64. He is on hydralazine and metoprolol. Continue current meds at current dosing

## 2015-10-27 NOTE — Progress Notes (Signed)
10/27/2015 Gary [name]   [birth year]  [id number]  Primary Physician Nicki ReaperBAITY, REGINA, NP Primary Cardiologist: Runell GessJonathan J Raeann Offner MD [name], FACC, FAHA, [place]  HPI:   Gary HarpsJohnnie H Lean is a [phone].o. male with a history of tobacco abuse, but quit smoking almost 16 years ago, hypertension, EtOH abuse, COPD, PAD, dyslipidemia, status post LFPBG by Dr. Arbie CookeyEarly remotely. I last saw him in the office 09/15/13. His last peripheral angiogram was September 2012 which showed a patent left fem-pop, 70% to 80% tandem SFA stenosis. He has increased renal velocities on the right and left on past renal Dopplers. He had a renal angiogram on 06/25/12 which revealed widely patent renal arteries. 2D echo in July 2014 revealed an EF of 55-60%, grade one diastolic dysfunction and peak PA pressures of 35mmHg. He was seen by Huey BienenstockBrian Hager PA-C 03/04/13. He denies chest pain or shortness of breath. He has had 20-30 pounds of unexplained weight loss which has been worked up by his primary care physician. He complains of being weak and somewhat off balance and walks with the aid of a walker. Recent lower extremity Doppler studies performed in our office 1/27//15 revealed an occluded right SFA which is a new finding and high-grade disease in his left common femoral and popliteal artery with a patent femoropopliteal bypass graft. He does appear to have a proximal anastomotic lesion.carotid Dopplers likewise showed high-grade bilateral right greater than left internal carotid artery stenosis. He underwent angiography by myself on 08/04/13 revealing a type III aortic arch, calcified arch vessels with a 95% proximal right internal carotid artery stenosis and 75-80% left. He had a patent left femoropopliteal bypass graft with 95% calcified bilateral common femoral artery stenoses and an occluded right SFA. He has right greater than lower extremity lifestyle limiting claudication. Since I saw him he has had mesenteric artery stenting resulting in  improvement in his weight. He also saw Dr. Arbie CookeyEarly back in the office who felt that he was too high risk and frail to pursue surgical revascularization of his carotid arteries.   Current Outpatient Prescriptions  Medication Sig Dispense Refill  . aspirin EC 81 MG tablet Take 81 mg by mouth at bedtime.    Marland Kitchen. atorvastatin (LIPITOR) 10 MG tablet Take 1 tablet (10 mg total) by mouth daily. 30 tablet 11  . clopidogrel (PLAVIX) 75 MG tablet Take 1 tablet (75 mg total) by mouth daily. 30 tablet 11  . co-enzyme Q-10 30 MG capsule Take 30 mg by mouth daily.    . fenofibrate (TRICOR) 145 MG tablet Take 1 tablet (145 mg total) by mouth daily. 30 tablet 2  . folic acid (FOLVITE) 1 MG tablet TAKE 1 TABLET (1 MG TOTAL) BY MOUTH DAILY. 30 tablet 0  . hydrALAZINE (APRESOLINE) 50 MG tablet Take 1.5 tablets (75 mg total) by mouth daily. Take 1.5 tablets (75 MG) by mouth ONCE A DAY. 135 tablet 3  . HYDROcodone-homatropine (HYCODAN) 5-1.5 MG/5ML syrup Take 5 mLs by mouth every 8 (eight) hours as needed for cough. 120 mL 0  . metoprolol succinate (TOPROL-XL) 25 MG 24 hr tablet TAKE 1 TABLET (25 MG TOTAL) BY MOUTH DAILY. 30 tablet 11  . pantoprazole (PROTONIX) 40 MG tablet Take 1 tablet (40 mg total) by mouth daily. 30 tablet 11  . tamsulosin (FLOMAX) 0.4 MG CAPS capsule Take 1 capsule (0.4 mg total) by mouth daily. 30 capsule 11  . vitamin B-12 (CYANOCOBALAMIN) 100 MCG tablet Take 100 mcg by mouth daily.  No current facility-administered medications for this visit.     Allergies  Allergen Reactions  . Contrast Media [Iodinated Diagnostic Agents] Itching  . Diovan [Valsartan] Swelling    angioedema  . Neomycin Hives  . Neosporin [Neomycin-Bacitracin Zn-Polymyx] Hives    Social History   Social History  . Marital status: Divorced    Spouse name: N/A  . Number of children: 3  . Years of education: N/A   Occupational History  . welder, pipe fitter-Retired    Social History Main Topics  . Smoking  status: Former Smoker    Packs/day: 1.00    Years: 50.00    Types: Cigarettes    Quit date: 03/03/1997  . Smokeless tobacco: Former Neurosurgeon    Quit date: 04/27/1998  . Alcohol use 1.2 oz/week    2 Cans of beer per week     Comment: " daily beer generally"  . Drug use:     Types: Marijuana  . Sexual activity: No   Other Topics Concern  . Not on file   Social History Narrative  . No narrative on file     Review of Systems: General: negative for chills, fever, night sweats or weight changes.  Cardiovascular: negative for chest pain, dyspnea on exertion, edema, orthopnea, palpitations, paroxysmal nocturnal dyspnea or shortness of breath Dermatological: negative for rash Respiratory: negative for cough or wheezing Urologic: negative for hematuria Abdominal: negative for nausea, vomiting, diarrhea, bright red blood per rectum, melena, or hematemesis Neurologic: negative for visual changes, syncope, or dizziness All other systems reviewed and are otherwise negative except as noted above.    Blood pressure 108/64, pulse 81, height 5\' 7"  (1.702 m), weight 124 lb 6.4 oz (56.4 kg), SpO2 95 %.  General appearance: alert and no distress Neck: no adenopathy, no JVD, supple, symmetrical, trachea midline, thyroid not enlarged, symmetric, no tenderness/mass/nodules and Soft bilateral carotid bruits Lungs: clear to auscultation bilaterally Heart: regular rate and rhythm, S1, S2 normal, no murmur, click, rub or gallop Extremities: extremities normal, atraumatic, no cyanosis or edema  EKG sinus rhythm at 81 with left ventricular hypertrophy and repolarization changes as well as PACs. He has septal Q waves. I personally reviewed this EKG  ASSESSMENT AND PLAN:   PVD, LFBPG 2004, dopplers show progression of disease bliat Mr. Bignell has a history of peripheral vascular disease status post remote left femoropopliteal bypass grafting by Dr. Arbie Cookey. I angiogram 10 08/04/13 revealing a patent left  femoropopliteal bypass graft with a 95% calcified bilateral common femoral artery stenosis and an occluded right SFA. He has right greater than left lower extremity lifestyle limiting claudication.  HTN  History of hypertension blood pressure measured 108/64. He is on hydralazine and metoprolol. Continue current meds at current dosing  HLD (hyperlipidemia) History of hyperlipidemia on statin therapy with recent lipid profile performed 04/19/15 revealing a total cholesterol 177, LDL 101 and HDL of 39  Carotid artery disease History of carotid artery disease demonstrated by angiography 08/04/13. He had a type III arch is calcified, 95% proximal right internal carotid artery stenosis and 75-80% left. He did see Dr. Arbie Cookey back who felt he was too high risk for surgical intervention.      Runell Gess MD FACP,FACC,FAHA, [facility] 10/27/2015 3:21 PM

## 2015-11-02 ENCOUNTER — Encounter: Payer: Self-pay | Admitting: Cardiology

## 2015-11-04 ENCOUNTER — Ambulatory Visit (INDEPENDENT_AMBULATORY_CARE_PROVIDER_SITE_OTHER): Payer: Medicare Other

## 2015-11-04 DIAGNOSIS — Z23 Encounter for immunization: Secondary | ICD-10-CM | POA: Diagnosis not present

## 2015-11-04 NOTE — Progress Notes (Signed)
Pt requested to get both PNA and Flu vaccines.

## 2015-11-12 ENCOUNTER — Other Ambulatory Visit: Payer: Self-pay

## 2015-11-12 MED ORDER — TAMSULOSIN HCL 0.4 MG PO CAPS: 0.4000 mg | ORAL_CAPSULE | Freq: Every day | ORAL | 0 refills | Status: DC

## 2015-11-12 NOTE — Telephone Encounter (Signed)
Rx sent electronically.  

## 2015-11-19 ENCOUNTER — Other Ambulatory Visit: Payer: Self-pay

## 2015-11-19 NOTE — Telephone Encounter (Signed)
This medication was not on current med list---pt had MCR with V[facility] Axe 10/2015... I saw in the chart where it says to d/c after discharge--please advise if okay to refill

## 2015-11-20 ENCOUNTER — Other Ambulatory Visit: Payer: Self-pay | Admitting: Internal Medicine

## 2015-11-20 DIAGNOSIS — I10 Essential (primary) hypertension: Secondary | ICD-10-CM

## 2015-11-22 NOTE — Telephone Encounter (Signed)
Call daughter and ask him if he has been taking this

## 2015-11-26 NOTE — Telephone Encounter (Signed)
I spoke to pt's daughter Gary [name] ok per HIPAA--she reports that she had requested wrong Rx for refill so no pt is not taking medication

## 2015-12-21 ENCOUNTER — Ambulatory Visit (INDEPENDENT_AMBULATORY_CARE_PROVIDER_SITE_OTHER): Payer: Medicare Other | Admitting: Internal Medicine

## 2015-12-21 ENCOUNTER — Telehealth: Payer: Self-pay | Admitting: Cardiovascular Disease

## 2015-12-21 ENCOUNTER — Encounter: Payer: Self-pay | Admitting: Internal Medicine

## 2015-12-21 VITALS — BP 106/68 | HR 71 | Temp 98.6°F | Wt 133.0 lb

## 2015-12-21 DIAGNOSIS — J301 Allergic rhinitis due to pollen: Secondary | ICD-10-CM

## 2015-12-21 DIAGNOSIS — H1132 Conjunctival hemorrhage, left eye: Secondary | ICD-10-CM

## 2015-12-21 NOTE — Telephone Encounter (Signed)
I spoke w daughter. She notes pt was seen at PCP office today - [facility] - daughter was concerned his BPs might be a little too low.  Was 106/68 today w HR of 71, comparable to last readings when seen by Dr. Allyson Sabal on 9/20. Dr. Allyson Sabal advised same med therapy at last visit.  She notes patient has had a fall recently and she was concerned this might be related to BP. Denies syncope, dizziness, etc.  I advised to keep track of BPs at home 1-2 times a day, follow for a week or so & follow up w [place]. She voiced agreement w plan -  will note if it runs lower at home, or if variability in the readings.  Routed to Dr. Allyson Sabal as Lorain Childes.

## 2015-12-21 NOTE — Patient Instructions (Signed)

## 2015-12-21 NOTE — Telephone Encounter (Signed)
Pt's blood pressure have been running low for about the last 3 weeks.He saw his primary doctor this morning,it was low. She told the daughter to contact Dr Allyson Sabal and see if his medicine might need to be adjusted.

## 2015-12-21 NOTE — Progress Notes (Signed)
Subjective:    Patient ID: Gary [name], male    DOB: [birth year], 80 y.o.   MRN: [id number]  HPI  Pt presents to the clinic today with c/o left eye redness and irritation. He noticed this 4 years ago. His left eye is itchy. He has noticed matting of both of his eyes. He also reports runny nose and nasal congestion. He denies fever, chills or body aches. He has take Mucinex and allergy eye drops without any relief.  Review of Systems      Past Medical History:  Diagnosis Date  . Alcohol abuse   . Carotid artery disease (HCC)   . COPD (chronic obstructive pulmonary disease) (HCC)    emphysematous changes on past CT  . Coronary artery disease 04/27/2011   Low risk Myoview in 2010:Non-obstructive disase, except for small OMB 95% ostial lesion  . Dyslipidemia   . GERD (gastroesophageal reflux disease)   . H/O ETOH abuse   . Hypertension   . Pneumonia   . PVD (peripheral vascular disease) (HCC) 04/27/2011   normal renal & abdominal aortography; s/p Left Fem-Pop BPG 2004.    Current Outpatient Prescriptions  Medication Sig Dispense Refill  . aspirin EC 81 MG tablet Take 81 mg by mouth at bedtime.    Marland Kitchen atorvastatin (LIPITOR) 10 MG tablet Take 1 tablet (10 mg total) by mouth daily. 30 tablet 11  . chlorthalidone (HYGROTON) 25 MG tablet     . clopidogrel (PLAVIX) 75 MG tablet Take 1 tablet (75 mg total) by mouth daily. 90 tablet 3  . co-enzyme Q-10 30 MG capsule Take 30 mg by mouth daily.    . fenofibrate (TRICOR) 145 MG tablet Take 1 tablet (145 mg total) by mouth daily. 90 tablet 3  . folic acid (FOLVITE) 1 MG tablet TAKE 1 TABLET (1 MG TOTAL) BY MOUTH DAILY. 30 tablet 0  . hydrALAZINE (APRESOLINE) 50 MG tablet Take 1.5 tablets (75 mg total) by mouth daily. Take 1.5 tablets (75 MG) by mouth ONCE A DAY. 135 tablet 3  . metoprolol succinate (TOPROL-XL) 25 MG 24 hr tablet TAKE 1 TABLET (25 MG TOTAL) BY MOUTH DAILY. 90 tablet 3  . pantoprazole (PROTONIX) 40 MG tablet Take 1 tablet  (40 mg total) by mouth daily. 30 tablet 11  . tamsulosin (FLOMAX) 0.4 MG CAPS capsule Take 1 capsule (0.4 mg total) by mouth daily. 90 capsule 0  . vitamin B-12 (CYANOCOBALAMIN) 100 MCG tablet Take 100 mcg by mouth daily.     No current facility-administered medications for this visit.     Allergies  Allergen Reactions  . Contrast Media [Iodinated Diagnostic Agents] Itching  . Diovan [Valsartan] Swelling    angioedema  . Neomycin Hives  . Neosporin [Neomycin-Bacitracin Zn-Polymyx] Hives    Family History  Problem Relation Age of Onset  . CAD Brother     also stroke  . Hypertension Brother   . Diabetes Brother   . Hypertension Father     also stroke  . Stroke Father   . Kidney cancer Mother   . Cancer Mother     Renal  . Cancer Sister     Lung  . COPD Sister   . Hypertension Sister   . Hypertension Brother      X 2    Social History   Social History  . Marital status: Divorced    Spouse name: N/A  . Number of children: 3  . Years of education: N/A   Occupational History  .  welder, pipe fitter-Retired    Social History Main Topics  . Smoking status: Former Smoker    Packs/day: 1.00    Years: 50.00    Types: Cigarettes    Quit date: 03/03/1997  . Smokeless tobacco: Former NeurosurgeonUser    Quit date: 04/27/1998  . Alcohol use 1.2 oz/week    2 Cans of beer per week     Comment: " daily beer generally"  . Drug use:     Types: Marijuana  . Sexual activity: No   Other Topics Concern  . Not on file   Social History Narrative  . No narrative on file     Constitutional: Denies fever, malaise, fatigue, headache or abrupt weight changes.  HEENT: Pt reports left eye redness, runny nose, nasal congestion. Denies eye pain, ear pain, ringing in the ears, wax buildup, bloody nose, or sore throat. Respiratory: Denies difficulty breathing, shortness of breath, cough or sputum production.    No other specific complaints in a complete review of systems (except as listed in  HPI above).  Objective:   Physical Exam   BP 106/68   Pulse 71   Temp 98.6 F (37 C) (Oral)   Wt 133 lb (60.3 kg)   SpO2 97%   BMI 20.83 kg/m  Wt Readings from Last 3 Encounters:  12/21/15 133 lb (60.3 kg)  10/27/15 124 lb 6.4 oz (56.4 kg)  04/20/15 142 lb 13.7 oz (64.8 kg)    General: Appears his stated age, in NAD. HEENT: Head: normal shape and size; Left Eye: sclera with hemorrhage ntoed, no icterus, conjunctiva pink, PERRLA and EOMs intact;  Pulmonary/Chest: Normal effort and positive vesicular breath sounds. No respiratory distress. No wheezes, rales or ronchi noted.   BMET    Component Value Date/Time   NA 137 04/21/2015 0530   K 4.0 04/21/2015 0530   CL 107 04/21/2015 0530   CO2 22 04/21/2015 0530   GLUCOSE 99 04/21/2015 0530   BUN 16 04/21/2015 0530   CREATININE 0.90 04/21/2015 0530   CREATININE 0.73 07/28/2013 0934   CALCIUM 8.4 (L) 04/21/2015 0530   GFRNONAA >60 04/21/2015 0530   GFRAA >60 04/21/2015 0530    Lipid Panel     Component Value Date/Time   CHOL 177 04/19/2015 0401   TRIG 183 (H) 04/19/2015 0401   HDL 39 (L) 04/19/2015 0401   CHOLHDL 4.5 04/19/2015 0401   VLDL 37 04/19/2015 0401   LDLCALC 101 (H) 04/19/2015 0401    CBC    Component Value Date/Time   WBC 3.2 (L) 04/20/2015 0543   RBC 3.60 (L) 04/21/2015 0530   RBC 3.77 (L) 04/20/2015 0543   HGB 10.8 (L) 04/20/2015 0543   HCT 33.2 (L) 04/20/2015 0543   PLT 165 04/20/2015 0543   MCV 88.1 04/20/2015 0543   MCH 28.6 04/20/2015 0543   MCHC 32.5 04/20/2015 0543   RDW 13.4 04/20/2015 0543   LYMPHSABS 0.7 04/18/2015 1840   MONOABS 0.4 04/18/2015 1840   EOSABS 0.0 04/18/2015 1840   BASOSABS 0.0 04/18/2015 1840    Hgb A1C Lab Results  Component Value Date   HGBA1C 5.0 01/16/2014        Assessment & Plan:   Subconjunctival hemorrhage, left eye:  Advised him that this will resolve with time Warm compresses as needed for comfort Saline eye drops to left eye as needed for  comfort  Allergic Rhinitis:  Start Claritin and Flonase OTC Stop Mucinex  RTC as needed or if symptoms persist or  worsen Nicki Reaper, NP

## 2015-12-25 ENCOUNTER — Telehealth: Payer: Self-pay | Admitting: Internal Medicine

## 2015-12-25 DIAGNOSIS — I10 Essential (primary) hypertension: Secondary | ICD-10-CM

## 2015-12-29 ENCOUNTER — Telehealth: Payer: Self-pay | Admitting: Pharmacist Clinician (PhC)/ Clinical Pharmacy Specialist

## 2015-12-29 ENCOUNTER — Telehealth: Payer: Self-pay | Admitting: Internal Medicine

## 2015-12-29 MED ORDER — HYDRALAZINE HCL 50 MG PO TABS: 50.0000 mg | ORAL_TABLET | Freq: Every day | ORAL | 3 refills | Status: DC

## 2015-12-29 MED ORDER — METOPROLOL SUCCINATE ER 25 MG PO TB24
ORAL_TABLET | ORAL | 0 refills | Status: DC
Start: 2015-12-29 — End: 2016-04-08

## 2015-12-29 NOTE — Telephone Encounter (Signed)
Spoke to daughter. She is requesting call back with status of refill request for metoprolol.  Please advise

## 2015-12-29 NOTE — Telephone Encounter (Signed)
Spoke to pt daughter. She will call back and schedule AWV/OV 30 with Nicki Reaper.

## 2015-12-29 NOTE — Telephone Encounter (Signed)
Spoke with Shawna Orleans at Dr. Janith Lima office.  Family was trying to refill hydralazine and metoprolol, was told by pharmacy that the prescriptions were cancelled.  We have no record of cancelling these (they are still active in EPIC).  Patient has has problems with low blood pressures since visit with Dr. Allyson Sabal in October.  When family called with BP readings family was told to monitor readings at home for 1-2 weeks then follow up with our office.    Reviewed information, based on patient recent fall, age and BP information, advised that hydralazine be decreased from 75 mg tid to 50 mg tid for now.  Patient should continue with metoprolol.    Shawna Orleans will contact family with this information.

## 2015-12-29 NOTE — Telephone Encounter (Signed)
Called [facility] and I was told that the Rx from 10/27/15 #90 with 3 refills had  Been inactivated and they did not have a note explaining who canceled or why..... I then called [facility]--Dr Allyson Sabal is out of the office---was forwarded to Clinical Pharmacists Kristen---she advised that decreasing the hydralizine to 50mg  and to continue the metorpolol.... I conveyed information to [facility] and she was agreeable to the change, gave verbal ok for 1 refill of metoprolol.... I spoke to daughter [name] and advised her of the changes and to continue to record pt's blood pressure to f/u call in next week. Rx for metoprolol sent to pharmacy as instructed and [name] is aware

## 2016-01-09 ENCOUNTER — Other Ambulatory Visit: Payer: Self-pay | Admitting: Internal Medicine

## 2016-01-09 DIAGNOSIS — I10 Essential (primary) hypertension: Secondary | ICD-10-CM

## 2016-01-22 ENCOUNTER — Other Ambulatory Visit: Payer: Self-pay | Admitting: Internal Medicine

## 2016-01-22 DIAGNOSIS — E785 Hyperlipidemia, unspecified: Secondary | ICD-10-CM

## 2016-01-22 DIAGNOSIS — I10 Essential (primary) hypertension: Secondary | ICD-10-CM

## 2016-02-21 ENCOUNTER — Encounter: Payer: Self-pay | Admitting: Internal Medicine

## 2016-02-21 ENCOUNTER — Ambulatory Visit (INDEPENDENT_AMBULATORY_CARE_PROVIDER_SITE_OTHER): Payer: Medicare Other | Admitting: Internal Medicine

## 2016-02-21 VITALS — BP 110/68 | HR 76 | Temp 98.0°F | Wt 128.0 lb

## 2016-02-21 DIAGNOSIS — R110 Nausea: Secondary | ICD-10-CM | POA: Diagnosis not present

## 2016-02-21 DIAGNOSIS — R0981 Nasal congestion: Secondary | ICD-10-CM

## 2016-02-21 DIAGNOSIS — R531 Weakness: Secondary | ICD-10-CM

## 2016-02-21 MED ORDER — LORATADINE 10 MG PO TABS: 10.0000 mg | ORAL_TABLET | Freq: Every day | ORAL | 0 refills | Status: DC

## 2016-02-21 MED ORDER — MOMETASONE FUROATE 50 MCG/ACT NA SUSP: 2.0000 | Freq: Every day | NASAL | 0 refills | Status: AC

## 2016-02-21 NOTE — Progress Notes (Signed)
Subjective:    Patient ID: Gary [name], male    DOB: Jun 30, 1935, 81 y.o.   MRN: [id number]  HPI  Pt presents to the clinic today with c/o nasal congestion. This started 2 weeks ago. He is blowing blood tinged clear mucous out of his nose.  He reports associated nausea, not sure if this is from the mucous our the medication. He has felt very weak as well. He denies ear pain, sore throat or cough. He has tried Mucinex, Hycodan and Tussin with minimal relief. He has not had sick contact that he is aware of. He has had his flu shot.  Review of Systems      Past Medical History:  Diagnosis Date  . Alcohol abuse   . Carotid artery disease (HCC)   . COPD (chronic obstructive pulmonary disease) (HCC)    emphysematous changes on past CT  . Coronary artery disease 04/27/2011   Low risk Myoview in 2010:Non-obstructive disase, except for small OMB 95% ostial lesion  . Dyslipidemia   . GERD (gastroesophageal reflux disease)   . H/O ETOH abuse   . Hypertension   . Pneumonia   . PVD (peripheral vascular disease) (HCC) 04/27/2011   normal renal & abdominal aortography; s/p Left Fem-Pop BPG 2004.    Current Outpatient Prescriptions  Medication Sig Dispense Refill  . aspirin EC 81 MG tablet Take 81 mg by mouth at bedtime.    Marland Kitchen atorvastatin (LIPITOR) 10 MG tablet TAKE 1 TABLET BY MOUTH EVERY DAY 30 tablet 1  . clopidogrel (PLAVIX) 75 MG tablet Take 1 tablet (75 mg total) by mouth daily. 90 tablet 3  . co-enzyme Q-10 30 MG capsule Take 30 mg by mouth daily.    . fenofibrate (TRICOR) 145 MG tablet Take 1 tablet (145 mg total) by mouth daily. 90 tablet 3  . folic acid (FOLVITE) 1 MG tablet TAKE 1 TABLET (1 MG TOTAL) BY MOUTH DAILY. 30 tablet 0  . hydrALAZINE (APRESOLINE) 50 MG tablet TAKE 1.5 TABLETS BY MOUTH 2 TIMES DAILY *MUST SCHEDULE ANNUAL EXAM FOR MORE REFILLS* 45 tablet 2  . metoprolol succinate (TOPROL-XL) 25 MG 24 hr tablet TAKE 1 TABLET (25 MG TOTAL) BY MOUTH DAILY. 90 tablet 0  .  pantoprazole (PROTONIX) 40 MG tablet Take 1 tablet (40 mg total) by mouth daily. 30 tablet 11  . tamsulosin (FLOMAX) 0.4 MG CAPS capsule Take 1 capsule (0.4 mg total) by mouth daily. 90 capsule 0  . vitamin B-12 (CYANOCOBALAMIN) 100 MCG tablet Take 100 mcg by mouth daily.     No current facility-administered medications for this visit.     Allergies  Allergen Reactions  . Contrast Media [Iodinated Diagnostic Agents] Itching  . Diovan [Valsartan] Swelling    angioedema  . Neomycin Hives  . Neosporin [Neomycin-Bacitracin Zn-Polymyx] Hives    Family History  Problem Relation Age of Onset  . CAD Brother     also stroke  . Hypertension Brother   . Diabetes Brother   . Hypertension Father     also stroke  . Stroke Father   . Kidney cancer Mother   . Cancer Mother     Renal  . Cancer Sister     Lung  . COPD Sister   . Hypertension Sister   . Hypertension Brother      X 2    Social History   Social History  . Marital status: Divorced    Spouse name: N/A  . Number of children: 3  .  Years of education: N/A   Occupational History  . welder, pipe fitter-Retired    Social History Main Topics  . Smoking status: Former Smoker    Packs/day: 1.00    Years: 50.00    Types: Cigarettes    Quit date: 03/03/1997  . Smokeless tobacco: Former Neurosurgeon    Quit date: 04/27/1998  . Alcohol use 1.2 oz/week    2 Cans of beer per week     Comment: " daily beer generally"  . Drug use:     Types: Marijuana  . Sexual activity: No   Other Topics Concern  . Not on file   Social History Narrative  . No narrative on file     Constitutional: Denies fever, malaise, fatigue, headache or abrupt weight changes.  HEENT: Pt reports nasal congestion. Denies eye pain, eye redness, ear pain, ringing in the ears, wax buildup, runny nose, bloody nose, or sore throat. Respiratory: Pt reports chronic shortness of breath. Denies difficulty breathing, cough or sputum production.   Gastrointestinal: Pt  reports nausea. Denies abdominal pain, bloating, constipation, diarrhea or blood in the stool.  Musculoskeletal: Pt reports feeling weak. Denies decrease in range of motion, difficulty with gait, muscle pain or joint pain and swelling.   No other specific complaints in a complete review of systems (except as listed in HPI above).  Objective:   Physical Exam   BP 110/68   Pulse 76   Temp 98 F (36.7 C) (Oral)   Wt 128 lb (58.1 kg)   SpO2 97%   BMI 20.05 kg/m  Wt Readings from Last 3 Encounters:  02/21/16 128 lb (58.1 kg)  12/21/15 133 lb (60.3 kg)  10/27/15 124 lb 6.4 oz (56.4 kg)    General: Appears his stated age, chronically ill appearing, in NAD. HEENT: Head: normal shape and size, no sinus tenderness noted; Nose: mucosa boggy and moist, septum midline; Throat/Mouth: Teeth present, mucosa pink and moist, no exudate, lesions or ulcerations noted.  Neck:  No adenopathy noted. Pulmonary/Chest: Normal effort and coarse vesicular breath sounds. No respiratory distress. No wheezes, rales or ronchi noted.  Abdomen: Soft and nontender. Normal bowel sounds.  Musculoskeletal: Gait, slow but steady.  BMET    Component Value Date/Time   NA 137 04/21/2015 0530   K 4.0 04/21/2015 0530   CL 107 04/21/2015 0530   CO2 22 04/21/2015 0530   GLUCOSE 99 04/21/2015 0530   BUN 16 04/21/2015 0530   CREATININE 0.90 04/21/2015 0530   CREATININE 0.73 07/28/2013 0934   CALCIUM 8.4 (L) 04/21/2015 0530   GFRNONAA >60 04/21/2015 0530   GFRAA >60 04/21/2015 0530    Lipid Panel     Component Value Date/Time   CHOL 177 04/19/2015 0401   TRIG 183 (H) 04/19/2015 0401   HDL 39 (L) 04/19/2015 0401   CHOLHDL 4.5 04/19/2015 0401   VLDL 37 04/19/2015 0401   LDLCALC 101 (H) 04/19/2015 0401    CBC    Component Value Date/Time   WBC 3.2 (L) 04/20/2015 0543   RBC 3.60 (L) 04/21/2015 0530   RBC 3.77 (L) 04/20/2015 0543   HGB 10.8 (L) 04/20/2015 0543   HCT 33.2 (L) 04/20/2015 0543   PLT 165  04/20/2015 0543   MCV 88.1 04/20/2015 0543   MCH 28.6 04/20/2015 0543   MCHC 32.5 04/20/2015 0543   RDW 13.4 04/20/2015 0543   LYMPHSABS 0.7 04/18/2015 1840   MONOABS 0.4 04/18/2015 1840   EOSABS 0.0 04/18/2015 1840   BASOSABS 0.0  04/18/2015 1840    Hgb A1C Lab Results  Component Value Date   HGBA1C 5.0 01/16/2014           Assessment & Plan:   Nasal congestion, nausea and weakness:  Allergies vs viral sinusitis eRX for Nasonex in am eRx for Claritin in pm Get some rest and drink plenty of fluids Continue Hycodan for cough If worsens, will cover with Amoxicillin  RTC as needed or if symptoms persist or worsen BAITY, REGINA, NP

## 2016-02-21 NOTE — Patient Instructions (Signed)

## 2016-02-22 ENCOUNTER — Other Ambulatory Visit: Payer: Self-pay | Admitting: Internal Medicine

## 2016-03-19 ENCOUNTER — Other Ambulatory Visit: Payer: Self-pay | Admitting: Internal Medicine

## 2016-03-24 ENCOUNTER — Telehealth: Payer: Self-pay

## 2016-03-24 MED ORDER — HYDROCODONE-HOMATROPINE 5-1.5 MG/5ML PO SYRP: 5.0000 mL | ORAL_SOLUTION | Freq: Three times a day (TID) | ORAL | 0 refills | Status: DC | PRN

## 2016-03-24 NOTE — Telephone Encounter (Signed)
PLEASE NOTE: All timestamps contained within this report are represented as [place] Standard Time. CONFIDENTIALTY NOTICE: This fax transmission is intended only for the addressee. It contains information that is legally privileged, confidential or otherwise protected from use or disclosure. If you are not the intended recipient, you are strictly prohibited from reviewing, disclosing, copying using or disseminating any of this information or taking any action in reliance on or regarding this information. If you have received this fax in error, please notify us immediately by telephone so that we can arrange for its return to [place]. Phone: [phone], Toll-Free: [phone], Fax: [phone] Page: 1 of 1 Call Id: [id number] [facility] Day - Client Nonclinical Telephone Record [facility] Client [facility] Day - Client Client Site [facility] [place] - Day Physician Nicki Reaper - NP Contact Type Call Who Is Calling Gary / Member / Family / Caregiver Caller Name Gary [name] Phone Number [phone] Gary [name] Call Type Message Only Information Provided Reason for Call Request for General Office Information Initial Comment Caller says her father was prescribed a cough medication in the middle of the year and is wanting the NP to be called in for her father tomorrow and would like a call back tomorrow. Declined triage. Additional Comment Call Closed By: Amanda Pea Transaction Date/Time: 03/23/2016 5:08:08 PM (ET)

## 2016-03-24 NOTE — Telephone Encounter (Signed)
[name] called regarding rx.  Cell is  [phone]

## 2016-03-24 NOTE — Telephone Encounter (Signed)
[name] pt daughter request rx for hycodan. [name] said pt was seen 02/21/16 for nasal congestion and weakness; pt has since developed a non prod cough and no fever. Request hycodan which was last filled for 04/15/15. [name] request to pick up rx at [facility] office and I advised if approved would come from one of our providers at [facility]. [name] voiced understanding. R baity NP is out of office today.

## 2016-03-24 NOTE — Telephone Encounter (Signed)
Spoke to daughter [name]-- she states she is not sure if she could make it on her lunch and she does not get off until 5pm---I offered pt to stay after 5pm to wait on her or even [place]--- she states she is unsure and will let me know

## 2016-03-24 NOTE — Telephone Encounter (Signed)
Printed.  Thanks.  

## 2016-03-24 NOTE — Telephone Encounter (Signed)
Rx placed in front office for son in law to pick up

## 2016-03-30 ENCOUNTER — Other Ambulatory Visit: Payer: Self-pay | Admitting: Internal Medicine

## 2016-03-30 DIAGNOSIS — I10 Essential (primary) hypertension: Secondary | ICD-10-CM

## 2016-03-30 DIAGNOSIS — E785 Hyperlipidemia, unspecified: Secondary | ICD-10-CM

## 2016-04-08 ENCOUNTER — Other Ambulatory Visit: Payer: Self-pay | Admitting: Internal Medicine

## 2016-04-08 DIAGNOSIS — I10 Essential (primary) hypertension: Secondary | ICD-10-CM

## 2016-06-09 ENCOUNTER — Other Ambulatory Visit: Payer: Self-pay

## 2016-06-09 DIAGNOSIS — E782 Mixed hyperlipidemia: Secondary | ICD-10-CM

## 2016-06-09 DIAGNOSIS — I10 Essential (primary) hypertension: Secondary | ICD-10-CM

## 2016-06-09 MED ORDER — ATORVASTATIN CALCIUM 10 MG PO TABS: 10.0000 mg | ORAL_TABLET | Freq: Every day | ORAL | 0 refills | Status: DC

## 2016-06-16 ENCOUNTER — Encounter: Payer: Self-pay | Admitting: Internal Medicine

## 2016-06-16 ENCOUNTER — Ambulatory Visit (INDEPENDENT_AMBULATORY_CARE_PROVIDER_SITE_OTHER): Payer: Medicare Other | Admitting: Internal Medicine

## 2016-06-16 VITALS — BP 110/68 | HR 76 | Temp 98.1°F | Ht 67.0 in | Wt 127.8 lb

## 2016-06-16 DIAGNOSIS — N401 Enlarged prostate with lower urinary tract symptoms: Secondary | ICD-10-CM

## 2016-06-16 DIAGNOSIS — J449 Chronic obstructive pulmonary disease, unspecified: Secondary | ICD-10-CM | POA: Diagnosis not present

## 2016-06-16 DIAGNOSIS — Z0000 Encounter for general adult medical examination without abnormal findings: Secondary | ICD-10-CM | POA: Diagnosis not present

## 2016-06-16 DIAGNOSIS — E7800 Pure hypercholesterolemia, unspecified: Secondary | ICD-10-CM | POA: Diagnosis not present

## 2016-06-16 DIAGNOSIS — Z125 Encounter for screening for malignant neoplasm of prostate: Secondary | ICD-10-CM | POA: Diagnosis not present

## 2016-06-16 DIAGNOSIS — R351 Nocturia: Secondary | ICD-10-CM

## 2016-06-16 DIAGNOSIS — I739 Peripheral vascular disease, unspecified: Secondary | ICD-10-CM

## 2016-06-16 DIAGNOSIS — I10 Essential (primary) hypertension: Secondary | ICD-10-CM | POA: Diagnosis not present

## 2016-06-16 DIAGNOSIS — K219 Gastro-esophageal reflux disease without esophagitis: Secondary | ICD-10-CM | POA: Diagnosis not present

## 2016-06-16 DIAGNOSIS — I2510 Atherosclerotic heart disease of native coronary artery without angina pectoris: Secondary | ICD-10-CM

## 2016-06-16 DIAGNOSIS — F5101 Primary insomnia: Secondary | ICD-10-CM | POA: Diagnosis not present

## 2016-06-16 DIAGNOSIS — I779 Disorder of arteries and arterioles, unspecified: Secondary | ICD-10-CM | POA: Diagnosis not present

## 2016-06-16 LAB — COMPREHENSIVE METABOLIC PANEL
ALK PHOS: 39 U/L (ref 39–117)
ALT: 9 U/L (ref 0–53)
AST: 19 U/L (ref 0–37)
Albumin: 3.4 g/dL — ABNORMAL LOW (ref 3.5–5.2)
BUN: 18 mg/dL (ref 6–23)
CO2: 25 meq/L (ref 19–32)
Calcium: 8.6 mg/dL (ref 8.4–10.5)
Chloride: 99 mEq/L (ref 96–112)
Creatinine, Ser: 1.25 mg/dL (ref 0.40–1.50)
GFR: 59.26 mL/min — AB (ref >60.00)
GLUCOSE: 92 mg/dL (ref 70–99)
POTASSIUM: 3.6 meq/L (ref 3.5–5.1)
Sodium: 132 mEq/L — ABNORMAL LOW (ref 135–145)
Total Bilirubin: 0.5 mg/dL (ref 0.2–1.2)
Total Protein: 5.6 g/dL — ABNORMAL LOW (ref 6.0–8.3)

## 2016-06-16 LAB — CBC
HCT: 35.9 % — ABNORMAL LOW (ref 39.0–52.0)
HEMOGLOBIN: 12.3 g/dL — AB (ref 13.0–17.0)
MCHC: 34.3 g/dL (ref 30.0–36.0)
MCV: 93.7 fl (ref 78.0–100.0)
Platelets: 245 10*3/uL (ref 150.0–400.0)
RBC: 3.83 Mil/uL — ABNORMAL LOW (ref 4.22–5.81)
RDW: 14.5 % (ref 11.5–15.5)
WBC: 7.4 10*3/uL (ref 4.0–10.5)

## 2016-06-16 LAB — LIPID PANEL
CHOLESTEROL: 238 mg/dL — AB (ref 0–200)
HDL: 92.8 mg/dL (ref >39.00)
LDL Cholesterol: 120 mg/dL — ABNORMAL HIGH (ref 0–99)
NonHDL: 144.92
Total CHOL/HDL Ratio: 3
Triglycerides: 127 mg/dL (ref 0.0–149.0)
VLDL: 25.4 mg/dL (ref 0.0–40.0)

## 2016-06-16 LAB — PSA, MEDICARE: PSA: 0.75 ng/mL (ref 0.10–4.00)

## 2016-06-16 MED ORDER — PANTOPRAZOLE SODIUM 40 MG PO TBEC: 40.0000 mg | DELAYED_RELEASE_TABLET | Freq: Every day | ORAL | 11 refills | Status: DC

## 2016-06-16 NOTE — Patient Instructions (Signed)
 Health Maintenance, Male A healthy lifestyle and preventive care is important for your health and wellness. Ask your health care provider about what schedule of regular examinations is right for you. What should I know about weight and diet?  Eat a Healthy Diet  Eat plenty of vegetables, fruits, whole grains, low-fat dairy products, and lean protein.  Do not eat a lot of foods high in solid fats, added sugars, or salt. Maintain a Healthy Weight  Regular exercise can help you achieve or maintain a healthy weight. You should:  Do at least 150 minutes of exercise each week. The exercise should increase your heart rate and make you sweat (moderate-intensity exercise).  Do strength-training exercises at least twice a week. Watch Your Levels of Cholesterol and Blood Lipids  Have your blood tested for lipids and cholesterol every 5 years starting at 79 years of age. If you are at high risk for heart disease, you should start having your blood tested when you are 79 years old. You may need to have your cholesterol levels checked more often if:  Your lipid or cholesterol levels are high.  You are older than 79 years of age.  You are at high risk for heart disease. What should I know about cancer screening? Many types of cancers can be detected early and may often be prevented. Lung Cancer  You should be screened every year for lung cancer if:  You are a current smoker who has smoked for at least 30 years.  You are a former smoker who has quit within the past 15 years.  Talk to your health care provider about your screening options, when you should start screening, and how often you should be screened. Colorectal Cancer  Routine colorectal cancer screening usually begins at 79 years of age and should be repeated every 5-10 years until you are 79 years old. You may need to be screened more often if early forms of precancerous polyps or small growths are found. Your health care provider  may recommend screening at an earlier age if you have risk factors for colon cancer.  Your health care provider may recommend using home test kits to check for hidden blood in the stool.  A small camera at the end of a tube can be used to examine your colon (sigmoidoscopy or colonoscopy). This checks for the earliest forms of colorectal cancer. Prostate and Testicular Cancer  Depending on your age and overall health, your health care provider may do certain tests to screen for prostate and testicular cancer.  Talk to your health care provider about any symptoms or concerns you have about testicular or prostate cancer. Skin Cancer  Check your skin from head to toe regularly.  Tell your health care provider about any new moles or changes in moles, especially if:  There is a change in a mole's size, shape, or color.  You have a mole that is larger than a pencil eraser.  Always use sunscreen. Apply sunscreen liberally and repeat throughout the day.  Protect yourself by wearing long sleeves, pants, a wide-brimmed hat, and sunglasses when outside. What should I know about heart disease, diabetes, and high blood pressure?  If you are 18-39 years of age, have your blood pressure checked every 3-5 years. If you are 40 years of age or older, have your blood pressure checked every year. You should have your blood pressure measured twice-once when you are at a hospital or clinic, and once when you are not at   a hospital or clinic. Record the average of the two measurements. To check your blood pressure when you are not at a hospital or clinic, you can use:  An automated blood pressure machine at a pharmacy.  A home blood pressure monitor.  Talk to your health care provider about your target blood pressure.  If you are between 45-79 years old, ask your health care provider if you should take aspirin to prevent heart disease.  Have regular diabetes screenings by checking your fasting blood sugar  level.  If you are at a normal weight and have a low risk for diabetes, have this test once every three years after the age of 45.  If you are overweight and have a high risk for diabetes, consider being tested at a younger age or more often.  A one-time screening for abdominal aortic aneurysm (AAA) by ultrasound is recommended for men aged 65-75 years who are current or former smokers. What should I know about preventing infection? Hepatitis B  If you have a higher risk for hepatitis B, you should be screened for this virus. Talk with your health care provider to find out if you are at risk for hepatitis B infection. Hepatitis C  Blood testing is recommended for:  Everyone born from 1945 through 1965.  Anyone with known risk factors for hepatitis C. Sexually Transmitted Diseases (STDs)  You should be screened each year for STDs including gonorrhea and chlamydia if:  You are sexually active and are younger than 79 years of age.  You are older than 79 years of age and your health care provider tells you that you are at risk for this type of infection.  Your sexual activity has changed since you were last screened and you are at an increased risk for chlamydia or gonorrhea. Ask your health care provider if you are at risk.  Talk with your health care provider about whether you are at high risk of being infected with HIV. Your health care provider may recommend a prescription medicine to help prevent HIV infection. What else can I do?  Schedule regular health, dental, and eye exams.  Stay current with your vaccines (immunizations).  Do not use any tobacco products, such as cigarettes, chewing tobacco, and e-cigarettes. If you need help quitting, ask your health care provider.  Limit alcohol intake to no more than 2 drinks per day. One drink equals 12 ounces of beer, 5 ounces of wine, or 1 ounces of hard liquor.  Do not use street drugs.  Do not share needles.  Ask your health  care provider for help if you need support or information about quitting drugs.  Tell your health care provider if you often feel depressed.  Tell your health care provider if you have ever been abused or do not feel safe at home. This information is not intended to replace advice given to you by your health care provider. Make sure you discuss any questions you have with your health care provider. Document Released: 07/22/2007 Document Revised: 09/22/2015 Document Reviewed: 10/27/2014 Elsevier Interactive Patient Education  2017 Elsevier Inc.  

## 2016-06-16 NOTE — Progress Notes (Signed)
HPI:  Pt presents to the clinic today for his Medicare Wellness Exam. He is also due for follow up of chronic conditions.  HLD with CAD: His last LDL was 101. He is taking Lipitor, ASA, Plavix and Fenofibrate as prescribed. He has not been having any chest pain. He follows with Dr. Allyson Sabal.  COPD: He no longer smokes. He is not taking any inhalers. He is not following with pulmonology at this point.  GERD: Controlled with Protonix, when he takes it. He denies breakthrough symptoms, when he takes it. He is requesting a refill of Protonix today.   HTN: His BP today is 110/68. He is taking Hydralazine and Metoprolol as prescribed. He follows with Dr. Allyson Sabal. ECG from 10/2015 reviewed.  BPH: He is voiding fine on Flomax. Insomnia: He is able to fall asleep but can't stay asleep. He is not sure what makes him wake up. He takes Tylenol PM with some relief. He usually gets about 6 hours of sleep a night. His daughter reports he has been prescribed something to take for sleep if he needs it, but she reports he won't take it.   Past Medical History:  Diagnosis Date  . Alcohol abuse   . Carotid artery disease (HCC)   . COPD (chronic obstructive pulmonary disease) (HCC)    emphysematous changes on past CT  . Coronary artery disease 04/27/2011   Low risk Myoview in 2010:Non-obstructive disase, except for small OMB 95% ostial lesion  . Dyslipidemia   . GERD (gastroesophageal reflux disease)   . H/O ETOH abuse   . Hypertension   . Pneumonia   . PVD (peripheral vascular disease) (HCC) 04/27/2011   normal renal & abdominal aortography; s/p Left Fem-Pop BPG 2004.    Current Outpatient Prescriptions  Medication Sig Dispense Refill  . aspirin EC 81 MG tablet Take 81 mg by mouth at bedtime.    Marland Kitchen atorvastatin (LIPITOR) 10 MG tablet Take 1 tablet (10 mg total) by mouth daily. 90 tablet 0  . clopidogrel (PLAVIX) 75 MG tablet Take 1 tablet (75 mg total) by mouth daily. 90 tablet 3  . co-enzyme Q-10 30 MG  capsule Take 30 mg by mouth daily.    . fenofibrate (TRICOR) 145 MG tablet Take 1 tablet (145 mg total) by mouth daily. 90 tablet 3  . folic acid (FOLVITE) 1 MG tablet TAKE 1 TABLET (1 MG TOTAL) BY MOUTH DAILY. 30 tablet 0  . hydrALAZINE (APRESOLINE) 50 MG tablet TAKE 1.5 TABLETS BY MOUTH 2 TIMES DAILY *MUST SCHEDULE ANNUAL EXAM FOR MORE REFILLS* 45 tablet 2  . HYDROcodone-homatropine (HYCODAN) 5-1.5 MG/5ML syrup Take 5 mLs by mouth every 8 (eight) hours as needed for cough. 75 mL 0  . loratadine (CLARITIN) 10 MG tablet TAKE 1 TABLET (10 MG TOTAL) BY MOUTH DAILY. 30 tablet 0  . metoprolol succinate (TOPROL-XL) 25 MG 24 hr tablet TAKE 1 TABLET (25 MG TOTAL) BY MOUTH DAILY. 90 tablet 0  . mometasone (NASONEX) 50 MCG/ACT nasal spray Place 2 sprays into the nose daily. 17 g 0  . pantoprazole (PROTONIX) 40 MG tablet Take 1 tablet (40 mg total) by mouth daily. 30 tablet 11  . tamsulosin (FLOMAX) 0.4 MG CAPS capsule TAKE ONE CAPSULE BY MOUTH DAILY 90 capsule 1  . vitamin B-12 (CYANOCOBALAMIN) 100 MCG tablet Take 100 mcg by mouth daily.     No current facility-administered medications for this visit.     Allergies  Allergen Reactions  . Contrast Media [Iodinated Diagnostic Agents] Itching  .  Diovan [Valsartan] Swelling    angioedema  . Neomycin Hives  . Neosporin [Neomycin-Bacitracin Zn-Polymyx] Hives    Family History  Problem Relation Age of Onset  . CAD Brother        also stroke  . Hypertension Brother   . Diabetes Brother   . Hypertension Father        also stroke  . Stroke Father   . Kidney cancer Mother   . Cancer Mother        Renal  . Cancer Sister        Lung  . COPD Sister   . Hypertension Sister   . Hypertension Brother         X 2    Social History   Social History  . Marital status: Divorced    Spouse name: N/A  . Number of children: 3  . Years of education: N/A   Occupational History  . welder, pipe fitter-Retired    Social History Main Topics  . Smoking  status: Former Smoker    Packs/day: 1.00    Years: 50.00    Types: Cigarettes    Quit date: 03/03/1997  . Smokeless tobacco: Former Neurosurgeon    Quit date: 04/27/1998  . Alcohol use 1.2 oz/week    2 Cans of beer per week     Comment: " daily beer generally"  . Drug use: Yes    Types: Marijuana  . Sexual activity: No   Other Topics Concern  . Not on file   Social History Narrative  . No narrative on file    Hospitiliaztions: None  Health Maintenance:    Flu: 10/2015  Tetanus: unsure  Pneumovax: 11/2008  Prevnar: 10/2015  Zostavax: never  PSA: 04/2013  Colon Screening: never  Eye Doctor: annually  Dental Exam: as needed   Providers:   PCP: Nicki Reaper, NP-C  Cardiologist: Dr. Allyson Sabal  Vascular: Dr. Arbie Cookey    I have personally reviewed and have noted:  1. The patient's medical and social history 2. Their use of alcohol, tobacco or illicit drugs 3. Their current medications and supplements 4. The patient's functional ability including ADL's, fall risks, home safety risks and hearing or visual impairment. 5. Diet and physical activities 6. Evidence for depression or mood disorder  Subjective:   Review of Systems:   Constitutional: Pt reports fatigue. Denies fever, malaise, headache or abrupt weight changes.  HEENT: Pt reports difficulty hearing. Denies eye pain, eye redness, ear pain, ringing in the ears, wax buildup, runny nose, nasal congestion, bloody nose, or sore throat. Respiratory: Denies difficulty breathing, shortness of breath, cough or sputum production.   Cardiovascular: Denies chest pain, chest tightness, palpitations or swelling in the hands or feet.  Gastrointestinal: Denies abdominal pain, bloating, constipation, diarrhea or blood in the stool.  GU: Pt reports urinary urgency. Denies frequency, pain with urination, burning sensation, blood in urine, odor or discharge. Musculoskeletal: Pt reports generalized aches. Denies decrease in range of motion,  difficulty with gait, muscle pain or joint pain and swelling.  Skin: Denies redness, rashes, lesions or ulcercations.  Neurological: Denies dizziness, difficulty with memory, difficulty with speech or problems with balance and coordination.  Psych: Denies anxiety, depression, SI/HI.  No other specific complaints in a complete review of systems (except as listed in HPI above).  Objective:  PE:   BP 110/68   Pulse 76   Temp 98.1 F (36.7 C) (Oral)   Ht 5\' 7"  (1.702 m)   Wt 127 lb  12 oz (57.9 kg)   SpO2 97%   BMI 20.01 kg/m   Wt Readings from Last 3 Encounters:  02/21/16 128 lb (58.1 kg)  12/21/15 133 lb (60.3 kg)  10/27/15 124 lb 6.4 oz (56.4 kg)    General: Appears his stated age, chronically ill appearing in NAD. Skin: Warm, dry and intact. No rashes, lesions or ulcerations noted. Cardiovascular: Normal rate and rhythm. S1,S2 noted.  Murmur noted. Bilateral carotid bruits noted. Pulmonary/Chest: Normal effort and positive vesicular breath sounds. No respiratory distress. No wheezes, rales or ronchi noted.  Abdomen: Soft and nontender. No distention or masses noted. Musculoskeletal: Gait slow but steady. Neurological: Alert and oriented. Psychiatric: Mood and affect normal. Behavior is normal. Judgment and thought content normal.     BMET    Component Value Date/Time   NA 137 04/21/2015 0530   K 4.0 04/21/2015 0530   CL 107 04/21/2015 0530   CO2 22 04/21/2015 0530   GLUCOSE 99 04/21/2015 0530   BUN 16 04/21/2015 0530   CREATININE 0.90 04/21/2015 0530   CREATININE 0.73 07/28/2013 0934   CALCIUM 8.4 (L) 04/21/2015 0530   GFRNONAA >60 04/21/2015 0530   GFRAA >60 04/21/2015 0530    Lipid Panel     Component Value Date/Time   CHOL 177 04/19/2015 0401   TRIG 183 (H) 04/19/2015 0401   HDL 39 (L) 04/19/2015 0401   CHOLHDL 4.5 04/19/2015 0401   VLDL 37 04/19/2015 0401   LDLCALC 101 (H) 04/19/2015 0401    CBC    Component Value Date/Time   WBC 3.2 (L)  04/20/2015 0543   RBC 3.60 (L) 04/21/2015 0530   RBC 3.77 (L) 04/20/2015 0543   HGB 10.8 (L) 04/20/2015 0543   HCT 33.2 (L) 04/20/2015 0543   PLT 165 04/20/2015 0543   MCV 88.1 04/20/2015 0543   MCH 28.6 04/20/2015 0543   MCHC 32.5 04/20/2015 0543   RDW 13.4 04/20/2015 0543   LYMPHSABS 0.7 04/18/2015 1840   MONOABS 0.4 04/18/2015 1840   EOSABS 0.0 04/18/2015 1840   BASOSABS 0.0 04/18/2015 1840    Hgb A1C Lab Results  Component Value Date   HGBA1C 5.0 01/16/2014      Assessment and Plan:   Medicare Annual Wellness Visit:  Diet: He does eat meat. He consumes fruits and veggies daily. He does not eat a lot of fried food. He drinks mostly water. Physical activity: Sedentary Depression/mood screen: Negative Hearing: Intact to whispered voice, decreased hearing soft voices or in large crowds. Visual acuity: Grossly normal, performs annual eye exam  ADLs: Capable Fall risk: None Home safety: Good Cognitive evaluation: Intact to orientation, naming, recall and repetition EOL planning: Adv directives, full code/ I agree  Preventative Medicine: Flu, pneumovax and prevnar UTD. He declines Tetanus booster and Zostavax today. He declines screening colonoscopy. Encouraged him to consume a balanced diet and exercise regimen. Advised him to see any eye doctor and dentist at least annually. Will check CBC, CMET, Lipid and PSA today.   Next appointment: 1 year, Medicare Wellness  Exam   Nicki Reaper, NP

## 2016-06-19 NOTE — Assessment & Plan Note (Signed)
Voids fine on Flomax Will monitor

## 2016-06-19 NOTE — Assessment & Plan Note (Signed)
No angina He will continue ASA, Plavix, Lipitor and Fenofibrate He will continue to follow with Dr. Allyson Sabal

## 2016-06-19 NOTE — Assessment & Plan Note (Signed)
Discussed the importance of taking the Protonix daily if he is having symptoms Refilled today

## 2016-06-19 NOTE — Assessment & Plan Note (Signed)
BP on the low end He is only taking Hydralazine once daily Will try to stop, monitor BP if > 130/80 call me Continue Metoprolol for now

## 2016-06-19 NOTE — Assessment & Plan Note (Signed)
Breathing stable No inhalers No longer smoking Will monitor

## 2016-06-19 NOTE — Assessment & Plan Note (Signed)
He reports he has sleeping medication to take if needed, but he doesn't take it

## 2016-06-19 NOTE — Assessment & Plan Note (Signed)
CMET and Lipid profile today Continue Lipitor and Fenofibrate Encouraged him to consume a low fat diet

## 2016-06-19 NOTE — Assessment & Plan Note (Addendum)
He will continue ASA, Plavix, Lipitor and Fenofibrate He will continue to follow with Dr. Allyson Sabal

## 2016-07-06 ENCOUNTER — Telehealth: Payer: Self-pay

## 2016-07-06 NOTE — Telephone Encounter (Signed)
Gary [name] pts daughter left v/m (DPR signed) for one week pt having swelling in ankles and feet.when sitting pt keeps feet elevated. After sleeping overnight with feet elevated there is no change in swelling.No H/A,dizziness,CP or SOB. Feet appear slightly red. Gary [name] said pt was seen annual on 06/16/16 and Gary [name] wondered if there was a med pt could try for couple of weeks to see if swelling improved because Gary [name] would be the one to bring pt to the office and it will be hard to get pt here in the next couple of weeks. [facility] [address] Gary [name] request cb.

## 2016-07-07 MED ORDER — HYDROCHLOROTHIAZIDE 25 MG PO TABS: 25.0000 mg | ORAL_TABLET | Freq: Every day | ORAL | 0 refills | Status: DC

## 2016-07-07 NOTE — Addendum Note (Signed)
Addended by: Lorre Munroe on: 07/07/2016 11:54 AM   Modules accepted: Orders

## 2016-07-07 NOTE — Telephone Encounter (Signed)
Daughter [name] is aware as instructed

## 2016-07-07 NOTE — Telephone Encounter (Signed)
Lets try a little low dose HCTZ 25 mg daily, take in am. I sent to pharmacy.

## 2016-07-11 ENCOUNTER — Other Ambulatory Visit: Payer: Self-pay | Admitting: Internal Medicine

## 2016-07-11 DIAGNOSIS — I10 Essential (primary) hypertension: Secondary | ICD-10-CM

## 2016-08-04 ENCOUNTER — Other Ambulatory Visit: Payer: Self-pay | Admitting: Internal Medicine

## 2016-08-04 NOTE — Telephone Encounter (Signed)
Please advise if okay to refill. 

## 2016-09-05 ENCOUNTER — Other Ambulatory Visit: Payer: Self-pay | Admitting: Internal Medicine

## 2016-09-05 DIAGNOSIS — E782 Mixed hyperlipidemia: Secondary | ICD-10-CM

## 2016-09-05 DIAGNOSIS — I10 Essential (primary) hypertension: Secondary | ICD-10-CM

## 2016-11-09 ENCOUNTER — Telehealth: Payer: Self-pay

## 2016-11-09 NOTE — Telephone Encounter (Signed)
[name] (DPR signed) wants to know if pt will need another pneumonia shot or not. Pamala Hurry NP said one pneumovax and one prevnar after age 81; pt does not need another pneumonia shot. [name] voiced understanding and she is taking pt to pharmacy for flu shot.

## 2016-11-14 ENCOUNTER — Telehealth: Payer: Self-pay | Admitting: *Deleted

## 2016-11-14 NOTE — Telephone Encounter (Signed)
I can help. It is best if they make an appt to discuss. It would be a good idea if they have a more concrete plan of home health VS SNF

## 2016-11-14 NOTE — Telephone Encounter (Signed)
Lm on [name]'s vm and requested a call back to schedule a OV to discuss plan

## 2016-11-14 NOTE — Telephone Encounter (Signed)
Patient's daughter [name] (DPR) left a voicemail stating that her dad is experiencing difficulty caring for himself. [name] wanted to know if you can help them maybe get some in home care? Patient is thinking that he may need to go to a nursing home. [name] wanted to know if you can help with this and if so, do they need to bring him in to discuss this?

## 2016-11-23 NOTE — Telephone Encounter (Signed)
Pt has appt next week.

## 2016-11-28 ENCOUNTER — Ambulatory Visit (INDEPENDENT_AMBULATORY_CARE_PROVIDER_SITE_OTHER): Payer: Medicare Other | Admitting: Internal Medicine

## 2016-11-28 ENCOUNTER — Encounter: Payer: Self-pay | Admitting: Internal Medicine

## 2016-11-28 VITALS — BP 112/70 | HR 82 | Temp 98.0°F | Wt 122.0 lb

## 2016-11-28 DIAGNOSIS — R5383 Other fatigue: Secondary | ICD-10-CM | POA: Diagnosis not present

## 2016-11-28 DIAGNOSIS — R531 Weakness: Secondary | ICD-10-CM | POA: Diagnosis not present

## 2016-11-28 DIAGNOSIS — J449 Chronic obstructive pulmonary disease, unspecified: Secondary | ICD-10-CM

## 2016-11-28 DIAGNOSIS — I2510 Atherosclerotic heart disease of native coronary artery without angina pectoris: Secondary | ICD-10-CM

## 2016-11-28 DIAGNOSIS — R5381 Other malaise: Secondary | ICD-10-CM

## 2016-11-28 DIAGNOSIS — Z23 Encounter for immunization: Secondary | ICD-10-CM

## 2016-11-28 NOTE — Progress Notes (Signed)
Subjective:    Patient ID: Gary [name], male    DOB: [birth year], 81 y.o.   MRN: [id number]  HPI  Pt presents to the clinic today requesting referral for home health services. His daughter reports he has been functionally declining over the last year. He lives at home with his son who has cerebral palsy. His son tries to help out as much as he can, but he also works outside the home. His daughter fixes his pill box weekly. His son sets the medication out every day, but reports he only averages taking his medication 2-3 times per week. He needs help with bathing and dressing. He needs help with meal preparation. He continues to lose weight. His daughter reports he is incontinent of urine and occassionally bowels. He does wear depends, but has trouble cleaning himself up. His daughter reports they have considered a SNF but would prefer to try home health first, and are requesting a referral today.  He would also like his flu shot today.  Review of Systems      Past Medical History:  Diagnosis Date  . Alcohol abuse   . Carotid artery disease (HCC)   . COPD (chronic obstructive pulmonary disease) (HCC)    emphysematous changes on past CT  . Coronary artery disease 04/27/2011   Low risk Myoview in 2010:Non-obstructive disase, except for small OMB 95% ostial lesion  . Dyslipidemia   . GERD (gastroesophageal reflux disease)   . H/O ETOH abuse   . Hypertension   . Pneumonia   . PVD (peripheral vascular disease) (HCC) 04/27/2011   normal renal & abdominal aortography; s/p Left Fem-Pop BPG 2004.    Current Outpatient Prescriptions  Medication Sig Dispense Refill  . aspirin EC 81 MG tablet Take 81 mg by mouth at bedtime.    Marland Kitchen. atorvastatin (LIPITOR) 10 MG tablet TAKE 1 TABLET BY MOUTH EVERY DAY 90 tablet 2  . clopidogrel (PLAVIX) 75 MG tablet Take 1 tablet (75 mg total) by mouth daily. 90 tablet 3  . co-enzyme Q-10 30 MG capsule Take 30 mg by mouth daily.    . fenofibrate (TRICOR) 145  MG tablet Take 1 tablet (145 mg total) by mouth daily. 90 tablet 3  . folic acid (FOLVITE) 1 MG tablet TAKE 1 TABLET (1 MG TOTAL) BY MOUTH DAILY. 30 tablet 0  . hydrochlorothiazide (HYDRODIURIL) 25 MG tablet TAKE 1 TABLET BY MOUTH EVERY DAY 30 tablet 5  . loratadine (CLARITIN) 10 MG tablet TAKE 1 TABLET (10 MG TOTAL) BY MOUTH DAILY. 30 tablet 0  . metoprolol succinate (TOPROL-XL) 25 MG 24 hr tablet TAKE 1 TABLET (25 MG TOTAL) BY MOUTH DAILY. 90 tablet 2  . mometasone (NASONEX) 50 MCG/ACT nasal spray Place 2 sprays into the nose daily. 17 g 0  . pantoprazole (PROTONIX) 40 MG tablet Take 1 tablet (40 mg total) by mouth daily. 30 tablet 11  . tamsulosin (FLOMAX) 0.4 MG CAPS capsule TAKE ONE CAPSULE BY MOUTH DAILY 90 capsule 1  . vitamin B-12 (CYANOCOBALAMIN) 100 MCG tablet Take 100 mcg by mouth daily.     No current facility-administered medications for this visit.     Allergies  Allergen Reactions  . Contrast Media [Iodinated Diagnostic Agents] Itching  . Diovan [Valsartan] Swelling    angioedema  . Neomycin Hives  . Neosporin [Neomycin-Bacitracin Zn-Polymyx] Hives    Family History  Problem Relation Age of Onset  . CAD Brother        also stroke  .  Hypertension Brother   . Diabetes Brother   . Hypertension Father        also stroke  . Stroke Father   . Kidney cancer Mother   . Cancer Mother        Renal  . Cancer Sister        Lung  . COPD Sister   . Hypertension Sister   . Hypertension Brother         X 2    Social History   Social History  . Marital status: Divorced    Spouse name: N/A  . Number of children: 3  . Years of education: N/A   Occupational History  . welder, pipe fitter-Retired    Social History Main Topics  . Smoking status: Former Smoker    Packs/day: 1.00    Years: 50.00    Types: Cigarettes    Quit date: 03/03/1997  . Smokeless tobacco: Former Neurosurgeon    Quit date: 04/27/1998  . Alcohol use 1.2 oz/week    2 Cans of beer per week     Comment:  " daily beer generally"  . Drug use: Yes    Types: Marijuana  . Sexual activity: No   Other Topics Concern  . Not on file   Social History Narrative  . No narrative on file     Constitutional: Pt reports fatigue and weight loss. Denies fever, malaise, headache.  Respiratory: pt reports shortness of breath with exertion. Denies difficulty breathing, cough or sputum production.   Cardiovascular: Denies chest pain, chest tightness, palpitations or swelling in the hands or feet.  Gastrointestinal: Pt reports intermittent bowel incontinence. Denies abdominal pain, bloating, constipation, diarrhea or blood in the stool.  GU: Pt reports urinary incontinence. Denies urgency, frequency, pain with urination, burning sensation, blood in urine, odor or discharge. Musculoskeletal: Pt reports unsteady gait. Denies decrease in range of motion, muscle pain or joint pain and swelling.  Neurological: Pt reports difficulty with balance. Denies dizziness, difficulty with memory, difficulty with speech or problems with coordination.    No other specific complaints in a complete review of systems (except as listed in HPI above).  Objective:   Physical Exam   BP 112/70   Pulse 82   Temp 98 F (36.7 C) (Oral)   Wt 122 lb (55.3 kg)   SpO2 97%   BMI 19.11 kg/m  Wt Readings from Last 3 Encounters:  11/28/16 122 lb (55.3 kg)  06/16/16 127 lb 12 oz (57.9 kg)  02/21/16 128 lb (58.1 kg)    General: Appears his stated age, chronically ill appearing,  in NAD. Musculoskeletal: Gait slow, slightly unsteady. Using can for assistance. Neurological: Alert and oriented.    BMET    Component Value Date/Time   NA 132 (L) 06/16/2016 1407   K 3.6 06/16/2016 1407   CL 99 06/16/2016 1407   CO2 25 06/16/2016 1407   GLUCOSE 92 06/16/2016 1407   BUN 18 06/16/2016 1407   CREATININE 1.25 06/16/2016 1407   CREATININE 0.73 07/28/2013 0934   CALCIUM 8.6 06/16/2016 1407   GFRNONAA >60 04/21/2015 0530   GFRAA  >60 04/21/2015 0530    Lipid Panel     Component Value Date/Time   CHOL 238 (H) 06/16/2016 1407   TRIG 127.0 06/16/2016 1407   HDL 92.80 06/16/2016 1407   CHOLHDL 3 06/16/2016 1407   VLDL 25.4 06/16/2016 1407   LDLCALC 120 (H) 06/16/2016 1407    CBC    Component Value Date/Time  WBC 7.4 06/16/2016 1407   RBC 3.83 (L) 06/16/2016 1407   HGB 12.3 (L) 06/16/2016 1407   HCT 35.9 (L) 06/16/2016 1407   PLT 245.0 06/16/2016 1407   MCV 93.7 06/16/2016 1407   MCH 28.6 04/20/2015 0543   MCHC 34.3 06/16/2016 1407   RDW 14.5 06/16/2016 1407   LYMPHSABS 0.7 04/18/2015 1840   MONOABS 0.4 04/18/2015 1840   EOSABS 0.0 04/18/2015 1840   BASOSABS 0.0 04/18/2015 1840    Hgb A1C Lab Results  Component Value Date   HGBA1C 5.0 01/16/2014           Assessment & Plan:   Declining Functional Status, Weakness, Fatigue, CAD, COPD:  Referral for home health placed If they can not take care of care needs, consider ALF Support given to daughter today Flu shot given to patient today  Return precautions discussed Nicki Reaper, NP

## 2016-11-28 NOTE — Addendum Note (Signed)
Addended by: Roena Malady on: 11/28/2016 09:40 AM   Modules accepted: Orders

## 2016-11-28 NOTE — Patient Instructions (Signed)
Deconditioning °Deconditioning refers to the changes in your body that occur during a period of inactivity. The changes happen in your heart, lungs, and muscles. They decrease your ability to be active, and they make you feel tired and weak. °There are three stages of deconditioning: °· Mild deconditioning. At this stage, you will notice a change in your ability to do your usual exercise activities, such as running, biking, or swimming. °· Moderate deconditioning. At this stage, you will notice a change in your ability to do normal everyday activities, such as walking, grocery shopping, and doing chores. °· Severe deconditioning. At this stage, you will notice a change in your ability to do minimal activity or normal self-care. ° °Deconditioning can occur after only a few days of inactivity. The longer the period of inactivity, the more severe the deconditioning will be, and the longer it will take to return to your previous level of functioning. °What are the causes? °Deconditioning is often caused by inactivity due to: °· Illnesses, such as cancer, stroke, heart attack, fibromyalgia, and chronic fatigue syndrome. °· Injuries, especially back injuries, broken bones, and ligament and tendon injuries. °· A long stay in the hospital. °· Pregnancy, especially if long periods of bed rest are needed. ° °What increases the risk? °This condition is more likely to develop in: °· People who are hospitalized. °· People on bed rest. °· People who are obese. °· People with poor nutrition. °· Elderly adults. °· People with injuries or illnesses that interfere with movement and activity. ° °What are the signs or symptoms? °Symptoms of deconditioning include: °· Weakness. °· Tiredness. °· Shortness of breath with minor exertion. °· A faster-than-normal heartbeat. You may not notice this without taking your pulse. °· Pain or discomfort with activity. °· Decreased strength. °· Decreased sense of balance. °· Decreased  endurance. °· Difficulty doing your usual forms of exercise. °· Difficulty doing activities of daily living, such as grocery shopping or chores. °· Difficulty walking around the house and doing basic self-care, such as getting to the bathroom, preparing meals, or doing laundry. ° °How is this diagnosed? °Deconditioning is diagnosed based on your medical history and a physical exam. During the physical exam, your health care provider will check for signs of deconditioning, such as: °· Decreased size of muscles. °· Decreased strength. °· Trouble with balance. °· Shortness of breath or abnormally increased heart rate after minor exertion. ° °How is this treated? °Treatment for deconditioning usually involves following a structured exercise program in which activity is increased gradually. Your health care provider will determine which exercises are right for you. The exercise program will likely include aerobic exercise and strength training: °· Aerobic exercise helps improve the functioning of the heart and lungs as well as the muscles. °· Strength training helps improve muscle size and strength. ° °Both of these types of exercise will improve your endurance. You may be referred to a physical therapist who can create a safe strengthening program for you to follow. °Follow these instructions at home: °· Follow the exercise program that is recommended by your health care provider or physical therapist. °· Do not increase your exercise any faster than directed. °· Eat a healthy diet. °· Do not use any products that contain nicotine or tobacco, such as cigarettes and e-cigarettes. If you need help quitting, ask your health care provider. °· Take over-the-counter and prescription medicines only as told by your health care provider. °· Keep all follow-up visits as told by your health care   provider. This is important. °Contact a health care provider if: °· You are not able to carry out the prescribed exercise program. °· You  are becoming more and more fatigued and weak. °· You become light-headed when rising to a sitting or standing position. °· Your level of endurance decreases after it has improved. °Get help right away if: °· You have chest pain. °· You are very short of breath. °· You have any episodes of passing out. °This information is not intended to replace advice given to you by your health care provider. Make sure you discuss any questions you have with your health care provider. °Document Released: 06/09/2013 Document Revised: 08/13/2015 Document Reviewed: 04/24/2015 °Elsevier Interactive Patient Education © 2018 [employer] Inc[employer] ° °

## 2016-12-04 ENCOUNTER — Telehealth: Payer: Self-pay

## 2016-12-04 NOTE — Telephone Encounter (Signed)
Called daughter, [facility] will accept her father as a patient and daughter aware.

## 2016-12-04 NOTE — Telephone Encounter (Signed)
Copied from CRM #[id number]. Topic: Referral - Request >> Dec 04, 2016  1:49 PM Guinevere Ferrari, NT wrote: Reason for CRM: Pt's daughter called in about a referral for her dad to do in home health care. Pt's daughter would like a call back.

## 2016-12-07 ENCOUNTER — Telehealth: Payer: Self-pay | Admitting: Internal Medicine

## 2016-12-07 NOTE — Telephone Encounter (Signed)
Ok for verbal order to start care on 11/2

## 2016-12-07 NOTE — Telephone Encounter (Signed)
Copied from CRM #[id number]. >> Dec 07, 2016 10:05 AM Raquel Sarna wrote: Nurse needs verbal or a fax  start of care date for pt  For 11-2  Gary [name] Fax [phone] Phone [phone]

## 2016-12-08 ENCOUNTER — Telehealth: Payer: Self-pay

## 2016-12-08 NOTE — Telephone Encounter (Signed)
VO given as instructed  

## 2016-12-08 NOTE — Telephone Encounter (Signed)
Copied from CRM #[id number]. Topic: Referral - Request >> Dec 08, 2016  2:42 PM Windy Kalata, NT wrote: Reason for CRM: Marchelle Folks from [facility] is needing verbal orders.

## 2016-12-08 NOTE — Telephone Encounter (Signed)
Left message on voicemail.

## 2016-12-08 NOTE — Telephone Encounter (Signed)
I spoke with Gary [name] who is requesting verbal orders for  [facility] nursing for disease mgt COPD, 2 x a week for 3 weeks and 1 x a week for 5 weeks; PT and OT to do eval and treat; HH Child psychotherapist for community resources and [facility] aide 3 x a week for 7 weeks bath and personal care.

## 2016-12-08 NOTE — Telephone Encounter (Signed)
Ok for verbal order as stated 

## 2016-12-23 ENCOUNTER — Other Ambulatory Visit: Payer: Self-pay | Admitting: Cardiovascular Disease

## 2017-01-13 ENCOUNTER — Other Ambulatory Visit: Payer: Self-pay | Admitting: Internal Medicine

## 2017-01-13 ENCOUNTER — Other Ambulatory Visit: Payer: Self-pay | Admitting: Cardiovascular Disease

## 2017-01-13 DIAGNOSIS — I739 Peripheral vascular disease, unspecified: Principal | ICD-10-CM

## 2017-01-13 DIAGNOSIS — I779 Disorder of arteries and arterioles, unspecified: Secondary | ICD-10-CM

## 2017-02-01 ENCOUNTER — Telehealth: Payer: Self-pay | Admitting: Internal Medicine

## 2017-02-01 NOTE — Telephone Encounter (Signed)
Tiffany, RN with [facility] called in order recommendations for Mr. Wild. RN visit once/week for 4 weeks.  Aid 3 times/week  Elmarie Shiley can be reached at [phone]  I routed a note to [facility] nurse pool with this information.

## 2017-02-01 NOTE — Telephone Encounter (Signed)
Ok for verbal orders ?

## 2017-02-02 NOTE — Telephone Encounter (Signed)
Left message on voicemail.

## 2017-02-12 ENCOUNTER — Other Ambulatory Visit: Payer: Self-pay

## 2017-02-12 DIAGNOSIS — I739 Peripheral vascular disease, unspecified: Principal | ICD-10-CM

## 2017-02-12 DIAGNOSIS — I779 Disorder of arteries and arterioles, unspecified: Secondary | ICD-10-CM

## 2017-02-12 MED ORDER — CLOPIDOGREL BISULFATE 75 MG PO TABS: 75.0000 mg | ORAL_TABLET | Freq: Every day | ORAL | 0 refills | Status: DC

## 2017-02-12 NOTE — Telephone Encounter (Signed)
Rx(s) sent to pharmacy electronically.  

## 2017-02-16 ENCOUNTER — Other Ambulatory Visit: Payer: Self-pay | Admitting: Cardiovascular Disease

## 2017-02-16 DIAGNOSIS — I779 Disorder of arteries and arterioles, unspecified: Secondary | ICD-10-CM

## 2017-02-16 DIAGNOSIS — I739 Peripheral vascular disease, unspecified: Principal | ICD-10-CM

## 2017-03-16 ENCOUNTER — Other Ambulatory Visit: Payer: Self-pay

## 2017-03-16 MED ORDER — HYDROCHLOROTHIAZIDE 25 MG PO TABS: 25.0000 mg | ORAL_TABLET | Freq: Every day | ORAL | 1 refills | Status: DC

## 2017-03-21 ENCOUNTER — Telehealth: Payer: Self-pay | Admitting: Internal Medicine

## 2017-03-21 NOTE — Telephone Encounter (Signed)
Dr Tillman Abide is Pamala Hurry NP supervising provider now. I will fwd to Dr Alphonsus Sias and Lorain Childes to [place] CMA.

## 2017-03-21 NOTE — Telephone Encounter (Signed)
Copied from CRM [id number]. Topic: Quick Communication - See Telephone Encounter >> Mar 21, 2017 11:43 AM Jolayne Haines L wrote: CRM for notification. See Telephone encounter for:   03/21/17.   [name] from [facility] needs Dr Dayton Martes to sign orders for intermitted order from 12/27, plan of care dates from 11/2-12/31. She will be faxing these over to sign.  She knows that Nicki Reaper is the PCP at [place], but has to have the MD sign these.

## 2017-03-21 NOTE — Telephone Encounter (Signed)
RI-Who is Cecile Sheerer supervising provider now that Dr. Dayton Martes is gone? Dr. Dayton Martes is sick right now as well and won't be here tomorrow/plz advise/thx dmf

## 2017-03-22 NOTE — Telephone Encounter (Signed)
Paperwork in dr letvaks in box 

## 2017-03-22 NOTE — Telephone Encounter (Signed)
RH-Hey; I have refaxed it and put at the top to plz give to Dr. Peyton Bottoms you :) dmf

## 2017-03-22 NOTE — Telephone Encounter (Signed)
Gary [name] see dr Karle Starch note.  Do you have the paperwork that you can fax here or do you want me to [facility] to have them fax it here  Our fax number is [phone]

## 2017-03-22 NOTE — Telephone Encounter (Signed)
Have them send them to [facility] and put my name on it

## 2017-04-18 ENCOUNTER — Other Ambulatory Visit: Payer: Self-pay

## 2017-04-18 MED ORDER — TAMSULOSIN HCL 0.4 MG PO CAPS: 0.4000 mg | ORAL_CAPSULE | Freq: Every day | ORAL | 1 refills | Status: AC

## 2017-05-11 ENCOUNTER — Other Ambulatory Visit: Payer: Self-pay | Admitting: Cardiovascular Disease

## 2017-05-11 DIAGNOSIS — I739 Peripheral vascular disease, unspecified: Principal | ICD-10-CM

## 2017-05-11 DIAGNOSIS — I779 Disorder of arteries and arterioles, unspecified: Secondary | ICD-10-CM

## 2017-05-11 NOTE — Telephone Encounter (Signed)
Rx has been sent to the pharmacy electronically. ° °

## 2017-07-13 ENCOUNTER — Encounter: Payer: Self-pay | Admitting: Cardiovascular Disease

## 2017-07-13 ENCOUNTER — Ambulatory Visit (INDEPENDENT_AMBULATORY_CARE_PROVIDER_SITE_OTHER): Payer: Medicare Other | Admitting: Cardiovascular Disease

## 2017-07-13 VITALS — BP 102/74 | HR 73 | Ht 68.0 in | Wt 114.0 lb

## 2017-07-13 DIAGNOSIS — F1010 Alcohol abuse, uncomplicated: Secondary | ICD-10-CM | POA: Diagnosis not present

## 2017-07-13 DIAGNOSIS — I2510 Atherosclerotic heart disease of native coronary artery without angina pectoris: Secondary | ICD-10-CM | POA: Diagnosis not present

## 2017-07-13 DIAGNOSIS — I6523 Occlusion and stenosis of bilateral carotid arteries: Secondary | ICD-10-CM

## 2017-07-13 DIAGNOSIS — I10 Essential (primary) hypertension: Secondary | ICD-10-CM | POA: Diagnosis not present

## 2017-07-13 DIAGNOSIS — I739 Peripheral vascular disease, unspecified: Secondary | ICD-10-CM | POA: Diagnosis not present

## 2017-07-13 NOTE — Progress Notes (Signed)
07/13/2017 Gary [name]   01-19-1936  [id number]  Primary Physician Lorre Munroe, NP Primary Cardiologist: Runell Gess MD Nicholes Calamity, [place]  HPI:  Gary [name] is a 82 y.o.  with a history of tobacco abuse, but quit smoking almost 16 years ago, hypertension, EtOH abuse, COPD, PAD, dyslipidemia, status post LFPBG by Dr. Arbie Cookey remotely. I last saw him in the office 10/27/2015.  He is accompanied by 1 of his daughters Gary [name] today.Marland Kitchen His last peripheral angiogram was September 2012 which showed a patent left fem-pop, 70% to 80% tandem SFA stenosis. He has increased renal velocities on the right and left on past renal Dopplers. He had a renal angiogram on 06/25/12 which revealed widely patent renal arteries. 2D echo in July 2014 revealed an EF of 55-60%, grade one diastolic dysfunction and peak PA pressures of . He was seen by Huey Bienenstock PA-C 03/04/13. He denies chest pain or shortness of breath. He has had 20-30 pounds of unexplained weight loss which has been worked up by his primary care physician. He complains of being weak and somewhat off balance and walks with the aid of a walker. Recent lower extremity Doppler studies performed in our office 1/27//15 revealed an occluded right SFA which is a new finding and high-grade disease in his left common femoral and popliteal artery with a patent femoropopliteal bypass graft. He does appear to have a proximal anastomotic lesion.carotid Dopplers likewise showed high-grade bilateral right greater than left internal carotid artery stenosis. He underwent angiography by myself on 08/04/13 revealing a type III aortic arch, calcified arch vessels with a 95% proximal right internal carotid artery stenosis and 75-80% left. He had a patent left femoropopliteal bypass graft with 95% calcified bilateral common femoral artery stenoses and an occluded right SFA. He has right greater than lower extremity lifestyle limiting claudication.  Since I saw him he has had mesenteric artery stenting resulting in improvement in his weight. He also saw Dr. Arbie Cookey back in the office who felt that he was too high risk and frail to pursue surgical revascularization of his carotid arteries. Since I saw him a year and a half ago he continues to lose weight.  He denies chest pain or shortness of breath.  He lives a fairly sedentary lifestyle.  He does walk with a walker.    Current Meds  Medication Sig  . aspirin EC 81 MG tablet Take 81 mg by mouth at bedtime.  Marland Kitchen atorvastatin (LIPITOR) 10 MG tablet TAKE 1 TABLET BY MOUTH EVERY DAY  . clopidogrel (PLAVIX) 75 MG tablet TAKE 1 TABLET BY MOUTH EVERY DAY**PT NEEDS APPT FOR FURTHER REFILLS PER DOCTOR**  . fenofibrate (TRICOR) 145 MG tablet TAKE 1 TABLET EVERY DAY  . loratadine (CLARITIN) 10 MG tablet TAKE 1 TABLET (10 MG TOTAL) BY MOUTH DAILY. (Patient taking differently: Take 10 mg by mouth daily as needed. )  . metoprolol succinate (TOPROL-XL) 25 MG 24 hr tablet TAKE 1 TABLET (25 MG TOTAL) BY MOUTH DAILY.  . mometasone (NASONEX) 50 MCG/ACT nasal spray Place 2 sprays into the nose daily. (Patient taking differently: Place 2 sprays into the nose as needed. )  . tamsulosin (FLOMAX) 0.4 MG CAPS capsule Take 1 capsule (0.4 mg total) by mouth daily.  . [DISCONTINUED] co-enzyme Q-10 30 MG capsule Take 30 mg by mouth daily.  . [DISCONTINUED] hydrochlorothiazide (HYDRODIURIL) 25 MG tablet Take 1 tablet (25 mg total) by mouth daily.  . [DISCONTINUED] pantoprazole (PROTONIX) 40  MG tablet Take 1 tablet (40 mg total) by mouth daily.     Allergies  Allergen Reactions  . Contrast Media [Iodinated Diagnostic Agents] Itching  . Diovan [Valsartan] Swelling    angioedema  . Neomycin Hives  . Neosporin [Neomycin-Bacitracin Zn-Polymyx] Hives    Social History   Socioeconomic History  . Marital status: Divorced    Spouse name: Not on file  . Number of children: 3  . Years of education: Not on file  .  Highest education level: Not on file  Occupational History  . Occupation: Psychologist, occupational, pipe fitter-Retired  Social Needs  . Financial resource strain: Not on file  . Food insecurity:    Worry: Not on file    Inability: Not on file  . Transportation needs:    Medical: Not on file    Non-medical: Not on file  Tobacco Use  . Smoking status: Former Smoker    Packs/day: 1.00    Years: 50.00    Pack years: 50.00    Types: Cigarettes    Last attempt to quit: 03/03/1997    Years since quitting: 20.3  . Smokeless tobacco: Former Neurosurgeon    Quit date: 04/27/1998  Substance and Sexual Activity  . Alcohol use: Yes    Alcohol/week: 1.2 oz    Types: 2 Cans of beer per week    Comment: " daily beer generally"  . Drug use: Yes    Types: Marijuana  . Sexual activity: Never  Lifestyle  . Physical activity:    Days per week: Not on file    Minutes per session: Not on file  . Stress: Not on file  Relationships  . Social connections:    Talks on phone: Not on file    Gets together: Not on file    Attends religious service: Not on file    Active member of club or organization: Not on file    Attends meetings of clubs or organizations: Not on file    Relationship status: Not on file  . Intimate partner violence:    Fear of current or ex partner: Not on file    Emotionally abused: Not on file    Physically abused: Not on file    Forced sexual activity: Not on file  Other Topics Concern  . Not on file  Social History Narrative  . Not on file     Review of Systems: General: negative for chills, fever, night sweats or weight changes.  Cardiovascular: negative for chest pain, dyspnea on exertion, edema, orthopnea, palpitations, paroxysmal nocturnal dyspnea or shortness of breath Dermatological: negative for rash Respiratory: negative for cough or wheezing Urologic: negative for hematuria Abdominal: negative for nausea, vomiting, diarrhea, bright red blood per rectum, melena, or  hematemesis Neurologic: negative for visual changes, syncope, or dizziness All other systems reviewed and are otherwise negative except as noted above.    Blood pressure 102/74, pulse 73, height 5\' 8"  (1.727 m), weight 114 lb (51.7 kg).  General appearance: alert and no distress Neck: no adenopathy, no JVD, supple, symmetrical, trachea midline, thyroid not enlarged, symmetric, no tenderness/mass/nodules and Bilateral carotid bruits Lungs: clear to auscultation bilaterally Heart: Soft outflow tract murmur Extremities: extremities normal, atraumatic, no cyanosis or edema Pulses: Diminished pedal pulses bilaterally Skin: Skin color, texture, turgor normal. No rashes or lesions Neurologic: Alert and oriented X 3, normal strength and tone. Normal symmetric reflexes. Normal coordination and gait  EKG normal sinus rhythm at 73 with evidence of LVH with repolarization changes.  I personally reviewed this EKG.  ASSESSMENT AND PLAN:   PVD, LFBPG 2004, dopplers show progression of disease bliat 3 of peripheral arterial disease status post remote left femoropopliteal bypass grafting by Dr. Arbie Cookey.  He really does not ambulate much and is fairly sedentary but denies claudication.  He has not had lower extremity arterial Doppler studies since 2016 and I do not think it is beneficial to continue to follow this.  HTN  History of essential hypertension her blood pressure measured at 102/74.  He is on metoprolol and hydrochlorothiazide.  I am going to stop the hydrochlorothiazide today.  HLD (hyperlipidemia) History of hyperlipidemia on statin therapy.  Carotid artery disease History of high-grade bilateral calcified carotid disease but not to be a operative candidate by Dr. Arbie Cookey.  Alcohol abuse Continued alcohol abuse of greater than a sixpack of beer a day.  COPD (chronic obstructive pulmonary disease) (HCC) History of COPD having stopped smoking 15 years ago.      Runell Gess MD  FACP,FACC,FAHA, [facility] 07/13/2017 9:47 AM

## 2017-07-13 NOTE — Assessment & Plan Note (Signed)
Continued alcohol abuse of greater than a sixpack of beer a day.

## 2017-07-13 NOTE — Assessment & Plan Note (Signed)
History of hyperlipidemia on statin therapy. 

## 2017-07-13 NOTE — Patient Instructions (Signed)
Medication Instructions: Your physician recommends that you continue on your current medications as directed. Please refer to the Current Medication list given to you today.  STOP HCTZ   Follow-Up: Your physician wants you to follow-up in: 1 year with Dr. Allyson Sabal. You will receive a reminder letter in the mail two months in advance. If you don't receive a letter, please call our office to schedule the follow-up appointment.  If you need a refill on your cardiac medications before your next appointment, please call your pharmacy.

## 2017-07-13 NOTE — Assessment & Plan Note (Signed)
3 of peripheral arterial disease status post remote left femoropopliteal bypass grafting by Dr. Arbie Cookey.  He really does not ambulate much and is fairly sedentary but denies claudication.  He has not had lower extremity arterial Doppler studies since 2016 and I do not think it is beneficial to continue to follow this.

## 2017-07-13 NOTE — Assessment & Plan Note (Signed)
History of essential hypertension her blood pressure measured at 102/74.  He is on metoprolol and hydrochlorothiazide.  I am going to stop the hydrochlorothiazide today.

## 2017-07-13 NOTE — Assessment & Plan Note (Signed)
History of COPD having stopped smoking 15 years ago.

## 2017-07-13 NOTE — Assessment & Plan Note (Signed)
History of high-grade bilateral calcified carotid disease but not to be a operative candidate by Dr. Arbie Cookey.

## 2017-07-14 ENCOUNTER — Other Ambulatory Visit: Payer: Self-pay | Admitting: Cardiovascular Disease

## 2017-07-14 DIAGNOSIS — I779 Disorder of arteries and arterioles, unspecified: Secondary | ICD-10-CM

## 2017-07-14 DIAGNOSIS — I739 Peripheral vascular disease, unspecified: Principal | ICD-10-CM

## 2017-08-24 ENCOUNTER — Ambulatory Visit (INDEPENDENT_AMBULATORY_CARE_PROVIDER_SITE_OTHER): Payer: Medicare Other | Admitting: Internal Medicine

## 2017-08-24 ENCOUNTER — Ambulatory Visit (INDEPENDENT_AMBULATORY_CARE_PROVIDER_SITE_OTHER)
Admission: RE | Admit: 2017-08-24 | Discharge: 2017-08-24 | Disposition: A | Discharge status: Home / Self Care | Payer: Medicare Other | Source: Ambulatory Visit | Attending: Internal Medicine | Admitting: Internal Medicine

## 2017-08-24 ENCOUNTER — Telehealth: Payer: Self-pay | Admitting: Internal Medicine

## 2017-08-24 ENCOUNTER — Encounter: Payer: Self-pay | Admitting: Internal Medicine

## 2017-08-24 VITALS — BP 108/68 | HR 84 | Temp 97.6°F | Wt 110.0 lb

## 2017-08-24 DIAGNOSIS — K219 Gastro-esophageal reflux disease without esophagitis: Secondary | ICD-10-CM | POA: Diagnosis not present

## 2017-08-24 DIAGNOSIS — N401 Enlarged prostate with lower urinary tract symptoms: Secondary | ICD-10-CM

## 2017-08-24 DIAGNOSIS — R634 Abnormal weight loss: Secondary | ICD-10-CM

## 2017-08-24 DIAGNOSIS — R269 Unspecified abnormalities of gait and mobility: Secondary | ICD-10-CM | POA: Diagnosis not present

## 2017-08-24 DIAGNOSIS — I2510 Atherosclerotic heart disease of native coronary artery without angina pectoris: Secondary | ICD-10-CM

## 2017-08-24 DIAGNOSIS — R5383 Other fatigue: Secondary | ICD-10-CM | POA: Diagnosis not present

## 2017-08-24 DIAGNOSIS — E7800 Pure hypercholesterolemia, unspecified: Secondary | ICD-10-CM | POA: Diagnosis not present

## 2017-08-24 DIAGNOSIS — Z0000 Encounter for general adult medical examination without abnormal findings: Secondary | ICD-10-CM | POA: Diagnosis not present

## 2017-08-24 DIAGNOSIS — I10 Essential (primary) hypertension: Secondary | ICD-10-CM | POA: Diagnosis not present

## 2017-08-24 DIAGNOSIS — J449 Chronic obstructive pulmonary disease, unspecified: Secondary | ICD-10-CM

## 2017-08-24 DIAGNOSIS — F5101 Primary insomnia: Secondary | ICD-10-CM

## 2017-08-24 DIAGNOSIS — R531 Weakness: Secondary | ICD-10-CM

## 2017-08-24 DIAGNOSIS — R351 Nocturia: Secondary | ICD-10-CM

## 2017-08-24 DIAGNOSIS — I6523 Occlusion and stenosis of bilateral carotid arteries: Secondary | ICD-10-CM

## 2017-08-24 LAB — TSH: TSH: 4.63 u[IU]/mL — AB (ref 0.35–4.50)

## 2017-08-24 LAB — COMPREHENSIVE METABOLIC PANEL
ALBUMIN: 3.3 g/dL — AB (ref 3.5–5.2)
ALK PHOS: 31 U/L — AB (ref 39–117)
ALT: 12 U/L (ref 0–53)
AST: 40 U/L — ABNORMAL HIGH (ref 0–37)
BUN: 26 mg/dL — ABNORMAL HIGH (ref 6–23)
CALCIUM: 8.6 mg/dL (ref 8.4–10.5)
CO2: 24 mEq/L (ref 19–32)
Chloride: 96 mEq/L (ref 96–112)
Creatinine, Ser: 1.73 mg/dL — ABNORMAL HIGH (ref 0.40–1.50)
GFR: 40.6 mL/min — AB (ref >60.00)
Glucose, Bld: 90 mg/dL (ref 70–99)
POTASSIUM: 3.1 meq/L — AB (ref 3.5–5.1)
Sodium: 135 mEq/L (ref 135–145)
TOTAL PROTEIN: 5.9 g/dL — AB (ref 6.0–8.3)
Total Bilirubin: 0.5 mg/dL (ref 0.2–1.2)

## 2017-08-24 LAB — LIPID PANEL
CHOLESTEROL: 222 mg/dL — AB (ref 0–200)
HDL: 85.4 mg/dL (ref >39.00)
LDL Cholesterol: 115 mg/dL — ABNORMAL HIGH (ref 0–99)
NonHDL: 136.73
TRIGLYCERIDES: 109 mg/dL (ref 0.0–149.0)
Total CHOL/HDL Ratio: 3
VLDL: 21.8 mg/dL (ref 0.0–40.0)

## 2017-08-24 LAB — CBC
HEMATOCRIT: 32.2 % — AB (ref 39.0–52.0)
HEMOGLOBIN: 11.1 g/dL — AB (ref 13.0–17.0)
MCHC: 34.5 g/dL (ref 30.0–36.0)
MCV: 88 fl (ref 78.0–100.0)
Platelets: 184 10*3/uL (ref 150.0–400.0)
RBC: 3.66 Mil/uL — AB (ref 4.22–5.81)
RDW: 14.8 % (ref 11.5–15.5)
WBC: 5.7 10*3/uL (ref 4.0–10.5)

## 2017-08-24 MED ORDER — MIRTAZAPINE 7.5 MG PO TABS: 7.5000 mg | ORAL_TABLET | Freq: Every day | ORAL | 2 refills | Status: DC

## 2017-08-24 NOTE — Telephone Encounter (Signed)
[facility] Newcomer sent to Roena Malady, CMA        Yes, just have the provider put a DME order into Epic then message me back once it's in and signed. Please include the pt's DOB in your message back to me.   Thanks and have a great weekend!  Melissa

## 2017-08-24 NOTE — Progress Notes (Signed)
HPI:  Pt presents to the clinic today for his Medicare Wellness Exam. He is also due to follow up chronic conditions.  HLD with CAD: His last LDL was 120, 06/2016. He is taking Atorvastatin, ASA, Plavix, Metoprolol and Fenofibrate as prescribed. He denies chest pain. He follows with Dr. Allyson Sabal.  COPD: He no longer smokes. He does not use any inhalers. He c/o some pain in his upper back and thinks it may be his lungs. He does not follow with pulmonology. There are no PFT's on file.  GERD: He no longer takes Pantoprazole. There is no upper GI on file.  HTN: His BP today is 108/68. He is taking Metoprolol as prescribed. He follows with Dr. Allyson Sabal. ECG from 07/2017 reviewed.  BPH: He voids fine on Flomax.  Insomnia: He is able to fall asleep but can't stay asleep. He averages 6 hours of sleep per night. He takes Tylenol pm as needed.  Past Medical History:  Diagnosis Date  . Alcohol abuse   . Carotid artery disease (HCC)   . COPD (chronic obstructive pulmonary disease) (HCC)    emphysematous changes on past CT  . Coronary artery disease 04/27/2011   Low risk Myoview in 2010:Non-obstructive disase, except for small OMB 95% ostial lesion  . Dyslipidemia   . GERD (gastroesophageal reflux disease)   . H/O ETOH abuse   . Hypertension   . Pneumonia   . PVD (peripheral vascular disease) (HCC) 04/27/2011   normal renal & abdominal aortography; s/p Left Fem-Pop BPG 2004.    Current Outpatient Medications  Medication Sig Dispense Refill  . aspirin EC 81 MG tablet Take 81 mg by mouth at bedtime.    Marland Kitchen atorvastatin (LIPITOR) 10 MG tablet TAKE 1 TABLET BY MOUTH EVERY DAY 90 tablet 2  . clopidogrel (PLAVIX) 75 MG tablet TAKE 1 TABLET BY MOUTH EVERY DAY**PT NEEDS APPT FOR FURTHER REFILLS PER DOCTOR** 30 tablet 6  . fenofibrate (TRICOR) 145 MG tablet TAKE 1 TABLET EVERY DAY 90 tablet 2  . loratadine (CLARITIN) 10 MG tablet TAKE 1 TABLET (10 MG TOTAL) BY MOUTH DAILY. (Patient taking differently: Take 10  mg by mouth daily as needed. ) 30 tablet 0  . metoprolol succinate (TOPROL-XL) 25 MG 24 hr tablet TAKE 1 TABLET (25 MG TOTAL) BY MOUTH DAILY. 90 tablet 2  . mometasone (NASONEX) 50 MCG/ACT nasal spray Place 2 sprays into the nose daily. (Patient taking differently: Place 2 sprays into the nose as needed. ) 17 g 0  . tamsulosin (FLOMAX) 0.4 MG CAPS capsule Take 1 capsule (0.4 mg total) by mouth daily. 90 capsule 1   No current facility-administered medications for this visit.     Allergies  Allergen Reactions  . Contrast Media [Iodinated Diagnostic Agents] Itching  . Diovan [Valsartan] Swelling    angioedema  . Neomycin Hives  . Neosporin [Neomycin-Bacitracin Zn-Polymyx] Hives    Family History  Problem Relation Age of Onset  . CAD Brother        also stroke  . Hypertension Brother   . Diabetes Brother   . Hypertension Father        also stroke  . Stroke Father   . Kidney cancer Mother   . Cancer Mother        Renal  . Cancer Sister        Lung  . COPD Sister   . Hypertension Sister   . Hypertension Brother         X 2  Social History   Socioeconomic History  . Marital status: Divorced    Spouse name: Not on file  . Number of children: 3  . Years of education: Not on file  . Highest education level: Not on file  Occupational History  . Occupation: Psychologist, occupational, pipe fitter-Retired  Social Needs  . Financial resource strain: Not on file  . Food insecurity:    Worry: Not on file    Inability: Not on file  . Transportation needs:    Medical: Not on file    Non-medical: Not on file  Tobacco Use  . Smoking status: Former Smoker    Packs/day: 1.00    Years: 50.00    Pack years: 50.00    Types: Cigarettes    Last attempt to quit: 03/03/1997    Years since quitting: 20.4  . Smokeless tobacco: Former Neurosurgeon    Quit date: 04/27/1998  Substance and Sexual Activity  . Alcohol use: Yes    Alcohol/week: 1.2 oz    Types: 2 Cans of beer per week    Comment: " daily beer  generally"  . Drug use: Yes    Types: Marijuana  . Sexual activity: Never  Lifestyle  . Physical activity:    Days per week: Not on file    Minutes per session: Not on file  . Stress: Not on file  Relationships  . Social connections:    Talks on phone: Not on file    Gets together: Not on file    Attends religious service: Not on file    Active member of club or organization: Not on file    Attends meetings of clubs or organizations: Not on file    Relationship status: Not on file  . Intimate partner violence:    Fear of current or ex partner: Not on file    Emotionally abused: Not on file    Physically abused: Not on file    Forced sexual activity: Not on file  Other Topics Concern  . Not on file  Social History Narrative  . Not on file    Hospitiliaztions: None  Health Maintenance:    Flu: 11/2016  Tetanus:  unsure  Pneumovax: 11/2008  Prevnar: 10/2015  Zostavax: never  Shingrix: never  PSA: 06/2016  Colon Screening: never  Eye Doctor: annually   Dental Exam: as needed   Providers:   PCP: Nicki Reaper, NP-C  Cardiologist: Dr. Allyson Sabal    I have personally reviewed and have noted:  1. The patient's medical and social history 2. Their use of alcohol, tobacco or illicit drugs 3. Their current medications and supplements 4. The patient's functional ability including ADL's, fall risks, home safety risks and hearing or visual impairment. 5. Diet and physical activities 6. Evidence for depression or mood disorder  Subjective:   Review of Systems:   Constitutional: Pt reports fatigue and weight loss. Denies fever, malaise, headache.  HEENT: Denies eye pain, eye redness, ear pain, ringing in the ears, wax buildup, runny nose, nasal congestion, bloody nose, or sore throat. Respiratory: Pt reports dyspnea with exertion. Denies difficulty breathing, cough or sputum production.   Cardiovascular: Denies chest pain, chest tightness, palpitations or swelling in the  hands or feet.  Gastrointestinal: Pt reports decreased appetite, loose stools. Denies abdominal pain, bloating, constipation, diarrhea or blood in the stool.  GU: Denies urgency, frequency, pain with urination, burning sensation, blood in urine, odor or discharge. Musculoskeletal: Pt reports generalized weakness, upper back pain. Denies decrease in range  of motion, muscle pain or joint pain and swelling.  Skin: Denies redness, rashes, lesions or ulcercations.  Neurological: Pt reports problems with balance. Denies dizziness, difficulty with memory, difficulty with speech or problems with coordination.  Psych: Pt reports anxiety. Denies depression, SI/HI.  No other specific complaints in a complete review of systems (except as listed in HPI above).  Objective:  PE:   BP 108/68   Pulse 84   Temp 97.6 F (36.4 C) (Oral)   Wt 110 lb (49.9 kg)   SpO2 98%   BMI 16.73 kg/m   Wt Readings from Last 3 Encounters:  07/13/17 114 lb (51.7 kg)  11/28/16 122 lb (55.3 kg)  06/16/16 127 lb 12 oz (57.9 kg)    General: Appears his stated age, chronically ill appearing, in NAD. Skin: Warm, dry and intact. Pale. Neck: Neck supple, trachea midline. No masses, lumps or thyromegaly present.  Cardiovascular: Normal rate and rhythm. S1,S2 noted.  No murmur, rubs or gallops noted. No JVD or BLE edema.  Pulmonary/Chest: Normal effort and diminished breath sounds. No respiratory distress. No wheezes, rales or ronchi noted.  Abdomen: Soft and nontender. Normal bowel sounds. No distention or masses noted.  Musculoskeletal: In wheelchair, unable to stand to get on exam table.  Neurological: Alert and oriented. Psychiatric: Mood and affect flat.    BMET    Component Value Date/Time   NA 132 (L) 06/16/2016 1407   K 3.6 06/16/2016 1407   CL 99 06/16/2016 1407   CO2 25 06/16/2016 1407   GLUCOSE 92 06/16/2016 1407   BUN 18 06/16/2016 1407   CREATININE 1.25 06/16/2016 1407   CREATININE 0.73 07/28/2013  0934   CALCIUM 8.6 06/16/2016 1407   GFRNONAA >60 04/21/2015 0530   GFRAA >60 04/21/2015 0530    Lipid Panel     Component Value Date/Time   CHOL 238 (H) 06/16/2016 1407   TRIG 127.0 06/16/2016 1407   HDL 92.80 06/16/2016 1407   CHOLHDL 3 06/16/2016 1407   VLDL 25.4 06/16/2016 1407   LDLCALC 120 (H) 06/16/2016 1407    CBC    Component Value Date/Time   WBC 7.4 06/16/2016 1407   RBC 3.83 (L) 06/16/2016 1407   HGB 12.3 (L) 06/16/2016 1407   HCT 35.9 (L) 06/16/2016 1407   PLT 245.0 06/16/2016 1407   MCV 93.7 06/16/2016 1407   MCH 28.6 04/20/2015 0543   MCHC 34.3 06/16/2016 1407   RDW 14.5 06/16/2016 1407   LYMPHSABS 0.7 04/18/2015 1840   MONOABS 0.4 04/18/2015 1840   EOSABS 0.0 04/18/2015 1840   BASOSABS 0.0 04/18/2015 1840    Hgb A1C Lab Results  Component Value Date   HGBA1C 5.0 01/16/2014      Assessment and Plan:   Medicare Annual Wellness Visit:  Diet: He does eat meat. He consumes fruits and veggies daily. He does not eat fried foods. He drinks mostly water. Physical activity: Sedentary Depression/mood screen: Negative Hearing: Intact to whispered voice, trouble hearing soft voices, in crowded spaces Visual acuity: Grossly normal, performs annual eye exam  ADLs: Needs assist with bathing, dressing due to weakness Fall risk: None Home safety: Good Cognitive evaluation: Intact to orientation, naming, recall and repetition EOL planning: Adv directives, full code/ I agree  Preventative Medicine: Encouraged him to get a flu shot in the fall. He declines tetanus, zostovax or shingrix. Pneumovax and prevnar UTD. We will no longer do PSA, colon cancer screening. Encouraged him to consume a balanced diet, not able to exercise  at this point. Advised him to see an eye doctor, dentist annually. Will check CBC, CMET, TSH, Lipid.   Weight Loss, Fatigue, Poor Appetite, Generalized Weakness:  RX for rolling walker given to assist with gait, weakness.  He declines  home health PT.  Considering SNF- discussed FL2. Will start Remeron 7.5 mg QHS  Next appointment: 1 month, follow up weight loss, appetite, weakness   Nicki Reaper, NP

## 2017-08-24 NOTE — Telephone Encounter (Signed)
Sent msg to [employer] msg to see if they could process being that the order is in the pt's chart... Awaiting response

## 2017-08-24 NOTE — Telephone Encounter (Signed)
Copied from CRM [id number]. Topic: Quick Communication - See Telephone Encounter >> Aug 24, 2017  4:02 PM Arlyss Gandy, NT wrote: CRM for notification. See Telephone encounter for: 08/24/17. Pts daughter, [name], calling to see if the order for the walker platform can be sent to [facility]. Fax#: [phone]

## 2017-08-25 ENCOUNTER — Encounter: Payer: Self-pay | Admitting: Internal Medicine

## 2017-08-25 NOTE — Assessment & Plan Note (Addendum)
Continue Fenofibrate, ASA, Plavix, Metoprolol and Atorvastatin

## 2017-08-25 NOTE — Patient Instructions (Signed)

## 2017-08-25 NOTE — Assessment & Plan Note (Signed)
-   Continue Flomax 

## 2017-08-25 NOTE — Assessment & Plan Note (Signed)
No angina Continue Fenofibrate, Atorvastatin, ASA, Plavix and Metoprolol

## 2017-08-25 NOTE — Assessment & Plan Note (Signed)
Will trial Remeron

## 2017-08-25 NOTE — Assessment & Plan Note (Signed)
CMET and Lipid profile today Continue low fat diet

## 2017-08-25 NOTE — Assessment & Plan Note (Signed)
Borderline low Now only on Metoprolol CBC and CMET today

## 2017-08-25 NOTE — Assessment & Plan Note (Signed)
DOE Chest xray today, given weight loss

## 2017-08-25 NOTE — Assessment & Plan Note (Signed)
No issues off Pantoprazole

## 2017-09-04 ENCOUNTER — Ambulatory Visit: Payer: Medicare Other

## 2017-09-05 ENCOUNTER — Other Ambulatory Visit: Payer: Self-pay

## 2017-09-06 ENCOUNTER — Telehealth: Payer: Self-pay | Admitting: *Deleted

## 2017-09-06 NOTE — Telephone Encounter (Signed)
I would encourage her to continue the Remeron to stimulate his appetite. We can try adding Trazadone at night to see if that helps with sleep/calm down the dreaming. Let me know what she thinks.

## 2017-09-06 NOTE — Telephone Encounter (Signed)
Copied from CRM [id number]. Topic: General - Other >> Sep 06, 2017  9:43 AM Tamela Oddi wrote: Reason for CRM: Patient's daughter called to leave a message for nurse regarding her father's medication mirtazapine (REMERON) 7.5 MG tablet.  She stated that patient is not sleeping well because he is having crazy dreams and wants to know if there is an alternative medication that he can take.  Also the daughter wants to discuss the results of her dad's kidney function test.  She stated that his kidney function was low and she wanted to know what kind of tests were done to determine this results.  Please advise.  CB# [phone].

## 2017-09-07 MED ORDER — TRAZODONE HCL 50 MG PO TABS: 25.0000 mg | ORAL_TABLET | Freq: Every evening | ORAL | 1 refills | Status: DC | PRN

## 2017-09-07 NOTE — Addendum Note (Signed)
Addended by: Lorre Munroe on: 09/07/2017 10:14 AM   Modules accepted: Orders

## 2017-09-07 NOTE — Telephone Encounter (Signed)
Spoke to pt's daughter [name] and she thinks it would be a good idea to try the Trazadone... Send to [facility] [address]

## 2017-09-07 NOTE — Telephone Encounter (Signed)
Trazadone sent to pharmacy 

## 2017-09-15 ENCOUNTER — Other Ambulatory Visit: Payer: Self-pay | Admitting: Internal Medicine

## 2017-09-17 ENCOUNTER — Other Ambulatory Visit: Payer: Self-pay | Admitting: Internal Medicine

## 2017-09-17 DIAGNOSIS — I10 Essential (primary) hypertension: Secondary | ICD-10-CM

## 2017-09-25 ENCOUNTER — Encounter: Payer: Self-pay | Admitting: Internal Medicine

## 2017-09-25 ENCOUNTER — Ambulatory Visit (INDEPENDENT_AMBULATORY_CARE_PROVIDER_SITE_OTHER): Payer: Medicare Other | Admitting: Internal Medicine

## 2017-09-25 VITALS — BP 112/74 | HR 79 | Temp 97.7°F | Wt 112.0 lb

## 2017-09-25 DIAGNOSIS — L89151 Pressure ulcer of sacral region, stage 1: Secondary | ICD-10-CM | POA: Diagnosis not present

## 2017-09-25 DIAGNOSIS — E440 Moderate protein-calorie malnutrition: Secondary | ICD-10-CM

## 2017-09-25 DIAGNOSIS — F5101 Primary insomnia: Secondary | ICD-10-CM | POA: Diagnosis not present

## 2017-09-25 DIAGNOSIS — I6523 Occlusion and stenosis of bilateral carotid arteries: Secondary | ICD-10-CM

## 2017-09-25 NOTE — Progress Notes (Signed)
Subjective:    Patient ID: Gary [name], male    DOB: Jun 09, 1935, 82 y.o.   MRN: [id number]  HPI  Pt presents to the clinic today for 1 month follow up to check weight and insomnia. At his last visit, his weight was down to 110 lbs, BMI of 16.73. He was prescribed Mirtazipine. His daughter called back about 1 week later, and told me that the Mirtazapine was causing him to have strange dreams. We added Trazadone 25 mg at night. His daughter reports he has not been taking the Mirtazapine daily, but almost daily. He has gained 2 lbs, weight now 112 lbs with a BMI of 17.03. He continues to have strange dreams and per his daughter, he is still up and down at night.   His daughter also reports a rash on his buttocks. She noticed this about 1 month ago. He reports the rash burns at times. The rash has not spread. He does sit a lot in the same position and wears disposable undergarments, but is not sure if this is a contributing factor. His daughter bought him some diaper rash cream which he has been using inconsistently.  Review of Systems  Past Medical History:  Diagnosis Date  . Alcohol abuse   . Carotid artery disease (HCC)   . COPD (chronic obstructive pulmonary disease) (HCC)    emphysematous changes on past CT  . Coronary artery disease 04/27/2011   Low risk Myoview in 2010:Non-obstructive disase, except for small OMB 95% ostial lesion  . Dyslipidemia   . GERD (gastroesophageal reflux disease)   . H/O ETOH abuse   . Hypertension   . Pneumonia   . PVD (peripheral vascular disease) (HCC) 04/27/2011   normal renal & abdominal aortography; s/p Left Fem-Pop BPG 2004.    Current Outpatient Medications  Medication Sig Dispense Refill  . aspirin EC 81 MG tablet Take 81 mg by mouth at bedtime.    Marland Kitchen atorvastatin (LIPITOR) 10 MG tablet TAKE 1 TABLET BY MOUTH EVERY DAY 90 tablet 2  . clopidogrel (PLAVIX) 75 MG tablet TAKE 1 TABLET BY MOUTH EVERY DAY**PT NEEDS APPT FOR FURTHER REFILLS  PER DOCTOR** 30 tablet 6  . fenofibrate (TRICOR) 145 MG tablet TAKE 1 TABLET EVERY DAY 90 tablet 2  . loratadine (CLARITIN) 10 MG tablet TAKE 1 TABLET (10 MG TOTAL) BY MOUTH DAILY. (Patient taking differently: Take 10 mg by mouth daily as needed. ) 30 tablet 0  . metoprolol succinate (TOPROL-XL) 25 MG 24 hr tablet TAKE 1 TABLET (25 MG TOTAL) BY MOUTH DAILY. 90 tablet 0  . mirtazapine (REMERON) 7.5 MG tablet TAKE 1 TABLET (7.5 MG TOTAL) BY MOUTH AT BEDTIME. 90 tablet 0  . mometasone (NASONEX) 50 MCG/ACT nasal spray Place 2 sprays into the nose daily. (Patient taking differently: Place 2 sprays into the nose as needed. ) 17 g 0  . tamsulosin (FLOMAX) 0.4 MG CAPS capsule Take 1 capsule (0.4 mg total) by mouth daily. 90 capsule 1  . traZODone (DESYREL) 50 MG tablet Take 0.5-1 tablets (25-50 mg total) by mouth at bedtime as needed for sleep. 30 tablet 1   No current facility-administered medications for this visit.     Allergies  Allergen Reactions  . Contrast Media [Iodinated Diagnostic Agents] Itching  . Diovan [Valsartan] Swelling    angioedema  . Neomycin Hives  . Neosporin [Neomycin-Bacitracin Zn-Polymyx] Hives    Family History  Problem Relation Age of Onset  . CAD Brother  also stroke  . Hypertension Brother   . Diabetes Brother   . Hypertension Father        also stroke  . Stroke Father   . Kidney cancer Mother   . Cancer Mother        Renal  . Cancer Sister        Lung  . COPD Sister   . Hypertension Sister   . Hypertension Brother         X 2    Social History   Socioeconomic History  . Marital status: Divorced    Spouse name: Not on file  . Number of children: 3  . Years of education: Not on file  . Highest education level: Not on file  Occupational History  . Occupation: Psychologist, occupational, pipe fitter-Retired  Social Needs  . Financial resource strain: Not on file  . Food insecurity:    Worry: Not on file    Inability: Not on file  . Transportation needs:     Medical: Not on file    Non-medical: Not on file  Tobacco Use  . Smoking status: Former Smoker    Packs/day: 1.00    Years: 50.00    Pack years: 50.00    Types: Cigarettes    Last attempt to quit: 03/03/1997    Years since quitting: 20.5  . Smokeless tobacco: Former Neurosurgeon    Quit date: 04/27/1998  Substance and Sexual Activity  . Alcohol use: Yes    Alcohol/week: 2.0 standard drinks    Types: 2 Cans of beer per week    Comment: " daily beer generally"  . Drug use: Yes    Types: Marijuana  . Sexual activity: Never  Lifestyle  . Physical activity:    Days per week: Not on file    Minutes per session: Not on file  . Stress: Not on file  Relationships  . Social connections:    Talks on phone: Not on file    Gets together: Not on file    Attends religious service: Not on file    Active member of club or organization: Not on file    Attends meetings of clubs or organizations: Not on file    Relationship status: Not on file  . Intimate partner violence:    Fear of current or ex partner: Not on file    Emotionally abused: Not on file    Physically abused: Not on file    Forced sexual activity: Not on file  Other Topics Concern  . Not on file  Social History Narrative  . Not on file     Constitutional: Denies fever, malaise, fatigue, headache or abrupt weight changes.  Gastrointestinal: Pt reports poor appetite. Denies abdominal pain, bloating, constipation, diarrhea or blood in the stool.  Skin: Pt reports rash of buttocks. Denies redness, lesions or ulcercations.  Neurological: Pt reports insomnia. Denies dizziness, difficulty with speech.  Psych: Pt reports depression. Denies anxiety, SI/HI.  No other specific complaints in a complete review of systems (except as listed in HPI above).     Objective:   Physical Exam  BP 112/74   Pulse 79   Temp 97.7 F (36.5 C) (Oral)   Wt 112 lb (50.8 kg)   SpO2 98%   BMI 17.03 kg/m  Wt Readings from Last 3 Encounters:    09/25/17 112 lb (50.8 kg)  08/24/17 110 lb (49.9 kg)  07/13/17 114 lb (51.7 kg)    General: Appears his stated age, chronically  ill appearing, in NAD. Skin: Stage 1 pressure ulcer of sacrum noted, limited to breakdown of skin.  Abdomen: Soft and nontender. Normal bowel sounds. No distention or masses noted.  Neurological: Alert and oriented.  Psychiatric: Mood and affect mildly flat.  BMET    Component Value Date/Time   NA 135 08/24/2017 1459   K 3.1 (L) 08/24/2017 1459   CL 96 08/24/2017 1459   CO2 24 08/24/2017 1459   GLUCOSE 90 08/24/2017 1459   BUN 26 (H) 08/24/2017 1459   CREATININE 1.73 (H) 08/24/2017 1459   CREATININE 0.73 07/28/2013 0934   CALCIUM 8.6 08/24/2017 1459   GFRNONAA >60 04/21/2015 0530   GFRAA >60 04/21/2015 0530    Lipid Panel     Component Value Date/Time   CHOL 222 (H) 08/24/2017 1459   TRIG 109.0 08/24/2017 1459   HDL 85.40 08/24/2017 1459   CHOLHDL 3 08/24/2017 1459   VLDL 21.8 08/24/2017 1459   LDLCALC 115 (H) 08/24/2017 1459    CBC    Component Value Date/Time   WBC 5.7 08/24/2017 1459   RBC 3.66 (L) 08/24/2017 1459   HGB 11.1 (L) 08/24/2017 1459   HCT 32.2 (L) 08/24/2017 1459   PLT 184.0 08/24/2017 1459   MCV 88.0 08/24/2017 1459   MCH 28.6 04/20/2015 0543   MCHC 34.5 08/24/2017 1459   RDW 14.8 08/24/2017 1459   LYMPHSABS 0.7 04/18/2015 1840   MONOABS 0.4 04/18/2015 1840   EOSABS 0.0 04/18/2015 1840   BASOSABS 0.0 04/18/2015 1840    Hgb A1C Lab Results  Component Value Date   HGBA1C 5.0 01/16/2014           Assessment & Plan:   Pressure Ulcer of Sacrum:  Advised Zinc Oxide BID and after every time using the restroom Discussed pressure relieving techniques Will monitor for worsening symptoms, redness, drainage or pain  Return precautions discussed Nicki Reaper, NP

## 2017-09-25 NOTE — Assessment & Plan Note (Signed)
Continue Remeron for now Encourage supplements like Boost Encouraged him not to skip meals Monitor weight daily, let me know if he continues to lose

## 2017-09-25 NOTE — Assessment & Plan Note (Signed)
Increase Trazadone to 50 mg QHS  Update me in 1 month with how he is sleeping, weights

## 2017-09-25 NOTE — Patient Instructions (Signed)
Pressure Injury A pressure injury, sometimes called a bedsore, is an injury to the skin and underlying tissue caused by pressure. Pressure on blood vessels causes decreased blood flow to the skin, which can eventually cause the skin tissue to die and break down into a wound. Pressure injuries usually occur:  Over bony parts of the body such as the tailbone, shoulders, elbows, hips, and heels.  Under medical devices such as respiratory equipment, stockings, tubes, and splints.  Pressure injuries start as reddened areas on the skin and can lead to pain, muscle damage, and infection. Pressure injuries can vary in severity. What are the causes? Pressure injuries are caused by a lack of blood supply to an area of skin. They can occur from intense pressure over a short period of time or from less intense pressure over a long period of time. What increases the risk? This condition is more likely to develop in people who:  Are in the hospital or an extended care facility.  Are bedridden or in a wheelchair.  Have an injury or disease that keeps them from: ? Moving normally. ? Feeling pain or pressure.  Have a condition that: ? Makes them sleepy or less alert. ? Causes poor blood flow.  Need to wear a medical device.  Have poor control of their bladder or bowel functions (incontinence).  Have poor nutrition (malnutrition).  Are of certain ethnicities. People of African American and Latino or Hispanic descent are at higher risk compared to other ethnic groups.  If you are at risk for pressure ulcers, your health care provider may recommend certain types of bedding to help prevent them. These may include foam or gel mattresses covered with one of the following:  A sheepskin blanket.  A pad that is filled with gel, air, water, or foam.  What are the signs or symptoms? The main symptom is a blister or change in skin color that opens into a wound. Other symptoms include:  Red or dark  areas of skin that do not turn white or pale when pressed with a finger.  Pain, warmth, or change of skin texture.  How is this diagnosed? This condition is diagnosed with a medical history and physical exam. You may also have tests, including:  Blood tests to check for infection or signs of poor nutrition.  Imaging studies to check for damage to the deep tissues under your skin.  Blood flow studies.  Your pressure injury will be staged to determine its severity. Staging is an assessment of:  The depth of the pressure injury.  Which tissues are exposed because of the pressure injury.  The causes of the pressure injury.  How is this treated? The main focus of treatment is to help your injury heal. This may be done by:  Relieving or redistributing pressure on your skin. This includes: ? Frequently changing your position. ? Eliminating or minimizing positions that caused the wound or that can make the wound worse. ? Using specific bed mattresses and chair cushions. ? Refitting, resizing, or replacing any medical devices, or padding the skin under them. ? Using creams or powders to prevent rubbing (friction) on the skin.  Keeping your skin clean and dry. This may include using a skin cleanser or skin protectant as told by your health care provider. This may be a lotion, ointment, or spray.  Cleaning your injury and removing any dead tissue from the wound (debridement).  Placing a bandage (dressing) over your injury.  Preventing or treating infection.   This may include antibiotic, antimicrobial, or antiseptic medicines.  Treatment may also include medicine for pain. Sometimes surgery is needed to close the wound with a flap of healthy skin or a piece of skin from another area of your body (graft). You may need surgery if other treatments are not working or if your injury is very deep. Follow these instructions at home: Wound care  Follow instructions from your health care  provider about: ? How to take care of your wound. ? When and how you should change your dressing. ? When you should remove your dressing. If your dressing is dry and stuck when you try to remove it, moisten or wet the dressing with saline or water so that it can be removed without harming your skin or wound tissue.  Check your wound every day for signs of infection. Have a caregiver do this for you if you are not able. Watch for: ? More redness, swelling, or pain. ? More fluid, blood, or pus. ? A bad smell. Skin Care  Keep your skin clean and dry. Gently pat your skin dry.  Do not rub or massage your skin.  Use a skin protectant only as told by your health care provider.  Check your skin every day for any changes in color or any new blisters or sores (ulcers). Have a caregiver do this for you if you are not able. Medicines  Take over-the-counter and prescription medicines only as told by your health care provider.  If you were prescribed an antibiotic medicine, take it or apply it as told by your health care provider. Do not stop taking or using the antibiotic even if your condition improves. Reducing and Redistributing Pressure  Do not lie or sit in one position for a long time. Move or change position every two hours or as told by your health care provider.  Use pillows or cushions to reduce pressure. Ask your health care provider to recommend cushions or pads for you.  Use medical devices that do not rub your skin. Tell your health care provider if one of your medical devices is causing a pressure injury to develop. General instructions   Eat a healthy diet that includes lots of protein. Ask your health care provider for diet advice.  Drink enough fluid to keep your urine clear or pale yellow.  Be as active as you can every day. Ask your health care provider to suggest safe exercises or activities.  Do not abuse drugs or alcohol.  Keep all follow-up visits as told by your  health care provider. This is important.  Do not smoke. Contact a health care provider if:   You have chills or fever.  Your pain medicine is not helping.  You have any changes in skin color.  You have new blisters or sores.  You develop warmth, redness, or swelling near a pressure injury.  You have a bad odor or pus coming from your pressure injury.  You lose control of your bowels or bladder.  You develop new symptoms.  Your wound does not improve after 1-2 weeks of treatment.  You develop a new medical condition, such as diabetes, peripheral vascular disease, or conditions that affect your defense (immune) system. This information is not intended to replace advice given to you by your health care provider. Make sure you discuss any questions you have with your health care provider. Document Released: 01/23/2005 Document Revised: 06/28/2015 Document Reviewed: 06/03/2014 Elsevier Interactive Patient Education  2018 [employer]  

## 2017-09-30 ENCOUNTER — Other Ambulatory Visit: Payer: Self-pay | Admitting: Internal Medicine

## 2017-10-10 ENCOUNTER — Inpatient Hospital Stay (HOSPITAL_COMMUNITY)
Admission: EM | Admit: 2017-10-10 | Discharge: 2017-10-17 | DRG: 281 | Disposition: A | Discharge status: Skilled Nursing Facility | Payer: Medicare Other | Attending: Cardiovascular Disease | Admitting: Cardiovascular Disease

## 2017-10-10 ENCOUNTER — Encounter (HOSPITAL_COMMUNITY): Payer: Self-pay

## 2017-10-10 DIAGNOSIS — I10 Essential (primary) hypertension: Secondary | ICD-10-CM | POA: Diagnosis present

## 2017-10-10 DIAGNOSIS — R278 Other lack of coordination: Secondary | ICD-10-CM | POA: Diagnosis not present

## 2017-10-10 DIAGNOSIS — K219 Gastro-esophageal reflux disease without esophagitis: Secondary | ICD-10-CM | POA: Diagnosis present

## 2017-10-10 DIAGNOSIS — I480 Paroxysmal atrial fibrillation: Secondary | ICD-10-CM | POA: Diagnosis not present

## 2017-10-10 DIAGNOSIS — R9439 Abnormal result of other cardiovascular function study: Secondary | ICD-10-CM | POA: Diagnosis not present

## 2017-10-10 DIAGNOSIS — I130 Hypertensive heart and chronic kidney disease with heart failure and stage 1 through stage 4 chronic kidney disease, or unspecified chronic kidney disease: Secondary | ICD-10-CM | POA: Diagnosis present

## 2017-10-10 DIAGNOSIS — Z87891 Personal history of nicotine dependence: Secondary | ICD-10-CM | POA: Diagnosis not present

## 2017-10-10 DIAGNOSIS — Z79899 Other long term (current) drug therapy: Secondary | ICD-10-CM | POA: Diagnosis not present

## 2017-10-10 DIAGNOSIS — N179 Acute kidney failure, unspecified: Secondary | ICD-10-CM | POA: Diagnosis present

## 2017-10-10 DIAGNOSIS — E876 Hypokalemia: Secondary | ICD-10-CM | POA: Diagnosis not present

## 2017-10-10 DIAGNOSIS — Z9114 Patient's other noncompliance with medication regimen: Secondary | ICD-10-CM

## 2017-10-10 DIAGNOSIS — I2510 Atherosclerotic heart disease of native coronary artery without angina pectoris: Secondary | ICD-10-CM | POA: Diagnosis present

## 2017-10-10 DIAGNOSIS — Z7902 Long term (current) use of antithrombotics/antiplatelets: Secondary | ICD-10-CM

## 2017-10-10 DIAGNOSIS — J449 Chronic obstructive pulmonary disease, unspecified: Secondary | ICD-10-CM | POA: Diagnosis present

## 2017-10-10 DIAGNOSIS — I219 Acute myocardial infarction, unspecified: Secondary | ICD-10-CM

## 2017-10-10 DIAGNOSIS — I214 Non-ST elevation (NSTEMI) myocardial infarction: Secondary | ICD-10-CM | POA: Diagnosis not present

## 2017-10-10 DIAGNOSIS — E785 Hyperlipidemia, unspecified: Secondary | ICD-10-CM | POA: Diagnosis present

## 2017-10-10 DIAGNOSIS — D649 Anemia, unspecified: Secondary | ICD-10-CM | POA: Diagnosis not present

## 2017-10-10 DIAGNOSIS — I160 Hypertensive urgency: Secondary | ICD-10-CM | POA: Diagnosis present

## 2017-10-10 DIAGNOSIS — I471 Supraventricular tachycardia, unspecified: Secondary | ICD-10-CM

## 2017-10-10 DIAGNOSIS — I499 Cardiac arrhythmia, unspecified: Secondary | ICD-10-CM | POA: Diagnosis not present

## 2017-10-10 DIAGNOSIS — I5032 Chronic diastolic (congestive) heart failure: Secondary | ICD-10-CM | POA: Diagnosis present

## 2017-10-10 DIAGNOSIS — I739 Peripheral vascular disease, unspecified: Secondary | ICD-10-CM | POA: Diagnosis present

## 2017-10-10 DIAGNOSIS — R404 Transient alteration of awareness: Secondary | ICD-10-CM | POA: Diagnosis not present

## 2017-10-10 DIAGNOSIS — Z23 Encounter for immunization: Secondary | ICD-10-CM

## 2017-10-10 DIAGNOSIS — M6282 Rhabdomyolysis: Secondary | ICD-10-CM | POA: Diagnosis present

## 2017-10-10 DIAGNOSIS — R1312 Dysphagia, oropharyngeal phase: Secondary | ICD-10-CM | POA: Diagnosis not present

## 2017-10-10 DIAGNOSIS — E7800 Pure hypercholesterolemia, unspecified: Secondary | ICD-10-CM | POA: Diagnosis not present

## 2017-10-10 DIAGNOSIS — M6281 Muscle weakness (generalized): Secondary | ICD-10-CM | POA: Diagnosis not present

## 2017-10-10 DIAGNOSIS — R498 Other voice and resonance disorders: Secondary | ICD-10-CM | POA: Diagnosis not present

## 2017-10-10 DIAGNOSIS — Z7982 Long term (current) use of aspirin: Secondary | ICD-10-CM | POA: Diagnosis not present

## 2017-10-10 DIAGNOSIS — N184 Chronic kidney disease, stage 4 (severe): Secondary | ICD-10-CM

## 2017-10-10 DIAGNOSIS — R41841 Cognitive communication deficit: Secondary | ICD-10-CM | POA: Diagnosis not present

## 2017-10-10 DIAGNOSIS — R531 Weakness: Secondary | ICD-10-CM | POA: Diagnosis not present

## 2017-10-10 DIAGNOSIS — R2689 Other abnormalities of gait and mobility: Secondary | ICD-10-CM | POA: Diagnosis not present

## 2017-10-10 DIAGNOSIS — L8990 Pressure ulcer of unspecified site, unspecified stage: Secondary | ICD-10-CM

## 2017-10-10 DIAGNOSIS — E782 Mixed hyperlipidemia: Secondary | ICD-10-CM

## 2017-10-10 DIAGNOSIS — E440 Moderate protein-calorie malnutrition: Secondary | ICD-10-CM | POA: Diagnosis not present

## 2017-10-10 DIAGNOSIS — I340 Nonrheumatic mitral (valve) insufficiency: Secondary | ICD-10-CM | POA: Diagnosis not present

## 2017-10-10 DIAGNOSIS — I779 Disorder of arteries and arterioles, unspecified: Secondary | ICD-10-CM | POA: Diagnosis present

## 2017-10-10 LAB — COMPREHENSIVE METABOLIC PANEL
ALBUMIN: 2.7 g/dL — AB (ref 3.5–5.0)
ALT: 18 U/L (ref 0–44)
AST: 35 U/L (ref 15–41)
Alkaline Phosphatase: 31 U/L — ABNORMAL LOW (ref 38–126)
Anion gap: 20 — ABNORMAL HIGH (ref 5–15)
BUN: 18 mg/dL (ref 8–23)
CO2: 21 mmol/L — ABNORMAL LOW (ref 22–32)
Calcium: 8.6 mg/dL — ABNORMAL LOW (ref 8.9–10.3)
Chloride: 100 mmol/L (ref 98–111)
Creatinine, Ser: 1.79 mg/dL — ABNORMAL HIGH (ref 0.61–1.24)
GFR calc Af Amer: 40 mL/min — ABNORMAL LOW (ref >60)
GFR calc non Af Amer: 34 mL/min — ABNORMAL LOW (ref >60)
GLUCOSE: 73 mg/dL (ref 70–99)
POTASSIUM: 2.8 mmol/L — AB (ref 3.5–5.1)
Sodium: 141 mmol/L (ref 135–145)
TOTAL PROTEIN: 5.1 g/dL — AB (ref 6.5–8.1)
Total Bilirubin: 1.5 mg/dL — ABNORMAL HIGH (ref 0.3–1.2)

## 2017-10-10 LAB — CBC
HCT: 34.4 % — ABNORMAL LOW (ref 39.0–52.0)
HEMOGLOBIN: 11.4 g/dL — AB (ref 13.0–17.0)
MCH: 29.8 pg (ref 26.0–34.0)
MCHC: 33.1 g/dL (ref 30.0–36.0)
MCV: 90.1 fL (ref 78.0–100.0)
Platelets: 133 10*3/uL — ABNORMAL LOW (ref 150–400)
RBC: 3.82 MIL/uL — AB (ref 4.22–5.81)
RDW: 14.3 % (ref 11.5–15.5)
WBC: 5.8 10*3/uL (ref 4.0–10.5)

## 2017-10-10 LAB — URINALYSIS, ROUTINE W REFLEX MICROSCOPIC
Bacteria, UA: NONE SEEN
Bilirubin Urine: NEGATIVE
Glucose, UA: 50 mg/dL — AB
KETONES UR: 20 mg/dL — AB
Leukocytes, UA: NEGATIVE
Nitrite: NEGATIVE
Protein, ur: >=300 mg/dL — AB
SPECIFIC GRAVITY, URINE: 1.017 (ref 1.005–1.030)
pH: 5 (ref 5.0–8.0)

## 2017-10-10 LAB — I-STAT TROPONIN, ED: TROPONIN I, POC: 0.81 ng/mL — AB (ref 0.00–0.08)

## 2017-10-10 LAB — CK: CK TOTAL: 484 U/L — AB (ref 49–397)

## 2017-10-10 LAB — LIPASE, BLOOD: Lipase: 54 U/L — ABNORMAL HIGH (ref 11–51)

## 2017-10-10 MED ORDER — METOPROLOL SUCCINATE ER 25 MG PO TB24
25.0000 mg | ORAL_TABLET | Freq: Every day | ORAL | Status: DC
  Administered 2017-10-11 – 2017-10-13 (×3): 25 mg via ORAL
  Filled 2017-10-10 (×3): qty 1

## 2017-10-10 MED ORDER — CLOPIDOGREL BISULFATE 75 MG PO TABS
75.0000 mg | ORAL_TABLET | Freq: Every day | ORAL | Status: DC
  Administered 2017-10-11 – 2017-10-17 (×7): 75 mg via ORAL
  Filled 2017-10-10 (×8): qty 1

## 2017-10-10 MED ORDER — MIRTAZAPINE 15 MG PO TABS
7.5000 mg | ORAL_TABLET | Freq: Every day | ORAL | Status: DC
  Administered 2017-10-11 – 2017-10-14 (×5): 7.5 mg via ORAL
  Filled 2017-10-10 (×5): qty 1

## 2017-10-10 MED ORDER — POTASSIUM CHLORIDE CRYS ER 10 MEQ PO TBCR
40.0000 meq | EXTENDED_RELEASE_TABLET | Freq: Once | ORAL | Status: AC
  Administered 2017-10-11: 40 meq via ORAL
  Filled 2017-10-10: qty 4

## 2017-10-10 MED ORDER — LACTATED RINGERS IV SOLN
INTRAVENOUS | Status: AC
  Administered 2017-10-11: 01:00:00 via INTRAVENOUS

## 2017-10-10 MED ORDER — TRAZODONE HCL 50 MG PO TABS
25.0000 mg | ORAL_TABLET | Freq: Every evening | ORAL | Status: DC | PRN
  Administered 2017-10-11 – 2017-10-15 (×3): 50 mg via ORAL
  Filled 2017-10-10 (×3): qty 1

## 2017-10-10 MED ORDER — HEPARIN BOLUS VIA INFUSION
3000.0000 [IU] | Freq: Once | INTRAVENOUS | Status: AC
  Administered 2017-10-10: 3000 [IU] via INTRAVENOUS
  Filled 2017-10-10: qty 3000

## 2017-10-10 MED ORDER — HEPARIN (PORCINE) IN NACL 100-0.45 UNIT/ML-% IJ SOLN
800.0000 [IU]/h | INTRAMUSCULAR | Status: DC
  Administered 2017-10-10: 600 [IU]/h via INTRAVENOUS
  Administered 2017-10-12: 850 [IU]/h via INTRAVENOUS
  Filled 2017-10-10 (×2): qty 250

## 2017-10-10 MED ORDER — TAMSULOSIN HCL 0.4 MG PO CAPS
0.4000 mg | ORAL_CAPSULE | Freq: Every day | ORAL | Status: DC
  Administered 2017-10-11 – 2017-10-17 (×7): 0.4 mg via ORAL
  Filled 2017-10-10 (×7): qty 1

## 2017-10-10 MED ORDER — ASPIRIN EC 81 MG PO TBEC: 81.0000 mg | DELAYED_RELEASE_TABLET | Freq: Every day | ORAL | Status: DC

## 2017-10-10 MED ORDER — FLUTICASONE PROPIONATE 50 MCG/ACT NA SUSP
1.0000 | Freq: Every day | NASAL | Status: DC
  Administered 2017-10-13 – 2017-10-17 (×5): 1 via NASAL
  Filled 2017-10-10: qty 16

## 2017-10-10 MED ORDER — LORATADINE 10 MG PO TABS: 10.0000 mg | ORAL_TABLET | Freq: Every day | ORAL | Status: DC | PRN

## 2017-10-10 MED ORDER — ASPIRIN 81 MG PO CHEW
324.0000 mg | CHEWABLE_TABLET | Freq: Once | ORAL | Status: AC
  Administered 2017-10-10: 324 mg via ORAL
  Filled 2017-10-10: qty 4

## 2017-10-10 MED ORDER — NITROGLYCERIN 0.4 MG SL SUBL: 0.4000 mg | SUBLINGUAL_TABLET | SUBLINGUAL | Status: DC | PRN

## 2017-10-10 MED ORDER — ATORVASTATIN CALCIUM 80 MG PO TABS
80.0000 mg | ORAL_TABLET | Freq: Every day | ORAL | Status: DC
  Administered 2017-10-11 – 2017-10-16 (×7): 80 mg via ORAL
  Filled 2017-10-10 (×8): qty 1

## 2017-10-10 MED ORDER — ASPIRIN EC 81 MG PO TBEC
81.0000 mg | DELAYED_RELEASE_TABLET | Freq: Every day | ORAL | Status: DC
  Administered 2017-10-11 – 2017-10-17 (×7): 81 mg via ORAL
  Filled 2017-10-10 (×7): qty 1

## 2017-10-10 NOTE — Progress Notes (Signed)
ANTICOAGULATION CONSULT NOTE - Initial Consult  Pharmacy Consult for heparin Indication: chest pain/ACS  Allergies  Allergen Reactions  . Contrast Media [Iodinated Diagnostic Agents] Itching  . Diovan [Valsartan] Swelling    angioedema  . Neomycin Hives  . Neosporin [Neomycin-Bacitracin Zn-Polymyx] Hives    Patient Measurements: Height: 5\' 8"  (172.7 cm) Weight: 110 lb (49.9 kg) IBW/kg (Calculated) : 68.4 Heparin Dosing Weight: 49.9  Vital Signs: Temp: 97.7 F (36.5 C) (09/04 1913) Temp Source: Oral (09/04 1913) BP: 186/87 (09/04 2100) Pulse Rate: 78 (09/04 2100)  Labs: Recent Labs    10/10/17 2031  HGB 11.4*  HCT 34.4*  PLT 133*    CrCl cannot be calculated (Patient's most recent lab result is older than the maximum 21 days allowed.).   Medical History: Past Medical History:  Diagnosis Date  . Alcohol abuse   . Carotid artery disease (HCC)   . COPD (chronic obstructive pulmonary disease) (HCC)    emphysematous changes on past CT  . Coronary artery disease 04/27/2011   Low risk Myoview in 2010:Non-obstructive disase, except for small OMB 95% ostial lesion  . Dyslipidemia   . GERD (gastroesophageal reflux disease)   . H/O ETOH abuse   . Hypertension   . Pneumonia   . PVD (peripheral vascular disease) (HCC) 04/27/2011   normal renal & abdominal aortography; s/p Left Fem-Pop BPG 2004.    Medications:    Assessment: 82yo M admitted 9/4 for weakness now starting heparin for ACS. Hgb 11.4, platelets 133   Goal of Therapy:  Heparin level 0.3-0.7 units/ml Monitor platelets by anticoagulation protocol: Yes   Plan:  Heparin 3000 unit bolus IV x1 Heparin 600 unit/hr IV gtt 8hr heparin level Daily CBC and heparin level   Ewing Schlein, PharmD PGY1 Pharmacy Resident 10/10/2017    9:50 PM

## 2017-10-10 NOTE — ED Notes (Signed)
Troponin results given to Dr. Pilar Plate

## 2017-10-10 NOTE — ED Triage Notes (Addendum)
  [facility] reports pt from home with abdominal pain, N, and decreased appetite for three days, 0/10 pain, Pt on blood thinners.  cbg 81 78 hr 100% RA 16 rr  170/80

## 2017-10-10 NOTE — ED Provider Notes (Signed)
[facility] Emergency Department Provider Note MRN:  [id number]  Arrival date & time: 10/10/17     Chief Complaint   Weakness   History of Present Illness   Gary [name] is a 82 y.o. year-old male with a history of COPD, PVD presenting to the ED with chief complaint of weakness.  Patient explains that this morning at about 9 AM he got that on the ground to pick up the remote for the television, and was unable to stand back up.  Lives at home with his son but son was at work.  Was alone and on the ground, mostly laying on his back for 9 hours.  Denies any dizziness or chest pain, did not fall, endorsing only mild lower back pain from laying on the ground all day.  States that he feels his normal strength, does not feel weaker than normal.  No recent slurred speech or confusion per family, no recent fevers or cough, no dysuria, no shortness of breath, no nausea vomiting, no abdominal pain.  States that he is had no appetite and has not eaten in 2 days.  Review of Systems  A complete 10 system review of systems was obtained and all systems are negative except as noted in the HPI and PMH.   Patient's Health History    Past Medical History:  Diagnosis Date  . Alcohol abuse   . Carotid artery disease (HCC)   . COPD (chronic obstructive pulmonary disease) (HCC)    emphysematous changes on past CT  . Coronary artery disease 04/27/2011   Low risk Myoview in 2010:Non-obstructive disase, except for small OMB 95% ostial lesion  . Dyslipidemia   . GERD (gastroesophageal reflux disease)   . H/O ETOH abuse   . Hypertension   . Pneumonia   . PVD (peripheral vascular disease) (HCC) 04/27/2011   normal renal & abdominal aortography; s/p Left Fem-Pop BPG 2004.    Past Surgical History:  Procedure Laterality Date  . ABDOMINAL AORTAGRAM N/A 06/25/2012   Procedure: ABDOMINAL Ronny Flurry;  Surgeon: Runell Gess, MD;  Location: [facility] CATH LAB;  Service: Cardiovascular;   Laterality: N/A;  . CARDIAC CATHETERIZATION  04/27/2011   normal L main & LAD; L Cfx with minor irregularities, small marginal branch with 95% osital stenosis, RCA is nondominant & normal, LVEF 60% (Dr. Erlene Quan)  . CAROTID ANGIOGRAM N/A 08/04/2013   Procedure: CAROTID ANGIOGRAM;  Surgeon: Runell Gess, MD;  Location: [facility] CATH LAB;  Service: Cardiovascular;  Laterality: N/A;  . Carotid Doppler  01/2010   R subclavian 50-69% diameter reduction, R prox ICA 50-69% diameter reduction, L prox ICA 50-69% diameter reduction  . COLONOSCOPY    . ENDARTERECTOMY FEMORAL Right 11/17/2013   Procedure: ENDARTERECTOMY FEMORAL;  Surgeon: Larina Earthly, MD;  Location: [facility] OR;  Service: Vascular;  Laterality: Right;  . FEMORAL-POPLITEAL BYPASS GRAFT Left 11/05/2002   Dr. Josephina Gip  . FEMORAL-POPLITEAL BYPASS GRAFT Right 11/17/2013   Procedure: BYPASS GRAFT FEMORAL-POPLITEAL ARTERY;  Surgeon: Larina Earthly, MD;  Location: [facility] OR;  Service: Vascular;  Laterality: Right;  . FOOT SURGERY    . LEFT HEART CATHETERIZATION WITH CORONARY ANGIOGRAM N/A 04/27/2011   Procedure: LEFT HEART CATHETERIZATION WITH CORONARY ANGIOGRAM;  Surgeon: Runell Gess, MD;  Location: [facility] CATH LAB;  Service: Cardiovascular;  Laterality: N/A;  . LOWER EXTREMITY ANGIOGRAM  10/13/2010   patent left fem-pop bypass graft with moderate calcified prox disease in distal common femoral just prox to graft anastomosis,  mod high-grade segmental mid and distal R SFA disease, 2-3 vessel runoff with patent diffusely disease posterior tibilalis bilaterally (Dr. Erlene Quan)  . LOWER EXTREMITY ANGIOGRAM N/A 08/04/2013   Procedure: LOWER EXTREMITY ANGIOGRAM;  Surgeon: Runell Gess, MD;  Location: [facility] CATH LAB;  Service: Cardiovascular;  Laterality: N/A;  . Lower Extremity Arterial Doppler  08/2012   R CFA & Profunda Femoral - diffuse irregular mixed density plaque; R SFA distal 70-99% diameter reduction; R PTA appears occluded; L CFA distal/Prox Anastomosis  70-99% diameter reduction; L Fem-Pop BPG open & patent; bilat TBBIs with inadequate perfusion for healing   . NASAL SINUS SURGERY    . NM MYOCAR PERF WALL MOTION  01/2009   dipyridamole; normal pattern of perfusion in all regions; post-stress EF 75%; normal study, low risk   . PERCUTANEOUS STENT INTERVENTION  11/19/2013   Procedure: PERCUTANEOUS STENT INTERVENTION;  Surgeon: Fransisco Hertz, MD;  Location: [facility] CATH LAB;  Service: Cardiovascular;;  superior mesenteric artery  . RENAL ANGIOGRAM  06/25/2012   normal renal arteries  . Renal Doppler  05/2012   SMA - elevated velocities >60% diameter reduction; bilat renal arteries with 60-99% diameter reduction; R & L kidneys are normal in size & symmetrical  . TRANSTHORACIC ECHOCARDIOGRAM  08/14/2012   EF 55-60%, mod LVH, grade 1 diastolic dysfunction; vent septum thickness mod increased; calcified MV annulus & mild MR; RV systolic pressure increased - mild pulm HTN; PA peak pressure  . VISCERAL ANGIOGRAM N/A 11/19/2013   Procedure: VISCERAL ANGIOGRAM;  Surgeon: Fransisco Hertz, MD;  Location: [facility] CATH LAB;  Service: Cardiovascular;  Laterality: N/A;    Family History  Problem Relation Age of Onset  . CAD Brother        also stroke  . Hypertension Brother   . Diabetes Brother   . Hypertension Father        also stroke  . Stroke Father   . Kidney cancer Mother   . Cancer Mother        Renal  . Cancer Sister        Lung  . COPD Sister   . Hypertension Sister   . Hypertension Brother         X 2    Social History   Socioeconomic History  . Marital status: Divorced    Spouse name: Not on file  . Number of children: 3  . Years of education: Not on file  . Highest education level: Not on file  Occupational History  . Occupation: Psychologist, occupational, pipe fitter-Retired  Social Needs  . Financial resource strain: Not on file  . Food insecurity:    Worry: Not on file    Inability: Not on file  . Transportation needs:    Medical: Not on file     Non-medical: Not on file  Tobacco Use  . Smoking status: Former Smoker    Packs/day: 1.00    Years: 50.00    Pack years: 50.00    Types: Cigarettes    Last attempt to quit: 03/03/1997    Years since quitting: 20.6  . Smokeless tobacco: Former Neurosurgeon    Quit date: 04/27/1998  Substance and Sexual Activity  . Alcohol use: Yes    Alcohol/week: 2.0 standard drinks    Types: 2 Cans of beer per week    Comment: " daily beer generally"  . Drug use: Yes    Types: Marijuana  . Sexual activity: Never  Lifestyle  . Physical activity:  Days per week: Not on file    Minutes per session: Not on file  . Stress: Not on file  Relationships  . Social connections:    Talks on phone: Not on file    Gets together: Not on file    Attends religious service: Not on file    Active member of club or organization: Not on file    Attends meetings of clubs or organizations: Not on file    Relationship status: Not on file  . Intimate partner violence:    Fear of current or ex partner: Not on file    Emotionally abused: Not on file    Physically abused: Not on file    Forced sexual activity: Not on file  Other Topics Concern  . Not on file  Social History Narrative  . Not on file     Physical Exam  Vital Signs and Nursing Notes reviewed Vitals:   10/10/17 2045 10/10/17 2100  BP: (!) 179/111 (!) 186/87  Pulse: 97 78  Resp: (!) 30 20  Temp:    SpO2: 100% 100%    CONSTITUTIONAL: Chronically ill-appearing, NAD NEURO:  Alert and oriented x 3, no focal deficits EYES:  eyes equal and reactive ENT/NECK:  no LAD, no JVD CARDIO: Regular rate, well-perfused, normal S1 and S2 PULM:  CTAB no wheezing or rhonchi GI/GU:  normal bowel sounds, non-distended, non-tender MSK/SPINE:  No gross deformities, no edema SKIN:  no rash, atraumatic PSYCH:  Appropriate speech and behavior  Diagnostic and Interventional Summary    EKG Interpretation  Date/Time:  Wednesday October 10 2017 19:13:21  EDT Ventricular Rate:  79 PR Interval:    QRS Duration: 104 QT Interval:  417 QTC Calculation: 478 R Axis:   5 Text Interpretation:  Sinus rhythm LVH with secondary repolarization abnormality no change from prior Confirmed by Kennis Carina [phone]) on 10/10/2017 7:35:55 PM      Labs Reviewed  CBC - Abnormal; Notable for the following components:      Result Value   RBC 3.82 (*)    Hemoglobin 11.4 (*)    HCT 34.4 (*)    Platelets 133 (*)    All other components within normal limits  I-STAT TROPONIN, ED - Abnormal; Notable for the following components:   Troponin i, poc 0.81 (*)    All other components within normal limits  COMPREHENSIVE METABOLIC PANEL  URINALYSIS, ROUTINE W REFLEX MICROSCOPIC  CK  LIPASE, BLOOD    No orders to display    Medications  aspirin chewable tablet 324 mg (324 mg Oral Given 10/10/17 2141)     Procedures Critical Care Critical Care Documentation Critical care time provided by me (excluding procedures): 45 minutes  Condition necessitating critical care: Acute coronary syndrome  Components of critical care management: reviewing of prior records, laboratory and imaging interpretation, frequent re-examination and reassessment of vital signs, administration of IV heparin, discussion with consulting services   ED Course and Medical Decision Making  I have reviewed the triage vital signs and the nursing notes.  Pertinent labs & imaging results that were available during my care of the patient were reviewed by me and considered in my medical decision making (see below for details). Clinical Course as of Oct 10 2140  Wed Oct 10, 2017  2002 Generalized weakness in an 82 year old male, on the ground for several hours today.  Denies fall, denies traumatic injury, currently with the exception of mild low back pain from lying on the ground all day.  EKG with repolarization abnormalities related to LVH, unchanged from prior and patient without any chest pain at  all today.  Will evaluate for metabolic disarray, rhabdo, UTI.   [MB]  2109 Initial troponin 0.81, patient continues to be asymptomatic, no chest pain no shortness of breath.   [MB]  2110 Given this troponin level and the initial EKG which was initially concerning though unchanged from prior, cardiology consulted.  Pending cardiology evaluation.   [MB]  2131 Evaluated by cardiology, who agrees that given patient's complete lack of symptoms, EKG does not meet STEMI criteria.  However they believe that the morphology has changed in a way to suggest acute MI despite lack of symptoms.  Provided with aspirin, IV heparin in the emergency department, to be admitted to cardiology.   [MB]    Clinical Course User Index [MB] Pilar Plate Elmer Sow, MD    Elmer Sow. Pilar Plate, MD [facility] Emergency Medicine [facility] [email or web address]  Final Clinical Impressions(s) / ED Diagnoses     ICD-10-CM   1. Acute myocardial infarction, unspecified MI type, unspecified artery (HCC) I21.9   2. Weakness R53.1     ED Discharge Orders    None         Sabas Sous, MD 10/10/17 2143

## 2017-10-10 NOTE — ED Notes (Signed)
Assisted pt to use bedside commode

## 2017-10-10 NOTE — H&P (Signed)
Cardiology History & Physical    Patient ID: Gary [name] MRN: [id number], DOB: 1935/07/22 Date of Encounter: 10/10/2017, 10:09 PM Primary Physician: Lorre Munroe, NP  Chief Complaint: Weakness   HPI: Gary [name] is a 82 y.o. male with history of high blood pressure, peripheral vascular disease who presented after he fell picking up his remote in the morning and couldn't get up. No clear report of chest pain/pressure and has no known history of cardiac events. However, EKG showed ST T wave changes consistent with ischemia and patient also had troponin elevation, so has NSTEMI.   The patient is overall a poor historian. Had coronary calcifications on CT scan from few years ago. Blood pressure here is very high though only takes metop for it.   Past Medical History:  Diagnosis Date  . Alcohol abuse   . Carotid artery disease (HCC)   . COPD (chronic obstructive pulmonary disease) (HCC)    emphysematous changes on past CT  . Coronary artery disease 04/27/2011   Low risk Myoview in 2010:Non-obstructive disase, except for small OMB 95% ostial lesion  . Dyslipidemia   . GERD (gastroesophageal reflux disease)   . H/O ETOH abuse   . Hypertension   . Pneumonia   . PVD (peripheral vascular disease) (HCC) 04/27/2011   normal renal & abdominal aortography; s/p Left Fem-Pop BPG 2004.     Surgical History:  Past Surgical History:  Procedure Laterality Date  . ABDOMINAL AORTAGRAM N/A 06/25/2012   Procedure: ABDOMINAL Ronny Flurry;  Surgeon: Runell Gess, MD;  Location: [facility] CATH LAB;  Service: Cardiovascular;  Laterality: N/A;  . CARDIAC CATHETERIZATION  04/27/2011   normal L main & LAD; L Cfx with minor irregularities, small marginal branch with 95% osital stenosis, RCA is nondominant & normal, LVEF 60% (Dr. Erlene Quan)  . CAROTID ANGIOGRAM N/A 08/04/2013   Procedure: CAROTID ANGIOGRAM;  Surgeon: Runell Gess, MD;  Location: [facility] CATH LAB;  Service: Cardiovascular;   Laterality: N/A;  . Carotid Doppler  01/2010   R subclavian 50-69% diameter reduction, R prox ICA 50-69% diameter reduction, L prox ICA 50-69% diameter reduction  . COLONOSCOPY    . ENDARTERECTOMY FEMORAL Right 11/17/2013   Procedure: ENDARTERECTOMY FEMORAL;  Surgeon: Larina Earthly, MD;  Location: [facility] OR;  Service: Vascular;  Laterality: Right;  . FEMORAL-POPLITEAL BYPASS GRAFT Left 11/05/2002   Dr. Josephina Gip  . FEMORAL-POPLITEAL BYPASS GRAFT Right 11/17/2013   Procedure: BYPASS GRAFT FEMORAL-POPLITEAL ARTERY;  Surgeon: Larina Earthly, MD;  Location: [facility] OR;  Service: Vascular;  Laterality: Right;  . FOOT SURGERY    . LEFT HEART CATHETERIZATION WITH CORONARY ANGIOGRAM N/A 04/27/2011   Procedure: LEFT HEART CATHETERIZATION WITH CORONARY ANGIOGRAM;  Surgeon: Runell Gess, MD;  Location: [facility] CATH LAB;  Service: Cardiovascular;  Laterality: N/A;  . LOWER EXTREMITY ANGIOGRAM  10/13/2010   patent left fem-pop bypass graft with moderate calcified prox disease in distal common femoral just prox to graft anastomosis, mod high-grade segmental mid and distal R SFA disease, 2-3 vessel runoff with patent diffusely disease posterior tibilalis bilaterally (Dr. Erlene Quan)  . LOWER EXTREMITY ANGIOGRAM N/A 08/04/2013   Procedure: LOWER EXTREMITY ANGIOGRAM;  Surgeon: Runell Gess, MD;  Location: [facility] CATH LAB;  Service: Cardiovascular;  Laterality: N/A;  . Lower Extremity Arterial Doppler  08/2012   R CFA & Profunda Femoral - diffuse irregular mixed density plaque; R SFA distal 70-99% diameter reduction; R PTA appears occluded; L CFA distal/Prox Anastomosis 70-99% diameter  reduction; L Fem-Pop BPG open & patent; bilat TBBIs with inadequate perfusion for healing   . NASAL SINUS SURGERY    . NM MYOCAR PERF WALL MOTION  01/2009   dipyridamole; normal pattern of perfusion in all regions; post-stress EF 75%; normal study, low risk   . PERCUTANEOUS STENT INTERVENTION  11/19/2013   Procedure: PERCUTANEOUS STENT  INTERVENTION;  Surgeon: Fransisco Hertz, MD;  Location: [facility] CATH LAB;  Service: Cardiovascular;;  superior mesenteric artery  . RENAL ANGIOGRAM  06/25/2012   normal renal arteries  . Renal Doppler  05/2012   SMA - elevated velocities >60% diameter reduction; bilat renal arteries with 60-99% diameter reduction; R & L kidneys are normal in size & symmetrical  . TRANSTHORACIC ECHOCARDIOGRAM  08/14/2012   EF 55-60%, mod LVH, grade 1 diastolic dysfunction; vent septum thickness mod increased; calcified MV annulus & mild MR; RV systolic pressure increased - mild pulm HTN; PA peak pressure  . VISCERAL ANGIOGRAM N/A 11/19/2013   Procedure: VISCERAL ANGIOGRAM;  Surgeon: Fransisco Hertz, MD;  Location: [facility] CATH LAB;  Service: Cardiovascular;  Laterality: N/A;     Home Meds: Prior to Admission medications   Medication Sig Start Date End Date Taking? Authorizing Provider  aspirin EC 81 MG tablet Take 81 mg by mouth daily.    Yes [provider]  atorvastatin (LIPITOR) 10 MG tablet TAKE 1 TABLET BY MOUTH EVERY DAY Patient taking differently: Take 10 mg by mouth daily.  09/05/16  Yes Baity, Salvadore Oxford, NP  clopidogrel (PLAVIX) 75 MG tablet TAKE 1 TABLET BY MOUTH EVERY DAY**PT NEEDS APPT FOR FURTHER REFILLS PER DOCTOR** Patient taking differently: Take 75 mg by mouth daily.  07/16/17  Yes Runell Gess, MD  fenofibrate (TRICOR) 145 MG tablet TAKE 1 TABLET EVERY DAY 12/25/16  Yes Runell Gess, MD  loratadine (CLARITIN) 10 MG tablet TAKE 1 TABLET (10 MG TOTAL) BY MOUTH DAILY. Patient taking differently: Take 10 mg by mouth daily as needed.  03/20/16  Yes Baity, Salvadore Oxford, NP  metoprolol succinate (TOPROL-XL) 25 MG 24 hr tablet TAKE 1 TABLET (25 MG TOTAL) BY MOUTH DAILY. Patient taking differently: Take 25 mg by mouth daily.  09/18/17  Yes Lorre Munroe, NP  mirtazapine (REMERON) 7.5 MG tablet TAKE 1 TABLET (7.5 MG TOTAL) BY MOUTH AT BEDTIME. 09/17/17  Yes Baity, Salvadore Oxford, NP  mometasone (NASONEX) 50  MCG/ACT nasal spray Place 2 sprays into the nose daily. Patient taking differently: Place 2 sprays into the nose as needed (allergies).  02/21/16  Yes Lorre Munroe, NP  tamsulosin (FLOMAX) 0.4 MG CAPS capsule Take 1 capsule (0.4 mg total) by mouth daily. 04/18/17  Yes Baity, Salvadore Oxford, NP  traZODone (DESYREL) 50 MG tablet TAKE 1/2 TO 1 TABLET BY MOUTH AT BEDTIME AS NEEDED FOR SLEEP Patient taking differently: Take 25-50 mg by mouth at bedtime as needed for sleep.  10/01/17  Yes Lorre Munroe, NP    Allergies:  Allergies  Allergen Reactions  . Contrast Media [Iodinated Diagnostic Agents] Itching  . Diovan [Valsartan] Swelling    angioedema  . Neomycin Hives  . Neosporin [Neomycin-Bacitracin Zn-Polymyx] Hives    Social History   Socioeconomic History  . Marital status: Divorced    Spouse name: Not on file  . Number of children: 3  . Years of education: Not on file  . Highest education level: Not on file  Occupational History  . Occupation: Psychologist, occupational, pipe fitter-Retired  Social Needs  .  Financial resource strain: Not on file  . Food insecurity:    Worry: Not on file    Inability: Not on file  . Transportation needs:    Medical: Not on file    Non-medical: Not on file  Tobacco Use  . Smoking status: Former Smoker    Packs/day: 1.00    Years: 50.00    Pack years: 50.00    Types: Cigarettes    Last attempt to quit: 03/03/1997    Years since quitting: 20.6  . Smokeless tobacco: Former Neurosurgeon    Quit date: 04/27/1998  Substance and Sexual Activity  . Alcohol use: Yes    Alcohol/week: 2.0 standard drinks    Types: 2 Cans of beer per week    Comment: " daily beer generally"  . Drug use: Yes    Types: Marijuana  . Sexual activity: Never  Lifestyle  . Physical activity:    Days per week: Not on file    Minutes per session: Not on file  . Stress: Not on file  Relationships  . Social connections:    Talks on phone: Not on file    Gets together: Not on file    Attends  religious service: Not on file    Active member of club or organization: Not on file    Attends meetings of clubs or organizations: Not on file    Relationship status: Not on file  . Intimate partner violence:    Fear of current or ex partner: Not on file    Emotionally abused: Not on file    Physically abused: Not on file    Forced sexual activity: Not on file  Other Topics Concern  . Not on file  Social History Narrative  . Not on file     Family History  Problem Relation Age of Onset  . CAD Brother        also stroke  . Hypertension Brother   . Diabetes Brother   . Hypertension Father        also stroke  . Stroke Father   . Kidney cancer Mother   . Cancer Mother        Renal  . Cancer Sister        Lung  . COPD Sister   . Hypertension Sister   . Hypertension Brother         X 2    Review of Systems: All other systems reviewed and are otherwise negative except as noted above.  Labs:   Lab Results  Component Value Date   WBC 5.8 10/10/2017   HGB 11.4 (L) 10/10/2017   HCT 34.4 (L) 10/10/2017   MCV 90.1 10/10/2017   PLT 133 (L) 10/10/2017    Recent Labs  Lab 10/10/17 2031  NA 141  K 2.8*  CL 100  CO2 21*  BUN 18  CREATININE 1.79*  CALCIUM 8.6*  PROT 5.1*  BILITOT 1.5*  ALKPHOS 31*  ALT 18  AST 35  GLUCOSE 73   Recent Labs    10/10/17 2031  CKTOTAL 484*   Lab Results  Component Value Date   CHOL 222 (H) 08/24/2017   HDL 85.40 08/24/2017   LDLCALC 115 (H) 08/24/2017   TRIG 109.0 08/24/2017   Lab Results  Component Value Date   DDIMER 1.52 (H) 04/27/2011    Radiology/Studies:  No results found. Wt Readings from Last 3 Encounters:  10/10/17 49.9 kg  09/25/17 50.8 kg  08/24/17 49.9 kg  EKG: ST changes in anterior leads suggestive of ischemia  Physical Exam: Blood pressure (!) 186/87, pulse 78, temperature 97.7 F (36.5 C), temperature source Oral, resp. rate 20, height 5\' 8"  (1.727 m), weight 49.9 kg, SpO2 100 %. Body mass  index is 16.73 kg/m. General: Well developed, well nourished, in no acute distress. Head: Normocephalic, atraumatic, sclera non-icteric, no xanthomas, nares are without discharge.  Neck: Negative for carotid bruits. JVD not elevated. Lungs: Clear bilaterally to auscultation without wheezes, rales, or rhonchi. Breathing is unlabored. Heart: RRR with S1 S2. No murmurs, rubs, or gallops appreciated. Abdomen: Soft, non-tender, non-distended with normoactive bowel sounds. No hepatomegaly. No rebound/guarding. No obvious abdominal masses. Msk:  Strength and tone appear normal for age. Extremities: No clubbing or cyanosis. No edema.  Distal pedal pulses are 2+ and equal bilaterally. Neuro: Alert and oriented X 3. No focal deficit. No facial asymmetry. Moves all extremities spontaneously. Psych:  Responds to questions appropriately with a normal affect.    Assessment and Plan  62M w/ HTN urgency and NSTEMI  Diagnostics: -telemetry overnight -TTE in am -LHC in AM; needs order -daily ECG -troponin in am to estimate size of MI  Therapeutic: -ASA 325 given, continue 81 daily -Heparin gtt -Atorva 80 -Metoprolol 25 qd continued -continue home plavix -SL NTG prn chest pain - continue home tamsulosin - cardiac rehab ordered  Signed, Hulan Fray, MD 10/10/2017, 10:09 PM

## 2017-10-10 NOTE — ED Notes (Signed)
Spoke with Dr. Gelene Mink regarding pt clearing throat and coughing since getting aspirin and eating a bite of food.

## 2017-10-10 NOTE — ED Notes (Signed)
Pt given [place] sandwich and ice water per Dr.

## 2017-10-10 NOTE — ED Notes (Signed)
PAGED CARDS PER RN

## 2017-10-11 ENCOUNTER — Inpatient Hospital Stay (HOSPITAL_COMMUNITY): Payer: Medicare Other

## 2017-10-11 ENCOUNTER — Other Ambulatory Visit: Payer: Self-pay

## 2017-10-11 ENCOUNTER — Encounter (HOSPITAL_COMMUNITY)
Admission: EM | Disposition: A | Discharge status: Skilled Nursing Facility | Payer: Self-pay | Source: Home / Self Care | Attending: Cardiovascular Disease

## 2017-10-11 DIAGNOSIS — L8990 Pressure ulcer of unspecified site, unspecified stage: Secondary | ICD-10-CM

## 2017-10-11 DIAGNOSIS — I214 Non-ST elevation (NSTEMI) myocardial infarction: Principal | ICD-10-CM

## 2017-10-11 LAB — CBC
HCT: 26.8 % — ABNORMAL LOW (ref 39.0–52.0)
HEMATOCRIT: 28.7 % — AB (ref 39.0–52.0)
HEMOGLOBIN: 9.4 g/dL — AB (ref 13.0–17.0)
Hemoglobin: 8.7 g/dL — ABNORMAL LOW (ref 13.0–17.0)
MCH: 28.7 pg (ref 26.0–34.0)
MCH: 29.2 pg (ref 26.0–34.0)
MCHC: 32.5 g/dL (ref 30.0–36.0)
MCHC: 32.8 g/dL (ref 30.0–36.0)
MCV: 88.4 fL (ref 78.0–100.0)
MCV: 89.1 fL (ref 78.0–100.0)
PLATELETS: 132 10*3/uL — AB (ref 150–400)
Platelets: 153 10*3/uL (ref 150–400)
RBC: 3.03 MIL/uL — AB (ref 4.22–5.81)
RBC: 3.22 MIL/uL — ABNORMAL LOW (ref 4.22–5.81)
RDW: 14.3 % (ref 11.5–15.5)
RDW: 14.6 % (ref 11.5–15.5)
WBC: 4.2 10*3/uL (ref 4.0–10.5)
WBC: 4.8 10*3/uL (ref 4.0–10.5)

## 2017-10-11 LAB — NM MYOCAR MULTI W/SPECT W/WALL MOTION / EF
CHL CUP MPHR: 140 {beats}/min
CHL CUP RESTING HR STRESS: 84 {beats}/min
Estimated workload: 1 METS
Exercise duration (min): 6 min
Exercise duration (sec): 12 s
Peak HR: 110 {beats}/min
Percent HR: 78 %

## 2017-10-11 LAB — BASIC METABOLIC PANEL
Anion gap: 14 (ref 5–15)
BUN: 17 mg/dL (ref 8–23)
CALCIUM: 8 mg/dL — AB (ref 8.9–10.3)
CO2: 22 mmol/L (ref 22–32)
Chloride: 102 mmol/L (ref 98–111)
Creatinine, Ser: 1.73 mg/dL — ABNORMAL HIGH (ref 0.61–1.24)
GFR calc Af Amer: 41 mL/min — ABNORMAL LOW (ref >60)
GFR, EST NON AFRICAN AMERICAN: 36 mL/min — AB (ref >60)
GLUCOSE: 60 mg/dL — AB (ref 70–99)
POTASSIUM: 3 mmol/L — AB (ref 3.5–5.1)
Sodium: 138 mmol/L (ref 135–145)

## 2017-10-11 LAB — HEMOGLOBIN A1C
Hgb A1c MFr Bld: 4.6 % — ABNORMAL LOW (ref 4.8–5.6)
MEAN PLASMA GLUCOSE: 85.32 mg/dL

## 2017-10-11 LAB — PROTIME-INR
INR: 1.03
PROTHROMBIN TIME: 13.4 s (ref 11.4–15.2)

## 2017-10-11 LAB — HEPARIN LEVEL (UNFRACTIONATED)
HEPARIN UNFRACTIONATED: 0.17 [IU]/mL — AB (ref 0.30–0.70)
HEPARIN UNFRACTIONATED: 0.17 [IU]/mL — AB (ref 0.30–0.70)

## 2017-10-11 LAB — TROPONIN I
TROPONIN I: 0.91 ng/mL — AB (ref <0.03)
TROPONIN I: 1.06 ng/mL — AB (ref <0.03)
Troponin I: 0.98 ng/mL (ref <0.03)

## 2017-10-11 LAB — MRSA PCR SCREENING: MRSA BY PCR: NEGATIVE

## 2017-10-11 SURGERY — LEFT HEART CATH AND CORONARY ANGIOGRAPHY
Anesthesia: LOCAL

## 2017-10-11 MED ORDER — REGADENOSON 0.4 MG/5ML IV SOLN
0.4000 mg | Freq: Once | INTRAVENOUS | Status: AC
  Administered 2017-10-11: 0.4 mg via INTRAVENOUS

## 2017-10-11 MED ORDER — REGADENOSON 0.4 MG/5ML IV SOLN
INTRAVENOUS | Status: AC
  Filled 2017-10-11: qty 5

## 2017-10-11 MED ORDER — INFLUENZA VAC SPLIT HIGH-DOSE 0.5 ML IM SUSY
0.5000 mL | PREFILLED_SYRINGE | INTRAMUSCULAR | Status: AC
  Administered 2017-10-12: 0.5 mL via INTRAMUSCULAR
  Filled 2017-10-11: qty 0.5

## 2017-10-11 MED ORDER — ACETAMINOPHEN 500 MG PO TABS
ORAL_TABLET | ORAL | Status: AC
  Filled 2017-10-11: qty 1

## 2017-10-11 MED ORDER — ACETAMINOPHEN 500 MG PO TABS
500.0000 mg | ORAL_TABLET | ORAL | Status: DC | PRN
  Administered 2017-10-11 – 2017-10-17 (×12): 500 mg via ORAL
  Filled 2017-10-11 (×12): qty 1

## 2017-10-11 MED ORDER — TECHNETIUM TC 99M TETROFOSMIN IV KIT
10.0000 | PACK | Freq: Once | INTRAVENOUS | Status: AC | PRN
  Administered 2017-10-11: 10 via INTRAVENOUS

## 2017-10-11 MED ORDER — TECHNETIUM TC 99M TETROFOSMIN IV KIT
28.0000 | PACK | Freq: Once | INTRAVENOUS | Status: AC | PRN
  Administered 2017-10-11: 28 via INTRAVENOUS

## 2017-10-11 MED ORDER — OXYCODONE HCL 5 MG PO TABS
5.0000 mg | ORAL_TABLET | Freq: Four times a day (QID) | ORAL | Status: AC | PRN
  Administered 2017-10-11 – 2017-10-12 (×2): 5 mg via ORAL
  Filled 2017-10-11 (×2): qty 1

## 2017-10-11 NOTE — Progress Notes (Signed)
ANTICOAGULATION CONSULT NOTE   Pharmacy Consult for heparin Indication: chest pain/ACS  Allergies  Allergen Reactions  . Contrast Media [Iodinated Diagnostic Agents] Itching  . Diovan [Valsartan] Swelling    angioedema  . Neomycin Hives  . Neosporin [Neomycin-Bacitracin Zn-Polymyx] Hives    Patient Measurements: Height: 5\' 8"  (172.7 cm) Weight: 104 lb 0.9 oz (47.2 kg) IBW/kg (Calculated) : 68.4 Heparin Dosing Weight: 49.9  Vital Signs: Temp: 97.8 F (36.6 C) (09/05 1600) Temp Source: Oral (09/05 1600) BP: 137/48 (09/05 1600) Pulse Rate: 81 (09/05 1600)  Labs: Recent Labs    10/10/17 2031 10/11/17 0012 10/11/17 0531 10/11/17 1137 10/11/17 1547  HGB 11.4*  --  8.7*  --  9.4*  HCT 34.4*  --  26.8*  --  28.7*  PLT 133*  --  132*  --  153  LABPROT  --  13.4  --   --   --   INR  --  1.03  --   --   --   HEPARINUNFRC  --   --  0.17*  --  0.17*  CREATININE 1.79*  --  1.73*  --   --   CKTOTAL 484*  --   --   --   --   TROPONINI  --  0.91* 1.06* 0.98*  --     Estimated Creatinine Clearance: 22.7 mL/min (A) (by C-G formula based on SCr of 1.73 mg/dL (H)).   Medical History: Past Medical History:  Diagnosis Date  . Alcohol abuse   . Carotid artery disease (HCC)   . COPD (chronic obstructive pulmonary disease) (HCC)    emphysematous changes on past CT  . Coronary artery disease 04/27/2011   Low risk Myoview in 2010:Non-obstructive disase, except for small OMB 95% ostial lesion  . Dyslipidemia   . GERD (gastroesophageal reflux disease)   . H/O ETOH abuse   . Hypertension   . Pneumonia   . PVD (peripheral vascular disease) (HCC) 04/27/2011   normal renal & abdominal aortography; s/p Left Fem-Pop BPG 2004.    Assessment: 82yo M admitted 9/4 for weakness now starting heparin for ACS. Hgb 11.4, platelets 133  Follow up heparin level still low at 0.17 units/ml.  Hg increased to 9.4, no bleeding noted per RN. Stress imaging is awaiting read by cardiology.   Goal of  Therapy:  Heparin level 0.3-0.7 units/ml Monitor platelets by anticoagulation protocol: Yes   Plan:  Increase heparin to 850 units/hr 8hr heparin level Daily CBC and heparin level Monitor for bleeding complications  Sheppard Coil PharmD., BCPS Clinical Pharmacist 10/11/2017 5:54 PM

## 2017-10-11 NOTE — Progress Notes (Signed)
   Erico Herzhel Kocsis presented for a nuclear stress test today.  No immediate complications.  Stress imaging is pending at this time.  Preliminary EKG findings may be listed in the chart, but the stress test result will not be finalized until perfusion imaging is complete.  1 day study, [facility] to read.  Kendricks Reap, PA-C 10/11/2017, 1:50 PM

## 2017-10-11 NOTE — Progress Notes (Addendum)
Found patient with legs oob and attempting to sit on the side of the bed.  Bed alarm on, did not sound, will adjust bed alarm settings.  Patient angry that pain meds are not given timely, however, medication was given per MD order.  Will notify practitioner of need for more pain meds for neck and feet pain.  Orders received.

## 2017-10-11 NOTE — Progress Notes (Signed)
ANTICOAGULATION CONSULT NOTE   Pharmacy Consult for heparin Indication: chest pain/ACS  Allergies  Allergen Reactions  . Contrast Media [Iodinated Diagnostic Agents] Itching  . Diovan [Valsartan] Swelling    angioedema  . Neomycin Hives  . Neosporin [Neomycin-Bacitracin Zn-Polymyx] Hives    Patient Measurements: Height: 5\' 8"  (172.7 cm) Weight: 104 lb 0.9 oz (47.2 kg) IBW/kg (Calculated) : 68.4 Heparin Dosing Weight: 49.9  Vital Signs: Temp: 98.2 F (36.8 C) (09/05 0406) Temp Source: Oral (09/05 0406) BP: 134/80 (09/05 0406) Pulse Rate: 81 (09/05 0406)  Labs: Recent Labs    10/10/17 2031 10/11/17 0012 10/11/17 0531  HGB 11.4*  --  8.7*  HCT 34.4*  --  26.8*  PLT 133*  --  132*  LABPROT  --  13.4  --   INR  --  1.03  --   HEPARINUNFRC  --   --  0.17*  CREATININE 1.79*  --   --   CKTOTAL 484*  --   --   TROPONINI  --  0.91*  --     Estimated Creatinine Clearance: 22 mL/min (A) (by C-G formula based on SCr of 1.79 mg/dL (H)).   Medical History: Past Medical History:  Diagnosis Date  . Alcohol abuse   . Carotid artery disease (HCC)   . COPD (chronic obstructive pulmonary disease) (HCC)    emphysematous changes on past CT  . Coronary artery disease 04/27/2011   Low risk Myoview in 2010:Non-obstructive disase, except for small OMB 95% ostial lesion  . Dyslipidemia   . GERD (gastroesophageal reflux disease)   . H/O ETOH abuse   . Hypertension   . Pneumonia   . PVD (peripheral vascular disease) (HCC) 04/27/2011   normal renal & abdominal aortography; s/p Left Fem-Pop BPG 2004.    Medications:    Assessment: 82yo M admitted 9/4 for weakness now starting heparin for ACS. Hgb 11.4, platelets 133 Initial heparin level 0.17 units/ml.  Hg has dropped to 8.7, no bleeding noted per RN   Goal of Therapy:  Heparin level 0.3-0.7 units/ml Monitor platelets by anticoagulation protocol: Yes   Plan:  Increase heparin 700 unit/hr IV 8hr heparin level Daily CBC  and heparin level Monitor for bleeding complications   Talbert Cage, PharmD 10/11/2017    6:19 AM

## 2017-10-11 NOTE — Progress Notes (Signed)
CRITICAL VALUE ALERT  Critical Value:  0.91  Date & Time Notied:  10/11/17 @ 0124  Provider Notified: Gelene Mink  Orders Received/Actions taken:

## 2017-10-11 NOTE — Progress Notes (Signed)
Progress Note  Patient Name: Gary [name] Date of Encounter: 10/11/2017  Primary Cardiologist: Dr. Nanetta Batty  Subjective   Denies chest pain or shortness of breath today  Inpatient Medications    Scheduled Meds: . aspirin EC  81 mg Oral Daily  . atorvastatin  80 mg Oral q1800  . clopidogrel  75 mg Oral Daily  . fluticasone  1 spray Each Nare Daily  . [START ON 10/12/2017] Influenza vac split quadrivalent PF  0.5 mL Intramuscular Tomorrow-1000  . metoprolol succinate  25 mg Oral Daily  . mirtazapine  7.5 mg Oral QHS  . tamsulosin  0.4 mg Oral Daily   Continuous Infusions: . heparin 700 Units/hr (10/11/17 0623)   PRN Meds: acetaminophen, loratadine, nitroGLYCERIN, traZODone   Vital Signs    Vitals:   10/10/17 2349 10/11/17 0140 10/11/17 0406 10/11/17 0900  BP: (!) 183/93 (!) 155/78 134/80 138/68  Pulse:   81 83  Resp:  11 15 16   Temp: 98 F (36.7 C)  98.2 F (36.8 C) 98.6 F (37 C)  TempSrc: Oral  Oral Oral  SpO2: 100%  100% 100%  Weight: 47.2 kg  47.2 kg   Height: 5\' 8"  (1.727 m)       Intake/Output Summary (Last 24 hours) at 10/11/2017 1148 Last data filed at 10/11/2017 8270 Gross per 24 hour  Intake 394.9 ml  Output -  Net 394.9 ml   Filed Weights   10/10/17 1927 10/10/17 2349 10/11/17 0406  Weight: 49.9 kg 47.2 kg 47.2 kg    Telemetry    Sinus rhythm with occasional PVCs- Personally Reviewed  ECG    Not performed today- Personally Reviewed  Physical Exam   GEN: No acute distress.   Neck: No JVD Cardiac: RRR, no murmurs, rubs, or gallops.  Respiratory: Clear to auscultation bilaterally. GI: Soft, nontender, non-distended  MS: No edema; No deformity. Neuro:  Nonfocal  Psych: Normal affect   Labs    Chemistry Recent Labs  Lab 10/10/17 2031 10/11/17 0531  NA 141 138  K 2.8* 3.0*  CL 100 102  CO2 21* 22  GLUCOSE 73 60*  BUN 18 17  CREATININE 1.79* 1.73*  CALCIUM 8.6* 8.0*  PROT 5.1*  --   ALBUMIN 2.7*  --   AST 35   --   ALT 18  --   ALKPHOS 31*  --   BILITOT 1.5*  --   GFRNONAA 34* 36*  GFRAA 40* 41*  ANIONGAP 20* 14     Hematology Recent Labs  Lab 10/10/17 2031 10/11/17 0531  WBC 5.8 4.2  RBC 3.82* 3.03*  HGB 11.4* 8.7*  HCT 34.4* 26.8*  MCV 90.1 88.4  MCH 29.8 28.7  MCHC 33.1 32.5  RDW 14.3 14.3  PLT 133* 132*    Cardiac Enzymes Recent Labs  Lab 10/11/17 0012 10/11/17 0531  TROPONINI 0.91* 1.06*    Recent Labs  Lab 10/10/17 2039  TROPIPOC 0.81*     BNPNo results for input(s): BNP, PROBNP in the last 168 hours.   DDimer No results for input(s): DDIMER in the last 168 hours.   Radiology    No results found.  Cardiac Studies   None   Patient Profile     82 y.o. male frail-appearing Caucasian male who has been taking care of for many years.  He has a history of noncritical CAD by cath in 2013 and peripheral arterial disease status post left femoropopliteal bypass grafting by Dr. Arbie Cookey remotely.  His  other problems include hypertension, ethanol abuse, COPD and dyslipidemia.  When I saw him back in the office 07/13/2017 he was stable.  He was on the ground for 8 hours at home unable to get up and was brought to the hospital.  His troponins were mildly elevated at 1.  His serum creatinine is 1.7.  He has chronic EKG changes.  He denies chest pain.  Assessment & Plan    1: Non-STEMI- troponins 1.06.  His serum creatinine is 1.7 with a clearance of approximately 40 cc/min.  He was on the ground for 8 hours.  His CK total is 44 suggesting an element of rhabdomyolysis.  I do not think this represents acute coronary syndrome.  He was scheduled for cardiac catheterization which I have canceled.  Arranging for him to undergo pharmacologic Myoview stress test to further risk stratify.  2: Peripheral arterial disease- stable  3: Essential hypertension- indications at home of include metoprolol which she currently is not on however his blood pressures are remained fairly  stable  4: Hyperlipidemia-on statin therapy  Mr. Senske is going to have a pharmacologic Myoview stress test.  I have taken the liberty of canceling his cardiac catheterization.  His symptoms and presentation were not consistent with acute coronary syndrome.  If his Myoview was low risk he can be discharged home either later today or tomorrow.  Will need to closely follow his renal function given his elevated CPK in the setting of rhabdomyolysis.     For questions or updates, please contact [facility] Please consult [email or web address] for contact info under Cardiology/STEMI.      Signed, Nanetta Batty, MD  10/11/2017, 11:48 AM

## 2017-10-12 ENCOUNTER — Encounter (HOSPITAL_COMMUNITY)
Admission: EM | Disposition: A | Discharge status: Skilled Nursing Facility | Payer: Self-pay | Source: Home / Self Care | Attending: Cardiovascular Disease

## 2017-10-12 ENCOUNTER — Inpatient Hospital Stay (HOSPITAL_COMMUNITY): Payer: Medicare Other

## 2017-10-12 DIAGNOSIS — I2510 Atherosclerotic heart disease of native coronary artery without angina pectoris: Secondary | ICD-10-CM

## 2017-10-12 DIAGNOSIS — I340 Nonrheumatic mitral (valve) insufficiency: Secondary | ICD-10-CM

## 2017-10-12 DIAGNOSIS — R9439 Abnormal result of other cardiovascular function study: Secondary | ICD-10-CM

## 2017-10-12 HISTORY — PX: LEFT HEART CATH AND CORONARY ANGIOGRAPHY: CATH118249

## 2017-10-12 LAB — CBC
HCT: 28.6 % — ABNORMAL LOW (ref 39.0–52.0)
HEMOGLOBIN: 9.4 g/dL — AB (ref 13.0–17.0)
MCH: 29.5 pg (ref 26.0–34.0)
MCHC: 32.9 g/dL (ref 30.0–36.0)
MCV: 89.7 fL (ref 78.0–100.0)
PLATELETS: 149 10*3/uL — AB (ref 150–400)
RBC: 3.19 MIL/uL — AB (ref 4.22–5.81)
RDW: 14.5 % (ref 11.5–15.5)
WBC: 7.6 10*3/uL (ref 4.0–10.5)

## 2017-10-12 LAB — POCT ACTIVATED CLOTTING TIME: ACTIVATED CLOTTING TIME: 131 s

## 2017-10-12 LAB — BASIC METABOLIC PANEL
Anion gap: 12 (ref 5–15)
BUN: 14 mg/dL (ref 8–23)
CHLORIDE: 102 mmol/L (ref 98–111)
CO2: 23 mmol/L (ref 22–32)
CREATININE: 1.74 mg/dL — AB (ref 0.61–1.24)
Calcium: 8.3 mg/dL — ABNORMAL LOW (ref 8.9–10.3)
GFR calc Af Amer: 41 mL/min — ABNORMAL LOW (ref >60)
GFR calc non Af Amer: 35 mL/min — ABNORMAL LOW (ref >60)
Glucose, Bld: 81 mg/dL (ref 70–99)
POTASSIUM: 3.1 mmol/L — AB (ref 3.5–5.1)
SODIUM: 137 mmol/L (ref 135–145)

## 2017-10-12 LAB — ECHOCARDIOGRAM COMPLETE
HEIGHTINCHES: 68 in
WEIGHTICAEL: 1696.66 [oz_av]

## 2017-10-12 LAB — MAGNESIUM: Magnesium: 1.6 mg/dL — ABNORMAL LOW (ref 1.7–2.4)

## 2017-10-12 LAB — HEPARIN LEVEL (UNFRACTIONATED)
HEPARIN UNFRACTIONATED: 0.72 [IU]/mL — AB (ref 0.30–0.70)
Heparin Unfractionated: 0.36 IU/mL (ref 0.30–0.70)

## 2017-10-12 SURGERY — LEFT HEART CATH AND CORONARY ANGIOGRAPHY
Anesthesia: LOCAL

## 2017-10-12 MED ORDER — POTASSIUM CHLORIDE CRYS ER 20 MEQ PO TBCR
40.0000 meq | EXTENDED_RELEASE_TABLET | Freq: Once | ORAL | Status: DC
  Filled 2017-10-12: qty 2

## 2017-10-12 MED ORDER — LIDOCAINE HCL (PF) 1 % IJ SOLN
INTRAMUSCULAR | Status: DC | PRN
  Administered 2017-10-12: 2 mL

## 2017-10-12 MED ORDER — MIDAZOLAM HCL 2 MG/2ML IJ SOLN
INTRAMUSCULAR | Status: DC | PRN
  Administered 2017-10-12: 1 mg via INTRAVENOUS

## 2017-10-12 MED ORDER — METHYLPREDNISOLONE SODIUM SUCC 125 MG IJ SOLR
125.0000 mg | Freq: Once | INTRAMUSCULAR | Status: AC
  Administered 2017-10-12: 125 mg via INTRAVENOUS
  Filled 2017-10-12: qty 2

## 2017-10-12 MED ORDER — VERAPAMIL HCL 2.5 MG/ML IV SOLN
INTRAVENOUS | Status: DC | PRN
  Administered 2017-10-12: 8 mL via INTRA_ARTERIAL

## 2017-10-12 MED ORDER — SODIUM CHLORIDE 0.9 % IV SOLN: 250.0000 mL | INTRAVENOUS | Status: DC | PRN

## 2017-10-12 MED ORDER — ONDANSETRON HCL 4 MG/2ML IJ SOLN: 4.0000 mg | Freq: Four times a day (QID) | INTRAMUSCULAR | Status: DC | PRN

## 2017-10-12 MED ORDER — SODIUM CHLORIDE 0.9 % WEIGHT BASED INFUSION: 1.0000 mL/kg/h | INTRAVENOUS | Status: AC

## 2017-10-12 MED ORDER — POTASSIUM CHLORIDE CRYS ER 20 MEQ PO TBCR
40.0000 meq | EXTENDED_RELEASE_TABLET | Freq: Once | ORAL | Status: AC
  Administered 2017-10-12: 40 meq via ORAL
  Filled 2017-10-12: qty 2

## 2017-10-12 MED ORDER — DIPHENHYDRAMINE HCL 50 MG/ML IJ SOLN
25.0000 mg | Freq: Once | INTRAMUSCULAR | Status: AC
  Administered 2017-10-12: 25 mg via INTRAVENOUS
  Filled 2017-10-12: qty 1

## 2017-10-12 MED ORDER — SODIUM CHLORIDE 0.9% FLUSH: 3.0000 mL | INTRAVENOUS | Status: DC | PRN

## 2017-10-12 MED ORDER — IOHEXOL 350 MG/ML SOLN
INTRAVENOUS | Status: DC | PRN
  Administered 2017-10-12: 35 mL via INTRAVENOUS

## 2017-10-12 MED ORDER — MIDAZOLAM HCL 2 MG/2ML IJ SOLN
INTRAMUSCULAR | Status: AC
  Filled 2017-10-12: qty 2

## 2017-10-12 MED ORDER — HEPARIN (PORCINE) IN NACL 1000-0.9 UT/500ML-% IV SOLN
INTRAVENOUS | Status: DC | PRN
  Administered 2017-10-12: 500 mL

## 2017-10-12 MED ORDER — ASPIRIN 81 MG PO CHEW: 81.0000 mg | CHEWABLE_TABLET | ORAL | Status: DC

## 2017-10-12 MED ORDER — LIDOCAINE HCL (PF) 1 % IJ SOLN
INTRAMUSCULAR | Status: AC
  Filled 2017-10-12: qty 30

## 2017-10-12 MED ORDER — SODIUM CHLORIDE 0.9% FLUSH
3.0000 mL | Freq: Two times a day (BID) | INTRAVENOUS | Status: DC
  Administered 2017-10-12: 3 mL via INTRAVENOUS

## 2017-10-12 MED ORDER — VERAPAMIL HCL 2.5 MG/ML IV SOLN
INTRAVENOUS | Status: AC
  Filled 2017-10-12: qty 2

## 2017-10-12 MED ORDER — SODIUM CHLORIDE 0.9 % IV SOLN
INTRAVENOUS | Status: DC
  Administered 2017-10-12: 12:00:00 via INTRAVENOUS

## 2017-10-12 MED ORDER — METOPROLOL SUCCINATE ER 50 MG PO TB24
50.0000 mg | ORAL_TABLET | Freq: Every day | ORAL | Status: DC
  Administered 2017-10-13 – 2017-10-16 (×3): 50 mg via ORAL
  Filled 2017-10-12 (×4): qty 1

## 2017-10-12 MED ORDER — SODIUM CHLORIDE 0.9% FLUSH
3.0000 mL | Freq: Two times a day (BID) | INTRAVENOUS | Status: DC
  Administered 2017-10-13 – 2017-10-17 (×9): 3 mL via INTRAVENOUS

## 2017-10-12 MED ORDER — METOPROLOL SUCCINATE ER 25 MG PO TB24
25.0000 mg | ORAL_TABLET | Freq: Once | ORAL | Status: AC
  Administered 2017-10-12: 25 mg via ORAL
  Filled 2017-10-12: qty 1

## 2017-10-12 SURGICAL SUPPLY — 16 items
CATH INFINITI 4FR 3 DRC (CATHETERS) ×2 IMPLANT
CATH INFINITI MULTIPACK ST 5F (CATHETERS) ×2 IMPLANT
DEVICE RAD COMP TR BAND LRG (VASCULAR PRODUCTS) ×2 IMPLANT
GLIDESHEATH SLEND SS 6F .021 (SHEATH) ×2 IMPLANT
GUIDEWIRE INQWIRE 1.5J.035X260 (WIRE) ×1 IMPLANT
INQWIRE 1.5J .035X260CM (WIRE) ×2
KIT HEART LEFT (KITS) ×2 IMPLANT
KIT MICROPUNCTURE NIT STIFF (SHEATH) ×2 IMPLANT
PACK CARDIAC CATHETERIZATION (CUSTOM PROCEDURE TRAY) ×2 IMPLANT
SHEATH GLIDE SLENDER 4/5FR (SHEATH) ×2 IMPLANT
SHEATH PINNACLE 5F 10CM (SHEATH) ×2 IMPLANT
SHEATH PROBE COVER 6X72 (BAG) ×2 IMPLANT
TRANSDUCER W/STOPCOCK (MISCELLANEOUS) ×2 IMPLANT
TUBING CIL FLEX 10 FLL-RA (TUBING) ×2 IMPLANT
WIRE AQUATRAK .035X150 ANG (WIRE) ×2 IMPLANT
WIRE HI TORQ VERSACORE-J 145CM (WIRE) ×2 IMPLANT

## 2017-10-12 NOTE — Progress Notes (Signed)
ANTICOAGULATION CONSULT NOTE - Follow Up Consult  Pharmacy Consult for heparin Indication: NSTEMI  Labs: Recent Labs    10/10/17 2031 10/11/17 0012 10/11/17 0531 10/11/17 1137 10/11/17 1547 10/12/17 0534  HGB 11.4*  --  8.7*  --  9.4* 9.4*  HCT 34.4*  --  26.8*  --  28.7* 28.6*  PLT 133*  --  132*  --  153 149*  LABPROT  --  13.4  --   --   --   --   INR  --  1.03  --   --   --   --   HEPARINUNFRC  --   --  0.17*  --  0.17* 0.72*  CREATININE 1.79*  --  1.73*  --   --   --   CKTOTAL 484*  --   --   --   --   --   TROPONINI  --  0.91* 1.06* 0.98*  --   --     Assessment: 82yo male now supratherapeutic on heparin after rate change; RN reports small amount of blood oozing from IV site.  Goal of Therapy:  Heparin level 0.3-0.7 units/ml   Plan:  Will decrease heparin gtt by 1 units/kg/hr to 800 units/hr and check level in 8 hours.    Vernard Gambles, PharmD, BCPS  10/12/2017,6:50 AM

## 2017-10-12 NOTE — Interval H&P Note (Signed)
History and Physical Interval Note:  10/12/2017 4:15 PM  Gary [name]  has presented today for surgery, with the diagnosis of failed myoview  The various methods of treatment have been discussed with the patient and family. After consideration of risks, benefits and other options for treatment, the patient has consented to  Procedure(s): LEFT HEART CATH AND CORONARY ANGIOGRAPHY (N/A) as a surgical intervention .  The patient's history has been reviewed, patient examined, no change in status, stable for surgery.  I have reviewed the patient's chart and labs.  Questions were answered to the patient's satisfaction.     Tonny Bollman

## 2017-10-12 NOTE — Consult Note (Addendum)
[facility] CM Primary Care Navigator  10/12/2017  Gary [name] 03-Nov-1935 [id number]   Gary [name] to see patient at the bedside earlier to identify possible discharge needs but he is off the unit to cath lab for a procedure per staff. (left cardiac cath)  Will attempt to see patient at another time when available in the room.   AddendumMartin [name] back to see patient in the room to identify possible discharge needs but he is just being rolled back in to the unit after the procedure.   Will try to see patient again at another time when he is available.     Addendum (10/15/17):  Went back to seepatient at the bedside and met with his daughter Gary [name]- lives in [place]) as well, to identify possible discharge needs. Daughter reportsthat patient was "found on the floor and unable to get up" that resulted to this admission. (NSTEMI- non ST elevation myocardial infarction, hypertensive urgency)  Patient Gary Grammes, PA with [employer] [facility] as his primary care provider.   [facility] [address] [facility].   Daughter reportsthat patient's son and another daughter (Gary [name]) are assisting in managing patient's medications at home using"pill box" system filled once a week.  Patient's daughters have been driving and providing transportation tohis doctors'appointments.  Patient lives with son (has cerebral palsy) for years. Patient's son and daughter- Gary [name] serve as the primary caregivers for patient at home.  Anticipated discharge plan isskilled nursing facility (SNF) for rehabilitation per therapy recommendation.  Patient and daughter voiced understanding to call primary care provider's office once he gets back home, for a post discharge follow-up appointment within a1- 2 weeksor sooner if needs arise.Patient letter (with PCP's contact number) was provided as a  reminder.  Discussed with daughter and patientregarding [facility] CM services available for health managementand resourcesat homebut denied any health issues or needs for now.Encouraged daughter and patientto seekreferralfrom primary care provider to [facility] care management ifdeemed necessary and appropriatefor any services in thenear future- oncepatientreturnsback home.  [facility] care management information was provided for future needsthatpatient may have.    For additional questions please contact:  Edwena Felty A. Benicio Manna, BSN, RN-BC [facility] PRIMARY CARE Navigator Cell: [phone]

## 2017-10-12 NOTE — Progress Notes (Addendum)
Progress Note  Patient Name: Gary [name] Date of Encounter: 10/12/2017  Primary Cardiologist: Dr. Nanetta Batty  Subjective   Denies chest pain or shortness of breath today.  A Myoview was performed yesterday that was read as intermediate risk with severe LV dysfunction, EF of 33% and a moderate size inferolateral scar with peri-infarct ischemia.  Based on this, his mildly positive troponins I recommended that we proceed with diagnostic coronary angiography.  Inpatient Medications    Scheduled Meds: . aspirin EC  81 mg Oral Daily  . atorvastatin  80 mg Oral q1800  . clopidogrel  75 mg Oral Daily  . fluticasone  1 spray Each Nare Daily  . metoprolol succinate  25 mg Oral Daily  . mirtazapine  7.5 mg Oral QHS  . tamsulosin  0.4 mg Oral Daily   Continuous Infusions: . heparin 800 Units/hr (10/12/17 0654)   PRN Meds: acetaminophen, loratadine, nitroGLYCERIN, traZODone   Vital Signs    Vitals:   10/11/17 1945 10/11/17 2346 10/12/17 0003 10/12/17 0536  BP: 138/61  (!) 104/40 (!) 141/34  Pulse: 74  73 71  Resp: (!) 24  20 13   Temp: (!) 97.3 F (36.3 C) 97.9 F (36.6 C)  (!) 97.4 F (36.3 C)  TempSrc: Oral Oral  Oral  SpO2: 99%  97% 99%  Weight:    48.1 kg  Height:        Intake/Output Summary (Last 24 hours) at 10/12/2017 1104 Last data filed at 10/11/2017 1700 Gross per 24 hour  Intake 360 ml  Output 301 ml  Net 59 ml   Filed Weights   10/10/17 2349 10/11/17 0406 10/12/17 0536  Weight: 47.2 kg 47.2 kg 48.1 kg    Telemetry    Sinus rhythm with occasional PVCs- Personally Reviewed  ECG    Not performed today- Personally Reviewed  Physical Exam   GEN: No acute distress.   Neck: No JVD Cardiac: RRR, no murmurs, rubs, or gallops.  Respiratory: Clear to auscultation bilaterally. GI: Soft, nontender, non-distended  MS: No edema; No deformity. Neuro:  Nonfocal  Psych: Normal affect   Labs    Chemistry Recent Labs  Lab 10/10/17 2031  10/11/17 0531 10/12/17 0534  NA 141 138 137  K 2.8* 3.0* 3.1*  CL 100 102 102  CO2 21* 22 23  GLUCOSE 73 60* 81  BUN 18 17 14   CREATININE 1.79* 1.73* 1.74*  CALCIUM 8.6* 8.0* 8.3*  PROT 5.1*  --   --   ALBUMIN 2.7*  --   --   AST 35  --   --   ALT 18  --   --   ALKPHOS 31*  --   --   BILITOT 1.5*  --   --   GFRNONAA 34* 36* 35*  GFRAA 40* 41* 41*  ANIONGAP 20* 14 12     Hematology Recent Labs  Lab 10/11/17 0531 10/11/17 1547 10/12/17 0534  WBC 4.2 4.8 7.6  RBC 3.03* 3.22* 3.19*  HGB 8.7* 9.4* 9.4*  HCT 26.8* 28.7* 28.6*  MCV 88.4 89.1 89.7  MCH 28.7 29.2 29.5  MCHC 32.5 32.8 32.9  RDW 14.3 14.6 14.5  PLT 132* 153 149*    Cardiac Enzymes Recent Labs  Lab 10/11/17 0012 10/11/17 0531 10/11/17 1137  TROPONINI 0.91* 1.06* 0.98*    Recent Labs  Lab 10/10/17 2039  TROPIPOC 0.81*     BNPNo results for input(s): BNP, PROBNP in the last 168 hours.   DDimer No  results for input(s): DDIMER in the last 168 hours.   Radiology    Nm Myocar Multi W/spect W/wall Motion / Ef  Result Date: 10/11/2017  There was no ST segment deviation noted during stress.  Findings consistent with prior myocardial infarction with peri-infarct ischemia.  The left ventricular ejection fraction is moderately decreased (30-44%).  1. EF 33%, diffuse hypokinesis. 2. Fixed large, severe basal to apical inferior and inferolateral perfusion defect suggestive of prior infarction. Partially reversible medium-sized, mild mid to apical anteroseptal/anterior perfusion defect suggesting infarction with peri-infarct ischemia. Intermediate risk study.  Moderately decreased systolic function.  Inferior/inferolateral infarction.  Mid to apical anteroseptal relatively mild defect that is partially reversible, may be evidence for infarction with peri-infarct ischemia.    Cardiac Studies   None   Patient Profile     82 y.o. male frail-appearing Caucasian male who has been taking care of for many  years.  He has a history of noncritical CAD by cath in 2013 and peripheral arterial disease status post left femoropopliteal bypass grafting by Dr. Arbie Cookey remotely.  His other problems include hypertension, ethanol abuse, COPD and dyslipidemia.  When I saw him back in the office 07/13/2017 he was stable.  He was on the ground for 8 hours at home unable to get up and was brought to the hospital.  His troponins were mildly elevated at 1.  His serum creatinine is 1.7.  He has chronic EKG changes.  He denies chest pain.  He underwent pharmacologic Myoview stress testing yesterday revealed an EF of 33%, a moderate size inferolateral scar with peri-infarct ischemia.  Assessment & Plan    1: Non-STEMI- troponins 1.06.  His serum creatinine is 1.7 with a clearance of approximately 40 cc/min which has remained stable.  He was on the ground for 8 hours.  His CK total is 44 suggesting an element of rhabdomyolysis.  I do not think this represents acute coronary syndrome.    A Myoview stress test performed yesterday showed an EF of 33%, moderate size inferolateral scar with peri-infarct ischemia.  2D echo is pending.  Based on this, I decided to proceed with diagnostic coronary angiography to define his anatomy.  2: Peripheral arterial disease- stable  3: Essential hypertension- indications at home of include metoprolol which she currently is not on however his blood pressures are remained fairly stable  4: Hyperlipidemia-on statin therapy  Mr. Forsey had a pharmacologic Myoview stress test yesterday that was intermediate risk with severe LV dysfunction, EF of 33%, and inferolateral scar with peri-infarct ischemia.  Based on these findings, and a mildly elevated troponin, I recommended that we proceed with diagnostic coronary angiography.  In addition, after speaking with his daughter, I agree that he will need to be transferred to a rehab facility such as camped in place where he is been in before to regain his  strength after which he can be tested sided whether or not he should return home or consider SNF.  I am going to get the social worker to address this.     For questions or updates, please contact [facility] Please consult [email or web address] for contact info under Cardiology/STEMI.      Signed, Nanetta Batty, MD  10/12/2017, 11:04 AM

## 2017-10-12 NOTE — Plan of Care (Signed)
  Problem: Education: Goal: Knowledge of General Education information will improve Description Including pain rating scale, medication(s)/side effects and non-pharmacologic comfort measures Outcome: Progressing   Problem: Health Behavior/Discharge Planning: Goal: Ability to manage health-related needs will improve Outcome: Progressing   

## 2017-10-12 NOTE — Progress Notes (Signed)
Patient received back from cath lab. R groin and R wrist sites stable.

## 2017-10-12 NOTE — Progress Notes (Signed)
ANTICOAGULATION CONSULT NOTE   Pharmacy Consult for heparin Indication: chest pain/ACS  Allergies  Allergen Reactions  . Contrast Media [Iodinated Diagnostic Agents] Itching  . Diovan [Valsartan] Swelling    angioedema  . Neomycin Hives  . Neosporin [Neomycin-Bacitracin Zn-Polymyx] Hives    Patient Measurements: Height: 5\' 8"  (172.7 cm) Weight: 106 lb 0.7 oz (48.1 kg) IBW/kg (Calculated) : 68.4 Heparin Dosing Weight: 49.9  Vital Signs: Temp: 97.4 F (36.3 C) (09/06 0536) Temp Source: Oral (09/06 0536) BP: 141/34 (09/06 0536) Pulse Rate: 71 (09/06 0536)  Labs: Recent Labs    10/10/17 2031 10/11/17 0012  10/11/17 0531 10/11/17 1137 10/11/17 1547 10/12/17 0534 10/12/17 1433  HGB 11.4*  --   --  8.7*  --  9.4* 9.4*  --   HCT 34.4*  --   --  26.8*  --  28.7* 28.6*  --   PLT 133*  --   --  132*  --  153 149*  --   LABPROT  --  13.4  --   --   --   --   --   --   INR  --  1.03  --   --   --   --   --   --   HEPARINUNFRC  --   --    < > 0.17*  --  0.17* 0.72* 0.36  CREATININE 1.79*  --   --  1.73*  --   --  1.74*  --   CKTOTAL 484*  --   --   --   --   --   --   --   TROPONINI  --  0.91*  --  1.06* 0.98*  --   --   --    < > = values in this interval not displayed.    Estimated Creatinine Clearance: 23 mL/min (A) (by C-G formula based on SCr of 1.74 mg/dL (H)).   Medical History: Past Medical History:  Diagnosis Date  . Alcohol abuse   . Carotid artery disease (HCC)   . COPD (chronic obstructive pulmonary disease) (HCC)    emphysematous changes on past CT  . Coronary artery disease 04/27/2011   Low risk Myoview in 2010:Non-obstructive disase, except for small OMB 95% ostial lesion  . Dyslipidemia   . GERD (gastroesophageal reflux disease)   . H/O ETOH abuse   . Hypertension   . Pneumonia   . PVD (peripheral vascular disease) (HCC) 04/27/2011   normal renal & abdominal aortography; s/p Left Fem-Pop BPG 2004.    Assessment: 82yo M admitted 9/4 for weakness  now starting heparin for ACS. Hgb 11.4, platelets 133  Follow up heparin level at goal 0.3 units/ml this afternoon. No issues noted.   Goal of Therapy:  Heparin level 0.3-0.7 units/ml Monitor platelets by anticoagulation protocol: Yes   Plan:  Continue heparin at 800 units/hr Daily CBC and heparin level Monitor for bleeding complications  Sheppard Coil PharmD., BCPS Clinical Pharmacist 10/12/2017 3:54 PM

## 2017-10-12 NOTE — H&P (View-Only) (Signed)
 Progress Note  Patient Name: Gary [name] Date of Encounter: 10/12/2017  Primary Cardiologist: Dr. Jonathan Berry  Subjective   Denies chest pain or shortness of breath today.  A Myoview was performed yesterday that was read as intermediate risk with severe LV dysfunction, EF of 33% and a moderate size inferolateral scar with peri-infarct ischemia.  Based on this, his mildly positive troponins I recommended that we proceed with diagnostic coronary angiography.  Inpatient Medications    Scheduled Meds: . aspirin EC  81 mg Oral Daily  . atorvastatin  80 mg Oral q1800  . clopidogrel  75 mg Oral Daily  . fluticasone  1 spray Each Nare Daily  . metoprolol succinate  25 mg Oral Daily  . mirtazapine  7.5 mg Oral QHS  . tamsulosin  0.4 mg Oral Daily   Continuous Infusions: . heparin 800 Units/hr (10/12/17 0654)   PRN Meds: acetaminophen, loratadine, nitroGLYCERIN, traZODone   Vital Signs    Vitals:   10/11/17 1945 10/11/17 2346 10/12/17 0003 10/12/17 0536  BP: 138/61  (!) 104/40 (!) 141/34  Pulse: 74  73 71  Resp: (!) 24  20 13  Temp: (!) 97.3 F (36.3 C) 97.9 F (36.6 C)  (!) 97.4 F (36.3 C)  TempSrc: Oral Oral  Oral  SpO2: 99%  97% 99%  Weight:    48.1 kg  Height:        Intake/Output Summary (Last 24 hours) at 10/12/2017 1104 Last data filed at 10/11/2017 1700 Gross per 24 hour  Intake 360 ml  Output 301 ml  Net 59 ml   Filed Weights   10/10/17 2349 10/11/17 0406 10/12/17 0536  Weight: 47.2 kg 47.2 kg 48.1 kg    Telemetry    Sinus rhythm with occasional PVCs- Personally Reviewed  ECG    Not performed today- Personally Reviewed  Physical Exam   GEN: No acute distress.   Neck: No JVD Cardiac: RRR, no murmurs, rubs, or gallops.  Respiratory: Clear to auscultation bilaterally. GI: Soft, nontender, non-distended  MS: No edema; No deformity. Neuro:  Nonfocal  Psych: Normal affect   Labs    Chemistry Recent Labs  Lab 10/10/17 2031  10/11/17 0531 10/12/17 0534  NA 141 138 137  K 2.8* 3.0* 3.1*  CL 100 102 102  CO2 21* 22 23  GLUCOSE 73 60* 81  BUN 18 17 14  CREATININE 1.79* 1.73* 1.74*  CALCIUM 8.6* 8.0* 8.3*  PROT 5.1*  --   --   ALBUMIN 2.7*  --   --   AST 35  --   --   ALT 18  --   --   ALKPHOS 31*  --   --   BILITOT 1.5*  --   --   GFRNONAA 34* 36* 35*  GFRAA 40* 41* 41*  ANIONGAP 20* 14 12     Hematology Recent Labs  Lab 10/11/17 0531 10/11/17 1547 10/12/17 0534  WBC 4.2 4.8 7.6  RBC 3.03* 3.22* 3.19*  HGB 8.7* 9.4* 9.4*  HCT 26.8* 28.7* 28.6*  MCV 88.4 89.1 89.7  MCH 28.7 29.2 29.5  MCHC 32.5 32.8 32.9  RDW 14.3 14.6 14.5  PLT 132* 153 149*    Cardiac Enzymes Recent Labs  Lab 10/11/17 0012 10/11/17 0531 10/11/17 1137  TROPONINI 0.91* 1.06* 0.98*    Recent Labs  Lab 10/10/17 2039  TROPIPOC 0.81*     BNPNo results for input(s): BNP, PROBNP in the last 168 hours.   DDimer No   results for input(s): DDIMER in the last 168 hours.   Radiology    Nm Myocar Multi W/spect W/wall Motion / Ef  Result Date: 10/11/2017  There was no ST segment deviation noted during stress.  Findings consistent with prior myocardial infarction with peri-infarct ischemia.  The left ventricular ejection fraction is moderately decreased (30-44%).  1. EF 33%, diffuse hypokinesis. 2. Fixed large, severe basal to apical inferior and inferolateral perfusion defect suggestive of prior infarction. Partially reversible medium-sized, mild mid to apical anteroseptal/anterior perfusion defect suggesting infarction with peri-infarct ischemia. Intermediate risk study.  Moderately decreased systolic function.  Inferior/inferolateral infarction.  Mid to apical anteroseptal relatively mild defect that is partially reversible, may be evidence for infarction with peri-infarct ischemia.    Cardiac Studies   None   Patient Profile     82 y.o. male frail-appearing Caucasian male who has been taking care of for many  years.  He has a history of noncritical CAD by cath in 2013 and peripheral arterial disease status post left femoropopliteal bypass grafting by Dr. Early remotely.  His other problems include hypertension, ethanol abuse, COPD and dyslipidemia.  When I saw him back in the office 07/13/2017 he was stable.  He was on the ground for 8 hours at home unable to get up and was brought to the hospital.  His troponins were mildly elevated at 1.  His serum creatinine is 1.7.  He has chronic EKG changes.  He denies chest pain.  He underwent pharmacologic Myoview stress testing yesterday revealed an EF of 33%, a moderate size inferolateral scar with peri-infarct ischemia.  Assessment & Plan    1: Non-STEMI- troponins 1.06.  His serum creatinine is 1.7 with a clearance of approximately 40 cc/min which has remained stable.  He was on the ground for 8 hours.  His CK total is 44 suggesting an element of rhabdomyolysis.  I do not think this represents acute coronary syndrome.    A Myoview stress test performed yesterday showed an EF of 33%, moderate size inferolateral scar with peri-infarct ischemia.  2D echo is pending.  Based on this, I decided to proceed with diagnostic coronary angiography to define his anatomy.  2: Peripheral arterial disease- stable  3: Essential hypertension- indications at home of include metoprolol which she currently is not on however his blood pressures are remained fairly stable  4: Hyperlipidemia-on statin therapy  Mr. Hyer had a pharmacologic Myoview stress test yesterday that was intermediate risk with severe LV dysfunction, EF of 33%, and inferolateral scar with peri-infarct ischemia.  Based on these findings, and a mildly elevated troponin, I recommended that we proceed with diagnostic coronary angiography.  In addition, after speaking with his daughter, I agree that he will need to be transferred to a rehab facility such as camped in place where he is been in before to regain his  strength after which he can be tested sided whether or not he should return home or consider SNF.  I am going to get the social worker to address this.     For questions or updates, please contact [facility] Please consult [email or web address] for contact info under Cardiology/STEMI.      Signed, Jonathan Berry, MD  10/12/2017, 11:04 AM    

## 2017-10-12 NOTE — Progress Notes (Signed)
  Echocardiogram 2D Echocardiogram has been performed.  Gary [name] 10/12/2017, 12:08 PM

## 2017-10-13 DIAGNOSIS — E7800 Pure hypercholesterolemia, unspecified: Secondary | ICD-10-CM

## 2017-10-13 DIAGNOSIS — I10 Essential (primary) hypertension: Secondary | ICD-10-CM

## 2017-10-13 DIAGNOSIS — I471 Supraventricular tachycardia: Secondary | ICD-10-CM

## 2017-10-13 LAB — BASIC METABOLIC PANEL
ANION GAP: 9 (ref 5–15)
BUN: 19 mg/dL (ref 8–23)
CALCIUM: 7.8 mg/dL — AB (ref 8.9–10.3)
CHLORIDE: 105 mmol/L (ref 98–111)
CO2: 21 mmol/L — AB (ref 22–32)
CREATININE: 1.95 mg/dL — AB (ref 0.61–1.24)
GFR calc Af Amer: 36 mL/min — ABNORMAL LOW (ref >60)
GFR calc non Af Amer: 31 mL/min — ABNORMAL LOW (ref >60)
GLUCOSE: 163 mg/dL — AB (ref 70–99)
POTASSIUM: 4.7 mmol/L (ref 3.5–5.1)
Sodium: 135 mmol/L (ref 135–145)

## 2017-10-13 LAB — CBC
HEMATOCRIT: 28.7 % — AB (ref 39.0–52.0)
HEMOGLOBIN: 9.3 g/dL — AB (ref 13.0–17.0)
MCH: 28.9 pg (ref 26.0–34.0)
MCHC: 32.4 g/dL (ref 30.0–36.0)
MCV: 89.1 fL (ref 78.0–100.0)
Platelets: 167 10*3/uL (ref 150–400)
RBC: 3.22 MIL/uL — AB (ref 4.22–5.81)
RDW: 14.7 % (ref 11.5–15.5)
WBC: 6.3 10*3/uL (ref 4.0–10.5)

## 2017-10-13 LAB — MAGNESIUM: Magnesium: 1.6 mg/dL — ABNORMAL LOW (ref 1.7–2.4)

## 2017-10-13 MED ORDER — MAGNESIUM OXIDE 400 (241.3 MG) MG PO TABS
400.0000 mg | ORAL_TABLET | Freq: Once | ORAL | Status: AC
  Administered 2017-10-13: 400 mg via ORAL
  Filled 2017-10-13: qty 1

## 2017-10-13 MED ORDER — ISOSORBIDE MONONITRATE ER 30 MG PO TB24
15.0000 mg | ORAL_TABLET | Freq: Every day | ORAL | Status: DC
  Administered 2017-10-13 – 2017-10-17 (×5): 15 mg via ORAL
  Filled 2017-10-13 (×5): qty 1

## 2017-10-13 NOTE — Progress Notes (Signed)
CARDIAC REHAB PHASE I   PRE:  Rate/Rhythm: 75 SR  BP:  Sitting: 175/82 RECHECK 172/81      SaO2: 98% RA   MODE:  Ambulation: 0 ft   POST:  Rate/Rhythm: 70 SR   BP:  Sitting: 120/42      SaO2: 95%   Pt in recliner. Pt willing to walk. Pt's BP was elevated. Pt stated he felt fine and wanted to try and walk. Got pt ready for walk with new gown. Pt used walker and gait belt was used. Walked pt to sink from recliner, pt stated he felt weak. Asked pt could he continue to walk or needed to sit down. Pt stated he wanted to return to recliner. Once pt got back to recliner, he requested to get back in bed. Pt sat on bedside. Vitals were rechecked. BP dropped to 120/42. Pt denied feeling lightheaded or dizzy, just weak. Put pt back in bed with assistance from Cardiac Rehab RN to help position him in bed. Education was not completed with pt. Pt will need family around for education. Pt stated he would like to go to an assistive living facility. Informed pt that plans for discharge were being addressed to see where he would be going. Call bell and phone were placed within pt's reach.   [phone]  Gary Cerise MS, ACSM CEP  11:42 AM 10/13/2017

## 2017-10-13 NOTE — Clinical Social Work Note (Signed)
Clinical Social Work Assessment  Patient Details  Name: Gary [name] MRN: [id number] Date of Birth: [birth year]  Date of referral:  10/13/17               Reason for consult:  Discharge Planning, Facility Placement                Permission sought to share information with:  Family Supports Permission granted to share information::  Yes, Verbal Permission Granted  Name::     [name]  Agency::     Relationship::  daughter  Contact Information:  [phone]  Housing/Transportation Living arrangements for the past 2 months:  Single Family Home Source of Information:  Adult Children Patient Interpreter Needed:  None Criminal Activity/Legal Involvement Pertinent to Current Situation/Hospitalization:  No - Comment as needed Significant Relationships:  Adult Children, Other Family Members Lives with:  Adult Children Do you feel safe going back to the place where you live?  Yes Need for family participation in patient care:     Care giving concerns:  Patient had several family members at bedside. Patient daughter [name] did assessment with CSW as patient ate lunch. Patient lives at home with adult son but son works during the day and patient is at home by himself. Patient has support from other family members   Social Worker assessment / plan:  Patients daughter [name] stated patient has been at facility in the past and had a good experience. Family stated they would like patient to go to [facility] for rehab. CSW went over the process of rehab and stated patient will need to be evaluated by PT/OT so insurance is able to pay for it. CSW will follow up with family once SNF has made a bed offer   Employment status:  Retired Health and safety inspector:  Medicare PT Recommendations:  Skilled Nursing Facility Information / Referral to community resources:  Skilled Nursing Facility  Patient/Family's Response to care:  Family appreciates CSW role in care  Patient/Family's Understanding of and  Emotional Response to Diagnosis, Current Treatment, and Prognosis:  Family and patient agreeable with discharge plan for patient to receive short term rehab  Emotional Assessment Appearance:  Appears stated age Attitude/Demeanor/Rapport:  Engaged Affect (typically observed):  Accepting Orientation:  Oriented to Self, Oriented to Place, Oriented to  Time, Oriented to Situation Alcohol / Substance use:  Not Applicable Psych involvement (Current and /or in the community):  No (Comment)  Discharge Needs  Concerns to be addressed:  Care Coordination Readmission within the last 30 days:  No Current discharge risk:  Dependent with Mobility Barriers to Discharge:  Continued Medical Work up   [employer], LCSW 10/13/2017, 12:41 PM

## 2017-10-13 NOTE — Progress Notes (Signed)
Progress Note  Patient Name: Gary [name] Date of Encounter: 10/13/2017  Primary Cardiologist: No primary care provider on file.   Subjective   Feeling well.  Tired with exertion.  Inpatient Medications    Scheduled Meds: . aspirin EC  81 mg Oral Daily  . atorvastatin  80 mg Oral q1800  . clopidogrel  75 mg Oral Daily  . fluticasone  1 spray Each Nare Daily  . metoprolol succinate  25 mg Oral Daily  . metoprolol succinate  50 mg Oral Daily  . mirtazapine  7.5 mg Oral QHS  . sodium chloride flush  3 mL Intravenous Q12H  . tamsulosin  0.4 mg Oral Daily   Continuous Infusions: . sodium chloride     PRN Meds: sodium chloride, acetaminophen, loratadine, nitroGLYCERIN, ondansetron (ZOFRAN) IV, sodium chloride flush, traZODone   Vital Signs    Vitals:   10/13/17 0000 10/13/17 0400 10/13/17 1013 10/13/17 1100  BP: 102/61 (!) 123/59 139/65 (!) 175/82  Pulse: 63 70 71 70  Resp: 18 16  18   Temp: (!) 96.7 F (35.9 C) (!) 96.7 F (35.9 C)    TempSrc: Axillary Oral    SpO2: 99% 99%  98%  Weight:      Height:        Intake/Output Summary (Last 24 hours) at 10/13/2017 1304 Last data filed at 10/13/2017 1200 Gross per 24 hour  Intake 250 ml  Output 200 ml  Net 50 ml   Filed Weights   10/10/17 2349 10/11/17 0406 10/12/17 0536  Weight: 47.2 kg 47.2 kg 48.1 kg    Telemetry    Sinus rhythm.  13 beats of SVT.- Personally Reviewed  ECG    N/A- Personally Reviewed  Physical Exam   VS:  BP (!) 120/42 (BP Location: Right Arm)   Pulse 78   Temp (!) 96.7 F (35.9 C) (Oral)   Resp 15   Ht 5\' 8"  (1.727 m)   Wt 48.1 kg   SpO2 98%   BMI 16.12 kg/m  , BMI Body mass index is 16.12 kg/m. GENERAL: Frail, elderly man.  No acute distress HEENT: Pupils equal round and reactive, fundi not visualized, oral mucosa unremarkable NECK:  No jugular venous distention, waveform within normal limits, carotid upstroke brisk and symmetric, no bruits LUNGS:  Clear to  auscultation bilaterally HEART:  RRR.  PMI not displaced or sustained,S1 and S2 within normal limits, no S3, no S4, no clicks, no rubs, no murmurs ABD:  Flat, positive bowel sounds normal in frequency in pitch, no bruits, no rebound, no guarding, no midline pulsatile mass, no hepatomegaly, no splenomegaly EXT:  2 plus pulses throughout, no edema, no cyanosis no clubbing SKIN:  No rashes no nodules.  Multiple ecchymoses NEURO:  Cranial nerves II through XII grossly intact, motor grossly intact throughout [facility]:  Cognitively intact, oriented to person place and time   Labs    Chemistry Recent Labs  Lab 10/10/17 2031 10/11/17 0531 10/12/17 0534 10/13/17 0245  NA 141 138 137 135  K 2.8* 3.0* 3.1* 4.7  CL 100 102 102 105  CO2 21* 22 23 21*  GLUCOSE 73 60* 81 163*  BUN 18 17 14 19   CREATININE 1.79* 1.73* 1.74* 1.95*  CALCIUM 8.6* 8.0* 8.3* 7.8*  PROT 5.1*  --   --   --   ALBUMIN 2.7*  --   --   --   AST 35  --   --   --   ALT 18  --   --   --  ALKPHOS 31*  --   --   --   BILITOT 1.5*  --   --   --   GFRNONAA 34* 36* 35* 31*  GFRAA 40* 41* 41* 36*  ANIONGAP 20* 14 12 9      Hematology Recent Labs  Lab 10/11/17 1547 10/12/17 0534 10/13/17 0245  WBC 4.8 7.6 6.3  RBC 3.22* 3.19* 3.22*  HGB 9.4* 9.4* 9.3*  HCT 28.7* 28.6* 28.7*  MCV 89.1 89.7 89.1  MCH 29.2 29.5 28.9  MCHC 32.8 32.9 32.4  RDW 14.6 14.5 14.7  PLT 153 149* 167    Cardiac Enzymes Recent Labs  Lab 10/11/17 0012 10/11/17 0531 10/11/17 1137  TROPONINI 0.91* 1.06* 0.98*    Recent Labs  Lab 10/10/17 2039  TROPIPOC 0.81*     BNPNo results for input(s): BNP, PROBNP in the last 168 hours.   DDimer No results for input(s): DDIMER in the last 168 hours.   Radiology    Nm Myocar Multi W/spect W/wall Motion / Ef  Result Date: 10/11/2017  There was no ST segment deviation noted during stress.  Findings consistent with prior myocardial infarction with peri-infarct ischemia.  The left ventricular  ejection fraction is moderately decreased (30-44%).  1. EF 33%, diffuse hypokinesis. 2. Fixed large, severe basal to apical inferior and inferolateral perfusion defect suggestive of prior infarction. Partially reversible medium-sized, mild mid to apical anteroseptal/anterior perfusion defect suggesting infarction with peri-infarct ischemia. Intermediate risk study.  Moderately decreased systolic function.  Inferior/inferolateral infarction.  Mid to apical anteroseptal relatively mild defect that is partially reversible, may be evidence for infarction with peri-infarct ischemia.    Cardiac Studies   Echo 10/12/17: Study Conclusions  - Left ventricle: The cavity size was normal. Wall thickness was   increased in a pattern of moderate LVH. Systolic function was   normal. The estimated ejection fraction was in the range of 55%   to 60%. Wall motion was normal; there were no regional wall   motion abnormalities. Doppler parameters are consistent with   abnormal left ventricular relaxation (grade 1 diastolic   dysfunction). - Aortic valve: There was no stenosis. - Mitral valve: Moderately calcified annulus. There was mild   regurgitation. - Left atrium: The atrium was moderately dilated. - Right ventricle: The cavity size was normal. Systolic function   was normal. - Pulmonary arteries: No complete TR doppler jet so unable to   estimate PA systolic pressure. - Inferior vena cava: The vessel was normal in size. The   respirophasic diameter changes were in the normal range (>= 50%),   consistent with normal central venous pressure. - Pericardium, extracardiac: A trivial pericardial effusion was   identified.  Impressions:  - Normal LV size with moderate LV hypertrophy. EF 55-60%. Normal RV   size and systolic function. Mild mitral regurgitation.  LHC 10/12/17:  Mid LM to Dist LM lesion is 40% stenosed.  Ost LAD lesion is 50% stenosed.  Prox Cx to Mid Cx lesion is 60% stenosed.  Ost  1st Diag lesion is 30% stenosed.  Ost 2nd Diag lesion is 90% stenosed.   1.  Heavily calcified coronary arteries with mild to moderate calcific stenosis of the mid circumflex, ostial LAD, and left main. 2.  Severe stenosis of the diagonal branch of the LAD. 3.  Moderately elevated LVEDP  Recommendations: Medical therapy.  No targets for PCI.  Patient is not a candidate for surgical revascularization. Majority of CAD is nonobstructive.   Patient Profile  82 y.o. male with CAD, PAD status post left femoral popliteal bypass grafting, hypertension, COPD, alcohol abuse, and hyperlipidemia here with NSTEMI and abnormal stress test.  Assessment & Plan    # NSTEMI: # Abnormal stress test: Cath revealed mostly non-obstructive CAD.  However he did have a 90% ostial D2 lesion that was not amenable to PCI.  He has a 60% mid left circumflex lesion as well.  Medical therapy was recommended.  Echocardiogram revealed normal systolic function.  Continue aspirin and clopidogrel.  Metoprolol has been increased to 50mg .  Atorvastatin was increased to 80mg .  We will add isosorbide mononitrate.  Of note, his family reports that he has not been taking his medications at home.  Will monitor blood pressure closely and plan to repeat lipids and CMP in 6 to 8 weeks.  # Hypertension: Blood pressure has been labile.  Increasing metoprolol and adding isosorbide mononitrate as above.  # PAD: Continue aspirin, statin, clopidogrel, and metoprolol.  # Hyperlipidemia: Increasing statin as above.  Repeat lipids and CMP in 6 to 8 weeks.  # Acute on chronic renal failure: Creatinine up to 1.95 from 1.7 previously.  Avoid nephrotoxic agents and continue to monitor for now.  # SVT: Asymptomatic.  Supplement magnesium and increase metoprolol.        For questions or updates, please contact [facility] Please consult [email or web address] for contact info under        Signed, Chilton Si, MD  10/13/2017, 1:04  PM

## 2017-10-14 DIAGNOSIS — I2510 Atherosclerotic heart disease of native coronary artery without angina pectoris: Secondary | ICD-10-CM

## 2017-10-14 LAB — CBC
HCT: 25.9 % — ABNORMAL LOW (ref 39.0–52.0)
Hemoglobin: 8.4 g/dL — ABNORMAL LOW (ref 13.0–17.0)
MCH: 29.7 pg (ref 26.0–34.0)
MCHC: 32.4 g/dL (ref 30.0–36.0)
MCV: 91.5 fL (ref 78.0–100.0)
PLATELETS: 165 10*3/uL (ref 150–400)
RBC: 2.83 MIL/uL — ABNORMAL LOW (ref 4.22–5.81)
RDW: 15 % (ref 11.5–15.5)
WBC: 8.2 10*3/uL (ref 4.0–10.5)

## 2017-10-14 LAB — BASIC METABOLIC PANEL
ANION GAP: 10 (ref 5–15)
BUN: 30 mg/dL — ABNORMAL HIGH (ref 8–23)
CALCIUM: 7.8 mg/dL — AB (ref 8.9–10.3)
CO2: 20 mmol/L — ABNORMAL LOW (ref 22–32)
CREATININE: 2.19 mg/dL — AB (ref 0.61–1.24)
Chloride: 103 mmol/L (ref 98–111)
GFR calc Af Amer: 31 mL/min — ABNORMAL LOW (ref >60)
GFR, EST NON AFRICAN AMERICAN: 27 mL/min — AB (ref >60)
GLUCOSE: 100 mg/dL — AB (ref 70–99)
Potassium: 4.1 mmol/L (ref 3.5–5.1)
Sodium: 133 mmol/L — ABNORMAL LOW (ref 135–145)

## 2017-10-14 NOTE — Progress Notes (Signed)
Got notified by CCM at 10:30 Pt had short SVT, HR 170 bpm. Pt was asymptomatic, BP stable.EKG turned back quickly to NSR, HR 70s-80s. Will monitor.  Filiberto Pinks, RN

## 2017-10-14 NOTE — Progress Notes (Signed)
Progress Note  Patient Name: Gary [name] Date of Encounter: 10/14/2017  Primary Cardiologist: Nanetta Batty, MD   Subjective   Feeling well.  No chest pain or shortness of breath.  His only complaints are cold food and not receiving good coffee.  He also does not care for a low-fat diet and states that he is going to eat whatever he wants when he leaves.  Inpatient Medications    Scheduled Meds: . aspirin EC  81 mg Oral Daily  . atorvastatin  80 mg Oral q1800  . clopidogrel  75 mg Oral Daily  . fluticasone  1 spray Each Nare Daily  . isosorbide mononitrate  15 mg Oral Daily  . metoprolol succinate  50 mg Oral Daily  . mirtazapine  7.5 mg Oral QHS  . sodium chloride flush  3 mL Intravenous Q12H  . tamsulosin  0.4 mg Oral Daily   Continuous Infusions: . sodium chloride     PRN Meds: sodium chloride, acetaminophen, loratadine, nitroGLYCERIN, ondansetron (ZOFRAN) IV, sodium chloride flush, traZODone   Vital Signs    Vitals:   10/14/17 0335 10/14/17 0843 10/14/17 1031 10/14/17 1033  BP: (!) 102/45 (!) 154/76  136/69  Pulse: 73 77 75 75  Resp: (!) 29 (!) 25 (!) 24 (!) 25  Temp: (!) 97.4 F (36.3 C) (!) 97.3 F (36.3 C)    TempSrc: Oral Oral    SpO2: 99% 100% 98% 100%  Weight: 50.6 kg     Height:        Intake/Output Summary (Last 24 hours) at 10/14/2017 1112 Last data filed at 10/14/2017 0355 Gross per 24 hour  Intake 850 ml  Output 100 ml  Net 750 ml   Filed Weights   10/11/17 0406 10/12/17 0536 10/14/17 0335  Weight: 47.2 kg 48.1 kg 50.6 kg    Telemetry    Sinus rhythm.  - Personally Reviewed  ECG    N/A- Personally Reviewed  Physical Exam   VS:  BP 136/69 (BP Location: Right Arm)   Pulse 75   Temp (!) 97.3 F (36.3 C) (Oral)   Resp (!) 25   Ht 5\' 8"  (1.727 m)   Wt 50.6 kg   SpO2 100%   BMI 16.96 kg/m  , BMI Body mass index is 16.96 kg/m. GENERAL: Frail, elderly man.  No acute distress HEENT: Pupils equal round and reactive, fundi  not visualized, oral mucosa unremarkable NECK:  No jugular venous distention, waveform within normal limits, carotid upstroke brisk and symmetric, no bruits LUNGS:  Clear to auscultation bilaterally HEART:  RRR.  PMI not displaced or sustained,S1 and S2 within normal limits, no S3, no S4, no clicks, no rubs, no murmurs ABD:  Flat, positive bowel sounds normal in frequency in pitch, no bruits, no rebound, no guarding, no midline pulsatile mass, no hepatomegaly, no splenomegaly EXT:  2 plus pulses throughout, no edema, no cyanosis no clubbing SKIN:  No rashes no nodules.  Multiple ecchymoses NEURO:  Cranial nerves II through XII grossly intact, motor grossly intact throughout [facility]:  Cognitively intact, oriented to person place and time   Labs    Chemistry Recent Labs  Lab 10/10/17 2031 10/11/17 0531 10/12/17 0534 10/13/17 0245  NA 141 138 137 135  K 2.8* 3.0* 3.1* 4.7  CL 100 102 102 105  CO2 21* 22 23 21*  GLUCOSE 73 60* 81 163*  BUN 18 17 14 19   CREATININE 1.79* 1.73* 1.74* 1.95*  CALCIUM 8.6* 8.0* 8.3* 7.8*  PROT 5.1*  --   --   --   ALBUMIN 2.7*  --   --   --   AST 35  --   --   --   ALT 18  --   --   --   ALKPHOS 31*  --   --   --   BILITOT 1.5*  --   --   --   GFRNONAA 34* 36* 35* 31*  GFRAA 40* 41* 41* 36*  ANIONGAP 20* 14 12 9      Hematology Recent Labs  Lab 10/11/17 1547 10/12/17 0534 10/13/17 0245  WBC 4.8 7.6 6.3  RBC 3.22* 3.19* 3.22*  HGB 9.4* 9.4* 9.3*  HCT 28.7* 28.6* 28.7*  MCV 89.1 89.7 89.1  MCH 29.2 29.5 28.9  MCHC 32.8 32.9 32.4  RDW 14.6 14.5 14.7  PLT 153 149* 167    Cardiac Enzymes Recent Labs  Lab 10/11/17 0012 10/11/17 0531 10/11/17 1137  TROPONINI 0.91* 1.06* 0.98*    Recent Labs  Lab 10/10/17 2039  TROPIPOC 0.81*     BNPNo results for input(s): BNP, PROBNP in the last 168 hours.   DDimer No results for input(s): DDIMER in the last 168 hours.   Radiology    No results found.  Cardiac Studies   Echo 10/12/17: Study  Conclusions  - Left ventricle: The cavity size was normal. Wall thickness was   increased in a pattern of moderate LVH. Systolic function was   normal. The estimated ejection fraction was in the range of 55%   to 60%. Wall motion was normal; there were no regional wall   motion abnormalities. Doppler parameters are consistent with   abnormal left ventricular relaxation (grade 1 diastolic   dysfunction). - Aortic valve: There was no stenosis. - Mitral valve: Moderately calcified annulus. There was mild   regurgitation. - Left atrium: The atrium was moderately dilated. - Right ventricle: The cavity size was normal. Systolic function   was normal. - Pulmonary arteries: No complete TR doppler jet so unable to   estimate PA systolic pressure. - Inferior vena cava: The vessel was normal in size. The   respirophasic diameter changes were in the normal range (>= 50%),   consistent with normal central venous pressure. - Pericardium, extracardiac: A trivial pericardial effusion was   identified.  Impressions:  - Normal LV size with moderate LV hypertrophy. EF 55-60%. Normal RV   size and systolic function. Mild mitral regurgitation.  LHC 10/12/17:  Mid LM to Dist LM lesion is 40% stenosed.  Ost LAD lesion is 50% stenosed.  Prox Cx to Mid Cx lesion is 60% stenosed.  Ost 1st Diag lesion is 30% stenosed.  Ost 2nd Diag lesion is 90% stenosed.   1.  Heavily calcified coronary arteries with mild to moderate calcific stenosis of the mid circumflex, ostial LAD, and left main. 2.  Severe stenosis of the diagonal branch of the LAD. 3.  Moderately elevated LVEDP  Recommendations: Medical therapy.  No targets for PCI.  Patient is not a candidate for surgical revascularization. Majority of CAD is nonobstructive.   Patient Profile     82 y.o. male with CAD, PAD status post left femoral popliteal bypass grafting, hypertension, COPD, alcohol abuse, and hyperlipidemia here with NSTEMI and  abnormal stress test.  Assessment & Plan    # NSTEMI: # Abnormal stress test: Cath revealed mostly non-obstructive CAD.  However he did have a 90% ostial D2 lesion that was not amenable to PCI.  He has a 60% mid left circumflex lesion as well.  Medical therapy was recommended.  Echocardiogram revealed normal systolic function.  Continue aspirin and clopidogrel.  Metoprolol has been increased to 50mg .  Atorvastatin was increased to 80mg .  Added isosorbide mononitrate 15 mg daily.  Of note, his family reports that he has not been taking his medications at home.  Will monitor blood pressure closely and plan to repeat lipids and CMP in 6 to 8 weeks.  # Hypertension: Blood pressure remains very labile.  Metoprolol was increased and his blood pressure has ranged from 92 systolic to 154.  Continue metoprolol and Imdur.  # PAD: Continue aspirin, statin, clopidogrel, and metoprolol.  # Hyperlipidemia: Increased statin as above.  Repeat lipids and CMP in 6 to 8 weeks.  # Acute on chronic renal failure: Creatinine up to 1.95 from 1.7 previously.  BMP today is pending.  Avoid nephrotoxic agents and continue to monitor for now.  # SVT: Asymptomatic.  Supplement magnesium and increase metoprolol.       For questions or updates, please contact [facility] Please consult [email or web address] for contact info under        Signed, Chilton Si, MD  10/14/2017, 11:12 AM

## 2017-10-15 ENCOUNTER — Encounter (HOSPITAL_COMMUNITY): Payer: Self-pay | Admitting: Cardiovascular Disease

## 2017-10-15 DIAGNOSIS — N184 Chronic kidney disease, stage 4 (severe): Secondary | ICD-10-CM

## 2017-10-15 LAB — CBC
HEMATOCRIT: 25 % — AB (ref 39.0–52.0)
HEMOGLOBIN: 7.9 g/dL — AB (ref 13.0–17.0)
MCH: 28.8 pg (ref 26.0–34.0)
MCHC: 31.6 g/dL (ref 30.0–36.0)
MCV: 91.2 fL (ref 78.0–100.0)
Platelets: 137 10*3/uL — ABNORMAL LOW (ref 150–400)
RBC: 2.74 MIL/uL — ABNORMAL LOW (ref 4.22–5.81)
RDW: 15.1 % (ref 11.5–15.5)
WBC: 5.1 10*3/uL (ref 4.0–10.5)

## 2017-10-15 LAB — BASIC METABOLIC PANEL
Anion gap: 9 (ref 5–15)
Anion gap: 9 (ref 5–15)
BUN: 29 mg/dL — AB (ref 8–23)
BUN: 30 mg/dL — AB (ref 8–23)
CHLORIDE: 103 mmol/L (ref 98–111)
CHLORIDE: 100 mmol/L (ref 98–111)
CO2: 22 mmol/L (ref 22–32)
CO2: 23 mmol/L (ref 22–32)
Calcium: 7.6 mg/dL — ABNORMAL LOW (ref 8.9–10.3)
Calcium: 7.8 mg/dL — ABNORMAL LOW (ref 8.9–10.3)
Creatinine, Ser: 2.15 mg/dL — ABNORMAL HIGH (ref 0.61–1.24)
Creatinine, Ser: 2.38 mg/dL — ABNORMAL HIGH (ref 0.61–1.24)
GFR calc Af Amer: 28 mL/min — ABNORMAL LOW (ref >60)
GFR calc non Af Amer: 27 mL/min — ABNORMAL LOW (ref >60)
GFR calc non Af Amer: 24 mL/min — ABNORMAL LOW (ref >60)
GFR, EST AFRICAN AMERICAN: 32 mL/min — AB (ref >60)
GLUCOSE: 84 mg/dL (ref 70–99)
Glucose, Bld: 114 mg/dL — ABNORMAL HIGH (ref 70–99)
Potassium: 4.1 mmol/L (ref 3.5–5.1)
Potassium: 4.1 mmol/L (ref 3.5–5.1)
Sodium: 134 mmol/L — ABNORMAL LOW (ref 135–145)
Sodium: 132 mmol/L — ABNORMAL LOW (ref 135–145)

## 2017-10-15 LAB — MAGNESIUM: Magnesium: 1.5 mg/dL — ABNORMAL LOW (ref 1.7–2.4)

## 2017-10-15 MED ORDER — GUAIFENESIN ER 600 MG PO TB12: 600.0000 mg | ORAL_TABLET | Freq: Two times a day (BID) | ORAL | Status: DC | PRN

## 2017-10-15 MED ORDER — MAGNESIUM SULFATE 2 GM/50ML IV SOLN
2.0000 g | Freq: Once | INTRAVENOUS | Status: AC
  Administered 2017-10-15: 2 g via INTRAVENOUS
  Filled 2017-10-15: qty 50

## 2017-10-15 NOTE — Evaluation (Signed)
Physical Therapy Evaluation Patient Details Name: Gary [name] MRN: [id number] DOB: 1935/06/18 Today's Date: 10/15/2017   History of Present Illness  Gary [name] is a 82 y.o. male with history of high blood pressure, peripheral vascular disease who presented after he fell picking up his remote in the morning and couldn't get up. PMH: alcohol abuse, CAD, COPD, HTN, PVD. S/p failed myoview pt underwent a L heart cath and Cornoary angiography on 9/6.  Clinical Impression  Pt admitted with above. Pt was functioning mod I prior to admit now requiring min/modA for mobility and ambulation. Pt unsafe to return home at this time. Pt to benefit from ST-SNF upon d/c to achieve safe mod I level of function for safe transition home with son.    Follow Up Recommendations SNF;Supervision/Assistance - 24 hour    Equipment Recommendations  None recommended by PT    Recommendations for Other Services       Precautions / Restrictions Precautions Precautions: Fall Restrictions Weight Bearing Restrictions: No      Mobility  Bed Mobility Overal bed mobility: Needs Assistance Bed Mobility: Supine to Sit     Supine to sit: Mod assist     General bed mobility comments: pt slow to move, able to bring LEs off EOB, modA to bring hips to EOB and elevate trunk  Transfers Overall transfer level: Needs assistance Equipment used: Rolling walker (2 wheeled) Transfers: Sit to/from Stand Sit to Stand: Mod assist         General transfer comment: modA to power up off EOB and steady during transition of hands from EOB to RW  Ambulation/Gait Ambulation/Gait assistance: Mod assist Gait Distance (Feet): 15 Feet Assistive device: Rolling walker (2 wheeled) Gait Pattern/deviations: Step-to pattern;Decreased stride length;Shuffle;Narrow base of support Gait velocity: slow   General Gait Details: pt with increased trunk flexion, modA for walker management around bed, pt with minimal  foot clearance and retropulsion  Stairs            Wheelchair Mobility    Modified Rankin (Stroke Patients Only)       Balance Overall balance assessment: Needs assistance Sitting-balance support: Feet supported;No upper extremity supported Sitting balance-Leahy Scale: Poor Sitting balance - Comments: dependent on RW Postural control: Posterior lean                                   Pertinent Vitals/Pain Pain Assessment: No/denies pain    Home Living Family/patient expects to be discharged to:: Skilled nursing facility Living Arrangements: (lives with son who works during the day)                    Prior Function Level of Independence: Independent               Hand Dominance   Dominant Hand: Right    Extremity/Trunk Assessment   Upper Extremity Assessment Upper Extremity Assessment: Generalized weakness    Lower Extremity Assessment Lower Extremity Assessment: Generalized weakness    Cervical / Trunk Assessment Cervical / Trunk Assessment: Kyphotic  Communication   Communication: HOH  Cognition Arousal/Alertness: Awake/alert Behavior During Therapy: Flat affect Overall Cognitive Status: No family/caregiver present to determine baseline cognitive functioning                                 General Comments: pt slow to respond but  appropriate when answering questions      General Comments General comments (skin integrity, edema, etc.): pt with wounds on bilat feet, dressings on them    Exercises     Assessment/Plan    PT Assessment Patient needs continued PT services  PT Problem List Decreased strength;Decreased range of motion;Decreased activity tolerance;Decreased balance;Decreased mobility;Decreased coordination;Decreased cognition;Decreased knowledge of use of DME;Decreased safety awareness       PT Treatment Interventions DME instruction;Gait training;Stair training;Functional mobility  training;Therapeutic activities;Therapeutic exercise;Balance training;Neuromuscular re-education    PT Goals (Current goals can be found in the Care Plan section)  Acute Rehab PT Goals Patient Stated Goal: didn't state PT Goal Formulation: With patient Time For Goal Achievement: 10/29/17 Potential to Achieve Goals: Good    Frequency Min 3X/week   Barriers to discharge Decreased caregiver support home alone during day    Co-evaluation               AM-PAC PT "6 Clicks" Daily Activity  Outcome Measure Difficulty turning over in bed (including adjusting bedclothes, sheets and blankets)?: Unable Difficulty moving from lying on back to sitting on the side of the bed? : Unable Difficulty sitting down on and standing up from a chair with arms (e.g., wheelchair, bedside commode, etc,.)?: Unable Help needed moving to and from a bed to chair (including a wheelchair)?: A Lot Help needed walking in hospital room?: A Lot Help needed climbing 3-5 steps with a railing? : A Lot 6 Click Score: 9    End of Session Equipment Utilized During Treatment: Gait belt Activity Tolerance: Patient tolerated treatment well Patient left: in chair;with call bell/phone within reach;with chair alarm set Nurse Communication: Mobility status PT Visit Diagnosis: Unsteadiness on feet (R26.81)    Time: 4098-1191 PT Time Calculation (min) (ACUTE ONLY): 18 min   Charges:   PT Evaluation $PT Eval Moderate Complexity: 1 Mod          Lewis Shock, PT, DPT Acute Rehabilitation Services Pager #: [phone] Office #: [phone]   Iona Hansen 10/15/2017, 2:06 PM

## 2017-10-15 NOTE — NC FL2 (Signed)
[place] MEDICAID FL2 LEVEL OF CARE SCREENING TOOL     IDENTIFICATION  Patient Name: Gary [name] Birthdate: September 23, 1935 Sex: male Admission Date (Current Location): 10/10/2017  [facility] and [place] Number:  Producer, television/film/video and Address:  [facility] [place]. [facility], [address], [place], [place] [address]      Provider Number: [id number]  Attending Physician Name and Address:  Hulan Fray, MD  Relative Name and Phone Number:  [name] [phone]    Current Level of Care: Hospital Recommended Level of Care: Skilled Nursing Facility Prior Approval Number:    Date Approved/Denied:   PASRR Number: [id number]  Discharge Plan: SNF    Current Diagnoses: Patient Active Problem List   Diagnosis Date Noted  . Stage 4 chronic kidney disease (HCC)   . Pressure injury of skin 10/11/2017  . NSTEMI (non-ST elevated myocardial infarction) (HCC) 10/10/2017  . Malnutrition of moderate degree (HCC) 09/25/2017  . Alcohol abuse 04/18/2015  . COPD (chronic obstructive pulmonary disease) (HCC) 04/18/2015  . Insomnia 10/31/2014  . BPH (benign prostatic hyperplasia) 10/31/2014  . Carotid artery disease (HCC) 04/29/2013  . HLD (hyperlipidemia) 06/25/2012  . GERD (gastroesophageal reflux disease) 04/28/2011  . PVD, LFBPG 2004, dopplers show progression of disease bliat 04/27/2011  . HTN  04/27/2011    Orientation RESPIRATION BLADDER Height & Weight     Self, Time, Situation, Place  Normal Continent Weight: 112 lb 4.8 oz (50.9 kg) Height:  5\' 8"  (172.7 cm)  BEHAVIORAL SYMPTOMS/MOOD NEUROLOGICAL BOWEL NUTRITION STATUS      Continent Diet(Heart healthy)  AMBULATORY STATUS COMMUNICATION OF NEEDS Skin   Limited Assist Verbally Normal                       Personal Care Assistance Level of Assistance  Bathing, Feeding, Dressing Bathing Assistance: Limited assistance Feeding assistance: Independent Dressing Assistance: Limited assistance      Functional Limitations Info  Sight, Hearing, Speech Sight Info: Adequate Hearing Info: Adequate Speech Info: Adequate    SPECIAL CARE FACTORS FREQUENCY  PT (By licensed PT), OT (By licensed OT)     PT Frequency: 5x wk OT Frequency: 5x wk            Contractures Contractures Info: Not present    Additional Factors Info  Code Status, Allergies Code Status Info: DNR Allergies Info: CONTRAST MEDIA IODINATED DIAGNOSTIC AGENTS, DIOVAN VALSARTAN, NEOMYCIN, NEOSPORIN NEOMYCIN-BACITRACIN ZN-POLYMYX            Current Medications (10/15/2017):  This is the current hospital active medication list Current Facility-Administered Medications  Medication Dose Route Frequency Provider Last Rate Last Dose  . 0.9 %  sodium chloride infusion  250 mL Intravenous PRN Tonny Bollman, MD      . acetaminophen (TYLENOL) tablet 500 mg  500 mg Oral Q4H PRN Hulan Fray, MD   500 mg at 10/15/17 0129  . aspirin EC tablet 81 mg  81 mg Oral Daily Hulan Fray, MD   81 mg at 10/15/17 1018  . atorvastatin (LIPITOR) tablet 80 mg  80 mg Oral q1800 Hulan Fray, MD   80 mg at 10/14/17 1746  . clopidogrel (PLAVIX) tablet 75 mg  75 mg Oral Daily Hulan Fray, MD   75 mg at 10/15/17 1012  . fluticasone (FLONASE) 50 MCG/ACT nasal spray 1 spray  1 spray Each Nare Daily Hulan Fray, MD   1 spray at 10/15/17 1020  . guaiFENesin (MUCINEX) 12 hr tablet 600 mg  600 mg Oral BID PRN Marcelino Duster, PA      . isosorbide mononitrate (IMDUR) 24 hr tablet 15 mg  15 mg Oral Daily Chilton Si, MD   15 mg at 10/15/17 1012  . loratadine (CLARITIN) tablet 10 mg  10 mg Oral Daily PRN Hulan Fray, MD      . metoprolol succinate (TOPROL-XL) 24 hr tablet 50 mg  50 mg Oral Daily Runell Gess, MD   50 mg at 10/14/17 1028  . nitroGLYCERIN (NITROSTAT) SL tablet 0.4 mg  0.4 mg Sublingual Q5 Min x 3 PRN Hulan Fray, MD      . ondansetron [facility]) injection 4 mg  4 mg Intravenous Q6H PRN  Tonny Bollman, MD      . sodium chloride flush (NS) 0.9 % injection 3 mL  3 mL Intravenous Q12H Tonny Bollman, MD   3 mL at 10/15/17 1020  . sodium chloride flush (NS) 0.9 % injection 3 mL  3 mL Intravenous PRN Tonny Bollman, MD      . tamsulosin [facility]) capsule 0.4 mg  0.4 mg Oral Daily Hulan Fray, MD   0.4 mg at 10/15/17 1017  . traZODone (DESYREL) tablet 25-50 mg  25-50 mg Oral QHS PRN Hulan Fray, MD   50 mg at 10/15/17 0129     Discharge Medications: Please see discharge summary for a list of discharge medications.  Relevant Imaging Results:  Relevant Lab Results:   Additional Information SS# [id number]  Althea Charon, LCSW

## 2017-10-15 NOTE — Progress Notes (Signed)
Pt complains about left heel pain, on assessment, the heel is red but blanchable. Pink foam applied and heel elevated off the bed/ will continue to monitor.

## 2017-10-15 NOTE — Care Management Important Message (Signed)
Important Message  Patient Details  Name: Gary [name] MRN: [id number] Date of Birth: [birth year]   Medicare Important Message Given:  Yes    Harriett Azar P Taveon Enyeart 10/15/2017, 2:30 PM

## 2017-10-15 NOTE — Progress Notes (Addendum)
Progress Note  Patient Name: Gary [name] Date of Encounter: 10/15/2017  Primary Cardiologist: Nanetta Batty, MD   Subjective   Pt only complaint is about coffee.  Inpatient Medications    Scheduled Meds: . aspirin EC  81 mg Oral Daily  . atorvastatin  80 mg Oral q1800  . clopidogrel  75 mg Oral Daily  . fluticasone  1 spray Each Nare Daily  . isosorbide mononitrate  15 mg Oral Daily  . metoprolol succinate  50 mg Oral Daily  . mirtazapine  7.5 mg Oral QHS  . sodium chloride flush  3 mL Intravenous Q12H  . tamsulosin  0.4 mg Oral Daily   Continuous Infusions: . sodium chloride     PRN Meds: sodium chloride, acetaminophen, loratadine, nitroGLYCERIN, ondansetron (ZOFRAN) IV, sodium chloride flush, traZODone   Vital Signs    Vitals:   10/14/17 1709 10/14/17 2030 10/14/17 2241 10/15/17 0545  BP: 137/64 (!) 127/54 (!) 107/47   Pulse:      Resp: (!) 23 18    Temp: (!) 97.4 F (36.3 C)  97.7 F (36.5 C) 97.8 F (36.6 C)  TempSrc: Oral  Oral Oral  SpO2: 98%  98% 99%  Weight:    50.9 kg  Height:        Intake/Output Summary (Last 24 hours) at 10/15/2017 0925 Last data filed at 10/15/2017 0546 Gross per 24 hour  Intake 610 ml  Output 190 ml  Net 420 ml   Filed Weights   10/12/17 0536 10/14/17 0335 10/15/17 0545  Weight: 48.1 kg 50.6 kg 50.9 kg    Telemetry    No new tracings - Personally Reviewed  ECG    No new tracings - Personally Reviewed  Physical Exam   GEN: No acute distress.   Neck: No JVD Cardiac: RRR, no murmurs, rubs, or gallops.  Respiratory: Clear to auscultation bilaterally. GI: Soft, nontender, non-distended  MS: No edema; No deformity. Neuro:  Nonfocal  Psych: Normal affect   Labs    Chemistry Recent Labs  Lab 10/10/17 2031  10/13/17 0245 10/14/17 1057 10/15/17 0523  NA 141   < > 135 133* 134*  K 2.8*   < > 4.7 4.1 4.1  CL 100   < > 105 103 103  CO2 21*   < > 21* 20* 22  GLUCOSE 73   < > 163* 100* 84  BUN 18    < > 19 30* 29*  CREATININE 1.79*   < > 1.95* 2.19* 2.15*  CALCIUM 8.6*   < > 7.8* 7.8* 7.6*  PROT 5.1*  --   --   --   --   ALBUMIN 2.7*  --   --   --   --   AST 35  --   --   --   --   ALT 18  --   --   --   --   ALKPHOS 31*  --   --   --   --   BILITOT 1.5*  --   --   --   --   GFRNONAA 34*   < > 31* 27* 27*  GFRAA 40*   < > 36* 31* 32*  ANIONGAP 20*   < > 9 10 9    < > = values in this interval not displayed.     Hematology Recent Labs  Lab 10/12/17 0534 10/13/17 0245 10/14/17 1057  WBC 7.6 6.3 8.2  RBC 3.19* 3.22* 2.83*  HGB  9.4* 9.3* 8.4*  HCT 28.6* 28.7* 25.9*  MCV 89.7 89.1 91.5  MCH 29.5 28.9 29.7  MCHC 32.9 32.4 32.4  RDW 14.5 14.7 15.0  PLT 149* 167 165    Cardiac Enzymes Recent Labs  Lab 10/11/17 0012 10/11/17 0531 10/11/17 1137  TROPONINI 0.91* 1.06* 0.98*    Recent Labs  Lab 10/10/17 2039  TROPIPOC 0.81*     BNPNo results for input(s): BNP, PROBNP in the last 168 hours.   DDimer No results for input(s): DDIMER in the last 168 hours.   Radiology    No results found.  Cardiac Studies   Echo 10/12/17: Study Conclusions - Left ventricle: The cavity size was normal. Wall thickness was increased in a pattern of moderate LVH. Systolic function was normal. The estimated ejection fraction was in the range of 55% to 60%. Wall motion was normal; there were no regional wall motion abnormalities. Doppler parameters are consistent with abnormal left ventricular relaxation (grade 1 diastolic dysfunction). - Aortic valve: There was no stenosis. - Mitral valve: Moderately calcified annulus. There was mild regurgitation. - Left atrium: The atrium was moderately dilated. - Right ventricle: The cavity size was normal. Systolic function was normal. - Pulmonary arteries: No complete TR doppler jet so unable to estimate PA systolic pressure. - Inferior vena cava: The vessel was normal in size. The respirophasic diameter changes were in  the normal range (>= 50%), consistent with normal central venous pressure. - Pericardium, extracardiac: A trivial pericardial effusion was identified.  Impressions: - Normal LV size with moderate LV hypertrophy. EF 55-60%. Normal RV size and systolic function. Mild mitral regurgitation.   LHC 10/12/17:  Mid LM to Dist LM lesion is 40% stenosed.  Ost LAD lesion is 50% stenosed.  Prox Cx to Mid Cx lesion is 60% stenosed.  Ost 1st Diag lesion is 30% stenosed.  Ost 2nd Diag lesion is 90% stenosed.  1. Heavily calcified coronary arteries with mild to moderate calcific stenosis of the mid circumflex, ostial LAD, and left main. 2. Severe stenosis of the diagonal branch of the LAD. 3. Moderately elevated LVEDP  Recommendations: Medical therapy. No targets for PCI. Patient is not a candidate for surgical revascularization.Majority of CAD is nonobstructive.  Patient Profile     82 y.o. male with CAD, PAD status post left femoral popliteal bypass grafting, hypertension, COPD, alcohol abuse, and hyperlipidemia here with NSTEMI and abnormal stress test.  Assessment & Plan    1. NSTEMI, abnormal stress test, CAD - heart cath with non-obstructive CAD, except 90% ostial D2 lesion not amenable to PCI - medical therapy recommended - continue ASA and plavix - lopressor at 50 mg BID, continue lipitor 80 mg, added imdur yesterday - pressures well-controlled - Hb yesterday 8.4 (9.3), will repeat CBC today - pt denies chest pain   2. Chronic diastolic heart failure - euvolemic on exam   3. PAD - continue ASA, plavix, and statin   4. HLD - on lipitor 80 mg - tricor was D/C'ed on admission, will not restart at this time   5. Acute on chronic renal failure stage III - baseline may be near 1.25 - creatinine leveling off at 2.15 (2.19) - will recheck BMP this afternoon   May consider discharge today if Hb stable and creatinine is trending down. Will discuss with Dr.  Eldridge Dace.       For questions or updates, please contact [facility] Please consult [email or web address] for contact info under  Signed, Roe Rutherford Duke, PA  10/15/2017, 9:25 AM    I have examined the patient and reviewed assessment and plan and discussed with patient.  Agree with above as stated.  Medical therapy for CAD.  RIght groin site is without hematoma.    He states he is going to eat what he wants when he leaves the hospital, so I have given him a regular diet.  Continue metoprolol for SVT as well.   He wants to go home, but Hbg continues to drop.  Blood loss does not appear to be from his groin cath.  Would investigate other causes.   Could also stop aspirin and continue clopidogrel monotherapy.   Lance Muss

## 2017-10-15 NOTE — Progress Notes (Signed)
CARDIAC REHAB PHASE I   PRE:  Rate/Rhythm: 81 SR with BBB    BP: sitting in bathroom    SaO2:   MODE:  Ambulation: 42 ft in hall   POST:  Rate/Rhythm: 92 SR    BP: sitting 159/70     SaO2: 97 RA  Pt had BM in BR then walked in hall. Slow pace with RW, assist x2 for safety. To recliner, no c/o CP. He is quite frail and does not seem able to be alone (asked me to set up everything on tray). He lives with son who works. Pt needs PT c/s as family and staff have been planning SNF. Notified RN.  [phone]  Harriet Masson CES, ACSM 10/15/2017 9:15 AM

## 2017-10-16 DIAGNOSIS — I480 Paroxysmal atrial fibrillation: Secondary | ICD-10-CM

## 2017-10-16 LAB — BASIC METABOLIC PANEL WITH GFR
Anion gap: 10 (ref 5–15)
BUN: 32 mg/dL — ABNORMAL HIGH (ref 8–23)
CO2: 19 mmol/L — ABNORMAL LOW (ref 22–32)
Calcium: 7.6 mg/dL — ABNORMAL LOW (ref 8.9–10.3)
Chloride: 102 mmol/L (ref 98–111)
Creatinine, Ser: 2.21 mg/dL — ABNORMAL HIGH (ref 0.61–1.24)
GFR calc Af Amer: 31 mL/min — ABNORMAL LOW
GFR calc non Af Amer: 26 mL/min — ABNORMAL LOW
Glucose, Bld: 104 mg/dL — ABNORMAL HIGH (ref 70–99)
Potassium: 3.9 mmol/L (ref 3.5–5.1)
Sodium: 131 mmol/L — ABNORMAL LOW (ref 135–145)

## 2017-10-16 LAB — CBC
HCT: 26.5 % — ABNORMAL LOW (ref 39.0–52.0)
Hemoglobin: 8.6 g/dL — ABNORMAL LOW (ref 13.0–17.0)
MCH: 29.7 pg (ref 26.0–34.0)
MCHC: 32.5 g/dL (ref 30.0–36.0)
MCV: 91.4 fL (ref 78.0–100.0)
PLATELETS: 147 10*3/uL — AB (ref 150–400)
RBC: 2.9 MIL/uL — AB (ref 4.22–5.81)
RDW: 14.9 % (ref 11.5–15.5)
WBC: 6 10*3/uL (ref 4.0–10.5)

## 2017-10-16 LAB — MAGNESIUM: Magnesium: 2 mg/dL (ref 1.7–2.4)

## 2017-10-16 MED ORDER — AMIODARONE HCL 200 MG PO TABS
400.0000 mg | ORAL_TABLET | Freq: Two times a day (BID) | ORAL | Status: DC
  Administered 2017-10-16 – 2017-10-17 (×3): 400 mg via ORAL
  Filled 2017-10-16 (×3): qty 2

## 2017-10-16 MED ORDER — METOPROLOL TARTRATE 25 MG PO TABS
25.0000 mg | ORAL_TABLET | Freq: Once | ORAL | Status: AC
  Administered 2017-10-16: 25 mg via ORAL
  Filled 2017-10-16: qty 1

## 2017-10-16 NOTE — Progress Notes (Addendum)
Progress Note  Patient Name: Gary [name] Date of Encounter: 10/16/2017  Primary Cardiologist: Nanetta Batty, MD   Subjective   Pt still complaining about coffee. Wants to go home.  Inpatient Medications    Scheduled Meds: . aspirin EC  81 mg Oral Daily  . atorvastatin  80 mg Oral q1800  . clopidogrel  75 mg Oral Daily  . fluticasone  1 spray Each Nare Daily  . isosorbide mononitrate  15 mg Oral Daily  . metoprolol succinate  50 mg Oral Daily  . sodium chloride flush  3 mL Intravenous Q12H  . tamsulosin  0.4 mg Oral Daily   Continuous Infusions: . sodium chloride     PRN Meds: sodium chloride, acetaminophen, guaiFENesin, loratadine, nitroGLYCERIN, ondansetron (ZOFRAN) IV, sodium chloride flush, traZODone   Vital Signs    Vitals:   10/15/17 1109 10/15/17 2207 10/16/17 0049 10/16/17 0429  BP: 133/76 (!) 138/55 (!) 138/97 (!) 154/120  Pulse: 68 81    Resp: (!) 21 (!) 21 (!) 22 18  Temp:  97.9 F (36.6 C) (!) 97.5 F (36.4 C) 97.9 F (36.6 C)  TempSrc:  Oral Oral Oral  SpO2:   99% 99%  Weight:    51.9 kg  Height:        Intake/Output Summary (Last 24 hours) at 10/16/2017 0937 Last data filed at 10/16/2017 0036 Gross per 24 hour  Intake -  Output 150 ml  Net -150 ml   Filed Weights   10/14/17 0335 10/15/17 0545 10/16/17 0429  Weight: 50.6 kg 50.9 kg 51.9 kg    Telemetry    Afib RVR - Personally Reviewed  ECG    pending - Personally Reviewed  Physical Exam   GEN: No acute distress.   Neck: No JVD Cardiac: irregular rate Respiratory: Clear to auscultation bilaterally. GI: Soft, nontender, non-distended  MS: No edema; No deformity. Neuro:  Nonfocal  Psych: Normal affect   Labs    Chemistry Recent Labs  Lab 10/10/17 2031  10/15/17 0523 10/15/17 1704 10/16/17 0453  NA 141   < > 134* 132* 131*  K 2.8*   < > 4.1 4.1 3.9  CL 100   < > 103 100 102  CO2 21*   < > 22 23 19*  GLUCOSE 73   < > 84 114* 104*  BUN 18   < > 29* 30* 32*   CREATININE 1.79*   < > 2.15* 2.38* 2.21*  CALCIUM 8.6*   < > 7.6* 7.8* 7.6*  PROT 5.1*  --   --   --   --   ALBUMIN 2.7*  --   --   --   --   AST 35  --   --   --   --   ALT 18  --   --   --   --   ALKPHOS 31*  --   --   --   --   BILITOT 1.5*  --   --   --   --   GFRNONAA 34*   < > 27* 24* 26*  GFRAA 40*   < > 32* 28* 31*  ANIONGAP 20*   < > 9 9 10    < > = values in this interval not displayed.     Hematology Recent Labs  Lab 10/13/17 0245 10/14/17 1057 10/15/17 1146  WBC 6.3 8.2 5.1  RBC 3.22* 2.83* 2.74*  HGB 9.3* 8.4* 7.9*  HCT 28.7* 25.9* 25.0*  MCV  89.1 91.5 91.2  MCH 28.9 29.7 28.8  MCHC 32.4 32.4 31.6  RDW 14.7 15.0 15.1  PLT 167 165 137*    Cardiac Enzymes Recent Labs  Lab 10/11/17 0012 10/11/17 0531 10/11/17 1137  TROPONINI 0.91* 1.06* 0.98*    Recent Labs  Lab 10/10/17 2039  TROPIPOC 0.81*     BNPNo results for input(s): BNP, PROBNP in the last 168 hours.   DDimer No results for input(s): DDIMER in the last 168 hours.   Radiology    No results found.  Cardiac Studies   Echo 10/12/17: Study Conclusions - Left ventricle: The cavity size was normal. Wall thickness was increased in a pattern of moderate LVH. Systolic function was normal. The estimated ejection fraction was in the range of 55% to 60%. Wall motion was normal; there were no regional wall motion abnormalities. Doppler parameters are consistent with abnormal left ventricular relaxation (grade 1 diastolic dysfunction). - Aortic valve: There was no stenosis. - Mitral valve: Moderately calcified annulus. There was mild regurgitation. - Left atrium: The atrium was moderately dilated. - Right ventricle: The cavity size was normal. Systolic function was normal. - Pulmonary arteries: No complete TR doppler jet so unable to estimate PA systolic pressure. - Inferior vena cava: The vessel was normal in size. The respirophasic diameter changes were in the normal  range (>= 50%), consistent with normal central venous pressure. - Pericardium, extracardiac: A trivial pericardial effusion was identified.  Impressions: - Normal LV size with moderate LV hypertrophy. EF 55-60%. Normal RV size and systolic function. Mild mitral regurgitation.   LHC 10/12/17:  Mid LM to Dist LM lesion is 40% stenosed.  Ost LAD lesion is 50% stenosed.  Prox Cx to Mid Cx lesion is 60% stenosed.  Ost 1st Diag lesion is 30% stenosed.  Ost 2nd Diag lesion is 90% stenosed.  1. Heavily calcified coronary arteries with mild to moderate calcific stenosis of the mid circumflex, ostial LAD, and left main. 2. Severe stenosis of the diagonal branch of the LAD. 3. Moderately elevated LVEDP  Recommendations: Medical therapy. No targets for PCI. Patient is not a candidate for surgical revascularization.Majority of CAD is nonobstructive.  Patient Profile     82 y.o. male with CAD, PAD status post left femoral popliteal bypass grafting, hypertension, COPD, alcohol abuse, and hyperlipidemia here with NSTEMI and abnormal stress test.  Assessment & Plan    1. NSTEMI, abnormal stress test - heart cath with non-obstructive CAD, except 90% ostial D2 lesion not amenable to PCI - medical therapy recommended - continue ASA and plavix - lopressor at 50 mg BID, continue lipitor 80 mg, added imdur yesterday - pressures well-controlled - repeat Hb today, if stable, may be discharged   2. New onset atrial fibrillation with RVR - pt feels weak this morning - Afib started approximately 0030 overnight 10/16/17 - rates currently in the 120s - will hold off on anticoagulation for now given anemia, waiting on repeat CBC - due to marginal pressures, may consider amiodarone in the setting of marginal pressures; however, he is not anticoagulated - he received 50mg  toprol this morning - will give an additional 25 lopressor; if pressures stable, will continue with lopressor q6 hr  for now   3. Chronic diastolic heart failure - euvolemic on exam   4. PAD - continue ASA, plavix, and statin   5. HLD - continue lipitor - D/C'ed tricor on admission, will not restart   6. Acute on chronic renal failure stage III - baseline  creatinine near 1.25 - creatinine continues to trend down, today is 2.10 (2.38) - will consider small fluid bolus today   7. SVT - continue lopressor as above   8. Anemia - Hb 7.9 yesterday, waiting for repeat today - no obvious site of bleeding - groin C/D/I - will hold off on anticoagulation for now - may need to consider     For questions or updates, please contact [facility] Please consult [email or web address] for contact info under        Signed, Marcelino Duster, PA  10/16/2017, 9:37 AM    I have examined the patient and reviewed assessment and plan and discussed with patient.  Agree with above as stated.    Watching hemoglobin. Continue medical therapy for CAD.  Potassium replaced for hypokalemia.  New onset AFib.  Plan to start Amiodarone orally since AFib onset just a few hours ago.  WIl reconsider anticoagulation depending on duration.  Given comorbidities and overall functional status, may not be a good candidate for anticoagulation longterm.   Lance Muss

## 2017-10-16 NOTE — Progress Notes (Signed)
PT Cancellation Note  Patient Details Name: Gary [name] MRN: [id number] DOB: May 01, 1935   Cancelled Treatment:    Reason Eval/Treat Not Completed: Medical issues which prohibited therapy. Pt with resting HR in a-fib ranging from 118-154 bpm . Will hold off for PT treatment and follow up as appropriate.  Ina Homes, PT, DPT Acute Rehabilitation Services  Pager [phone] Office [phone]  Malachy Chamber 10/16/2017, 9:05 AM

## 2017-10-16 NOTE — Progress Notes (Signed)
[id number] Came to see pt to walk since heart rate 83-113 afib. Discussed with pt need to walk. Pt stated not up to it. When asked questions he would answer slowly. Had not eaten lunch. Stated not hungry. Tried to get him to eat something. Asked for something cold to drink. Gave pt some cold iced tea. Took only two sips. Will continue to follow. Luetta Nutting RN BSN 10/16/2017 2:18 PM

## 2017-10-16 NOTE — Progress Notes (Signed)
Clinical Social Worker following patient and family for support. Patient has bed at [facility] once medically cleared for discharge. CSW continues to follow.   Marrianne Mood, MSW,  [employer] [phone]

## 2017-10-16 NOTE — Progress Notes (Signed)
10/16/2017 1545 Pt called out complaining of being short of breath. Sat pt up in bed. O2 saturation monitored, sating 100% on RA. Showed deep breathing techniques, not willing to engage. Pt encouraged to keep deep breathing, pt in no acute distress. Will continue to monitor.  Theophilus Kinds, RN

## 2017-10-17 DIAGNOSIS — E43 Unspecified severe protein-calorie malnutrition: Secondary | ICD-10-CM | POA: Diagnosis present

## 2017-10-17 DIAGNOSIS — I959 Hypotension, unspecified: Secondary | ICD-10-CM | POA: Diagnosis not present

## 2017-10-17 DIAGNOSIS — R498 Other voice and resonance disorders: Secondary | ICD-10-CM | POA: Diagnosis not present

## 2017-10-17 DIAGNOSIS — R042 Hemoptysis: Secondary | ICD-10-CM | POA: Diagnosis not present

## 2017-10-17 DIAGNOSIS — I252 Old myocardial infarction: Secondary | ICD-10-CM | POA: Diagnosis not present

## 2017-10-17 DIAGNOSIS — E860 Dehydration: Secondary | ICD-10-CM | POA: Diagnosis present

## 2017-10-17 DIAGNOSIS — Z515 Encounter for palliative care: Secondary | ICD-10-CM | POA: Diagnosis present

## 2017-10-17 DIAGNOSIS — E782 Mixed hyperlipidemia: Secondary | ICD-10-CM | POA: Diagnosis not present

## 2017-10-17 DIAGNOSIS — J449 Chronic obstructive pulmonary disease, unspecified: Secondary | ICD-10-CM | POA: Diagnosis not present

## 2017-10-17 DIAGNOSIS — K219 Gastro-esophageal reflux disease without esophagitis: Secondary | ICD-10-CM | POA: Diagnosis present

## 2017-10-17 DIAGNOSIS — R2689 Other abnormalities of gait and mobility: Secondary | ICD-10-CM | POA: Diagnosis not present

## 2017-10-17 DIAGNOSIS — J811 Chronic pulmonary edema: Secondary | ICD-10-CM | POA: Diagnosis not present

## 2017-10-17 DIAGNOSIS — Z888 Allergy status to other drugs, medicaments and biological substances status: Secondary | ICD-10-CM | POA: Diagnosis not present

## 2017-10-17 DIAGNOSIS — D511 Vitamin B12 deficiency anemia due to selective vitamin B12 malabsorption with proteinuria: Secondary | ICD-10-CM | POA: Diagnosis not present

## 2017-10-17 DIAGNOSIS — Z79899 Other long term (current) drug therapy: Secondary | ICD-10-CM | POA: Diagnosis not present

## 2017-10-17 DIAGNOSIS — N184 Chronic kidney disease, stage 4 (severe): Secondary | ICD-10-CM | POA: Diagnosis not present

## 2017-10-17 DIAGNOSIS — E785 Hyperlipidemia, unspecified: Secondary | ICD-10-CM | POA: Diagnosis present

## 2017-10-17 DIAGNOSIS — R1312 Dysphagia, oropharyngeal phase: Secondary | ICD-10-CM | POA: Diagnosis not present

## 2017-10-17 DIAGNOSIS — M6282 Rhabdomyolysis: Secondary | ICD-10-CM | POA: Diagnosis not present

## 2017-10-17 DIAGNOSIS — I10 Essential (primary) hypertension: Secondary | ICD-10-CM | POA: Diagnosis not present

## 2017-10-17 DIAGNOSIS — E876 Hypokalemia: Secondary | ICD-10-CM | POA: Diagnosis present

## 2017-10-17 DIAGNOSIS — I2510 Atherosclerotic heart disease of native coronary artery without angina pectoris: Secondary | ICD-10-CM | POA: Diagnosis present

## 2017-10-17 DIAGNOSIS — R0600 Dyspnea, unspecified: Secondary | ICD-10-CM | POA: Diagnosis not present

## 2017-10-17 DIAGNOSIS — Z23 Encounter for immunization: Secondary | ICD-10-CM | POA: Diagnosis not present

## 2017-10-17 DIAGNOSIS — Z87891 Personal history of nicotine dependence: Secondary | ICD-10-CM | POA: Diagnosis not present

## 2017-10-17 DIAGNOSIS — D649 Anemia, unspecified: Secondary | ICD-10-CM | POA: Diagnosis not present

## 2017-10-17 DIAGNOSIS — R627 Adult failure to thrive: Secondary | ICD-10-CM | POA: Diagnosis present

## 2017-10-17 DIAGNOSIS — I130 Hypertensive heart and chronic kidney disease with heart failure and stage 1 through stage 4 chronic kidney disease, or unspecified chronic kidney disease: Secondary | ICD-10-CM | POA: Diagnosis not present

## 2017-10-17 DIAGNOSIS — I480 Paroxysmal atrial fibrillation: Secondary | ICD-10-CM | POA: Diagnosis not present

## 2017-10-17 DIAGNOSIS — Z7902 Long term (current) use of antithrombotics/antiplatelets: Secondary | ICD-10-CM | POA: Diagnosis not present

## 2017-10-17 DIAGNOSIS — D631 Anemia in chronic kidney disease: Secondary | ICD-10-CM | POA: Diagnosis not present

## 2017-10-17 DIAGNOSIS — Z7189 Other specified counseling: Secondary | ICD-10-CM | POA: Diagnosis not present

## 2017-10-17 DIAGNOSIS — R600 Localized edema: Secondary | ICD-10-CM | POA: Diagnosis not present

## 2017-10-17 DIAGNOSIS — R54 Age-related physical debility: Secondary | ICD-10-CM | POA: Diagnosis not present

## 2017-10-17 DIAGNOSIS — R278 Other lack of coordination: Secondary | ICD-10-CM | POA: Diagnosis not present

## 2017-10-17 DIAGNOSIS — F1010 Alcohol abuse, uncomplicated: Secondary | ICD-10-CM | POA: Diagnosis present

## 2017-10-17 DIAGNOSIS — I5031 Acute diastolic (congestive) heart failure: Secondary | ICD-10-CM | POA: Diagnosis not present

## 2017-10-17 DIAGNOSIS — R41841 Cognitive communication deficit: Secondary | ICD-10-CM | POA: Diagnosis not present

## 2017-10-17 DIAGNOSIS — N179 Acute kidney failure, unspecified: Secondary | ICD-10-CM | POA: Diagnosis not present

## 2017-10-17 DIAGNOSIS — E440 Moderate protein-calorie malnutrition: Secondary | ICD-10-CM | POA: Diagnosis not present

## 2017-10-17 DIAGNOSIS — Z681 Body mass index (BMI) 19 or less, adult: Secondary | ICD-10-CM | POA: Diagnosis not present

## 2017-10-17 DIAGNOSIS — I471 Supraventricular tachycardia: Secondary | ICD-10-CM

## 2017-10-17 DIAGNOSIS — I739 Peripheral vascular disease, unspecified: Secondary | ICD-10-CM | POA: Diagnosis present

## 2017-10-17 DIAGNOSIS — D638 Anemia in other chronic diseases classified elsewhere: Secondary | ICD-10-CM | POA: Diagnosis present

## 2017-10-17 DIAGNOSIS — I214 Non-ST elevation (NSTEMI) myocardial infarction: Secondary | ICD-10-CM | POA: Diagnosis not present

## 2017-10-17 DIAGNOSIS — M6281 Muscle weakness (generalized): Secondary | ICD-10-CM | POA: Diagnosis not present

## 2017-10-17 DIAGNOSIS — D696 Thrombocytopenia, unspecified: Secondary | ICD-10-CM | POA: Diagnosis present

## 2017-10-17 DIAGNOSIS — G4700 Insomnia, unspecified: Secondary | ICD-10-CM | POA: Diagnosis not present

## 2017-10-17 DIAGNOSIS — I110 Hypertensive heart disease with heart failure: Secondary | ICD-10-CM | POA: Diagnosis not present

## 2017-10-17 DIAGNOSIS — I509 Heart failure, unspecified: Secondary | ICD-10-CM | POA: Diagnosis not present

## 2017-10-17 DIAGNOSIS — I5033 Acute on chronic diastolic (congestive) heart failure: Secondary | ICD-10-CM | POA: Diagnosis present

## 2017-10-17 DIAGNOSIS — R1310 Dysphagia, unspecified: Secondary | ICD-10-CM | POA: Diagnosis not present

## 2017-10-17 DIAGNOSIS — Z66 Do not resuscitate: Secondary | ICD-10-CM | POA: Diagnosis present

## 2017-10-17 LAB — CBC
HEMATOCRIT: 25 % — AB (ref 39.0–52.0)
Hemoglobin: 8 g/dL — ABNORMAL LOW (ref 13.0–17.0)
MCH: 29.4 pg (ref 26.0–34.0)
MCHC: 32 g/dL (ref 30.0–36.0)
MCV: 91.9 fL (ref 78.0–100.0)
Platelets: 123 10*3/uL — ABNORMAL LOW (ref 150–400)
RBC: 2.72 MIL/uL — AB (ref 4.22–5.81)
RDW: 15.1 % (ref 11.5–15.5)
WBC: 4.9 10*3/uL (ref 4.0–10.5)

## 2017-10-17 LAB — BASIC METABOLIC PANEL
ANION GAP: 10 (ref 5–15)
BUN: 36 mg/dL — ABNORMAL HIGH (ref 8–23)
CO2: 19 mmol/L — AB (ref 22–32)
Calcium: 7.4 mg/dL — ABNORMAL LOW (ref 8.9–10.3)
Chloride: 102 mmol/L (ref 98–111)
Creatinine, Ser: 2.37 mg/dL — ABNORMAL HIGH (ref 0.61–1.24)
GFR calc non Af Amer: 24 mL/min — ABNORMAL LOW (ref >60)
GFR, EST AFRICAN AMERICAN: 28 mL/min — AB (ref >60)
Glucose, Bld: 96 mg/dL (ref 70–99)
Potassium: 3.8 mmol/L (ref 3.5–5.1)
Sodium: 131 mmol/L — ABNORMAL LOW (ref 135–145)

## 2017-10-17 MED ORDER — ISOSORBIDE MONONITRATE ER 30 MG PO TB24: 15.0000 mg | ORAL_TABLET | Freq: Every day | ORAL | 3 refills | Status: DC

## 2017-10-17 MED ORDER — AMIODARONE HCL 400 MG PO TABS: ORAL_TABLET | ORAL | 1 refills | Status: DC

## 2017-10-17 MED ORDER — NITROGLYCERIN 0.4 MG SL SUBL: 0.4000 mg | SUBLINGUAL_TABLET | SUBLINGUAL | 1 refills | Status: AC | PRN

## 2017-10-17 MED ORDER — ATORVASTATIN CALCIUM 80 MG PO TABS: 80.0000 mg | ORAL_TABLET | Freq: Every day | ORAL | 3 refills | Status: DC

## 2017-10-17 MED ORDER — POLYSACCHARIDE IRON COMPLEX 150 MG PO CAPS: 150.0000 mg | ORAL_CAPSULE | Freq: Every day | ORAL | 3 refills | Status: AC

## 2017-10-17 NOTE — Progress Notes (Signed)
   10/17/17 1400  Clinical Encounter Type  Visited With Patient  Visit Type Initial  Referral From Nurse  Consult/Referral To Chaplain  Spiritual Encounters  Spiritual Needs Prayer;Emotional  Stress Factors  Patient Stress Factors Exhausted  Family Stress Factors Health changes;Exhausted   Chaplain visited with PT prior to his departure. Sister and daughter were present and were concerned about his life change. PT was concerned about his care and was looking forward to going to rehab. Chaplain offered words of encouragement and prayer with family.

## 2017-10-17 NOTE — Progress Notes (Addendum)
Progress Note  Patient Name: Haylen Dandurand Withers Date of Encounter: 10/17/2017  Primary Cardiologist: Nanetta Batty, MD   Subjective   Pt mad about not being mobilized off of his bed sore  Inpatient Medications    Scheduled Meds: . amiodarone  400 mg Oral BID  . aspirin EC  81 mg Oral Daily  . atorvastatin  80 mg Oral q1800  . clopidogrel  75 mg Oral Daily  . fluticasone  1 spray Each Nare Daily  . isosorbide mononitrate  15 mg Oral Daily  . sodium chloride flush  3 mL Intravenous Q12H  . tamsulosin  0.4 mg Oral Daily   Continuous Infusions: . sodium chloride     PRN Meds: sodium chloride, acetaminophen, guaiFENesin, loratadine, nitroGLYCERIN, ondansetron (ZOFRAN) IV, sodium chloride flush, traZODone   Vital Signs    Vitals:   10/16/17 2035 10/16/17 2120 10/17/17 0028 10/17/17 0523  BP: (!) 75/33 126/60 111/63 (!) 147/67  Pulse: 66  64 64  Resp: (!) 22 (!) 21 (!) 26 20  Temp: 97.6 F (36.4 C)  98.7 F (37.1 C) 97.6 F (36.4 C)  TempSrc: Oral  Oral Oral  SpO2:   100% 100%  Weight:    51.7 kg  Height:        Intake/Output Summary (Last 24 hours) at 10/17/2017 0905 Last data filed at 10/17/2017 0700 Gross per 24 hour  Intake 3 ml  Output 200 ml  Net -197 ml   Filed Weights   10/15/17 0545 10/16/17 0429 10/17/17 0523  Weight: 50.9 kg 51.9 kg 51.7 kg    Telemetry    Converted to NSR 1640 10/16/17 - Personally Reviewed  ECG    pending - Personally Reviewed  Physical Exam   GEN: No acute distress.   Neck: No JVD Cardiac: RRR, no murmurs, rubs, or gallops.  Respiratory: Clear to auscultation bilaterally. GI: Soft, nontender, non-distended  MS: No edema; No deformity. Neuro:  Nonfocal  Psych: Normal affect   Labs    Chemistry Recent Labs  Lab 10/10/17 2031  10/15/17 1704 10/16/17 0453 10/17/17 0334  NA 141   < > 132* 131* 131*  K 2.8*   < > 4.1 3.9 3.8  CL 100   < > 100 102 102  CO2 21*   < > 23 19* 19*  GLUCOSE 73   < > 114* 104*  96  BUN 18   < > 30* 32* 36*  CREATININE 1.79*   < > 2.38* 2.21* 2.37*  CALCIUM 8.6*   < > 7.8* 7.6* 7.4*  PROT 5.1*  --   --   --   --   ALBUMIN 2.7*  --   --   --   --   AST 35  --   --   --   --   ALT 18  --   --   --   --   ALKPHOS 31*  --   --   --   --   BILITOT 1.5*  --   --   --   --   GFRNONAA 34*   < > 24* 26* 24*  GFRAA 40*   < > 28* 31* 28*  ANIONGAP 20*   < > 9 10 10    < > = values in this interval not displayed.     Hematology Recent Labs  Lab 10/15/17 1146 10/16/17 1021 10/17/17 0334  WBC 5.1 6.0 4.9  RBC 2.74* 2.90* 2.72*  HGB 7.9* 8.6*  8.0*  HCT 25.0* 26.5* 25.0*  MCV 91.2 91.4 91.9  MCH 28.8 29.7 29.4  MCHC 31.6 32.5 32.0  RDW 15.1 14.9 15.1  PLT 137* 147* 123*    Cardiac Enzymes Recent Labs  Lab 10/11/17 0012 10/11/17 0531 10/11/17 1137  TROPONINI 0.91* 1.06* 0.98*    Recent Labs  Lab 10/10/17 2039  TROPIPOC 0.81*     BNPNo results for input(s): BNP, PROBNP in the last 168 hours.   DDimer No results for input(s): DDIMER in the last 168 hours.   Radiology    No results found.  Cardiac Studies   Echo 10/12/17: Study Conclusions - Left ventricle: The cavity size was normal. Wall thickness was increased in a pattern of moderate LVH. Systolic function was normal. The estimated ejection fraction was in the range of 55% to 60%. Wall motion was normal; there were no regional wall motion abnormalities. Doppler parameters are consistent with abnormal left ventricular relaxation (grade 1 diastolic dysfunction). - Aortic valve: There was no stenosis. - Mitral valve: Moderately calcified annulus. There was mild regurgitation. - Left atrium: The atrium was moderately dilated. - Right ventricle: The cavity size was normal. Systolic function was normal. - Pulmonary arteries: No complete TR doppler jet so unable to estimate PA systolic pressure. - Inferior vena cava: The vessel was normal in size. The respirophasic  diameter changes were in the normal range (>= 50%), consistent with normal central venous pressure. - Pericardium, extracardiac: A trivial pericardial effusion was identified.  Impressions: - Normal LV size with moderate LV hypertrophy. EF 55-60%. Normal RV size and systolic function. Mild mitral regurgitation.   LHC 10/12/17:  Mid LM to Dist LM lesion is 40% stenosed.  Ost LAD lesion is 50% stenosed.  Prox Cx to Mid Cx lesion is 60% stenosed.  Ost 1st Diag lesion is 30% stenosed.  Ost 2nd Diag lesion is 90% stenosed.  1. Heavily calcified coronary arteries with mild to moderate calcific stenosis of the mid circumflex, ostial LAD, and left main. 2. Severe stenosis of the diagonal branch of the LAD. 3. Moderately elevated LVEDP  Recommendations: Medical therapy. No targets for PCI. Patient is not a candidate for surgical revascularization.Majority of CAD is nonobstructive.  Patient Profile     82 y.o. male with CAD, PAD status post left femoral popliteal bypass grafting, hypertension, COPD, alcohol abuse, and hyperlipidemia here with NSTEMI and abnormal stress test. Converted to Afib 10/16/17 --> amio --> NSR today.  Assessment & Plan    1. NSTEMI, abnormal stress test - - heart cath with non-obstructive CAD, except 90% ostial D2 lesion not amenable to PCI - medical therapy recommended - continue ASA and plavix - lopressor at 50 mg BID, continue lipitor 80 mg, added imdur yesterday - pressures well-controlled - no anginal symptoms  2. New onset atrial fibrillation with RVR - started 0030 10/16/17 - started amiodarone PO load yesterday, toprol D/C'ed - holding off on anticoagulation at this time - This patients CHA2DS2-VASc Score and unadjusted Ischemic Stroke Rate (% per year) is equal to 4.8 % stroke rate/year from a score of 4 (age, PAD, HTN) - converted to NSR at 1640 10/16/17 - continue amiodarone load, HR in the 60s, will hold off on adding back  toprol  3. Chronic diastolic heart failure - euvolemic on exam  4. PAD - continue ASA, plavix, and statin  5. HLD - continue statin - D/C'ed tricor on admission, will not restart  6. Acute on chronic renal failure stage III -  baseline creatinine near 1.25 - sCr 2.73 (2.21 --> 2.38), may be his new baselin - not making a lot of urine, may need to get nephrology involved - will defer to primary  7. SVT - toprol switched to amio with Afib  8. Anemia - Hb 8.0 (8.6)   For questions or updates, please contact [facility] Please consult [email or web address] for contact info under        Signed, Marcelino Duster, PA  10/17/2017, 9:05 AM    I have examined the patient and reviewed assessment and plan and discussed with patient.  Agree with above as stated.  Patient back in NSR.  He continues to be upset about the food and his lack of sweet iced tea.    He has anemia and is frail.  WOulld not start anticoagulation.  He converted to NSR quickly.  WIll continue amiodarone.  Dosage to taper over the next few weeks. Ultimately, should be on Amio 200 going forward with regular LFT and TSH checks.   Plan to d/c to facility today.  Lance Muss

## 2017-10-17 NOTE — Progress Notes (Signed)
10/17/2017 3:07 PM Discharge AVS meds taken today and those due this evening reviewed.  Follow-up appointments and when to call md reviewed.  D/C IV and TELE.  Questions and concerns addressed.   D/C to [facility] per private vehicle with daughter. Kathryne Hitch

## 2017-10-17 NOTE — Discharge Summary (Addendum)
Discharge Summary    Patient ID: Gary [name] MRN: [id number]; DOB: 1935/11/08  Admit date: 10/10/2017 Discharge date: 10/17/2017  Primary Care Provider: Lorre Munroe, NP  Primary Cardiologist: Nanetta Batty, MD  Primary Electrophysiologist:  None   Discharge Diagnoses    Principal Problem:   NSTEMI (non-ST elevated myocardial infarction) [facility]) Active Problems:   PVD, LFBPG 2004, dopplers show progression of disease bliat   HTN    HLD (hyperlipidemia)   Carotid artery disease (HCC)   COPD (chronic obstructive pulmonary disease) (HCC)   Pressure injury of skin   Stage 4 chronic kidney disease (HCC)   Anemia   SVT (supraventricular tachycardia) (HCC)   Paroxysmal atrial fibrillation (HCC)   Allergies Allergies  Allergen Reactions  . Contrast Media [Iodinated Diagnostic Agents] Itching  . Diovan [Valsartan] Swelling    angioedema  . Neomycin Hives  . Neosporin [Neomycin-Bacitracin Zn-Polymyx] Hives    Diagnostic Studies/Procedures    Left heart cath 10/12/17:  Mid LM to Dist LM lesion is 40% stenosed.  Ost LAD lesion is 50% stenosed.  Prox Cx to Mid Cx lesion is 60% stenosed.  Ost 1st Diag lesion is 30% stenosed.  Ost 2nd Diag lesion is 90% stenosed.   1.  Heavily calcified coronary arteries with mild to moderate calcific stenosis of the mid circumflex, ostial LAD, and left main. 2.  Severe stenosis of the diagonal branch of the LAD. 3.  Moderately elevated LVEDP  Recommendations: Medical therapy.  No targets for PCI.  Patient is not a candidate for surgical revascularization. Majority of CAD is nonobstructive.   Echo 10/12/17: Study Conclusions - Left ventricle: The cavity size was normal. Wall thickness was   increased in a pattern of moderate LVH. Systolic function was   normal. The estimated ejection fraction was in the range of 55%   to 60%. Wall motion was normal; there were no regional wall   motion abnormalities. Doppler parameters are  consistent with   abnormal left ventricular relaxation (grade 1 diastolic   dysfunction). - Aortic valve: There was no stenosis. - Mitral valve: Moderately calcified annulus. There was mild   regurgitation. - Left atrium: The atrium was moderately dilated. - Right ventricle: The cavity size was normal. Systolic function   was normal. - Pulmonary arteries: No complete TR doppler jet so unable to   estimate PA systolic pressure. - Inferior vena cava: The vessel was normal in size. The   respirophasic diameter changes were in the normal range (>= 50%),   consistent with normal central venous pressure. - Pericardium, extracardiac: A trivial pericardial effusion was   identified.  Impressions: - Normal LV size with moderate LV hypertrophy. EF 55-60%. Normal RV   size and systolic function. Mild mitral regurgitation.    History of Present Illness       Hospital Course     Consultants: none   NSTEMI, abnormal stress test Pt was admitted to cardiology and taken to the cath lab. Heart cath showed non-obstructive CAD, excepti 90% D2 lesion not amenable to PCI. Medical therapy recommended. He as continued on ASA and plavix, but unfortunately because anemic, as below. ASA was stopped at discharge and he was continued on plavix.   HLD Lipitor 80 mg continued. Recheck lipids in 6 weeks.  New onset paroxysmal atrial fibrillation He converted to Afib RVR on 10/16/17 at approximately 0030. Lopressor was increased with little effect on rate. PO amiodarone load was initiated with 400 mg amio BID. He converted to NSR  on 10/16/17 at approximately 1640. Given his anemia, anticoagulation was held.  This patients CHA2DS2-VASc Score and unadjusted Ischemic Stroke Rate (% per year) is equal to 4.8 % stroke rate/year from a score of 4 (age, PAD, HTN). He converted to NSR and was in PAF for less than 24 hrs. Given his frailty and ongoing anemia, decision was made to not start anticoagulation. He was  discharged on amiodarone load of 400 mg BID x 14 days, then decrease to 400 mg daily. Will consider transitioning to 200 mg daily at outpatient follow up.   Chronic diastolic heart failure He remained euvolemic during this hospitalization.   Anemia Admission Hb was 11.4. Following heart cath, Hb drifted with a nadir of 7.9. Hb on discharge was 8.0 with no obvious source of bleeding. Discharged on iron supplements. Watch for bleeding at SNF.  PAD Continue plavix and statin.   Acute on chronic renal failure stage III Baseline creatinine appears to be 1.25. sCr this admission has been fluctuating, discharge creatinine is 2.37 (2.21, 2.38, 2.15, 2.19, 1.73). This may be his new baseline. May need to follow up with nephrology outpatient.   Hx of SVT Toprol was D/C'ed for amiodarone, as above. HR in the 60s. Did not restart toprol.   Pt was seen and examined by Dr. Eldridge Dace and deemed stable for discharge to SNF.  _____________  Discharge Vitals Blood pressure (!) 147/67, pulse 64, temperature 97.6 F (36.4 C), temperature source Oral, resp. rate 20, height 5\' 8"  (1.727 m), weight 51.7 kg, SpO2 100 %.  Filed Weights   10/15/17 0545 10/16/17 0429 10/17/17 0523  Weight: 50.9 kg 51.9 kg 51.7 kg    Labs & Radiologic Studies    CBC Recent Labs    10/16/17 1021 10/17/17 0334  WBC 6.0 4.9  HGB 8.6* 8.0*  HCT 26.5* 25.0*  MCV 91.4 91.9  PLT 147* 123*   Basic Metabolic Panel Recent Labs    16/10/96 0523  10/16/17 0453 10/17/17 0334  NA 134*   < > 131* 131*  K 4.1   < > 3.9 3.8  CL 103   < > 102 102  CO2 22   < > 19* 19*  GLUCOSE 84   < > 104* 96  BUN 29*   < > 32* 36*  CREATININE 2.15*   < > 2.21* 2.37*  CALCIUM 7.6*   < > 7.6* 7.4*  MG 1.5*  --  2.0  --    < > = values in this interval not displayed.   Liver Function Tests No results for input(s): AST, ALT, ALKPHOS, BILITOT, PROT, ALBUMIN in the last 72 hours. No results for input(s): LIPASE, AMYLASE in the last 72  hours. Cardiac Enzymes No results for input(s): CKTOTAL, CKMB, CKMBINDEX, TROPONINI in the last 72 hours. BNP Invalid input(s): POCBNP D-Dimer No results for input(s): DDIMER in the last 72 hours. Hemoglobin A1C No results for input(s): HGBA1C in the last 72 hours. Fasting Lipid Panel No results for input(s): CHOL, HDL, LDLCALC, TRIG, CHOLHDL, LDLDIRECT in the last 72 hours. Thyroid Function Tests No results for input(s): TSH, T4TOTAL, T3FREE, THYROIDAB in the last 72 hours.  Invalid input(s): FREET3 _____________  Nm Myocar Multi W/spect W/wall Motion / Ef  Result Date: 10/11/2017  There was no ST segment deviation noted during stress.  Findings consistent with prior myocardial infarction with peri-infarct ischemia.  The left ventricular ejection fraction is moderately decreased (30-44%).  1. EF 33%, diffuse hypokinesis. 2. Fixed large, severe  basal to apical inferior and inferolateral perfusion defect suggestive of prior infarction. Partially reversible medium-sized, mild mid to apical anteroseptal/anterior perfusion defect suggesting infarction with peri-infarct ischemia. Intermediate risk study.  Moderately decreased systolic function.  Inferior/inferolateral infarction.  Mid to apical anteroseptal relatively mild defect that is partially reversible, may be evidence for infarction with peri-infarct ischemia.   Disposition   Pt is being discharged home today in good condition.  Follow-up Plans & Appointments    Follow-up Information    Runell Gess, MD Follow up in 2 week(s).   Specialties:  Cardiology, Radiology Why:  [phone] call for appt if not made by 10/18/17 Contact information: [address] [place] [address] [phone]          Discharge Instructions    Amb Referral to Cardiac Rehabilitation   Complete by:  As directed    Diagnosis:  NSTEMI   Diet - low sodium heart healthy   Complete by:  As directed    Discharge instructions    Complete by:  As directed    No lifting over 5 lbs for 1 week. No sexual activity for 1 week. Keep procedure site clean & dry. If you notice increased pain, swelling, bleeding or pus, call/return!  You may shower, but no soaking baths/hot tubs/pools for 1 week.   Increase activity slowly   Complete by:  As directed       Discharge Medications   Allergies as of 10/17/2017      Reactions   Contrast Media [iodinated Diagnostic Agents] Itching   Diovan [valsartan] Swelling   angioedema   Neomycin Hives   Neosporin [neomycin-bacitracin Zn-polymyx] Hives      Medication List    STOP taking these medications   aspirin EC 81 MG tablet   fenofibrate 145 MG tablet Commonly known as:  TRICOR   metoprolol succinate 25 MG 24 hr tablet Commonly known as:  TOPROL-XL   mirtazapine 7.5 MG tablet Commonly known as:  REMERON     TAKE these medications   amiodarone 400 MG tablet Commonly known as:  PACERONE Take 400 mg twice daily for 14 days. Then decrease to 400 mg daily until seen in follow up.   atorvastatin 80 MG tablet Commonly known as:  LIPITOR Take 1 tablet (80 mg total) by mouth daily. What changed:    medication strength  how much to take   clopidogrel 75 MG tablet Commonly known as:  PLAVIX TAKE 1 TABLET BY MOUTH EVERY DAY**PT NEEDS APPT FOR FURTHER REFILLS PER DOCTOR** What changed:  See the new instructions.   iron polysaccharides 150 MG capsule Commonly known as:  NIFEREX Take 1 capsule (150 mg total) by mouth daily.   isosorbide mononitrate 30 MG 24 hr tablet Commonly known as:  IMDUR Take 0.5 tablets (15 mg total) by mouth daily. Start taking on:  10/18/2017   loratadine 10 MG tablet Commonly known as:  CLARITIN TAKE 1 TABLET (10 MG TOTAL) BY MOUTH DAILY. What changed:    when to take this  reasons to take this   mometasone 50 MCG/ACT nasal spray Commonly known as:  NASONEX Place 2 sprays into the nose daily. What changed:    when to take  this  reasons to take this   nitroGLYCERIN 0.4 MG SL tablet Commonly known as:  NITROSTAT Place 1 tablet (0.4 mg total) under the tongue every 5 (five) minutes x 3 doses as needed for chest pain.   tamsulosin 0.4 MG Caps capsule  Commonly known as:  FLOMAX Take 1 capsule (0.4 mg total) by mouth daily.   traZODone 50 MG tablet Commonly known as:  DESYREL TAKE 1/2 TO 1 TABLET BY MOUTH AT BEDTIME AS NEEDED FOR SLEEP What changed:    reasons to take this  additional instructions        Acute coronary syndrome (MI, NSTEMI, STEMI, etc) this admission?: No.    Outstanding Labs/Studies   BMP and CBC  Duration of Discharge Encounter   Greater than 30 minutes including physician time.  Signed, Roe Rutherford Duke, PA 10/17/2017, 12:04 PM   I have examined the patient and reviewed assessment and plan and discussed with patient.  Agree with above as stated.  Patient back in NSR.  He continues to be upset about the food and his lack of sweet iced tea.    He has anemia and is frail.  WOulld not start anticoagulation.  He converted to NSR quickly.  WIll continue amiodarone.  Dosage to taper over the next few weeks. Ultimately, should be on Amio 200 going forward with regular LFT and TSH checks.   Plan to d/c to facility today.  Lance Muss

## 2017-10-17 NOTE — Progress Notes (Signed)
Physical Therapy Treatment Patient Details Name: Gary [name] MRN: [id number] DOB: 05/02/1935 Today's Date: 10/17/2017    History of Present Illness Pt is an 82 y.o. male admitted 10/10/17 after falling at home and couldn't get up; worked up for NSTEMI. Underwent heart cath and coronary angiography on 9/6 indicating nonobstructive CAD; plan for medical management. PMH includes CAD, COPD, CHF, PVD, alcohol abuse.   PT Comments    Pt slowly progressing this session. Limited this session by c/o fatigue and sacral soreness, declining gait training. Able to stand and take steps to recliner with RW and modA. Pillows placed in attempt to relieve soreness. Pt educ on importance of frequent OOB mobility and ambulation. Will continue to follow acutely.   Follow Up Recommendations  SNF;Supervision/Assistance - 24 hour     Equipment Recommendations  None recommended by PT    Recommendations for Other Services       Precautions / Restrictions Precautions Precautions: Fall Restrictions Weight Bearing Restrictions: No    Mobility  Bed Mobility               General bed mobility comments: Received sitting EOB leaning back on stack of pillows  Transfers Overall transfer level: Needs assistance Equipment used: Rolling walker (2 wheeled) Transfers: Sit to/from Stand Sit to Stand: Mod assist         General transfer comment: ModA to assist trunk elevation; cues for hand placement on RW  Ambulation/Gait Ambulation/Gait assistance: Min assist Gait Distance (Feet): 2 Feet Assistive device: Rolling walker (2 wheeled) Gait Pattern/deviations: Decreased stride length;Shuffle;Narrow base of support;Trunk flexed Gait velocity: Decreased Gait velocity interpretation: <1.31 ft/sec, indicative of household ambulator General Gait Details: Pt only agreeable to take steps from bed to recliner; attempting to reach UE from RW onto recliner hand rail despite cues. Forward flexed with  minimal foot clearance; minA to maintain balance   Stairs             Wheelchair Mobility    Modified Rankin (Stroke Patients Only)       Balance Overall balance assessment: Needs assistance Sitting-balance support: Feet supported;Single extremity supported Sitting balance-Leahy Scale: Fair Sitting balance - Comments: Reliant on UE support Postural control: Posterior lean   Standing balance-Leahy Scale: Poor Standing balance comment: Reliant on UE support                            Cognition Arousal/Alertness: Awake/alert Behavior During Therapy: Flat affect Overall Cognitive Status: No family/caregiver present to determine baseline cognitive functioning                                 General Comments: pt slow to respond but appropriate when answering questions. [facility]      Exercises      General Comments        Pertinent Vitals/Pain Pain Assessment: Faces Faces Pain Scale: Hurts a little bit Pain Location: Sacrum Pain Descriptors / Indicators: Sore Pain Intervention(s): Repositioned    Home Living                      Prior Function            PT Goals (current goals can now be found in the care plan section) Acute Rehab PT Goals Patient Stated Goal: "Get out of this hospital" PT Goal Formulation: With patient Time For Goal Achievement: 10/29/17  Potential to Achieve Goals: Good Progress towards PT goals: Progressing toward goals    Frequency    Min 3X/week      PT Plan Current plan remains appropriate    Co-evaluation              AM-PAC PT "6 Clicks" Daily Activity  Outcome Measure  Difficulty turning over in bed (including adjusting bedclothes, sheets and blankets)?: Unable Difficulty moving from lying on back to sitting on the side of the bed? : Unable Difficulty sitting down on and standing up from a chair with arms (e.g., wheelchair, bedside commode, etc,.)?: Unable Help needed moving to  and from a bed to chair (including a wheelchair)?: A Little Help needed walking in hospital room?: A Lot Help needed climbing 3-5 steps with a railing? : A Lot 6 Click Score: 10    End of Session   Activity Tolerance: Patient limited by fatigue Patient left: in chair;with call bell/phone within reach;with chair alarm set Nurse Communication: Mobility status PT Visit Diagnosis: Unsteadiness on feet (R26.81);Muscle weakness (generalized) (M62.81)     Time: 1050-1110 PT Time Calculation (min) (ACUTE ONLY): 20 min  Charges:  $Therapeutic Activity: 8-22 mins                     Ina Homes, PT, DPT Acute Rehabilitation Services  Pager [phone] Office [phone]  Malachy Chamber 10/17/2017, 11:19 AM

## 2017-10-17 NOTE — Progress Notes (Signed)
Clinical Social Worker facilitated patient discharge including contacting patient family and facility to confirm patient discharge plans.  Clinical information faxed to facility and family agreeable with plan.  CSW arranged ambulance transport via [facility] to [facility] . Per RN patient daughter will transport patient to facility.  RN to call [phone] (rm# [id number]) for report prior to discharge.  Clinical Social Worker will sign off for now as social work intervention is no longer needed. Please consult [place] again if new need arises.  Marrianne Mood, MSW, [employer] [phone]

## 2017-10-18 ENCOUNTER — Telehealth (HOSPITAL_COMMUNITY): Payer: Self-pay

## 2017-10-18 NOTE — Telephone Encounter (Signed)
Patient was seen by Phrase1, and per ph.1 "pt unable, don't call".  Closed referral

## 2017-10-19 DIAGNOSIS — I214 Non-ST elevation (NSTEMI) myocardial infarction: Secondary | ICD-10-CM | POA: Diagnosis not present

## 2017-10-19 DIAGNOSIS — N184 Chronic kidney disease, stage 4 (severe): Secondary | ICD-10-CM | POA: Diagnosis not present

## 2017-10-19 DIAGNOSIS — D631 Anemia in chronic kidney disease: Secondary | ICD-10-CM | POA: Diagnosis not present

## 2017-10-22 ENCOUNTER — Telehealth: Payer: Self-pay | Admitting: Cardiovascular Disease

## 2017-10-22 NOTE — Telephone Encounter (Signed)
New Message         Patient's daughter is calling she needs to talk to Dr. Allyson Sabal concerning  her dad's medications. Pls advise.

## 2017-10-22 NOTE — Telephone Encounter (Signed)
Spoke with pt's daughter who states her father is currently in a nursing home and is being non compliant with taking his medication. Daughter states she feels at this point in his life should would like for his medication to be reevaluated as to whether he needs to be taking it , and if so could it be switched to a liquid form. Routing to MD

## 2017-10-23 DIAGNOSIS — R1310 Dysphagia, unspecified: Secondary | ICD-10-CM | POA: Diagnosis not present

## 2017-10-23 DIAGNOSIS — I10 Essential (primary) hypertension: Secondary | ICD-10-CM | POA: Diagnosis not present

## 2017-10-23 NOTE — Telephone Encounter (Signed)
Have her make appointment with Trula Ore Raquel to review medications and options

## 2017-10-24 DIAGNOSIS — R0600 Dyspnea, unspecified: Secondary | ICD-10-CM | POA: Diagnosis not present

## 2017-10-24 DIAGNOSIS — R042 Hemoptysis: Secondary | ICD-10-CM | POA: Diagnosis not present

## 2017-10-25 DIAGNOSIS — R0600 Dyspnea, unspecified: Secondary | ICD-10-CM | POA: Diagnosis not present

## 2017-10-25 DIAGNOSIS — R600 Localized edema: Secondary | ICD-10-CM | POA: Diagnosis not present

## 2017-10-26 DIAGNOSIS — R0600 Dyspnea, unspecified: Secondary | ICD-10-CM | POA: Diagnosis not present

## 2017-10-26 DIAGNOSIS — R600 Localized edema: Secondary | ICD-10-CM | POA: Diagnosis not present

## 2017-10-26 DIAGNOSIS — I10 Essential (primary) hypertension: Secondary | ICD-10-CM | POA: Diagnosis not present

## 2017-10-26 NOTE — Telephone Encounter (Signed)
Spoke with daughter about her concerns.  States patient is not taking his medications without a lot of argument.  Complains about size of tablets, and even when crushed.  Patient currently has pneumonia and she is not sure about his long term prospects.  She would rather that they not spend the last few months of his life fighting about taking medications.    Reviewed medications with her.  Atorvastatin 80 mg is rather large tablet so will discontinue for now.  Also will discontinue isosorbide mono 15 mg.  Hopefully by decreasing the number of meds he needs to take, he will take the amiodarone and clopidogrel without complaint.    Spoke with Charity fundraiser at [employer].  He admitted patient is sometimes difficult about taking his medications, but some days are better than others.    Orders faxed.

## 2017-10-29 ENCOUNTER — Telehealth: Payer: Self-pay | Admitting: Pharmacist Clinician (PhC)/ Clinical Pharmacy Specialist

## 2017-10-29 DIAGNOSIS — R600 Localized edema: Secondary | ICD-10-CM | POA: Diagnosis not present

## 2017-10-29 DIAGNOSIS — G4700 Insomnia, unspecified: Secondary | ICD-10-CM | POA: Diagnosis not present

## 2017-10-29 DIAGNOSIS — N184 Chronic kidney disease, stage 4 (severe): Secondary | ICD-10-CM | POA: Diagnosis not present

## 2017-10-29 DIAGNOSIS — R0600 Dyspnea, unspecified: Secondary | ICD-10-CM | POA: Diagnosis not present

## 2017-10-29 NOTE — Telephone Encounter (Signed)
Updated medication list

## 2017-10-31 ENCOUNTER — Encounter (HOSPITAL_COMMUNITY): Payer: Self-pay | Admitting: *Deleted

## 2017-10-31 ENCOUNTER — Other Ambulatory Visit: Payer: Self-pay

## 2017-10-31 ENCOUNTER — Inpatient Hospital Stay (HOSPITAL_COMMUNITY)
Admission: EM | Admit: 2017-10-31 | Discharge: 2017-11-05 | DRG: 291 | Disposition: A | Discharge status: Skilled Nursing Facility | Payer: Medicare Other | Attending: Internal Medicine | Admitting: Internal Medicine

## 2017-10-31 ENCOUNTER — Emergency Department (HOSPITAL_COMMUNITY): Payer: Medicare Other

## 2017-10-31 DIAGNOSIS — Z888 Allergy status to other drugs, medicaments and biological substances status: Secondary | ICD-10-CM

## 2017-10-31 DIAGNOSIS — I5031 Acute diastolic (congestive) heart failure: Secondary | ICD-10-CM | POA: Diagnosis not present

## 2017-10-31 DIAGNOSIS — D649 Anemia, unspecified: Secondary | ICD-10-CM

## 2017-10-31 DIAGNOSIS — E440 Moderate protein-calorie malnutrition: Secondary | ICD-10-CM | POA: Diagnosis present

## 2017-10-31 DIAGNOSIS — I739 Peripheral vascular disease, unspecified: Secondary | ICD-10-CM | POA: Diagnosis present

## 2017-10-31 DIAGNOSIS — E785 Hyperlipidemia, unspecified: Secondary | ICD-10-CM | POA: Diagnosis present

## 2017-10-31 DIAGNOSIS — D638 Anemia in other chronic diseases classified elsewhere: Secondary | ICD-10-CM | POA: Diagnosis present

## 2017-10-31 DIAGNOSIS — N184 Chronic kidney disease, stage 4 (severe): Secondary | ICD-10-CM | POA: Diagnosis present

## 2017-10-31 DIAGNOSIS — I959 Hypotension, unspecified: Secondary | ICD-10-CM | POA: Diagnosis present

## 2017-10-31 DIAGNOSIS — I252 Old myocardial infarction: Secondary | ICD-10-CM

## 2017-10-31 DIAGNOSIS — I509 Heart failure, unspecified: Secondary | ICD-10-CM

## 2017-10-31 DIAGNOSIS — F1010 Alcohol abuse, uncomplicated: Secondary | ICD-10-CM | POA: Diagnosis present

## 2017-10-31 DIAGNOSIS — K219 Gastro-esophageal reflux disease without esophagitis: Secondary | ICD-10-CM | POA: Diagnosis present

## 2017-10-31 DIAGNOSIS — E876 Hypokalemia: Secondary | ICD-10-CM | POA: Diagnosis present

## 2017-10-31 DIAGNOSIS — D696 Thrombocytopenia, unspecified: Secondary | ICD-10-CM | POA: Diagnosis present

## 2017-10-31 DIAGNOSIS — R627 Adult failure to thrive: Secondary | ICD-10-CM | POA: Diagnosis present

## 2017-10-31 DIAGNOSIS — I130 Hypertensive heart and chronic kidney disease with heart failure and stage 1 through stage 4 chronic kidney disease, or unspecified chronic kidney disease: Principal | ICD-10-CM | POA: Diagnosis present

## 2017-10-31 DIAGNOSIS — Z7902 Long term (current) use of antithrombotics/antiplatelets: Secondary | ICD-10-CM

## 2017-10-31 DIAGNOSIS — Z515 Encounter for palliative care: Secondary | ICD-10-CM | POA: Diagnosis present

## 2017-10-31 DIAGNOSIS — I10 Essential (primary) hypertension: Secondary | ICD-10-CM | POA: Diagnosis present

## 2017-10-31 DIAGNOSIS — I5033 Acute on chronic diastolic (congestive) heart failure: Secondary | ICD-10-CM | POA: Diagnosis present

## 2017-10-31 DIAGNOSIS — I480 Paroxysmal atrial fibrillation: Secondary | ICD-10-CM | POA: Diagnosis present

## 2017-10-31 DIAGNOSIS — E43 Unspecified severe protein-calorie malnutrition: Secondary | ICD-10-CM

## 2017-10-31 DIAGNOSIS — E860 Dehydration: Secondary | ICD-10-CM | POA: Diagnosis present

## 2017-10-31 DIAGNOSIS — R69 Illness, unspecified: Secondary | ICD-10-CM

## 2017-10-31 DIAGNOSIS — Z681 Body mass index (BMI) 19 or less, adult: Secondary | ICD-10-CM

## 2017-10-31 DIAGNOSIS — R54 Age-related physical debility: Secondary | ICD-10-CM | POA: Diagnosis not present

## 2017-10-31 DIAGNOSIS — I110 Hypertensive heart disease with heart failure: Secondary | ICD-10-CM | POA: Diagnosis not present

## 2017-10-31 DIAGNOSIS — Z79899 Other long term (current) drug therapy: Secondary | ICD-10-CM

## 2017-10-31 DIAGNOSIS — Z66 Do not resuscitate: Secondary | ICD-10-CM | POA: Diagnosis present

## 2017-10-31 DIAGNOSIS — J811 Chronic pulmonary edema: Secondary | ICD-10-CM | POA: Diagnosis not present

## 2017-10-31 DIAGNOSIS — Z87891 Personal history of nicotine dependence: Secondary | ICD-10-CM

## 2017-10-31 DIAGNOSIS — I2510 Atherosclerotic heart disease of native coronary artery without angina pectoris: Secondary | ICD-10-CM | POA: Diagnosis present

## 2017-10-31 LAB — ABO/RH: ABO/RH(D): AB POS

## 2017-10-31 LAB — COMPREHENSIVE METABOLIC PANEL
ALK PHOS: 38 U/L (ref 38–126)
ALT: 17 U/L (ref 0–44)
AST: 24 U/L (ref 15–41)
Albumin: 2.4 g/dL — ABNORMAL LOW (ref 3.5–5.0)
Anion gap: 8 (ref 5–15)
BILIRUBIN TOTAL: 0.7 mg/dL (ref 0.3–1.2)
BUN: 30 mg/dL — ABNORMAL HIGH (ref 8–23)
CALCIUM: 7.8 mg/dL — AB (ref 8.9–10.3)
CO2: 27 mmol/L (ref 22–32)
CREATININE: 2.43 mg/dL — AB (ref 0.61–1.24)
Chloride: 99 mmol/L (ref 98–111)
GFR, EST AFRICAN AMERICAN: 27 mL/min — AB (ref >60)
GFR, EST NON AFRICAN AMERICAN: 24 mL/min — AB (ref >60)
Glucose, Bld: 96 mg/dL (ref 70–99)
Potassium: 3.1 mmol/L — ABNORMAL LOW (ref 3.5–5.1)
Sodium: 134 mmol/L — ABNORMAL LOW (ref 135–145)
TOTAL PROTEIN: 4.7 g/dL — AB (ref 6.5–8.1)

## 2017-10-31 LAB — CBC WITH DIFFERENTIAL/PLATELET
BASOS PCT: 0 %
Basophils Absolute: 0 10*3/uL (ref 0.0–0.1)
EOS PCT: 1 %
Eosinophils Absolute: 0.1 10*3/uL (ref 0.0–0.7)
HCT: 22.1 % — ABNORMAL LOW (ref 39.0–52.0)
HEMOGLOBIN: 7.3 g/dL — AB (ref 13.0–17.0)
Lymphocytes Relative: 11 %
Lymphs Abs: 0.5 10*3/uL — ABNORMAL LOW (ref 0.7–4.0)
MCH: 29.8 pg (ref 26.0–34.0)
MCHC: 33 g/dL (ref 30.0–36.0)
MCV: 90.2 fL (ref 78.0–100.0)
Monocytes Absolute: 0.4 10*3/uL (ref 0.1–1.0)
Monocytes Relative: 8 %
NEUTROS PCT: 80 %
Neutro Abs: 3.5 10*3/uL (ref 1.7–7.7)
PLATELETS: 131 10*3/uL — AB (ref 150–400)
RBC: 2.45 MIL/uL — AB (ref 4.22–5.81)
RDW: 16.3 % — ABNORMAL HIGH (ref 11.5–15.5)
WBC: 4.4 10*3/uL (ref 4.0–10.5)

## 2017-10-31 LAB — POC OCCULT BLOOD, ED: Fecal Occult Bld: NEGATIVE

## 2017-10-31 LAB — PREPARE RBC (CROSSMATCH)

## 2017-10-31 LAB — PROTIME-INR
INR: 0.88
PROTHROMBIN TIME: 11.9 s (ref 11.4–15.2)

## 2017-10-31 MED ORDER — SODIUM CHLORIDE 0.9 % IV SOLN
10.0000 mL/h | Freq: Once | INTRAVENOUS | Status: AC
  Administered 2017-11-01: 10 mL/h via INTRAVENOUS

## 2017-10-31 MED ORDER — FUROSEMIDE 10 MG/ML IJ SOLN
40.0000 mg | Freq: Once | INTRAMUSCULAR | Status: AC
  Administered 2017-10-31: 40 mg via INTRAVENOUS
  Filled 2017-10-31: qty 4

## 2017-10-31 MED ORDER — POTASSIUM CHLORIDE CRYS ER 20 MEQ PO TBCR
60.0000 meq | EXTENDED_RELEASE_TABLET | ORAL | Status: AC
  Administered 2017-11-01: 60 meq via ORAL
  Filled 2017-10-31: qty 3

## 2017-10-31 NOTE — ED Notes (Signed)
Bed: AP01 Expected date:  Expected time:  Means of arrival:  Comments: 60M low hgb from SNF

## 2017-10-31 NOTE — H&P (Signed)
History and Physical    Gary [name] ZOX:[id number] DOB: 07-19-1935 DOA: 10/31/2017  Referring MD/NP/PA: Trudie Buckler, MD PCP: Lorre Munroe, NP  Patient coming from: Nursing facility via EMS  Chief Complaint: Low blood count  I have personally briefly reviewed patient's old medical records in [facility]   HPI: Gary [name] is a 82 y.o. male with medical history significant of HTN, HLD, CAD, PAF not on anticoagulation, HFpEF last EF noted to be 55 to 60% with grade 1 DD, alcohol abuse, and PVD; who presents with after hemoglobin was reportedly found to be 6.8.  Patient had been recently hospitalized after being found on the floor with a NSTEMI from 9/4-9/11.  He was admitted to the cardiology service and underwent cardiac cath on 9/6 showing severe stenosis of the diagonal branch of the LAD and moderate calcified stenosis of mid circumflex, ostial LAD, and left main for which medical therapy was recommended.  During the hospitalization patient also went into atrial fibrillation, but was not started on anticoagulation due to history of anemia and frailty.  Patient was discharged to [facility] pace following the hospitalization.  Since that time patient had been declining he previously was able to walk with a walker and was supposed to be having rehab.  They report he has had decreased appetite and weight loss.  He complains of some shortness of breath, minimally productive cough, mild hemoptysis, and some lower extremity swelling.  They checked chest x-rays last week, and told him he had pneumonia.  They had him on oxygen at the nursing facility until this morning.  Patient denies having any abdominal pain, nausea, vomiting, diarrhea, or dysuria symptoms.  Family notes that he has not been able to drink any alcohol for 3 weeks now.  They also previously had him worked up as he has been losing weight over the last year and no cause of symptoms was noted.  ED Course: Upon  admission into the emergency department patient was noted to be afebrile, pulse 66-67, respirations 19-27, blood pressures 99/55 to 104/55, and O2 saturations 97% on room air.  Labs revealed WBC 4.4, hemoglobin 7.3, platelets 134, potassium 3.1, chloride 99, BUN 30, and creatinine 2.43.  Chest x-ray showing acute CHF with bilateral pulmonary edema and pleural effusions.  Patient was given 40 mg of Lasix IV and ordered to be transfused 1 unit of packed red blood cells.  TRH called to admit  Review of Systems  Constitutional: Positive for malaise/fatigue and weight loss. Negative for fever.  HENT: Positive for congestion. Negative for nosebleeds.   Eyes: Negative for photophobia and pain.  Respiratory: Positive for cough, hemoptysis, sputum production and shortness of breath.   Cardiovascular: Positive for leg swelling. Negative for chest pain.  Gastrointestinal: Negative for abdominal pain, diarrhea and vomiting.  Genitourinary: Negative for dysuria and frequency.  Musculoskeletal: Positive for joint pain and myalgias. Negative for falls.  Skin: Negative for rash.  Neurological: Positive for weakness. Negative for loss of consciousness.  Psychiatric/Behavioral: Negative for memory loss and substance abuse.    Past Medical History:  Diagnosis Date  . Alcohol abuse   . Carotid artery disease (HCC)   . COPD (chronic obstructive pulmonary disease) (HCC)    emphysematous changes on past CT  . Coronary artery disease 04/27/2011   Low risk Myoview in 2010:Non-obstructive disase, except for small OMB 95% ostial lesion  . Dyslipidemia   . GERD (gastroesophageal reflux disease)   . H/O ETOH abuse   .  Hypertension   . Pneumonia   . PVD (peripheral vascular disease) (HCC) 04/27/2011   normal renal & abdominal aortography; s/p Left Fem-Pop BPG 2004.    Past Surgical History:  Procedure Laterality Date  . ABDOMINAL AORTAGRAM N/A 06/25/2012   Procedure: ABDOMINAL Ronny Flurry;  Surgeon: Runell Gess, MD;  Location: [facility] CATH LAB;  Service: Cardiovascular;  Laterality: N/A;  . CARDIAC CATHETERIZATION  04/27/2011   normal L main & LAD; L Cfx with minor irregularities, small marginal branch with 95% osital stenosis, RCA is nondominant & normal, LVEF 60% (Dr. Erlene Quan)  . CAROTID ANGIOGRAM N/A 08/04/2013   Procedure: CAROTID ANGIOGRAM;  Surgeon: Runell Gess, MD;  Location: [facility] CATH LAB;  Service: Cardiovascular;  Laterality: N/A;  . Carotid Doppler  01/2010   R subclavian 50-69% diameter reduction, R prox ICA 50-69% diameter reduction, L prox ICA 50-69% diameter reduction  . COLONOSCOPY    . ENDARTERECTOMY FEMORAL Right 11/17/2013   Procedure: ENDARTERECTOMY FEMORAL;  Surgeon: Larina Earthly, MD;  Location[facility] OR;  Service: Vascular;  Laterality: Right;  . FEMORAL-POPLITEAL BYPASS GRAFT Left 11/05/2002   Dr. Josephina Gip  . FEMORAL-POPLITEAL BYPASS GRAFT Right 11/17/2013   Procedure: BYPASS GRAFT FEMORAL-POPLITEAL ARTERY;  Surgeon: Larina Earthly, MD;  Location: [facility] OR;  Service: Vascular;  Laterality: Right;  . FOOT SURGERY    . LEFT HEART CATH AND CORONARY ANGIOGRAPHY N/A 10/12/2017   Procedure: LEFT HEART CATH AND CORONARY ANGIOGRAPHY;  Surgeon: Tonny Bollman, MD;  Location: [facility] INVASIVE CV LAB;  Service: Cardiovascular;  Laterality: N/A;  . LEFT HEART CATHETERIZATION WITH CORONARY ANGIOGRAM N/A 04/27/2011   Procedure: LEFT HEART CATHETERIZATION WITH CORONARY ANGIOGRAM;  Surgeon: Runell Gess, MD;  Location: [facility] CATH LAB;  Service: Cardiovascular;  Laterality: N/A;  . LOWER EXTREMITY ANGIOGRAM  10/13/2010   patent left fem-pop bypass graft with moderate calcified prox disease in distal common femoral just prox to graft anastomosis, mod high-grade segmental mid and distal R SFA disease, 2-3 vessel runoff with patent diffusely disease posterior tibilalis bilaterally (Dr. Erlene Quan)  . LOWER EXTREMITY ANGIOGRAM N/A 08/04/2013   Procedure: LOWER EXTREMITY ANGIOGRAM;  Surgeon: Runell Gess, MD;   Location: [facility] CATH LAB;  Service: Cardiovascular;  Laterality: N/A;  . Lower Extremity Arterial Doppler  08/2012   R CFA & Profunda Femoral - diffuse irregular mixed density plaque; R SFA distal 70-99% diameter reduction; R PTA appears occluded; L CFA distal/Prox Anastomosis 70-99% diameter reduction; L Fem-Pop BPG open & patent; bilat TBBIs with inadequate perfusion for healing   . NASAL SINUS SURGERY    . NM MYOCAR PERF WALL MOTION  01/2009   dipyridamole; normal pattern of perfusion in all regions; post-stress EF 75%; normal study, low risk   . PERCUTANEOUS STENT INTERVENTION  11/19/2013   Procedure: PERCUTANEOUS STENT INTERVENTION;  Surgeon: Fransisco Hertz, MD;  Location: [facility] CATH LAB;  Service: Cardiovascular;;  superior mesenteric artery  . RENAL ANGIOGRAM  06/25/2012   normal renal arteries  . Renal Doppler  05/2012   SMA - elevated velocities >60% diameter reduction; bilat renal arteries with 60-99% diameter reduction; R & L kidneys are normal in size & symmetrical  . TRANSTHORACIC ECHOCARDIOGRAM  08/14/2012   EF 55-60%, mod LVH, grade 1 diastolic dysfunction; vent septum thickness mod increased; calcified MV annulus & mild MR; RV systolic pressure increased - mild pulm HTN; PA peak pressure  . VISCERAL ANGIOGRAM N/A 11/19/2013   Procedure: VISCERAL ANGIOGRAM;  Surgeon: Ottie Glazier  Imogene Burn, MD;  Location: [facility] CATH LAB;  Service: Cardiovascular;  Laterality: N/A;     reports that he quit smoking about 20 years ago. His smoking use included cigarettes. He has a 50.00 pack-year smoking history. He quit smokeless tobacco use about 19 years ago. He reports that he drinks about 2.0 standard drinks of alcohol per week. He reports that he has current or past drug history. Drug: Marijuana.  Allergies  Allergen Reactions  . Contrast Media [Iodinated Diagnostic Agents] Itching  . Diovan [Valsartan] Swelling    angioedema  . Neomycin Hives  . Neosporin [Neomycin-Bacitracin Zn-Polymyx] Hives    Family  History  Problem Relation Age of Onset  . CAD Brother        also stroke  . Hypertension Brother   . Diabetes Brother   . Hypertension Father        also stroke  . Stroke Father   . Kidney cancer Mother   . Cancer Mother        Renal  . Cancer Sister        Lung  . COPD Sister   . Hypertension Sister   . Hypertension Brother         X 2    Prior to Admission medications   Medication Sig Start Date End Date Taking? Authorizing Provider  amiodarone (PACERONE) 200 MG tablet Take 400 mg by mouth 2 (two) times daily.   Yes [provider]  atorvastatin (LIPITOR) 80 MG tablet Take 80 mg by mouth daily.   Yes [provider]  clopidogrel (PLAVIX) 75 MG tablet TAKE 1 TABLET BY MOUTH EVERY DAY**PT NEEDS APPT FOR FURTHER REFILLS PER DOCTOR** Patient taking differently: Take 75 mg by mouth daily.  07/16/17  Yes Runell Gess, MD  furosemide (LASIX) 40 MG tablet Take 40 mg by mouth 2 (two) times daily.   Yes [provider]  ipratropium-albuterol (DUONEB) 0.5-2.5 (3) MG/3ML SOLN Take 3 mLs by nebulization as directed. Inhale TID and Every 4 Hours PRN for SOB and Wheezing   Yes [provider]  iron polysaccharides (NU-IRON) 150 MG capsule Take 1 capsule (150 mg total) by mouth daily. 10/17/17  Yes Duke, Roe Rutherford, PA  isosorbide mononitrate (IMDUR) 30 MG 24 hr tablet Take 15 mg by mouth daily.   Yes [provider]  loratadine (CLARITIN) 10 MG tablet TAKE 1 TABLET (10 MG TOTAL) BY MOUTH DAILY. Patient taking differently: Take 10 mg by mouth daily as needed for allergies.  03/20/16  Yes Baity, Salvadore Oxford, NP  mometasone (NASONEX) 50 MCG/ACT nasal spray Place 2 sprays into the nose daily. Patient taking differently: Place 2 sprays into the nose as needed (allergies).  02/21/16  Yes Lorre Munroe, NP  tamsulosin (FLOMAX) 0.4 MG CAPS capsule Take 1 capsule (0.4 mg total) by mouth daily. 04/18/17  Yes Baity, Salvadore Oxford, NP  traZODone (DESYREL) 50 MG  tablet TAKE 1/2 TO 1 TABLET BY MOUTH AT BEDTIME AS NEEDED FOR SLEEP Patient taking differently: Take 50 mg by mouth at bedtime.  10/01/17  Yes Lorre Munroe, NP  amiodarone (PACERONE) 400 MG tablet Take 400 mg twice daily for 14 days. Then decrease to 400 mg daily until seen in follow up. Patient not taking: Reported on 10/31/2017 10/17/17   Marcelino Duster, PA  nitroGLYCERIN (NITROSTAT) 0.4 MG SL tablet Place 1 tablet (0.4 mg total) under the tongue every 5 (five) minutes x 3 doses as needed for chest pain. 10/17/17  Marcelino Duster, PA    Physical Exam:  Constitutional: Cachectic and chronically ill-appearing male  Vitals:   10/31/17 2052 10/31/17 2100 10/31/17 2145  BP: (!) 99/55 (!) 102/54 (!) 104/55  Pulse: 67 66 66  Resp: (!) 23 19 (!) 27  Temp: 98.2 F (36.8 C)    TempSrc: Oral    SpO2: 97% 97% 97%   Eyes: PERRL, lids and conjunctivae normal ENMT: Mucous membranes are dry. Posterior pharynx clear of any exudate or lesions.    Neck: normal, supple, no masses, no thyromegaly Respiratory: Coarse breath sounds with bibasilar rales present.  Patient currently on 2 L of nasal cannula oxygen. Cardiovascular: Regular rate and rhythm, no murmurs / rubs / gallops.  1+ pitting lower extremity edema most notably on the right. 2+ pedal pulses. No carotid bruits.  Abdomen: no tenderness, no masses palpated. No hepatosplenomegaly. Bowel sounds positive.  Musculoskeletal: no clubbing / cyanosis. No joint deformity upper and lower extremities. Good ROM, no contractures. Normal muscle tone.  Skin: no rashes, lesions, ulcers. No induration Neurologic: CN 2-12 grossly intact. Sensation intact, DTR normal. Strength 5/5 in all 4.  Psychiatric: Normal judgment and insight. Alert and oriented x 3. Normal mood.     Labs on Admission: I have personally reviewed following labs and imaging studies  CBC: Recent Labs  Lab 10/31/17 2129  WBC 4.4  NEUTROABS 3.5  HGB 7.3*  HCT 22.1*  MCV 90.2   PLT 131*   Basic Metabolic Panel: Recent Labs  Lab 10/31/17 2129  NA 134*  K 3.1*  CL 99  CO2 27  GLUCOSE 96  BUN 30*  CREATININE 2.43*  CALCIUM 7.8*   GFR: CrCl cannot be calculated (Unknown ideal weight.). Liver Function Tests: Recent Labs  Lab 10/31/17 2129  AST 24  ALT 17  ALKPHOS 38  BILITOT 0.7  PROT 4.7*  ALBUMIN 2.4*   No results for input(s): LIPASE, AMYLASE in the last 168 hours. No results for input(s): AMMONIA in the last 168 hours. Coagulation Profile: Recent Labs  Lab 10/31/17 2129  INR 0.88   Cardiac Enzymes: No results for input(s): CKTOTAL, CKMB, CKMBINDEX, TROPONINI in the last 168 hours. BNP (last 3 results) No results for input(s): PROBNP in the last 8760 hours. HbA1C: No results for input(s): HGBA1C in the last 72 hours. CBG: No results for input(s): GLUCAP in the last 168 hours. Lipid Profile: No results for input(s): CHOL, HDL, LDLCALC, TRIG, CHOLHDL, LDLDIRECT in the last 72 hours. Thyroid Function Tests: No results for input(s): TSH, T4TOTAL, FREET4, T3FREE, THYROIDAB in the last 72 hours. Anemia Panel: No results for input(s): VITAMINB12, FOLATE, FERRITIN, TIBC, IRON, RETICCTPCT in the last 72 hours. Urine analysis:    Component Value Date/Time   COLORURINE YELLOW 10/10/2017 2137   APPEARANCEUR CLEAR 10/10/2017 2137   LABSPEC 1.017 10/10/2017 2137   PHURINE 5.0 10/10/2017 2137   GLUCOSEU 50 (A) 10/10/2017 2137   HGBUR MODERATE (A) 10/10/2017 2137   BILIRUBINUR NEGATIVE 10/10/2017 2137   KETONESUR 20 (A) 10/10/2017 2137   PROTEINUR >=300 (A) 10/10/2017 2137   NITRITE NEGATIVE 10/10/2017 2137   LEUKOCYTESUR NEGATIVE 10/10/2017 2137   Sepsis Labs: No results found for this or any previous visit (from the past 240 hour(s)).   Radiological Exams on Admission: Dg Chest Port 1 View  Result Date: 10/31/2017 CLINICAL DATA:  82 year old nursing home patient with anemia (hemoglobin 6.8). Rales on clinical auscultation. EXAM:  PORTABLE CHEST 1 VIEW COMPARISON:  08/24/2017, 04/18/2015 and earlier. FINDINGS:  Cardiac silhouette moderately to markedly enlarged, unchanged when allowing for differences in technique. Severe thoracic and proximal abdominal aortic atherosclerosis and severe atherosclerosis involving the subclavian and axillary arteries. Interval development of interstitial and perihilar airspace pulmonary edema and associated BILATERAL pleural effusions. Fluid in the minor fissure on the RIGHT gives an appearance of a pseudotumor. IMPRESSION: Acute CHF, with stable moderate cardiomegaly and diffuse interstitial and airspace pulmonary edema associated with BILATERAL pleural effusions. Electronically Signed   By: Hulan Saas M.D.   On: 10/31/2017 21:32    EKG: Independently reviewed.  Sinus rhythm at 71 bpm  Assessment/Plan Diastolic congestive heart failure exacerbation: Acute.  Patient presents with complaints of shortness of breath.  Chest x-ray showing pulmonary edema with bilateral pleural effusions.  Patient just recently had echocardiogram on 9/6 showing EF of 55 - 60% with grade 1 diastolic dysfunction. - Admit to a telemetry bed - Heart failure orders set  initiated  - Continuous pulse oximetry with nasal cannula oxygen as needed to keep O2 saturations >92% - Strict I&Os and daily weights - Elevate lower extremities - Lasix 40 mg IV Bid - Reassess in a.m. and adjust diuresis as needed. - Message sent for cardiology to eval in a.m.  Anemia of chronic disease: Acute on chronic.  Initial hemoglobin noted to be 7.3. Previously noted to be 8.4 on 9/18.  Stool guaiacs were noted to be negative.  Given history of cardiac disease: Hemoglobin 8.  Orders written to transfuse 1 unit of blood. - Continue with transfusion of 1 unit of packed red blood cells - Continue to monitor and transfuse as needed  CAD: Patient just recently had NSTEMI and underwent left heart cath on 9/6 for which he was noted to have  nonobstructive disease and recommended medical management. -Trend troponins - Continue isosorbide mononitrate, Plavix, and statin  Hypokalemia: Acute.  Initial potassium 3.1 - Give 60 mEq of potassium chloride orally now - Continue to monitor and replace as needed  Chronic kidney disease stage IV: Patient creatinine on admission 2.43 with BUN 30.  Baseline creatinine prior to cardiac cath was noted to be 1.7.  Suspect possible hypoperfusion due to congestive heart failure exacerbation and recent contrast as cause of worsening kidney function. - Continue to monitor kidney function with diuresis  Weight loss and failure to thrive: Acute on chronic.  Unclear cause of patient's symptoms.  Family previously  Paroxysmal atrial fibrillation: Patient currently in sinus rhythm.  CHA2DS2-VASc score =4.  Not started on anticoagulation due to frailty and issues with anemia. - Continue amiodarone  Thrombocytopenia: Initial platelet count 131 on admission.  No reports of bleeding.  Thrombocytopenia likely related with history of alcoholic abuse. - Continue to monitor  History of alcohol abuse: Has not had alcohol in 3 weeks now. - Counsel on need of cessation of alcohol use  Essential hypertension - Continue medication as seen above  Malnutrition - Check prealbumin in a.m.  Hyperlipidemia - Continue atorvastatin  DVT prophylaxis: Heparin Code Status: DNR Family Communication: Discussed plan of care with patient family present at bedside Disposition Plan: Likely discharge back to skilled nursing facility Consults called: None Admission status: Inpatient  Clydie Braun MD [facility] Pager [phone]   If 7PM-7AM, please contact night-coverage [email or web address] Password [facility]  10/31/2017, 11:48 PM

## 2017-10-31 NOTE — ED Notes (Signed)
Gave pt water to drink 

## 2017-10-31 NOTE — ED Provider Notes (Signed)
[facility]-EMERGENCY DEPT Provider Note   CSN: [id number] Arrival date & time: 10/31/17  2033     History   Chief Complaint Chief Complaint  Patient presents with  . Hgb 6.8    HPI Gary [name] is a 82 y.o. male.  HPI Patient is brought from [facility] for low hemoglobin.  Reportedly it was checked and found to be 6.8.  Patient reports he has had very dark looking stool.  He denies any vomiting.  He denies abdominal pain.  He endorses some shortness of breath.  He denies chest pain.  Patient had an MI at the beginning of the month and was discharged 59\11.  He was discharged on Plavix and amiodarone.  He had an STEMI with lesion non-amenable to PCI.  Due to anemia patient was not discharged on aspirin but Plavix.  At that time, patient also had new onset paroxysmal atrial fibrillation. Past Medical History:  Diagnosis Date  . Alcohol abuse   . Carotid artery disease (HCC)   . COPD (chronic obstructive pulmonary disease) (HCC)    emphysematous changes on past CT  . Coronary artery disease 04/27/2011   Low risk Myoview in 2010:Non-obstructive disase, except for small OMB 95% ostial lesion  . Dyslipidemia   . GERD (gastroesophageal reflux disease)   . H/O ETOH abuse   . Hypertension   . Pneumonia   . PVD (peripheral vascular disease) (HCC) 04/27/2011   normal renal & abdominal aortography; s/p Left Fem-Pop BPG 2004.    Patient Active Problem List   Diagnosis Date Noted  . Anemia 10/17/2017  . SVT (supraventricular tachycardia) (HCC) 10/17/2017  . Paroxysmal atrial fibrillation (HCC) 10/17/2017  . Stage 4 chronic kidney disease (HCC)   . Pressure injury of skin 10/11/2017  . NSTEMI (non-ST elevated myocardial infarction) (HCC) 10/10/2017  . Malnutrition of moderate degree (HCC) 09/25/2017  . Alcohol abuse 04/18/2015  . COPD (chronic obstructive pulmonary disease) (HCC) 04/18/2015  . Insomnia 10/31/2014  . BPH (benign prostatic hyperplasia)  10/31/2014  . Carotid artery disease (HCC) 04/29/2013  . HLD (hyperlipidemia) 06/25/2012  . GERD (gastroesophageal reflux disease) 04/28/2011  . PVD, LFBPG 2004, dopplers show progression of disease bliat 04/27/2011  . HTN  04/27/2011    Past Surgical History:  Procedure Laterality Date  . ABDOMINAL AORTAGRAM N/A 06/25/2012   Procedure: ABDOMINAL Ronny Flurry;  Surgeon: Runell Gess, MD;  Location: [facility] CATH LAB;  Service: Cardiovascular;  Laterality: N/A;  . CARDIAC CATHETERIZATION  04/27/2011   normal L main & LAD; L Cfx with minor irregularities, small marginal branch with 95% osital stenosis, RCA is nondominant & normal, LVEF 60% (Dr. Erlene Quan)  . CAROTID ANGIOGRAM N/A 08/04/2013   Procedure: CAROTID ANGIOGRAM;  Surgeon: Runell Gess, MD;  Location: [facility] CATH LAB;  Service: Cardiovascular;  Laterality: N/A;  . Carotid Doppler  01/2010   R subclavian 50-69% diameter reduction, R prox ICA 50-69% diameter reduction, L prox ICA 50-69% diameter reduction  . COLONOSCOPY    . ENDARTERECTOMY FEMORAL Right 11/17/2013   Procedure: ENDARTERECTOMY FEMORAL;  Surgeon: Larina Earthly, MD;  Location: [facility] OR;  Service: Vascular;  Laterality: Right;  . FEMORAL-POPLITEAL BYPASS GRAFT Left 11/05/2002   Dr. Josephina Gip  . FEMORAL-POPLITEAL BYPASS GRAFT Right 11/17/2013   Procedure: BYPASS GRAFT FEMORAL-POPLITEAL ARTERY;  Surgeon: Larina Earthly, MD;  Location: [facility] OR;  Service: Vascular;  Laterality: Right;  . FOOT SURGERY    . LEFT HEART CATH AND CORONARY ANGIOGRAPHY N/A 10/12/2017  Procedure: LEFT HEART CATH AND CORONARY ANGIOGRAPHY;  Surgeon: Tonny Bollman, MD;  Location: [facility] INVASIVE CV LAB;  Service: Cardiovascular;  Laterality: N/A;  . LEFT HEART CATHETERIZATION WITH CORONARY ANGIOGRAM N/A 04/27/2011   Procedure: LEFT HEART CATHETERIZATION WITH CORONARY ANGIOGRAM;  Surgeon: Runell Gess, MD;  Location: [facility] CATH LAB;  Service: Cardiovascular;  Laterality: N/A;  . LOWER EXTREMITY ANGIOGRAM  10/13/2010    patent left fem-pop bypass graft with moderate calcified prox disease in distal common femoral just prox to graft anastomosis, mod high-grade segmental mid and distal R SFA disease, 2-3 vessel runoff with patent diffusely disease posterior tibilalis bilaterally (Dr. Erlene Quan)  . LOWER EXTREMITY ANGIOGRAM N/A 08/04/2013   Procedure: LOWER EXTREMITY ANGIOGRAM;  Surgeon: Runell Gess, MD;  Location: [facility] CATH LAB;  Service: Cardiovascular;  Laterality: N/A;  . Lower Extremity Arterial Doppler  08/2012   R CFA & Profunda Femoral - diffuse irregular mixed density plaque; R SFA distal 70-99% diameter reduction; R PTA appears occluded; L CFA distal/Prox Anastomosis 70-99% diameter reduction; L Fem-Pop BPG open & patent; bilat TBBIs with inadequate perfusion for healing   . NASAL SINUS SURGERY    . NM MYOCAR PERF WALL MOTION  01/2009   dipyridamole; normal pattern of perfusion in all regions; post-stress EF 75%; normal study, low risk   . PERCUTANEOUS STENT INTERVENTION  11/19/2013   Procedure: PERCUTANEOUS STENT INTERVENTION;  Surgeon: Fransisco Hertz, MD;  Location: [facility] CATH LAB;  Service: Cardiovascular;;  superior mesenteric artery  . RENAL ANGIOGRAM  06/25/2012   normal renal arteries  . Renal Doppler  05/2012   SMA - elevated velocities >60% diameter reduction; bilat renal arteries with 60-99% diameter reduction; R & L kidneys are normal in size & symmetrical  . TRANSTHORACIC ECHOCARDIOGRAM  08/14/2012   EF 55-60%, mod LVH, grade 1 diastolic dysfunction; vent septum thickness mod increased; calcified MV annulus & mild MR; RV systolic pressure increased - mild pulm HTN; PA peak pressure  . VISCERAL ANGIOGRAM N/A 11/19/2013   Procedure: VISCERAL ANGIOGRAM;  Surgeon: Fransisco Hertz, MD;  Location: [facility] CATH LAB;  Service: Cardiovascular;  Laterality: N/A;        Home Medications    Prior to Admission medications   Medication Sig Start Date End Date Taking? Authorizing Provider  amiodarone (PACERONE)  200 MG tablet Take 400 mg by mouth 2 (two) times daily.   Yes [provider]  atorvastatin (LIPITOR) 80 MG tablet Take 80 mg by mouth daily.   Yes [provider]  clopidogrel (PLAVIX) 75 MG tablet TAKE 1 TABLET BY MOUTH EVERY DAY**PT NEEDS APPT FOR FURTHER REFILLS PER DOCTOR** Patient taking differently: Take 75 mg by mouth daily.  07/16/17  Yes Runell Gess, MD  furosemide (LASIX) 40 MG tablet Take 40 mg by mouth 2 (two) times daily.   Yes [provider]  ipratropium-albuterol (DUONEB) 0.5-2.5 (3) MG/3ML SOLN Take 3 mLs by nebulization as directed. Inhale TID and Every 4 Hours PRN for SOB and Wheezing   Yes [provider]  iron polysaccharides (NU-IRON) 150 MG capsule Take 1 capsule (150 mg total) by mouth daily. 10/17/17  Yes Duke, Roe Rutherford, PA  isosorbide mononitrate (IMDUR) 30 MG 24 hr tablet Take 15 mg by mouth daily.   Yes [provider]  loratadine (CLARITIN) 10 MG tablet TAKE 1 TABLET (10 MG TOTAL) BY MOUTH DAILY. Patient taking differently: Take 10 mg by mouth daily as needed for allergies.  03/20/16  Yes  Lorre Munroe, NP  mometasone (NASONEX) 50 MCG/ACT nasal spray Place 2 sprays into the nose daily. Patient taking differently: Place 2 sprays into the nose as needed (allergies).  02/21/16  Yes Lorre Munroe, NP  tamsulosin (FLOMAX) 0.4 MG CAPS capsule Take 1 capsule (0.4 mg total) by mouth daily. 04/18/17  Yes Baity, Salvadore Oxford, NP  traZODone (DESYREL) 50 MG tablet TAKE 1/2 TO 1 TABLET BY MOUTH AT BEDTIME AS NEEDED FOR SLEEP Patient taking differently: Take 50 mg by mouth at bedtime.  10/01/17  Yes Lorre Munroe, NP  amiodarone (PACERONE) 400 MG tablet Take 400 mg twice daily for 14 days. Then decrease to 400 mg daily until seen in follow up. Patient not taking: Reported on 10/31/2017 10/17/17   Marcelino Duster, PA  nitroGLYCERIN (NITROSTAT) 0.4 MG SL tablet Place 1 tablet (0.4 mg total) under the tongue every 5 (five) minutes x  3 doses as needed for chest pain. 10/17/17   Duke, Roe Rutherford, PA    Family History Family History  Problem Relation Age of Onset  . CAD Brother        also stroke  . Hypertension Brother   . Diabetes Brother   . Hypertension Father        also stroke  . Stroke Father   . Kidney cancer Mother   . Cancer Mother        Renal  . Cancer Sister        Lung  . COPD Sister   . Hypertension Sister   . Hypertension Brother         X 2    Social History Social History   Tobacco Use  . Smoking status: Former Smoker    Packs/day: 1.00    Years: 50.00    Pack years: 50.00    Types: Cigarettes    Last attempt to quit: 03/03/1997    Years since quitting: 20.6  . Smokeless tobacco: Former Neurosurgeon    Quit date: 04/27/1998  Substance Use Topics  . Alcohol use: Yes    Alcohol/week: 2.0 standard drinks    Types: 2 Cans of beer per week    Comment: " daily beer generally"  . Drug use: Yes    Types: Marijuana     Allergies   Contrast media [iodinated diagnostic agents]; Diovan [valsartan]; Neomycin; and Neosporin [neomycin-bacitracin zn-polymyx]   Review of Systems Review of Systems 10 Systems reviewed and are negative for acute change except as noted in the HPI.   Physical Exam Updated Vital Signs BP (!) 104/55   Pulse 66   Temp 98.2 F (36.8 C) (Oral)   Resp (!) 27   SpO2 97%   Physical Exam  Constitutional:  Patient is alert and answering questions.  He does not have respiratory distress at rest.  Patient is frail and deconditioned in appearance.  HENT:  Head: Normocephalic and atraumatic.  Mouth/Throat: Oropharynx is clear and moist.  Eyes: EOM are normal.  Cardiovascular:  Heart is regular.  No gross rub murmur gallop.  Pulmonary/Chest:  No respiratory distress at rest.  Patient has basilar rales to auscultation on the left.  Fair airflow on the right.  Abdominal: Soft. He exhibits no distension. There is no tenderness. There is no guarding.  Musculoskeletal:    Patient has padded dressings on both heels.  Right lower extremity has 2+ pitting edema.  Neurological:  Patient is alert.  He does assist in following commands.  He is however generally  quite weak.  He is unable to pull himself forward in the bed to sitting up position although he makes attempts by placing both hands on the bed rails.  He can move both lower extremities but they are weak.  Skin: Skin is warm and dry. There is pallor.     ED Treatments / Results  Labs (all labs ordered are listed, but only abnormal results are displayed) Labs Reviewed  COMPREHENSIVE METABOLIC PANEL - Abnormal; Notable for the following components:      Result Value   Sodium 134 (*)    Potassium 3.1 (*)    BUN 30 (*)    Creatinine, Ser 2.43 (*)    Calcium 7.8 (*)    Total Protein 4.7 (*)    Albumin 2.4 (*)    GFR calc non Af Amer 24 (*)    GFR calc Af Amer 27 (*)    All other components within normal limits  CBC WITH DIFFERENTIAL/PLATELET - Abnormal; Notable for the following components:   RBC 2.45 (*)    Hemoglobin 7.3 (*)    HCT 22.1 (*)    RDW 16.3 (*)    Platelets 131 (*)    Lymphs Abs 0.5 (*)    All other components within normal limits  PROTIME-INR  URINALYSIS, ROUTINE W REFLEX MICROSCOPIC  POC OCCULT BLOOD, ED  TYPE AND SCREEN  PREPARE RBC (CROSSMATCH)  ABO/RH    EKG None  Radiology Dg Chest Port 1 View  Result Date: 10/31/2017 CLINICAL DATA:  82 year old nursing home patient with anemia (hemoglobin 6.8). Rales on clinical auscultation. EXAM: PORTABLE CHEST 1 VIEW COMPARISON:  08/24/2017, 04/18/2015 and earlier. FINDINGS: Cardiac silhouette moderately to markedly enlarged, unchanged when allowing for differences in technique. Severe thoracic and proximal abdominal aortic atherosclerosis and severe atherosclerosis involving the subclavian and axillary arteries. Interval development of interstitial and perihilar airspace pulmonary edema and associated BILATERAL pleural effusions.  Fluid in the minor fissure on the RIGHT gives an appearance of a pseudotumor. IMPRESSION: Acute CHF, with stable moderate cardiomegaly and diffuse interstitial and airspace pulmonary edema associated with BILATERAL pleural effusions. Electronically Signed   By: Hulan Saas M.D.   On: 10/31/2017 21:32    Procedures Procedures (including critical care time) CRITICAL CARE Performed by: Arby Barrette   Total critical care time: 30  minutes  Critical care time was exclusive of separately billable procedures and treating other patients.  Critical care was necessary to treat or prevent imminent or life-threatening deterioration.  Critical care was time spent personally by me on the following activities: development of treatment plan with patient and/or surrogate as well as nursing, discussions with consultants, evaluation of patient's response to treatment, examination of patient, obtaining history from patient or surrogate, ordering and performing treatments and interventions, ordering and review of laboratory studies, ordering and review of radiographic studies, pulse oximetry and re-evaluation of patient's condition. Medications Ordered in ED Medications  0.9 %  sodium chloride infusion (has no administration in time range)  furosemide (LASIX) injection 40 mg (40 mg Intravenous Given 10/31/17 2326)     Initial Impression / Assessment and Plan / ED Course  I have reviewed the triage vital signs and the nursing notes.  Pertinent labs & imaging results that were available during my care of the patient were reviewed by me and considered in my medical decision making (see chart for details).    Consult: Reviewed Dr. Katrinka Blazing for admission.  Patient has multiple medical problems.  He does have anemia.  There is shortness of breath.  He also shows some signs of congestive heart failure.  Plan will be for transfusion as well as diuresis.  Plan to admit.  Final Clinical Impressions(s) / ED  Diagnoses   Final diagnoses:  Acute on chronic congestive heart failure, unspecified heart failure type (HCC)  Symptomatic anemia  Severe comorbid illness    ED Discharge Orders    None       Arby Barrette, MD 11/03/17 1339

## 2017-10-31 NOTE — ED Triage Notes (Signed)
Per EMS, pt from [facility], staff reports low Hgb today 6.8.  It was 8.4 on the 18th.  Pt is A&OX 4.  Reports chronic L foot pain and have been noticing blood in his saliva in the last couple of mornings.   Reggie staff from [facility] reports pt's family members is requesting to transfer pt to Hospice care.

## 2017-11-01 ENCOUNTER — Other Ambulatory Visit: Payer: Self-pay

## 2017-11-01 ENCOUNTER — Encounter (HOSPITAL_COMMUNITY): Payer: Self-pay

## 2017-11-01 DIAGNOSIS — I480 Paroxysmal atrial fibrillation: Secondary | ICD-10-CM | POA: Diagnosis not present

## 2017-11-01 DIAGNOSIS — I5033 Acute on chronic diastolic (congestive) heart failure: Secondary | ICD-10-CM | POA: Diagnosis not present

## 2017-11-01 DIAGNOSIS — R4182 Altered mental status, unspecified: Secondary | ICD-10-CM | POA: Diagnosis not present

## 2017-11-01 DIAGNOSIS — Z7902 Long term (current) use of antithrombotics/antiplatelets: Secondary | ICD-10-CM | POA: Diagnosis not present

## 2017-11-01 DIAGNOSIS — D696 Thrombocytopenia, unspecified: Secondary | ICD-10-CM | POA: Diagnosis present

## 2017-11-01 DIAGNOSIS — Z681 Body mass index (BMI) 19 or less, adult: Secondary | ICD-10-CM | POA: Diagnosis not present

## 2017-11-01 DIAGNOSIS — N184 Chronic kidney disease, stage 4 (severe): Secondary | ICD-10-CM | POA: Diagnosis not present

## 2017-11-01 DIAGNOSIS — Z7189 Other specified counseling: Secondary | ICD-10-CM | POA: Diagnosis not present

## 2017-11-01 DIAGNOSIS — Z7401 Bed confinement status: Secondary | ICD-10-CM | POA: Diagnosis not present

## 2017-11-01 DIAGNOSIS — E876 Hypokalemia: Secondary | ICD-10-CM | POA: Diagnosis present

## 2017-11-01 DIAGNOSIS — D649 Anemia, unspecified: Secondary | ICD-10-CM | POA: Diagnosis not present

## 2017-11-01 DIAGNOSIS — R404 Transient alteration of awareness: Secondary | ICD-10-CM | POA: Diagnosis not present

## 2017-11-01 DIAGNOSIS — E785 Hyperlipidemia, unspecified: Secondary | ICD-10-CM | POA: Diagnosis present

## 2017-11-01 DIAGNOSIS — I509 Heart failure, unspecified: Secondary | ICD-10-CM

## 2017-11-01 DIAGNOSIS — I2510 Atherosclerotic heart disease of native coronary artery without angina pectoris: Secondary | ICD-10-CM | POA: Diagnosis present

## 2017-11-01 DIAGNOSIS — Z66 Do not resuscitate: Secondary | ICD-10-CM | POA: Diagnosis present

## 2017-11-01 DIAGNOSIS — D638 Anemia in other chronic diseases classified elsewhere: Secondary | ICD-10-CM | POA: Diagnosis present

## 2017-11-01 DIAGNOSIS — M2550 Pain in unspecified joint: Secondary | ICD-10-CM | POA: Diagnosis not present

## 2017-11-01 DIAGNOSIS — Z79899 Other long term (current) drug therapy: Secondary | ICD-10-CM | POA: Diagnosis not present

## 2017-11-01 DIAGNOSIS — I739 Peripheral vascular disease, unspecified: Secondary | ICD-10-CM | POA: Diagnosis present

## 2017-11-01 DIAGNOSIS — Z888 Allergy status to other drugs, medicaments and biological substances status: Secondary | ICD-10-CM | POA: Diagnosis not present

## 2017-11-01 DIAGNOSIS — Z515 Encounter for palliative care: Secondary | ICD-10-CM | POA: Diagnosis not present

## 2017-11-01 DIAGNOSIS — I959 Hypotension, unspecified: Secondary | ICD-10-CM | POA: Diagnosis not present

## 2017-11-01 DIAGNOSIS — D511 Vitamin B12 deficiency anemia due to selective vitamin B12 malabsorption with proteinuria: Secondary | ICD-10-CM | POA: Diagnosis not present

## 2017-11-01 DIAGNOSIS — E860 Dehydration: Secondary | ICD-10-CM | POA: Diagnosis present

## 2017-11-01 DIAGNOSIS — F1010 Alcohol abuse, uncomplicated: Secondary | ICD-10-CM | POA: Diagnosis present

## 2017-11-01 DIAGNOSIS — R627 Adult failure to thrive: Secondary | ICD-10-CM | POA: Diagnosis present

## 2017-11-01 DIAGNOSIS — E43 Unspecified severe protein-calorie malnutrition: Secondary | ICD-10-CM | POA: Diagnosis present

## 2017-11-01 DIAGNOSIS — K219 Gastro-esophageal reflux disease without esophagitis: Secondary | ICD-10-CM | POA: Diagnosis present

## 2017-11-01 DIAGNOSIS — I252 Old myocardial infarction: Secondary | ICD-10-CM | POA: Diagnosis not present

## 2017-11-01 DIAGNOSIS — I130 Hypertensive heart and chronic kidney disease with heart failure and stage 1 through stage 4 chronic kidney disease, or unspecified chronic kidney disease: Secondary | ICD-10-CM | POA: Diagnosis present

## 2017-11-01 DIAGNOSIS — I10 Essential (primary) hypertension: Secondary | ICD-10-CM | POA: Diagnosis not present

## 2017-11-01 DIAGNOSIS — Z87891 Personal history of nicotine dependence: Secondary | ICD-10-CM | POA: Diagnosis not present

## 2017-11-01 LAB — CBC WITH DIFFERENTIAL/PLATELET
BASOS PCT: 0 %
Basophils Absolute: 0 10*3/uL (ref 0.0–0.1)
EOS ABS: 0.1 10*3/uL (ref 0.0–0.7)
Eosinophils Relative: 1 %
HCT: 33.1 % — ABNORMAL LOW (ref 39.0–52.0)
Hemoglobin: 11.2 g/dL — ABNORMAL LOW (ref 13.0–17.0)
LYMPHS ABS: 0.6 10*3/uL — AB (ref 0.7–4.0)
Lymphocytes Relative: 9 %
MCH: 29.5 pg (ref 26.0–34.0)
MCHC: 33.8 g/dL (ref 30.0–36.0)
MCV: 87.1 fL (ref 78.0–100.0)
Monocytes Absolute: 0.3 10*3/uL (ref 0.1–1.0)
Monocytes Relative: 5 %
NEUTROS ABS: 5.3 10*3/uL (ref 1.7–7.7)
NEUTROS PCT: 85 %
Platelets: 151 10*3/uL (ref 150–400)
RBC: 3.8 MIL/uL — AB (ref 4.22–5.81)
RDW: 16.5 % — ABNORMAL HIGH (ref 11.5–15.5)
WBC: 6.3 10*3/uL (ref 4.0–10.5)

## 2017-11-01 LAB — MAGNESIUM: Magnesium: 1.6 mg/dL — ABNORMAL LOW (ref 1.7–2.4)

## 2017-11-01 LAB — URINALYSIS, ROUTINE W REFLEX MICROSCOPIC
BACTERIA UA: NONE SEEN
BILIRUBIN URINE: NEGATIVE
Glucose, UA: NEGATIVE mg/dL
Hgb urine dipstick: NEGATIVE
Ketones, ur: NEGATIVE mg/dL
Leukocytes, UA: NEGATIVE
Nitrite: NEGATIVE
PROTEIN: 100 mg/dL — AB
SPECIFIC GRAVITY, URINE: 1.008 (ref 1.005–1.030)
pH: 5 (ref 5.0–8.0)

## 2017-11-01 LAB — PREALBUMIN: PREALBUMIN: 19 mg/dL (ref 18–38)

## 2017-11-01 LAB — TROPONIN I
TROPONIN I: 0.06 ng/mL — AB (ref <0.03)
Troponin I: 0.07 ng/mL (ref <0.03)
Troponin I: 0.06 ng/mL (ref <0.03)

## 2017-11-01 MED ORDER — IPRATROPIUM-ALBUTEROL 0.5-2.5 (3) MG/3ML IN SOLN
3.0000 mL | Freq: Three times a day (TID) | RESPIRATORY_TRACT | Status: DC
  Administered 2017-11-01 – 2017-11-03 (×5): 3 mL via RESPIRATORY_TRACT
  Filled 2017-11-01 (×6): qty 3

## 2017-11-01 MED ORDER — ONDANSETRON HCL 4 MG/2ML IJ SOLN: 4.0000 mg | Freq: Four times a day (QID) | INTRAMUSCULAR | Status: DC | PRN

## 2017-11-01 MED ORDER — SODIUM CHLORIDE 0.9% FLUSH: 3.0000 mL | INTRAVENOUS | Status: DC | PRN

## 2017-11-01 MED ORDER — FLUTICASONE PROPIONATE 50 MCG/ACT NA SUSP
2.0000 | Freq: Every day | NASAL | Status: DC | PRN
  Filled 2017-11-01: qty 16

## 2017-11-01 MED ORDER — SODIUM CHLORIDE 0.9 % IV SOLN: 250.0000 mL | INTRAVENOUS | Status: DC | PRN

## 2017-11-01 MED ORDER — NITROGLYCERIN 0.4 MG SL SUBL: 0.4000 mg | SUBLINGUAL_TABLET | SUBLINGUAL | Status: DC | PRN

## 2017-11-01 MED ORDER — IPRATROPIUM-ALBUTEROL 0.5-2.5 (3) MG/3ML IN SOLN: 3.0000 mL | RESPIRATORY_TRACT | Status: DC | PRN

## 2017-11-01 MED ORDER — LORATADINE 10 MG PO TABS: 10.0000 mg | ORAL_TABLET | Freq: Every day | ORAL | Status: DC | PRN

## 2017-11-01 MED ORDER — POLYSACCHARIDE IRON COMPLEX 150 MG PO CAPS
150.0000 mg | ORAL_CAPSULE | Freq: Every day | ORAL | Status: DC
  Administered 2017-11-01 – 2017-11-05 (×5): 150 mg via ORAL
  Filled 2017-11-01 (×5): qty 1

## 2017-11-01 MED ORDER — ACETAMINOPHEN 325 MG PO TABS
650.0000 mg | ORAL_TABLET | ORAL | Status: DC | PRN
  Administered 2017-11-01 – 2017-11-05 (×4): 650 mg via ORAL
  Filled 2017-11-01 (×4): qty 2

## 2017-11-01 MED ORDER — ISOSORBIDE MONONITRATE ER 30 MG PO TB24
15.0000 mg | ORAL_TABLET | Freq: Every day | ORAL | Status: DC
  Administered 2017-11-01 – 2017-11-05 (×5): 15 mg via ORAL
  Filled 2017-11-01 (×6): qty 1

## 2017-11-01 MED ORDER — GUAIFENESIN ER 600 MG PO TB12
600.0000 mg | ORAL_TABLET | Freq: Two times a day (BID) | ORAL | Status: DC
  Administered 2017-11-01 – 2017-11-04 (×8): 600 mg via ORAL
  Filled 2017-11-01 (×11): qty 1

## 2017-11-01 MED ORDER — ENSURE ENLIVE PO LIQD
237.0000 mL | Freq: Two times a day (BID) | ORAL | Status: DC
  Administered 2017-11-01 – 2017-11-05 (×5): 237 mL via ORAL
  Filled 2017-11-01 (×2): qty 237

## 2017-11-01 MED ORDER — ATORVASTATIN CALCIUM 40 MG PO TABS
80.0000 mg | ORAL_TABLET | Freq: Every day | ORAL | Status: DC
Start: 2017-11-01 — End: 2017-11-05
  Administered 2017-11-01 – 2017-11-04 (×5): 80 mg via ORAL
  Filled 2017-11-01: qty 1
  Filled 2017-11-01 (×2): qty 2
  Filled 2017-11-01: qty 1
  Filled 2017-11-01 (×2): qty 2

## 2017-11-01 MED ORDER — PANTOPRAZOLE SODIUM 40 MG PO TBEC
40.0000 mg | DELAYED_RELEASE_TABLET | Freq: Every day | ORAL | Status: DC
  Administered 2017-11-01 – 2017-11-05 (×5): 40 mg via ORAL
  Filled 2017-11-01 (×5): qty 1

## 2017-11-01 MED ORDER — AMIODARONE HCL 200 MG PO TABS
400.0000 mg | ORAL_TABLET | Freq: Two times a day (BID) | ORAL | Status: DC
  Administered 2017-11-01 (×2): 400 mg via ORAL
  Filled 2017-11-01 (×2): qty 2

## 2017-11-01 MED ORDER — SODIUM CHLORIDE 0.9% FLUSH
3.0000 mL | Freq: Two times a day (BID) | INTRAVENOUS | Status: DC
  Administered 2017-11-01 – 2017-11-05 (×9): 3 mL via INTRAVENOUS

## 2017-11-01 MED ORDER — TRAZODONE HCL 50 MG PO TABS
50.0000 mg | ORAL_TABLET | Freq: Every day | ORAL | Status: DC
  Administered 2017-11-01 – 2017-11-04 (×5): 50 mg via ORAL
  Filled 2017-11-01 (×5): qty 1

## 2017-11-01 MED ORDER — HEPARIN SODIUM (PORCINE) 5000 UNIT/ML IJ SOLN
5000.0000 [IU] | Freq: Three times a day (TID) | INTRAMUSCULAR | Status: DC
  Administered 2017-11-01 – 2017-11-05 (×11): 5000 [IU] via SUBCUTANEOUS
  Filled 2017-11-01 (×12): qty 1

## 2017-11-01 MED ORDER — CLOPIDOGREL BISULFATE 75 MG PO TABS
75.0000 mg | ORAL_TABLET | Freq: Every day | ORAL | Status: DC
  Administered 2017-11-01 – 2017-11-03 (×3): 75 mg via ORAL
  Filled 2017-11-01 (×3): qty 1

## 2017-11-01 MED ORDER — TAMSULOSIN HCL 0.4 MG PO CAPS
0.4000 mg | ORAL_CAPSULE | Freq: Every day | ORAL | Status: DC
  Administered 2017-11-01 – 2017-11-05 (×5): 0.4 mg via ORAL
  Filled 2017-11-01 (×5): qty 1

## 2017-11-01 MED ORDER — AMIODARONE HCL 200 MG PO TABS
400.0000 mg | ORAL_TABLET | Freq: Every day | ORAL | Status: DC
  Administered 2017-11-02 – 2017-11-05 (×4): 400 mg via ORAL
  Filled 2017-11-01 (×4): qty 2

## 2017-11-01 MED ORDER — FUROSEMIDE 10 MG/ML IJ SOLN
40.0000 mg | Freq: Two times a day (BID) | INTRAMUSCULAR | Status: DC
  Administered 2017-11-01 – 2017-11-02 (×3): 40 mg via INTRAVENOUS
  Filled 2017-11-01 (×3): qty 4

## 2017-11-01 MED ORDER — IPRATROPIUM-ALBUTEROL 0.5-2.5 (3) MG/3ML IN SOLN: 3.0000 mL | Freq: Three times a day (TID) | RESPIRATORY_TRACT | Status: DC

## 2017-11-01 NOTE — ED Notes (Addendum)
Per PA: Pt and family requesting the pt to have O2 placed on pt. Pts RA sat 99%. Pt reports that the O2 makes him more comfortable

## 2017-11-01 NOTE — ED Notes (Signed)
Gary [name] (Daughter): [phone]

## 2017-11-01 NOTE — ED Notes (Signed)
Chiu MD at bedside

## 2017-11-01 NOTE — ED Notes (Signed)
Pt provided with meal tray.

## 2017-11-01 NOTE — ED Notes (Signed)
Bed: WA09 Expected date:  Expected time:  Means of arrival:  Comments: 

## 2017-11-01 NOTE — ED Notes (Addendum)
Pt has not received meal tray at this time. Nutrition services called for status. Per nurtrional services: Pts diet order was changed and the tray was not sent out. Nutrition is sending tray up at this time.

## 2017-11-01 NOTE — ED Notes (Signed)
Pt requesting to have diet order changed from heart healthy to regular diet. Family encouraged to mention this to MD when he comes to see pt. If they dont get a chance to speak with MD, this RN will request diet modification.

## 2017-11-01 NOTE — ED Notes (Signed)
Family at bedside. 

## 2017-11-01 NOTE — ED Notes (Signed)
No tray has been delivered at this time.Nutritional services called again to check on status.

## 2017-11-01 NOTE — ED Notes (Addendum)
Per pt family request, Rhona Leavens MD paged to inform that family is requesting to speak with admitting provider.

## 2017-11-01 NOTE — Progress Notes (Signed)
Please call family if patient moves.

## 2017-11-01 NOTE — ED Notes (Signed)
Pharmacy called for Ensure. Per pharmacy: Will tube up ensure.

## 2017-11-01 NOTE — Progress Notes (Signed)
PROGRESS NOTE    Gary [name]  SLP:[id number] DOB: 1936-01-05 DOA: 10/31/2017 PCP: Lorre Munroe, NP    Brief Narrative:  82 y.o. male with medical history significant of HTN, HLD, CAD, PAF not on anticoagulation, HFpEF last EF noted to be 55 to 60% with grade 1 DD, alcohol abuse, and PVD; who presents with after hemoglobin was reportedly found to be 6.8.  Patient had been recently hospitalized after being found on the floor with a NSTEMI from 9/4-9/11.  He was admitted to the cardiology service and underwent cardiac cath on 9/6 showing severe stenosis of the diagonal branch of the LAD and moderate calcified stenosis of mid circumflex, ostial LAD, and left main for which medical therapy was recommended.  During the hospitalization patient also went into atrial fibrillation, but was not started on anticoagulation due to history of anemia and frailty.  Patient was discharged to [facility] pace following the hospitalization.  Since that time patient had been declining he previously was able to walk with a walker and was supposed to be having rehab.  They report he has had decreased appetite and weight loss.  He complains of some shortness of breath, minimally productive cough, mild hemoptysis, and some lower extremity swelling.  They checked chest x-rays last week, and told him he had pneumonia.  They had him on oxygen at the nursing facility until this morning.  Patient denies having any abdominal pain, nausea, vomiting, diarrhea, or dysuria symptoms.  Family notes that he has not been able to drink any alcohol for 3 weeks now.  They also previously had him worked up as he has been losing weight over the last year and no cause of symptoms was noted.  ED Course: Upon admission into the emergency department patient was noted to be afebrile, pulse 66-67, respirations 19-27, blood pressures 99/55 to 104/55, and O2 saturations 97% on room air.  Labs revealed WBC 4.4, hemoglobin 7.3, platelets 134,  potassium 3.1, chloride 99, BUN 30, and creatinine 2.43.  Chest x-ray showing acute CHF with bilateral pulmonary edema and pleural effusions.  Patient was given 40 mg of Lasix IV and ordered to be transfused 1 unit of packed red blood cells.  TRH called to admit  Assessment & Plan:   Principal Problem:   Acute exacerbation of CHF (congestive heart failure) (HCC) Active Problems:   HTN    CAD- non obstructive CAD by cath 04/19/11   Hypokalemia   Alcohol abuse   Malnutrition of moderate degree (HCC)   Stage 4 chronic kidney disease (HCC)   Anemia   Paroxysmal atrial fibrillation (HCC)   CHF (congestive heart failure) (HCC)  Diastolic congestive heart failure exacerbation:  - Presented with findings worrisome for volume overload - Continued on gentle IV lasix - Currently on [facility] - Will follow renal panel.  Anemia of chronic disease: Acute on chronic.   -Initial hemoglobin noted to be 7.3. Previously noted to be 8.4 on 9/18.  Stool guaiacs were noted to be negative.  Given history of cardiac disease: Hemoglobin 8.  Orders written to transfuse 1 unit of blood. - Transfused 1 unit of packed red blood cells - Repeat CBC in AM  CAD: Patient just recently had NSTEMI and underwent left heart cath on 9/6 for which he was noted to have nonobstructive disease and recommended medical management. -Trend troponins - Continue isosorbide mononitrate, statin, plavix  Hypokalemia: Acute.  Initial potassium 3.1 - replaced -Repeat bmet in AM  Chronic kidney disease stage IV:  Patient creatinine on admission 2.43 with BUN 30.  Baseline creatinine prior to cardiac cath was noted to be 1.7.  Suspect possible hypoperfusion due to congestive heart failure exacerbation and recent contrast as cause of worsening kidney function. - continue lasix per above -Repeat bmet in AM  Weight loss and failure to thrive: Acute on chronic.  Appears stable  Paroxysmal atrial fibrillation: Patient currently in  sinus rhythm.  CHA2DS2-VASc score =4.  Not started on anticoagulation due to frailty and issues with anemia. - amiodarone dose decreased per Cardiology -Sinus currently  Thrombocytopenia: Initial platelet count 131 on admission.  No reports of bleeding.  Thrombocytopenia likely related with history of alcoholic abuse. - Stable at this time  History of alcohol abuse: Has not had alcohol in 3 weeks now. - Counsel on need of cessation of alcohol use - Stable currently  Essential hypertension - Continue current regimen as tolerated  Malnutrition - Check prealbumin in a.m.  Hyperlipidemia - Continue atorvastatin  DVT prophylaxis: heparin subq Code Status: DNR Family Communication: Pt in room, family at bedside Disposition Plan: Uncertain at this time  Consultants:   Cardiology  Procedures:     Antimicrobials: Anti-infectives (From admission, onward)   None       Subjective: Without complaints  Objective: Vitals:   11/01/17 1446 11/01/17 1447 11/01/17 1448 11/01/17 1547  BP: (!) 103/35   (!) 117/53  Pulse:  72 71 67  Resp:  (!) 26 (!) 23 18  Temp:      TempSrc:      SpO2:  98% 100% 100%    Intake/Output Summary (Last 24 hours) at 11/01/2017 1846 Last data filed at 11/01/2017 1454 Gross per 24 hour  Intake 390 ml  Output 750 ml  Net -360 ml   There were no vitals filed for this visit.  Examination: General exam: Appears calm and comfortable  Respiratory system: Clear to auscultation. Respiratory effort normal. Cardiovascular system: S1 & S2 heard, RRR Gastrointestinal system: Abdomen is nondistended, soft and nontender. No organomegaly or masses felt. Normal bowel sounds heard. Central nervous system: Alert and oriented. No focal neurological deficits. Extremities: Symmetric 5 x 5 power. Skin: No rashes, lesions Psychiatry: Judgement and insight appear normal. Mood & affect appropriate.   Data Reviewed: I have personally reviewed following labs and  imaging studies  CBC: Recent Labs  Lab 10/31/17 2129 11/01/17 0628  WBC 4.4 6.3  NEUTROABS 3.5 5.3  HGB 7.3* 11.2*  HCT 22.1* 33.1*  MCV 90.2 87.1  PLT 131* 151   Basic Metabolic Panel: Recent Labs  Lab 10/31/17 2129 11/01/17 0628  NA 134*  --   K 3.1*  --   CL 99  --   CO2 27  --   GLUCOSE 96  --   BUN 30*  --   CREATININE 2.43*  --   CALCIUM 7.8*  --   MG  --  1.6*   GFR: CrCl cannot be calculated (Unknown ideal weight.). Liver Function Tests: Recent Labs  Lab 10/31/17 2129  AST 24  ALT 17  ALKPHOS 38  BILITOT 0.7  PROT 4.7*  ALBUMIN 2.4*   No results for input(s): LIPASE, AMYLASE in the last 168 hours. No results for input(s): AMMONIA in the last 168 hours. Coagulation Profile: Recent Labs  Lab 10/31/17 2129  INR 0.88   Cardiac Enzymes: Recent Labs  Lab 11/01/17 0201 11/01/17 0628 11/01/17 1514  TROPONINI 0.07* 0.06* 0.06*   BNP (last 3 results) No results for input(s):  PROBNP in the last 8760 hours. HbA1C: No results for input(s): HGBA1C in the last 72 hours. CBG: No results for input(s): GLUCAP in the last 168 hours. Lipid Profile: No results for input(s): CHOL, HDL, LDLCALC, TRIG, CHOLHDL, LDLDIRECT in the last 72 hours. Thyroid Function Tests: No results for input(s): TSH, T4TOTAL, FREET4, T3FREE, THYROIDAB in the last 72 hours. Anemia Panel: No results for input(s): VITAMINB12, FOLATE, FERRITIN, TIBC, IRON, RETICCTPCT in the last 72 hours. Sepsis Labs: No results for input(s): PROCALCITON, LATICACIDVEN in the last 168 hours.  No results found for this or any previous visit (from the past 240 hour(s)).   Radiology Studies: Dg Chest Port 1 View  Result Date: 10/31/2017 CLINICAL DATA:  82 year old nursing home patient with anemia (hemoglobin 6.8). Rales on clinical auscultation. EXAM: PORTABLE CHEST 1 VIEW COMPARISON:  08/24/2017, 04/18/2015 and earlier. FINDINGS: Cardiac silhouette moderately to markedly enlarged, unchanged when  allowing for differences in technique. Severe thoracic and proximal abdominal aortic atherosclerosis and severe atherosclerosis involving the subclavian and axillary arteries. Interval development of interstitial and perihilar airspace pulmonary edema and associated BILATERAL pleural effusions. Fluid in the minor fissure on the RIGHT gives an appearance of a pseudotumor. IMPRESSION: Acute CHF, with stable moderate cardiomegaly and diffuse interstitial and airspace pulmonary edema associated with BILATERAL pleural effusions. Electronically Signed   By: Hulan Saas M.D.   On: 10/31/2017 21:32    Scheduled Meds: . [START ON 11/02/2017] amiodarone  400 mg Oral Daily  . atorvastatin  80 mg Oral q1800  . clopidogrel  75 mg Oral Daily  . feeding supplement (ENSURE ENLIVE)  237 mL Oral BID BM  . furosemide  40 mg Intravenous BID  . guaiFENesin  600 mg Oral BID  . heparin  5,000 Units Subcutaneous Q8H  . ipratropium-albuterol  3 mL Nebulization TID  . iron polysaccharides  150 mg Oral Daily  . isosorbide mononitrate  15 mg Oral Daily  . pantoprazole  40 mg Oral Daily  . sodium chloride flush  3 mL Intravenous Q12H  . tamsulosin  0.4 mg Oral Daily  . traZODone  50 mg Oral QHS   Continuous Infusions: . sodium chloride       LOS: 0 days   Rickey Barbara, MD [facility] Pager On Amion  If 7PM-7AM, please contact night-coverage 11/01/2017, 6:46 PM

## 2017-11-01 NOTE — Progress Notes (Addendum)
PT Cancellation Note  Patient Details Name: Gary [name] MRN: [id number] DOB: 06-22-1935   Cancelled Treatment:    Reason Eval/Treat Not Completed: Medical issues which prohibited therapy. Patient just go to floor, daughter present. Waiting on meal.  Daughter reports patient has difficulty feeding self. Will ask for OT consult.   Rada Hay 11/01/2017, 4:47 PM Blanchard Kelch PT Acute Rehabilitation Services Pager [phone] Office [phone]

## 2017-11-01 NOTE — ED Notes (Signed)
Chiu MD paged about the possibility of downgrading pts bed assignment from Tele to Med-surg. Awaiting return call.

## 2017-11-01 NOTE — ED Notes (Signed)
Admitting MD at bedside. Family at bedside to speak with pt

## 2017-11-01 NOTE — Consult Note (Addendum)
Cardiology Consultation:   Patient ID: [name] MRN: [id number]; DOB: Mar 02, 1935  Admit date: 10/31/2017 Date of Consult: 11/01/2017  Primary Care Provider: Lorre Munroe, NP Primary Cardiologist: Nanetta Batty, MD  Primary Electrophysiologist:  None    Patient Profile:   Gary [name] is a 82 y.o. male with a hx of chronic diastolic heart failure with preserved EF, CAD s/p NSTEMI 10/12/17 with nonobstructive disease by heart cath (medical management, imdur plavix, and statin), CKD stage IV, hx of alcohol use (abstinent for 3 weeks), HTN, HLD, and paroxysmal atrial fibrillation onto on anticoagulation due to age and frailty who is being seen today for the evaluation of SOB at the request of Dr. Red Christians.  History of Present Illness:   Gary [name] was recently hospitalized and found to have a NSTEMI. Heart cath showed CAD but no targets for PCI, medical therapy was recommended. Echocardiogram showed normal LVEF but grade 1 DD, no valvular disease. He also had new onset paroxysmal atrial fibrillation that converted to NSR on amiodarone, no anticoagulation. He was discharged on plavix (no ASA due to anemia), lipitor, and PO amiodarone load.  Toprol was not continued (used for hx of SVT) in the setting of amiodarone.   Unfortunately, he returned to [facility] after labs were drawn at [facility] and found to have low Hb. On arrival, Hb was 7.3 and he received  1U PRBC. CXR completed with signs of acute congestive heart failure exacerbation. Since last discharge, his functional capacity has declined including decreased appetite and weight loss. He has not had alcohol in about 3 weeks, per family. In the ER, CXR with bilateral pulmonary edema and effusions, BNP not yet collected. Troponin mildly elevated and flat. Mg 1.6, K 3.1, Ca 7.8, and sCr 2.43.   Cardiology was consulted for management of his suspected acute on chronic diastolic heart failure. On my interview, patient denies chest pain  and trouble breathing. He is on room air. He does state that wearing [place] O2 makes him feel more comfortable. Satting 99% on room air currently. He only complains about plantar fasciitis pain. Family at bedside request regular diet.   Past Medical History:  Diagnosis Date  . Alcohol abuse   . Carotid artery disease (HCC)   . COPD (chronic obstructive pulmonary disease) (HCC)    emphysematous changes on past CT  . Coronary artery disease 04/27/2011   Low risk Myoview in 2010:Non-obstructive disase, except for small OMB 95% ostial lesion  . Dyslipidemia   . GERD (gastroesophageal reflux disease)   . H/O ETOH abuse   . Hypertension   . Pneumonia   . PVD (peripheral vascular disease) (HCC) 04/27/2011   normal renal & abdominal aortography; s/p Left Fem-Pop BPG 2004.    Past Surgical History:  Procedure Laterality Date  . ABDOMINAL AORTAGRAM N/A 06/25/2012   Procedure: ABDOMINAL Ronny Flurry;  Surgeon: Runell Gess, MD;  Location: [facility] CATH LAB;  Service: Cardiovascular;  Laterality: N/A;  . CARDIAC CATHETERIZATION  04/27/2011   normal L main & LAD; L Cfx with minor irregularities, small marginal branch with 95% osital stenosis, RCA is nondominant & normal, LVEF 60% (Dr. Erlene Quan)  . CAROTID ANGIOGRAM N/A 08/04/2013   Procedure: CAROTID ANGIOGRAM;  Surgeon: Runell Gess, MD;  Location: [facility] CATH LAB;  Service: Cardiovascular;  Laterality: N/A;  . Carotid Doppler  01/2010   R subclavian 50-69% diameter reduction, R prox ICA 50-69% diameter reduction, L prox ICA 50-69% diameter reduction  . COLONOSCOPY    .  ENDARTERECTOMY FEMORAL Right 11/17/2013   Procedure: ENDARTERECTOMY FEMORAL;  Surgeon: Larina Earthly, MD;  Location: [facility] OR;  Service: Vascular;  Laterality: Right;  . FEMORAL-POPLITEAL BYPASS GRAFT Left 11/05/2002   Dr. Josephina Gip  . FEMORAL-POPLITEAL BYPASS GRAFT Right 11/17/2013   Procedure: BYPASS GRAFT FEMORAL-POPLITEAL ARTERY;  Surgeon: Larina Earthly, MD;  Location: [facility] OR;  Service:  Vascular;  Laterality: Right;  . FOOT SURGERY    . LEFT HEART CATH AND CORONARY ANGIOGRAPHY N/A 10/12/2017   Procedure: LEFT HEART CATH AND CORONARY ANGIOGRAPHY;  Surgeon: Tonny Bollman, MD;  Location: [facility] INVASIVE CV LAB;  Service: Cardiovascular;  Laterality: N/A;  . LEFT HEART CATHETERIZATION WITH CORONARY ANGIOGRAM N/A 04/27/2011   Procedure: LEFT HEART CATHETERIZATION WITH CORONARY ANGIOGRAM;  Surgeon: Runell Gess, MD;  Location: [facility] CATH LAB;  Service: Cardiovascular;  Laterality: N/A;  . LOWER EXTREMITY ANGIOGRAM  10/13/2010   patent left fem-pop bypass graft with moderate calcified prox disease in distal common femoral just prox to graft anastomosis, mod high-grade segmental mid and distal R SFA disease, 2-3 vessel runoff with patent diffusely disease posterior tibilalis bilaterally (Dr. Erlene Quan)  . LOWER EXTREMITY ANGIOGRAM N/A 08/04/2013   Procedure: LOWER EXTREMITY ANGIOGRAM;  Surgeon: Runell Gess, MD;  Location: [facility] CATH LAB;  Service: Cardiovascular;  Laterality: N/A;  . Lower Extremity Arterial Doppler  08/2012   R CFA & Profunda Femoral - diffuse irregular mixed density plaque; R SFA distal 70-99% diameter reduction; R PTA appears occluded; L CFA distal/Prox Anastomosis 70-99% diameter reduction; L Fem-Pop BPG open & patent; bilat TBBIs with inadequate perfusion for healing   . NASAL SINUS SURGERY    . NM MYOCAR PERF WALL MOTION  01/2009   dipyridamole; normal pattern of perfusion in all regions; post-stress EF 75%; normal study, low risk   . PERCUTANEOUS STENT INTERVENTION  11/19/2013   Procedure: PERCUTANEOUS STENT INTERVENTION;  Surgeon: Fransisco Hertz, MD;  Location: [facility] CATH LAB;  Service: Cardiovascular;;  superior mesenteric artery  . RENAL ANGIOGRAM  06/25/2012   normal renal arteries  . Renal Doppler  05/2012   SMA - elevated velocities >60% diameter reduction; bilat renal arteries with 60-99% diameter reduction; R & L kidneys are normal in size & symmetrical  .  TRANSTHORACIC ECHOCARDIOGRAM  08/14/2012   EF 55-60%, mod LVH, grade 1 diastolic dysfunction; vent septum thickness mod increased; calcified MV annulus & mild MR; RV systolic pressure increased - mild pulm HTN; PA peak pressure  . VISCERAL ANGIOGRAM N/A 11/19/2013   Procedure: VISCERAL ANGIOGRAM;  Surgeon: Fransisco Hertz, MD;  Location: [facility] CATH LAB;  Service: Cardiovascular;  Laterality: N/A;     Home Medications:  Prior to Admission medications   Medication Sig Start Date End Date Taking? Authorizing Provider  amiodarone (PACERONE) 200 MG tablet Take 400 mg by mouth 2 (two) times daily.   Yes [provider]  atorvastatin (LIPITOR) 80 MG tablet Take 80 mg by mouth daily.   Yes [provider]  clopidogrel (PLAVIX) 75 MG tablet TAKE 1 TABLET BY MOUTH EVERY DAY**PT NEEDS APPT FOR FURTHER REFILLS PER DOCTOR** Patient taking differently: Take 75 mg by mouth daily.  07/16/17  Yes Runell Gess, MD  furosemide (LASIX) 40 MG tablet Take 40 mg by mouth 2 (two) times daily.   Yes [provider]  ipratropium-albuterol (DUONEB) 0.5-2.5 (3) MG/3ML SOLN Take 3 mLs by nebulization as directed. Inhale TID and Every 4 Hours PRN for SOB and Wheezing  Yes [provider]  iron polysaccharides (NU-IRON) 150 MG capsule Take 1 capsule (150 mg total) by mouth daily. 10/17/17  Yes Duke, Roe Rutherford, PA  isosorbide mononitrate (IMDUR) 30 MG 24 hr tablet Take 15 mg by mouth daily.   Yes [provider]  loratadine (CLARITIN) 10 MG tablet TAKE 1 TABLET (10 MG TOTAL) BY MOUTH DAILY. Patient taking differently: Take 10 mg by mouth daily as needed for allergies.  03/20/16  Yes Baity, Salvadore Oxford, NP  mometasone (NASONEX) 50 MCG/ACT nasal spray Place 2 sprays into the nose daily. Patient taking differently: Place 2 sprays into the nose as needed (allergies).  02/21/16  Yes Lorre Munroe, NP  tamsulosin (FLOMAX) 0.4 MG CAPS capsule Take 1 capsule (0.4 mg total) by mouth  daily. 04/18/17  Yes Baity, Salvadore Oxford, NP  traZODone (DESYREL) 50 MG tablet TAKE 1/2 TO 1 TABLET BY MOUTH AT BEDTIME AS NEEDED FOR SLEEP Patient taking differently: Take 50 mg by mouth at bedtime.  10/01/17  Yes Lorre Munroe, NP  amiodarone (PACERONE) 400 MG tablet Take 400 mg twice daily for 14 days. Then decrease to 400 mg daily until seen in follow up. Patient not taking: Reported on 10/31/2017 10/17/17   Marcelino Duster, PA  nitroGLYCERIN (NITROSTAT) 0.4 MG SL tablet Place 1 tablet (0.4 mg total) under the tongue every 5 (five) minutes x 3 doses as needed for chest pain. 10/17/17   Marcelino Duster, PA    Inpatient Medications: Scheduled Meds: . amiodarone  400 mg Oral BID  . atorvastatin  80 mg Oral q1800  . clopidogrel  75 mg Oral Daily  . feeding supplement (ENSURE ENLIVE)  237 mL Oral BID BM  . furosemide  40 mg Intravenous BID  . guaiFENesin  600 mg Oral BID  . heparin  5,000 Units Subcutaneous Q8H  . ipratropium-albuterol  3 mL Nebulization TID  . iron polysaccharides  150 mg Oral Daily  . isosorbide mononitrate  15 mg Oral Daily  . pantoprazole  40 mg Oral Daily  . sodium chloride flush  3 mL Intravenous Q12H  . tamsulosin  0.4 mg Oral Daily  . traZODone  50 mg Oral QHS   Continuous Infusions: . sodium chloride     PRN Meds: sodium chloride, acetaminophen, fluticasone, ipratropium-albuterol, loratadine, nitroGLYCERIN, ondansetron (ZOFRAN) IV, sodium chloride flush  Allergies:    Allergies  Allergen Reactions  . Contrast Media [Iodinated Diagnostic Agents] Itching  . Diovan [Valsartan] Swelling    angioedema  . Neomycin Hives  . Neosporin [Neomycin-Bacitracin Zn-Polymyx] Hives    Social History:   Social History   Socioeconomic History  . Marital status: Divorced    Spouse name: Not on file  . Number of children: 3  . Years of education: Not on file  . Highest education level: Not on file  Occupational History  . Occupation: Psychologist, occupational, pipe fitter-Retired   Social Needs  . Financial resource strain: Not on file  . Food insecurity:    Worry: Not on file    Inability: Not on file  . Transportation needs:    Medical: Not on file    Non-medical: Not on file  Tobacco Use  . Smoking status: Former Smoker    Packs/day: 1.00    Years: 50.00    Pack years: 50.00    Types: Cigarettes    Last attempt to quit: 03/03/1997    Years since quitting: 20.6  . Smokeless tobacco: Former Careers information officer  date: 04/27/1998  Substance and Sexual Activity  . Alcohol use: Yes    Alcohol/week: 2.0 standard drinks    Types: 2 Cans of beer per week    Comment: " daily beer generally"  . Drug use: Yes    Types: Marijuana  . Sexual activity: Never  Lifestyle  . Physical activity:    Days per week: Not on file    Minutes per session: Not on file  . Stress: Not on file  Relationships  . Social connections:    Talks on phone: Not on file    Gets together: Not on file    Attends religious service: Not on file    Active member of club or organization: Not on file    Attends meetings of clubs or organizations: Not on file    Relationship status: Not on file  . Intimate partner violence:    Fear of current or ex partner: Not on file    Emotionally abused: Not on file    Physically abused: Not on file    Forced sexual activity: Not on file  Other Topics Concern  . Not on file  Social History Narrative  . Not on file    Family History:    Family History  Problem Relation Age of Onset  . CAD Brother        also stroke  . Hypertension Brother   . Diabetes Brother   . Hypertension Father        also stroke  . Stroke Father   . Kidney cancer Mother   . Cancer Mother        Renal  . Cancer Sister        Lung  . COPD Sister   . Hypertension Sister   . Hypertension Brother         X 2     ROS:  Please see the history of present illness.   All other ROS reviewed and negative.     Physical Exam/Data:   Vitals:   11/01/17 0805 11/01/17 0830  11/01/17 0921 11/01/17 0938  BP: (!) 129/108   (!) 139/106  Pulse: 70 70 73 72  Resp: 11 18 (!) 22 (!) 22  Temp:      TempSrc:      SpO2: 97% 97% 97% 98%    Intake/Output Summary (Last 24 hours) at 11/01/2017 0953 Last data filed at 11/01/2017 0251 Gross per 24 hour  Intake 390 ml  Output -  Net 390 ml   There were no vitals filed for this visit. There is no height or weight on file to calculate BMI.  General: elderly male in no acute distress HEENT: normal Neck: no JVD Vascular: No carotid bruits Cardiac:  normal S1, S2; RRR; no murmur Lungs:  Respirations unlabored, diminished in bases  Abd: soft, nontender, no hepatomegaly  Ext: no edema Musculoskeletal:  No deformities, BUE and BLE strength normal and equal Skin: warm and dry  Neuro:  CNs 2-12 intact, no focal abnormalities noted Psych:  Normal affect   EKG:  The EKG was personally reviewed and demonstrates:  sinus Telemetry:  Telemetry was personally reviewed and demonstrates:  Sinus, but a complete record of telemetry is not available  Relevant CV Studies:  Echo 10/12/17: Study Conclusions  - Left ventricle: The cavity size was normal. Wall thickness was   increased in a pattern of moderate LVH. Systolic function was   normal. The estimated ejection fraction was in the range of 55%  to 60%. Wall motion was normal; there were no regional wall   motion abnormalities. Doppler parameters are consistent with   abnormal left ventricular relaxation (grade 1 diastolic   dysfunction). - Aortic valve: There was no stenosis. - Mitral valve: Moderately calcified annulus. There was mild   regurgitation. - Left atrium: The atrium was moderately dilated. - Right ventricle: The cavity size was normal. Systolic function   was normal. - Pulmonary arteries: No complete TR doppler jet so unable to   estimate PA systolic pressure. - Inferior vena cava: The vessel was normal in size. The   respirophasic diameter changes were in  the normal range (>= 50%),   consistent with normal central venous pressure. - Pericardium, extracardiac: A trivial pericardial effusion was   identified.  Impressions:  - Normal LV size with moderate LV hypertrophy. EF 55-60%. Normal RV   size and systolic function. Mild mitral regurgitation.   Left heart cath 10/12/17:  Mid LM to Dist LM lesion is 40% stenosed.  Ost LAD lesion is 50% stenosed.  Prox Cx to Mid Cx lesion is 60% stenosed.  Ost 1st Diag lesion is 30% stenosed.  Ost 2nd Diag lesion is 90% stenosed.   1.  Heavily calcified coronary arteries with mild to moderate calcific stenosis of the mid circumflex, ostial LAD, and left main. 2.  Severe stenosis of the diagonal branch of the LAD. 3.  Moderately elevated LVEDP  Recommendations: Medical therapy.  No targets for PCI.  Patient is not a candidate for surgical revascularization. Majority of CAD is nonobstructive.   Laboratory Data:  Chemistry Recent Labs  Lab 10/31/17 2129  NA 134*  K 3.1*  CL 99  CO2 27  GLUCOSE 96  BUN 30*  CREATININE 2.43*  CALCIUM 7.8*  GFRNONAA 24*  GFRAA 27*  ANIONGAP 8    Recent Labs  Lab 10/31/17 2129  PROT 4.7*  ALBUMIN 2.4*  AST 24  ALT 17  ALKPHOS 38  BILITOT 0.7   Hematology Recent Labs  Lab 10/31/17 2129 11/01/17 0628  WBC 4.4 6.3  RBC 2.45* 3.80*  HGB 7.3* 11.2*  HCT 22.1* 33.1*  MCV 90.2 87.1  MCH 29.8 29.5  MCHC 33.0 33.8  RDW 16.3* 16.5*  PLT 131* 151   Cardiac Enzymes Recent Labs  Lab 11/01/17 0201 11/01/17 0628  TROPONINI 0.07* 0.06*   No results for input(s): TROPIPOC in the last 168 hours.  BNPNo results for input(s): BNP, PROBNP in the last 168 hours.  DDimer No results for input(s): DDIMER in the last 168 hours.  Radiology/Studies:  Dg Chest Port 1 View  Result Date: 10/31/2017 CLINICAL DATA:  82 year old nursing home patient with anemia (hemoglobin 6.8). Rales on clinical auscultation. EXAM: PORTABLE CHEST 1 VIEW COMPARISON:   08/24/2017, 04/18/2015 and earlier. FINDINGS: Cardiac silhouette moderately to markedly enlarged, unchanged when allowing for differences in technique. Severe thoracic and proximal abdominal aortic atherosclerosis and severe atherosclerosis involving the subclavian and axillary arteries. Interval development of interstitial and perihilar airspace pulmonary edema and associated BILATERAL pleural effusions. Fluid in the minor fissure on the RIGHT gives an appearance of a pseudotumor. IMPRESSION: Acute CHF, with stable moderate cardiomegaly and diffuse interstitial and airspace pulmonary edema associated with BILATERAL pleural effusions. Electronically Signed   By: Hulan Saas M.D.   On: 10/31/2017 21:32    Assessment and Plan:   1. Acute on chronic diastolic heart failure - pt is not in respiratory distress - no significant lower extremity edema appreciated - BNP  not collected, would be difficult to interpret in the setting of CKD - CXR with diffuse interstitial edema - agree with gentle lasix of 40 mg IV BID - sCr 2.43 with K 3.1 - replaced per primary - I & Os not charted   2. Atrial fibrillation - sinus on telemetry, telemetry review not completed/available - consider decreaseing to 200 mg BID - no anticoagulation given anemia and frailty, DNR status   3. CAD , NSTEMI 10/2016 - D/C plavix with anemia, no recent stent placement   4. CKD stage IV - continue to monitor with lasix administration      For questions or updates, please contact [facility] Please consult [email or web address] for contact info under     Signed, Marcelino Duster, PA  11/01/2017 9:53 AM   History and all data above reviewed.  Patient examined.  I agree with the findings as above.  I have personally reviewed the recent cath images.  He is not reporting chest pain and he does not have much in the way of dyspnea.  He is nauseated, anorexic and has severe failutre to thrive.  Multiple organs involved in his  progressive decline.     The patient exam reveals COR:RRR  ,  Lungs: Decreased breath sounds with crackles at the bases  ,  Abd: Positive bowel sounds, no rebound no guarding, Ext trace edema with decreased distal pulses  .  All available labs, radiology testing, previous records reviewed. Agree with documented assessment and plan. Acute on Chronic diastolic HF:  Gentle diuresis.  I don't suspect that we will be able nor will we need to be very aggressive.  Likely change to PO diuretic in the AM.  CAD:  Medical management.  I would suggest continuing the Plavix unless there is active evidence of bleeding.  Atrial fib:  Maintaining NSR.  I will change to once daily amio.  I doubt that this is causing his anorexia as this predated the drug.  Not a DOAC/warfarin candidate.   Fayrene Fearing Ashari Llewellyn  1:23 PM  11/01/2017

## 2017-11-02 DIAGNOSIS — I959 Hypotension, unspecified: Secondary | ICD-10-CM

## 2017-11-02 LAB — BASIC METABOLIC PANEL
Anion gap: 11 (ref 5–15)
BUN: 30 mg/dL — AB (ref 8–23)
CHLORIDE: 99 mmol/L (ref 98–111)
CO2: 27 mmol/L (ref 22–32)
CREATININE: 2.35 mg/dL — AB (ref 0.61–1.24)
Calcium: 8.1 mg/dL — ABNORMAL LOW (ref 8.9–10.3)
GFR calc Af Amer: 28 mL/min — ABNORMAL LOW (ref >60)
GFR calc non Af Amer: 25 mL/min — ABNORMAL LOW (ref >60)
Glucose, Bld: 82 mg/dL (ref 70–99)
Potassium: 3.9 mmol/L (ref 3.5–5.1)
SODIUM: 137 mmol/L (ref 135–145)

## 2017-11-02 LAB — CBC
HCT: 27.6 % — ABNORMAL LOW (ref 39.0–52.0)
Hemoglobin: 9.2 g/dL — ABNORMAL LOW (ref 13.0–17.0)
MCH: 29.2 pg (ref 26.0–34.0)
MCHC: 33.3 g/dL (ref 30.0–36.0)
MCV: 87.6 fL (ref 78.0–100.0)
Platelets: 122 10*3/uL — ABNORMAL LOW (ref 150–400)
RBC: 3.15 MIL/uL — ABNORMAL LOW (ref 4.22–5.81)
RDW: 17 % — AB (ref 11.5–15.5)
WBC: 4.7 10*3/uL (ref 4.0–10.5)

## 2017-11-02 MED ORDER — BENZONATATE 100 MG PO CAPS
100.0000 mg | ORAL_CAPSULE | Freq: Three times a day (TID) | ORAL | Status: DC | PRN
  Administered 2017-11-02 – 2017-11-03 (×2): 100 mg via ORAL
  Filled 2017-11-02 (×2): qty 1

## 2017-11-02 MED ORDER — SODIUM CHLORIDE 0.9 % IV BOLUS
250.0000 mL | Freq: Once | INTRAVENOUS | Status: AC
  Administered 2017-11-02: 250 mL via INTRAVENOUS

## 2017-11-02 MED ORDER — FUROSEMIDE 20 MG PO TABS
20.0000 mg | ORAL_TABLET | Freq: Two times a day (BID) | ORAL | Status: DC
  Administered 2017-11-02 – 2017-11-05 (×7): 20 mg via ORAL
  Filled 2017-11-02 (×7): qty 1

## 2017-11-02 NOTE — Progress Notes (Signed)
PT Cancellation Note  Patient Details Name: Gary [name] MRN: [id number] DOB: February 07, 1935   Cancelled Treatment:    Reason Eval/Treat Not Completed: Other (comment), note low BP, Palliative medicine consult. Will check back tomorrow,.   Rada Hay 11/02/2017, 1:59 PM  Blanchard Kelch PT Acute Rehabilitation Services Pager [phone] Office [phone]

## 2017-11-02 NOTE — Progress Notes (Signed)
Patient's BP was 70/33 this morning. Bolus was ordered and patients BP was 87/35. The nurse paged Cardiology and they believed the morning meds dropped the patient's blood pressure and were not concerned at this time.  Will continue to monitor.

## 2017-11-02 NOTE — Progress Notes (Addendum)
Progress Note  Patient Name: Gary [name] Date of Encounter: 11/02/2017  Primary Cardiologist:  Nanetta Batty, MD  Subjective   Does not feel better, extremely weak.  Cannot move himself around in the bed  Inpatient Medications    Scheduled Meds: . amiodarone  400 mg Oral Daily  . atorvastatin  80 mg Oral q1800  . clopidogrel  75 mg Oral Daily  . feeding supplement (ENSURE ENLIVE)  237 mL Oral BID BM  . furosemide  40 mg Intravenous BID  . guaiFENesin  600 mg Oral BID  . heparin  5,000 Units Subcutaneous Q8H  . ipratropium-albuterol  3 mL Nebulization TID  . iron polysaccharides  150 mg Oral Daily  . isosorbide mononitrate  15 mg Oral Daily  . pantoprazole  40 mg Oral Daily  . sodium chloride flush  3 mL Intravenous Q12H  . tamsulosin  0.4 mg Oral Daily  . traZODone  50 mg Oral QHS   Continuous Infusions: . sodium chloride     PRN Meds: sodium chloride, acetaminophen, fluticasone, ipratropium-albuterol, loratadine, nitroGLYCERIN, ondansetron (ZOFRAN) IV, sodium chloride flush   Vital Signs    Vitals:   11/01/17 2117 11/02/17 0326 11/02/17 0505 11/02/17 0753  BP: (!) 110/43 (!) 110/43 (!) 125/55   Pulse: 69 69 72   Resp: 20 20 20    Temp: 97.8 F (36.6 C) 97.8 F (36.6 C) 98 F (36.7 C)   TempSrc: Oral Oral Oral   SpO2: 100%  99% 99%  Weight:  49.7 kg 49.5 kg   Height:  5' 7.99" (1.727 m)      Intake/Output Summary (Last 24 hours) at 11/02/2017 1048 Last data filed at 11/02/2017 0527 Gross per 24 hour  Intake -  Output 1600 ml  Net -1600 ml   Filed Weights   11/02/17 0326 11/02/17 0505  Weight: 49.7 kg 49.5 kg    Telemetry    Not on- Personally Reviewed  ECG    9/25, sinus rhythm, heart rate 75, first-degree AV block noted- Personally Reviewed  Physical Exam   General: Cachectic, extremely frail, male appearing in no acute distress. Head: Normocephalic, atraumatic.  Neck: Supple without bruits, JVD mildly elevated. Lungs:  Resp  regular and unlabored, decreased breath sounds bases with a few rales. Heart: RRR, S1, S2, no S3, S4, 2-3/6 murmur; no rub. Abdomen: Soft, non-tender, non-distended with normoactive bowel sounds. No hepatomegaly. No rebound/guarding. No obvious abdominal masses. Extremities: No clubbing, cyanosis, no edema.  4/4 distal pulses are very weak, capillary refill delayed. Neuro: Alert and oriented X 2. Moves all extremities spontaneously.  Labs    Hematology Recent Labs  Lab 10/31/17 2129 11/01/17 0628 11/02/17 0339  WBC 4.4 6.3 4.7  RBC 2.45* 3.80* 3.15*  HGB 7.3* 11.2* 9.2*  HCT 22.1* 33.1* 27.6*  MCV 90.2 87.1 87.6  MCH 29.8 29.5 29.2  MCHC 33.0 33.8 33.3  RDW 16.3* 16.5* 17.0*  PLT 131* 151 122*    Chemistry Recent Labs  Lab 10/31/17 2129 11/02/17 0339  NA 134* 137  K 3.1* 3.9  CL 99 99  CO2 27 27  GLUCOSE 96 82  BUN 30* 30*  CREATININE 2.43* 2.35*  CALCIUM 7.8* 8.1*  PROT 4.7*  --   ALBUMIN 2.4*  --   AST 24  --   ALT 17  --   ALKPHOS 38  --   BILITOT 0.7  --   GFRNONAA 24* 25*  GFRAA 27* 28*  ANIONGAP 8 11  Cardiac Enzymes Recent Labs  Lab 11/01/17 0201 11/01/17 0628 11/01/17 1514  TROPONINI 0.07* 0.06* 0.06*    Radiology    Dg Chest Port 1 View  Result Date: 10/31/2017 CLINICAL DATA:  81 year old nursing home patient with anemia (hemoglobin 6.8). Rales on clinical auscultation. EXAM: PORTABLE CHEST 1 VIEW COMPARISON:  08/24/2017, 04/18/2015 and earlier. FINDINGS: Cardiac silhouette moderately to markedly enlarged, unchanged when allowing for differences in technique. Severe thoracic and proximal abdominal aortic atherosclerosis and severe atherosclerosis involving the subclavian and axillary arteries. Interval development of interstitial and perihilar airspace pulmonary edema and associated BILATERAL pleural effusions. Fluid in the minor fissure on the RIGHT gives an appearance of a pseudotumor. IMPRESSION: Acute CHF, with stable moderate cardiomegaly  and diffuse interstitial and airspace pulmonary edema associated with BILATERAL pleural effusions. Electronically Signed   By: Hulan Saas M.D.   On: 10/31/2017 21:32     Cardiac Studies   ECHO: 10/12/2017 Study Conclusions  - Left ventricle: The cavity size was normal. Wall thickness was   increased in a pattern of moderate LVH. Systolic function was   normal. The estimated ejection fraction was in the range of 55%   to 60%. Wall motion was normal; there were no regional wall   motion abnormalities. Doppler parameters are consistent with   abnormal left ventricular relaxation (grade 1 diastolic   dysfunction). - Aortic valve: There was no stenosis. - Mitral valve: Moderately calcified annulus. There was mild   regurgitation. - Left atrium: The atrium was moderately dilated. - Right ventricle: The cavity size was normal. Systolic function   was normal. - Pulmonary arteries: No complete TR doppler jet so unable to   estimate PA systolic pressure. - Inferior vena cava: The vessel was normal in size. The   respirophasic diameter changes were in the normal range (>= 50%),   consistent with normal central venous pressure. - Pericardium, extracardiac: A trivial pericardial effusion was   identified.  Impressions:  - Normal LV size with moderate LV hypertrophy. EF 55-60%. Normal RV   size and systolic function. Mild mitral regurgitation.  Patient Profile     82 y.o. male w/ hx D-CHF, NSTEMI 10/12/2017 w/ non-obs CAD>>med rx, HTN, HLD, CKD IV, ETOH, PAF, no anticoag due to age/frailty, anemia, was admitted 09/25 w/ Hgb 7.3, CHF, Cr 2.43.  Assessment & Plan    1.  Acute on chronic diastolic CHF: -Still has volume overload by exam, continue Lasix -Had to give a small amount of fluid back due to hypotension, see below -Feels he will still be net negative for today  2.  Hypotension -Systolic blood pressure 75 after a.m. meds. -He  got Lasix 40 mg IV and Flomax 0.4 mg -Will  give 250 cc fluid bolus, otherwise per IM - Recommend moving Flomax to 2200 and giving Lasix at 8 AM and 8 PM - Patient is too weak to check orthostatics, but suspect the Flomax is the major culprit  Otherwise, per IM Principal Problem:   Acute exacerbation of CHF (congestive heart failure) (HCC) Active Problems:   HTN    CAD- non obstructive CAD by cath 04/19/11   Hypokalemia   Alcohol abuse   Malnutrition of moderate degree (HCC)   Stage 4 chronic kidney disease (HCC)   Anemia   Paroxysmal atrial fibrillation (HCC)   CHF (congestive heart failure) (HCC)    Signed, Theodore Demark , PA-C 10:48 AM 11/02/2017 Pager: [phone]  History and all data above reviewed.  Patient examined.  I  agree with the findings as above.   The patient exam reveals COR:RRR  ,  Lungs: Decreased breath sounds  ,  Abd: Positive bowel sounds, no rebound no guarding, Ext no edema  .  All available labs, radiology testing, previous records reviewed. Agree with documented assessment and plan. Hypotension:  I do not think that there is overt HF.  I will change to PO Lasix and reduce the dose.  He can be given IV if he has any increased dyspnea.    Fayrene Fearing Chasmine Lender  11:21 AM  11/02/2017

## 2017-11-02 NOTE — Progress Notes (Signed)
CM referral for CHF screen-patient from SNF. Noted CSW already following.PT cons.OT recc SNF.

## 2017-11-02 NOTE — Progress Notes (Signed)
Initial Nutrition Assessment  DOCUMENTATION CODES:   Severe malnutrition in context of chronic illness, Underweight  INTERVENTION:   Continue Ensure Enlive po BID, each supplement provides 350 kcal and 20 grams of protein Encourage PO intake, feeding assistance   NUTRITION DIAGNOSIS:   Severe Malnutrition related to chronic illness(CHF, CKD4) as evidenced by energy intake < or equal to 75% for > or equal to 1 month, severe fat depletion, severe muscle depletion.  GOAL:   Patient will meet greater than or equal to 90% of their needs  MONITOR:   PO intake, Supplement acceptance, Labs, Weight trends, Skin, I & O's  REASON FOR ASSESSMENT:   (Low BMI)   ASSESSMENT:   82 y.o. male with medical history significant of HTN, HLD, CAD, PAF not on anticoagulation, HFpEF last EF noted to be 55 to 60% with grade 1 DD, alcohol abuse, and PVD  Patient in room with daughter and son at bedside. Pt reports having neck pain, is unable to position himself in bed. Needs assistance with eating but per discussion with nurse tech pt was offered assistance but declined d/t not wanting to eat. Pt still endorses poor appetite. Will sip on liquids. Per daughter, pt was told not to use straws given swallowing difficulty. Pt has had esophageal stretching procedures in the past.  Pt was evaluated by SLP at [facility] 9/23 per pt's daughter.   Per daughter, pt will only accept sips or bites of his meals. Per tech, pt drank a whole Ensure this morning. Will continue supplement.  Per weight records, pt has lost 13 lb since 11/28/16 (11% wt loss x 11 months, insignificant for time frame).  Medications: Lasix tablet BID Labs reviewed: GFR: 25 Low Mg   NUTRITION - FOCUSED PHYSICAL EXAM:    Most Recent Value  Orbital Region  Mild depletion  Upper Arm Region  Severe depletion  Thoracic and Lumbar Region  Unable to assess  Buccal Region  Moderate depletion  Temple Region  Severe depletion  Clavicle Bone Region   Severe depletion  Clavicle and Acromion Bone Region  Severe depletion  Scapular Bone Region  Moderate depletion  Dorsal Hand  Mild depletion  Patellar Region  Unable to assess  Anterior Thigh Region  Unable to assess  Posterior Calf Region  Unable to assess  Edema (RD Assessment)  None       Diet Order:   Diet Order            Diet regular Room service appropriate? Yes; Fluid consistency: Thin  Diet effective now              EDUCATION NEEDS:   Education needs have been addressed  Skin:  Skin Assessment: Skin Integrity Issues: Skin Integrity Issues:: Stage I Stage I: sacrum  Last BM:  9/26  Height:   Ht Readings from Last 1 Encounters:  11/02/17 5' 7.99" (1.727 m)    Weight:   Wt Readings from Last 1 Encounters:  11/02/17 49.5 kg    Ideal Body Weight:  70 kg  BMI:  Body mass index is 16.6 kg/m.  Estimated Nutritional Needs:   Kcal:  1600-1800  Protein:  65-75g  Fluid:  1.6L/day   Tilda Franco, MS, RD, LDN [facility] Inpatient Clinical Dietitian Pager: [phone] After Hours Pager: [phone]

## 2017-11-02 NOTE — Evaluation (Signed)
Occupational Therapy Evaluation Patient Details Name: Gary [name] MRN: [id number] DOB: Oct 16, 1935 Today's Date: 11/02/2017    History of Present Illness 82 y.o. male with medical history significant of HTN, HLD, CAD, PAF not on anticoagulation, HFpEF last EF noted to be 55 to 60% with grade 1 DD, alcohol abuse, and PVD; who presents with after hemoglobin was reportedly found to be 6.8.    Clinical Impression   Pt admitted with the above. Pt currently with functional limitations due to the deficits listed below (see OT Problem List).  Pt will benefit from skilled OT to increase their safety and independence with ADL and functional mobility for ADL to facilitate discharge to venue listed below.      Follow Up Recommendations  SNF    Equipment Recommendations  None recommended by OT    Recommendations for Other Services       Precautions / Restrictions Precautions Precautions: Fall Restrictions Weight Bearing Restrictions: No      Mobility Bed Mobility            NT      Transfers    NT                  Balance                                           ADL either performed or assessed with clinical judgement   ADL Overall ADL's : Needs assistance/impaired Eating/Feeding: Minimal assistance;Bed level Eating/Feeding Details (indicate cue type and reason): holding ensure.  Pt able to hold toast as well with good positioning of BUE Grooming: Wash/dry face;Minimal assistance                                 General ADL Comments: OT session focused on self feeding as well as grooming in bed. OT placed pillows under arms for support in order for pt to keep arms closer to his face     Vision Patient Visual Report: No change from baseline       Perception     Praxis      Pertinent Vitals/Pain Faces Pain Scale: Hurts a little bit     Hand Dominance Right   Extremity/Trunk Assessment         Cervical /  Trunk Assessment Cervical / Trunk Assessment: Kyphotic   Communication Communication Communication: HOH   Cognition Arousal/Alertness: Awake/alert Behavior During Therapy: Flat affect Overall Cognitive Status: No family/caregiver present to determine baseline cognitive functioning                                 General Comments: pt slow to respond but appropriate when answering questions. [facility]   General Comments       Exercises     Shoulder Instructions      Home Living Family/patient expects to be discharged to:: Skilled nursing facility Living Arrangements: (lives with son who works during the day)                                      Prior Functioning/Environment Level of Independence: Independent  OT Problem List: Decreased strength;Decreased coordination;Decreased knowledge of use of DME or AE      OT Treatment/Interventions: Self-care/ADL training;Patient/family education;Energy conservation;DME and/or AE instruction;Therapeutic exercise    OT Goals(Current goals can be found in the care plan section) Acute Rehab OT Goals Patient Stated Goal: feel better  OT Frequency: Min 2X/week   Barriers to D/C: Decreased caregiver support          Co-evaluation              AM-PAC PT "6 Clicks" Daily Activity     Outcome Measure Help from another person eating meals?: A Little Help from another person taking care of personal grooming?: A Little Help from another person toileting, which includes using toliet, bedpan, or urinal?: Total Help from another person bathing (including washing, rinsing, drying)?: A Lot Help from another person to put on and taking off regular upper body clothing?: A Lot Help from another person to put on and taking off regular lower body clothing?: Total 6 Click Score: 12   End of Session Nurse Communication: Mobility status  Activity Tolerance: Patient tolerated treatment well Patient  left: in bed;with bed alarm set  OT Visit Diagnosis: Unsteadiness on feet (R26.81);History of falling (Z91.81);Muscle weakness (generalized) (M62.81);Other abnormalities of gait and mobility (R26.89)                Time: 0950-1007 OT Time Calculation (min): 17 min Charges:  OT General Charges $OT Visit: 1 Visit OT Evaluation $OT Eval Moderate Complexity: 1 Mod  Lise Auer, OT Acute Rehabilitation Services [phone] Office- [phone]     Abdiel Blackerby, Karin Golden D 11/02/2017, 2:52 PM

## 2017-11-02 NOTE — Progress Notes (Signed)
PROGRESS NOTE    Gary [name]  YNW:[id number] DOB: 1935/09/01 DOA: 10/31/2017 PCP: Lorre Munroe, NP    Brief Narrative:  82 y.o. male with medical history significant of HTN, HLD, CAD, PAF not on anticoagulation, HFpEF last EF noted to be 55 to 60% with grade 1 DD, alcohol abuse, and PVD; who presents with after hemoglobin was reportedly found to be 6.8.  Patient had been recently hospitalized after being found on the floor with a NSTEMI from 9/4-9/11.  He was admitted to the cardiology service and underwent cardiac cath on 9/6 showing severe stenosis of the diagonal branch of the LAD and moderate calcified stenosis of mid circumflex, ostial LAD, and left main for which medical therapy was recommended.  During the hospitalization patient also went into atrial fibrillation, but was not started on anticoagulation due to history of anemia and frailty.  Patient was discharged to [facility] pace following the hospitalization.  Since that time patient had been declining he previously was able to walk with a walker and was supposed to be having rehab.  They report he has had decreased appetite and weight loss.  He complains of some shortness of breath, minimally productive cough, mild hemoptysis, and some lower extremity swelling.  They checked chest x-rays last week, and told him he had pneumonia.  They had him on oxygen at the nursing facility until this morning.  Patient denies having any abdominal pain, nausea, vomiting, diarrhea, or dysuria symptoms.  Family notes that he has not been able to drink any alcohol for 3 weeks now.  They also previously had him worked up as he has been losing weight over the last year and no cause of symptoms was noted.  ED Course: Upon admission into the emergency department patient was noted to be afebrile, pulse 66-67, respirations 19-27, blood pressures 99/55 to 104/55, and O2 saturations 97% on room air.  Labs revealed WBC 4.4, hemoglobin 7.3, platelets 134,  potassium 3.1, chloride 99, BUN 30, and creatinine 2.43.  Chest x-ray showing acute CHF with bilateral pulmonary edema and pleural effusions.  Patient was given 40 mg of Lasix IV and ordered to be transfused 1 unit of packed red blood cells.  TRH called to admit  Assessment & Plan:   Principal Problem:   Acute exacerbation of CHF (congestive heart failure) (HCC) Active Problems:   HTN    CAD- non obstructive CAD by cath 04/19/11   Hypokalemia   Alcohol abuse   Malnutrition of moderate degree (HCC)   Stage 4 chronic kidney disease (HCC)   Anemia   Paroxysmal atrial fibrillation (HCC)   CHF (congestive heart failure) (HCC)  Diastolic congestive heart failure exacerbation:  - Presented with findings worrisome for volume overload - IV lasix now stopped secondary to hypotension and possible dehydration -Cardiology following -Pt given gentle IVF bolus earlier  Anemia of chronic disease: Acute on chronic.   -Initial hemoglobin noted to be 7.3. Previously noted to be 8.4 on 9/18.  Stool guaiacs were noted to be negative.  Given history of cardiac disease: Hemoglobin 8.  Orders written to transfuse 1 unit of blood. - Transfused 1 unit of packed red blood cells - Hgb this AM of 9.2  CAD: Patient just recently had NSTEMI and underwent left heart cath on 9/6 for which he was noted to have nonobstructive disease and recommended medical management. -trop stable at 0.06, denies chest pain - Continue isosorbide mononitrate, statin, plavix  Hypokalemia: Acute.  Initial potassium 3.1 - replaced -  recheck bmet in AM  Chronic kidney disease stage IV: Patient creatinine on admission 2.43 with BUN 30.  Baseline creatinine prior to cardiac cath was noted to be 1.7.  Suspect possible hypoperfusion due to congestive heart failure exacerbation and recent contrast as cause of worsening kidney function. - Given IV lasix overnight. Renal function appears somewhat improved -Repeat bmet in AM  Weight  loss and failure to thrive: Acute on chronic.  Stable. Discussed with family and patient. Concerns about long-term prognosis noted. Family is asking about Palliative Care consult. Have requested Palliative Care for goals of care  Paroxysmal atrial fibrillation: Patient currently in sinus rhythm.  CHA2DS2-VASc score =4.  Not started on anticoagulation due to frailty and issues with anemia. - amiodarone dose decreased per Cardiology -Presently stable  Thrombocytopenia: Initial platelet count 131 on admission.  No reports of bleeding.  Thrombocytopenia likely related with history of alcoholic abuse. - Repeat CBC in AM  History of alcohol abuse: Has not had alcohol in 3 weeks now. - Counsel on need of cessation of alcohol use - Presently stable  Essential hypertension - Continue current regimen as tolerated - Stable at present  Malnutrition - Check prealbumin in a.m. - Stable at present. Diet as tolerated.   Hyperlipidemia - Continue atorvastatin - Currently stable  DVT prophylaxis: heparin subq Code Status: DNR Family Communication: Pt in room, family at bedside Disposition Plan: Uncertain at this time  Consultants:   Cardiology  Palliative Care  Procedures:     Antimicrobials: Anti-infectives (From admission, onward)   None      Subjective: No complaints at present  Objective: Vitals:   11/02/17 0753 11/02/17 1109 11/02/17 1230 11/02/17 1330  BP:  (!) 70/35 (!) 87/33   Pulse:  69    Resp:      Temp:      TempSrc:      SpO2: 99%   95%  Weight:      Height:        Intake/Output Summary (Last 24 hours) at 11/02/2017 1557 Last data filed at 11/02/2017 1500 Gross per 24 hour  Intake 250.62 ml  Output 850 ml  Net -599.38 ml   Filed Weights   11/02/17 0326 11/02/17 0505  Weight: 49.7 kg 49.5 kg    Examination: General exam: Conversant, in no acute distress, emaciated  Respiratory system: normal chest rise, clear, no audible  wheezing Cardiovascular system: regular rhythm, s1-s2 Gastrointestinal system: Nondistended, nontender, pos BS Central nervous system: No seizures, no tremors Extremities: No cyanosis, no joint deformities Skin: No rashes, no pallor, poor skin turgor Psychiatry: Affect normal // no auditory hallucinations   Data Reviewed: I have personally reviewed following labs and imaging studies  CBC: Recent Labs  Lab 10/31/17 2129 11/01/17 0628 11/02/17 0339  WBC 4.4 6.3 4.7  NEUTROABS 3.5 5.3  --   HGB 7.3* 11.2* 9.2*  HCT 22.1* 33.1* 27.6*  MCV 90.2 87.1 87.6  PLT 131* 151 122*   Basic Metabolic Panel: Recent Labs  Lab 10/31/17 2129 11/01/17 0628 11/02/17 0339  NA 134*  --  137  K 3.1*  --  3.9  CL 99  --  99  CO2 27  --  27  GLUCOSE 96  --  82  BUN 30*  --  30*  CREATININE 2.43*  --  2.35*  CALCIUM 7.8*  --  8.1*  MG  --  1.6*  --    GFR: Estimated Creatinine Clearance: 17.6 mL/min (A) (by C-G formula based  on SCr of 2.35 mg/dL (H)). Liver Function Tests: Recent Labs  Lab 10/31/17 2129  AST 24  ALT 17  ALKPHOS 38  BILITOT 0.7  PROT 4.7*  ALBUMIN 2.4*   No results for input(s): LIPASE, AMYLASE in the last 168 hours. No results for input(s): AMMONIA in the last 168 hours. Coagulation Profile: Recent Labs  Lab 10/31/17 2129  INR 0.88   Cardiac Enzymes: Recent Labs  Lab 11/01/17 0201 11/01/17 0628 11/01/17 1514  TROPONINI 0.07* 0.06* 0.06*   BNP (last 3 results) No results for input(s): PROBNP in the last 8760 hours. HbA1C: No results for input(s): HGBA1C in the last 72 hours. CBG: No results for input(s): GLUCAP in the last 168 hours. Lipid Profile: No results for input(s): CHOL, HDL, LDLCALC, TRIG, CHOLHDL, LDLDIRECT in the last 72 hours. Thyroid Function Tests: No results for input(s): TSH, T4TOTAL, FREET4, T3FREE, THYROIDAB in the last 72 hours. Anemia Panel: No results for input(s): VITAMINB12, FOLATE, FERRITIN, TIBC, IRON, RETICCTPCT in the last  72 hours. Sepsis Labs: No results for input(s): PROCALCITON, LATICACIDVEN in the last 168 hours.  No results found for this or any previous visit (from the past 240 hour(s)).   Radiology Studies: Dg Chest Port 1 View  Result Date: 10/31/2017 CLINICAL DATA:  82 year old nursing home patient with anemia (hemoglobin 6.8). Rales on clinical auscultation. EXAM: PORTABLE CHEST 1 VIEW COMPARISON:  08/24/2017, 04/18/2015 and earlier. FINDINGS: Cardiac silhouette moderately to markedly enlarged, unchanged when allowing for differences in technique. Severe thoracic and proximal abdominal aortic atherosclerosis and severe atherosclerosis involving the subclavian and axillary arteries. Interval development of interstitial and perihilar airspace pulmonary edema and associated BILATERAL pleural effusions. Fluid in the minor fissure on the RIGHT gives an appearance of a pseudotumor. IMPRESSION: Acute CHF, with stable moderate cardiomegaly and diffuse interstitial and airspace pulmonary edema associated with BILATERAL pleural effusions. Electronically Signed   By: Hulan Saas M.D.   On: 10/31/2017 21:32    Scheduled Meds: . amiodarone  400 mg Oral Daily  . atorvastatin  80 mg Oral q1800  . clopidogrel  75 mg Oral Daily  . feeding supplement (ENSURE ENLIVE)  237 mL Oral BID BM  . furosemide  20 mg Oral BID  . guaiFENesin  600 mg Oral BID  . heparin  5,000 Units Subcutaneous Q8H  . ipratropium-albuterol  3 mL Nebulization TID  . iron polysaccharides  150 mg Oral Daily  . isosorbide mononitrate  15 mg Oral Daily  . pantoprazole  40 mg Oral Daily  . sodium chloride flush  3 mL Intravenous Q12H  . tamsulosin  0.4 mg Oral Daily  . traZODone  50 mg Oral QHS   Continuous Infusions: . sodium chloride       LOS: 1 day   Rickey Barbara, MD [facility] Pager On Amion  If 7PM-7AM, please contact night-coverage 11/02/2017, 3:57 PM

## 2017-11-02 NOTE — Progress Notes (Signed)
CSW consulted to assist with disposition - pt admitted from [facility] where he was for short term rehab following recent hospitalization at [facility]. Spoke with facility and pt's daughter this morning- planning to have pt return to continue rehab, however, pt's daughter notes family also wondering "if palliative or hospice is needed since he has continued to decline." Daughter states when she comes to hospital later she plans on discussing prognosis with provider in order to guide their decisions. CSW will follow to assist.  Ilean Skill, MSW, LCSW Clinical Social Work 11/02/2017 [phone]  See complete assessment from recent hospitalization below for brief history.   Clinical Social Work Assessment  Patient Details  Name: Gary [name] MRN: [id number] Date of Birth: [birth year]  Date of referral:  10/13/17               Reason for consult:  Discharge Planning, Facility Placement                    Permission sought to share information with:  Family Supports Permission granted to share information::  Yes, Verbal Permission Granted             Name::     [name]             Agency::                Relationship::  daughter             Contact Information:  [phone]  Housing/Transportation Living arrangements for the past 2 months:  Single Family Home Source of Information:  Adult Children Patient Interpreter Needed:  None Criminal Activity/Legal Involvement Pertinent to Current Situation/Hospitalization:  No - Comment as needed Significant Relationships:  Adult Children, Other Family Members Lives with:  Adult Children Do you feel safe going back to the place where you live?  Yes Need for family participation in patient care:     Care giving concerns:  Patient had several family members at bedside. Patient daughter [name] did assessment with CSW as patient ate lunch. Patient lives at home with adult son but son works during the day and patient is at home by himself.  Patient has support from other family members   Social Worker assessment / plan:  Patients daughter [name] stated patient has been at facility in the past and had a good experience. Family stated they would like patient to go to [facility] for rehab. CSW went over the process of rehab and stated patient will need to be evaluated by PT/OT so insurance is able to pay for it. CSW will follow up with family once SNF has made a bed offer   Employment status:  Retired Health and safety inspector:  Medicare PT Recommendations:  Skilled Nursing Facility Information / Referral to community resources:  Skilled Nursing Facility  Patient/Family's Response to care:  Family appreciates CSW role in care  Patient/Family's Understanding of and Emotional Response to Diagnosis, Current Treatment, and Prognosis:  Family and patient agreeable with discharge plan for patient to receive short term rehab  Emotional Assessment Appearance:  Appears stated age Attitude/Demeanor/Rapport:  Engaged Affect (typically observed):  Accepting Orientation:  Oriented to Self, Oriented to Place, Oriented to  Time, Oriented to Situation Alcohol / Substance use:  Not Applicable Psych involvement (Current and /or in the community):  No (Comment)  Discharge Needs  Concerns to be addressed:  Care Coordination Readmission within the last 30 days:  No Current discharge risk:  Dependent with Mobility Barriers  to Discharge:  Continued Medical Work up   [employer], LCSW 10/13/2017, 12:41 PM

## 2017-11-03 DIAGNOSIS — D511 Vitamin B12 deficiency anemia due to selective vitamin B12 malabsorption with proteinuria: Secondary | ICD-10-CM

## 2017-11-03 DIAGNOSIS — D649 Anemia, unspecified: Secondary | ICD-10-CM

## 2017-11-03 DIAGNOSIS — I480 Paroxysmal atrial fibrillation: Secondary | ICD-10-CM

## 2017-11-03 DIAGNOSIS — I2510 Atherosclerotic heart disease of native coronary artery without angina pectoris: Secondary | ICD-10-CM

## 2017-11-03 DIAGNOSIS — F1010 Alcohol abuse, uncomplicated: Secondary | ICD-10-CM

## 2017-11-03 DIAGNOSIS — I10 Essential (primary) hypertension: Secondary | ICD-10-CM

## 2017-11-03 DIAGNOSIS — Z515 Encounter for palliative care: Secondary | ICD-10-CM

## 2017-11-03 DIAGNOSIS — Z7189 Other specified counseling: Secondary | ICD-10-CM

## 2017-11-03 LAB — BASIC METABOLIC PANEL
Anion gap: 8 (ref 5–15)
BUN: 33 mg/dL — ABNORMAL HIGH (ref 8–23)
CO2: 30 mmol/L (ref 22–32)
CREATININE: 2.28 mg/dL — AB (ref 0.61–1.24)
Calcium: 8.2 mg/dL — ABNORMAL LOW (ref 8.9–10.3)
Chloride: 99 mmol/L (ref 98–111)
GFR, EST AFRICAN AMERICAN: 29 mL/min — AB (ref >60)
GFR, EST NON AFRICAN AMERICAN: 25 mL/min — AB (ref >60)
GLUCOSE: 86 mg/dL (ref 70–99)
Potassium: 3.4 mmol/L — ABNORMAL LOW (ref 3.5–5.1)
Sodium: 137 mmol/L (ref 135–145)

## 2017-11-03 MED ORDER — IPRATROPIUM-ALBUTEROL 0.5-2.5 (3) MG/3ML IN SOLN
3.0000 mL | Freq: Two times a day (BID) | RESPIRATORY_TRACT | Status: DC
  Administered 2017-11-03 – 2017-11-05 (×3): 3 mL via RESPIRATORY_TRACT
  Filled 2017-11-03 (×4): qty 3

## 2017-11-03 MED ORDER — MORPHINE SULFATE (PF) 2 MG/ML IV SOLN
1.0000 mg | INTRAVENOUS | Status: DC | PRN
  Administered 2017-11-03 – 2017-11-04 (×4): 1 mg via INTRAVENOUS
  Filled 2017-11-03 (×4): qty 1

## 2017-11-03 MED ORDER — POTASSIUM CHLORIDE CRYS ER 20 MEQ PO TBCR
40.0000 meq | EXTENDED_RELEASE_TABLET | Freq: Once | ORAL | Status: AC
  Administered 2017-11-03: 40 meq via ORAL
  Filled 2017-11-03: qty 2

## 2017-11-03 NOTE — Progress Notes (Signed)
Progress Note   Subjective   Weak and ill appearing,  the patient denies CP or SOB   Inpatient Medications    Scheduled Meds: . amiodarone  400 mg Oral Daily  . atorvastatin  80 mg Oral q1800  . clopidogrel  75 mg Oral Daily  . feeding supplement (ENSURE ENLIVE)  237 mL Oral BID BM  . furosemide  20 mg Oral BID  . guaiFENesin  600 mg Oral BID  . heparin  5,000 Units Subcutaneous Q8H  . ipratropium-albuterol  3 mL Nebulization TID  . iron polysaccharides  150 mg Oral Daily  . isosorbide mononitrate  15 mg Oral Daily  . pantoprazole  40 mg Oral Daily  . sodium chloride flush  3 mL Intravenous Q12H  . tamsulosin  0.4 mg Oral Daily  . traZODone  50 mg Oral QHS   Continuous Infusions: . sodium chloride     PRN Meds: sodium chloride, acetaminophen, benzonatate, fluticasone, ipratropium-albuterol, loratadine, morphine injection, nitroGLYCERIN, ondansetron (ZOFRAN) IV, sodium chloride flush   Vital Signs    Vitals:   11/02/17 1918 11/02/17 2114 11/03/17 0531 11/03/17 0738  BP:  (!) 142/55 (!) 170/66   Pulse:  65 70   Resp:  15 18   Temp:  98.3 F (36.8 C) 98 F (36.7 C)   TempSrc:  Oral Oral   SpO2: 100% 100% 98% 95%  Weight:   53.1 kg   Height:        Intake/Output Summary (Last 24 hours) at 11/03/2017 0936 Last data filed at 11/03/2017 0533 Gross per 24 hour  Intake 253.62 ml  Output 1400 ml  Net -1146.38 ml   Filed Weights   11/02/17 0326 11/02/17 0505 11/03/17 0531  Weight: 49.7 kg 49.5 kg 53.1 kg      Physical Exam   GEN- The patient is elderly and frail appearing, alert   Head- normocephalic, atraumatic Eyes-  Sclera clear, conjunctiva pink Ears- hearing intact Oropharynx- clear Neck- supple, Lungs- decreased BS at bases, normal work of breathing Heart- Regular rate and rhythm  GI- soft, NT, ND, + BS Extremities- no clubbing, cyanosis, or edema  MS- diffuse atrophy Skin- no rash or lesion Psych- euthymic mood, full affect Neuro- strength and  sensation are intact   Labs    Chemistry Recent Labs  Lab 10/31/17 2129 11/02/17 0339 11/03/17 0452  NA 134* 137 137  K 3.1* 3.9 3.4*  CL 99 99 99  CO2 27 27 30   GLUCOSE 96 82 86  BUN 30* 30* 33*  CREATININE 2.43* 2.35* 2.28*  CALCIUM 7.8* 8.1* 8.2*  PROT 4.7*  --   --   ALBUMIN 2.4*  --   --   AST 24  --   --   ALT 17  --   --   ALKPHOS 38  --   --   BILITOT 0.7  --   --   GFRNONAA 24* 25* 25*  GFRAA 27* 28* 29*  ANIONGAP 8 11 8      Hematology Recent Labs  Lab 10/31/17 2129 11/01/17 0628 11/02/17 0339  WBC 4.4 6.3 4.7  RBC 2.45* 3.80* 3.15*  HGB 7.3* 11.2* 9.2*  HCT 22.1* 33.1* 27.6*  MCV 90.2 87.1 87.6  MCH 29.8 29.5 29.2  MCHC 33.0 33.8 33.3  RDW 16.3* 16.5* 17.0*  PLT 131* 151 122*    Cardiac Enzymes Recent Labs  Lab 11/01/17 0201 11/01/17 0628 11/01/17 1514  TROPONINI 0.07* 0.06* 0.06*   No results for input(s):  TROPIPOC in the last 168 hours.     Patient Profile     82 y.o. male w/ hx D-CHF, NSTEMI 10/12/2017 w/ non-obs CAD>>med rx, HTN, HLD, CKD IV, ETOH, PAF, no anticoag due to age/frailty, anemia, was admitted 09/25 w/ Hgb 7.3, CHF, Cr 2.43.   Assessment & Plan    1.  Acute on chronic diastolic dysfunction Remains volume overloaded Exacerbated by renal disease Continue current lasix dosing  2. Anemia Per primary team  3. HTN Previously hypotensive with anemia Now elevation in BP is noted  4. Stage IV acute on chronic renal disease Limits ability to diurese Per primary team  5. Malnutrition Noted  6. Paroxysmal afib Not currently a candidate for anticoagulation Not sure why patient is still on plavix (discontinuation suggested in Dr [facility] consult note) Reduce amiodarone to 200mg  daily at discharge Will need close outpatient follow-up  7. CAD  No ischemic symptoms Stable  Prognosis is poor.  Cardiology to see again on Monday  Hillis Range MD, [facility] 11/03/2017 9:36 AM

## 2017-11-03 NOTE — Evaluation (Signed)
Physical Therapy Evaluation Patient Details Name: Gary [name] MRN: [id number] DOB: [birth year] Today's Date: 11/03/2017   History of Present Illness  82 y.o. male with medical history significant of HTN, HLD, CAD, PAF not on anticoagulation, HFpEF last EF noted to be 55 to 60% with grade 1 DD, alcohol abuse, and PVD; who presents with after hemoglobin was reportedly found to be 6.8.   Clinical Impression  Pt admitted with above diagnosis. Pt currently with functional limitations due to the deficits listed below (see PT Problem List). Pt will benefit from skilled PT to increase their independence and safety with mobility to allow discharge to the venue listed below.  Pt would benefit from an air mattress due to limited mobility.  Family is meeting with palliative care later today.  At this point, recommend SNF, but will continue to monitor.     Follow Up Recommendations SNF;Supervision/Assistance - 24 hour    Equipment Recommendations  None recommended by PT    Recommendations for Other Services       Precautions / Restrictions Precautions Precautions: Fall Restrictions Weight Bearing Restrictions: No      Mobility  Bed Mobility Overal bed mobility: Needs Assistance Bed Mobility: Rolling;Sidelying to Sit;Sit to Sidelying Rolling: Mod assist Sidelying to sit: Max assist     Sit to sidelying: Max assist General bed mobility comments: Pt sat EOB 4-5  minutes before needing to return supine. A of 2 to pull to [facility] and re-position with pillows  Transfers                    Ambulation/Gait                Stairs            Wheelchair Mobility    Modified Rankin (Stroke Patients Only)       Balance Overall balance assessment: Needs assistance   Sitting balance-Leahy Scale: Poor Sitting balance - Comments: flexed forwards Postural control: Posterior lean                                   Pertinent Vitals/Pain Pain  Assessment: Faces Faces Pain Scale: Hurts little more Pain Location: legs Pain Descriptors / Indicators: Sore Pain Intervention(s): Monitored during session;Limited activity within patient's tolerance;Repositioned    Home Living Family/patient expects to be discharged to:: Skilled nursing facility               Home Equipment: Dan Humphreys - 4 wheels;Walker - 2 wheels      Prior Function           Comments: Was independent until 3 weeks ago.  Has been at [facility] and has declined since admission there 3 weeks ago.     Hand Dominance        Extremity/Trunk Assessment   Upper Extremity Assessment Upper Extremity Assessment: Defer to OT evaluation    Lower Extremity Assessment Lower Extremity Assessment: Generalized weakness;LLE deficits/detail;RLE deficits/detail RLE Deficits / Details: limited ROM LLE Deficits / Details: limited ROM    Cervical / Trunk Assessment Cervical / Trunk Assessment: Kyphotic  Communication   Communication: HOH  Cognition Arousal/Alertness: Awake/alert Behavior During Therapy: Flat affect Overall Cognitive Status: Within Functional Limits for tasks assessed  General Comments      Exercises     Assessment/Plan    PT Assessment Patient needs continued PT services  PT Problem List Decreased strength;Decreased range of motion;Decreased activity tolerance;Decreased balance;Decreased mobility;Pain       PT Treatment Interventions Functional mobility training;Therapeutic activities;Therapeutic exercise;Balance training;Patient/family education    PT Goals (Current goals can be found in the Care Plan section)  Acute Rehab PT Goals Patient Stated Goal: to determine realistic goals PT Goal Formulation: With family Time For Goal Achievement: 11/17/17 Potential to Achieve Goals: Fair    Frequency Min 2X/week   Barriers to discharge        Co-evaluation               AM-PAC  PT "6 Clicks" Daily Activity  Outcome Measure Difficulty turning over in bed (including adjusting bedclothes, sheets and blankets)?: Unable Difficulty moving from lying on back to sitting on the side of the bed? : Unable Difficulty sitting down on and standing up from a chair with arms (e.g., wheelchair, bedside commode, etc,.)?: Unable Help needed moving to and from a bed to chair (including a wheelchair)?: Total Help needed walking in hospital room?: Total Help needed climbing 3-5 steps with a railing? : Total 6 Click Score: 6    End of Session Equipment Utilized During Treatment: Gait belt Activity Tolerance: Patient limited by fatigue Patient left: in bed;with call bell/phone within reach;with bed alarm set;with family/visitor present Nurse Communication: Mobility status;Other (comment)(recommend air mattress) PT Visit Diagnosis: Muscle weakness (generalized) (M62.81);Adult, failure to thrive (R62.7)    Time: 5093-2671 PT Time Calculation (min) (ACUTE ONLY): 26 min   Charges:   PT Evaluation $PT Eval Moderate Complexity: 1 Mod PT Treatments $Therapeutic Activity: 8-22 mins        Gary [name], [place] Pager [phone] 11/03/2017   Gary [name] 11/03/2017, 4:08 PM

## 2017-11-03 NOTE — Progress Notes (Signed)
PROGRESS NOTE    Gary [name]  UJW:[id number] DOB: 1935-04-23 DOA: 10/31/2017 PCP: Lorre Munroe, NP    Brief Narrative:  82 y.o. male with medical history significant of HTN, HLD, CAD, PAF not on anticoagulation, HFpEF last EF noted to be 55 to 60% with grade 1 DD, alcohol abuse, and PVD; who presents with after hemoglobin was reportedly found to be 6.8.  Patient had been recently hospitalized after being found on the floor with a NSTEMI from 9/4-9/11.  He was admitted to the cardiology service and underwent cardiac cath on 9/6 showing severe stenosis of the diagonal branch of the LAD and moderate calcified stenosis of mid circumflex, ostial LAD, and left main for which medical therapy was recommended.  During the hospitalization patient also went into atrial fibrillation, but was not started on anticoagulation due to history of anemia and frailty.  Patient was discharged to [facility] pace following the hospitalization.  Since that time patient had been declining he previously was able to walk with a walker and was supposed to be having rehab.  They report he has had decreased appetite and weight loss.  He complains of some shortness of breath, minimally productive cough, mild hemoptysis, and some lower extremity swelling.  They checked chest x-rays last week, and told him he had pneumonia.  They had him on oxygen at the nursing facility until this morning.  Patient denies having any abdominal pain, nausea, vomiting, diarrhea, or dysuria symptoms.  Family notes that he has not been able to drink any alcohol for 3 weeks now.  They also previously had him worked up as he has been losing weight over the last year and no cause of symptoms was noted.  ED Course: Upon admission into the emergency department patient was noted to be afebrile, pulse 66-67, respirations 19-27, blood pressures 99/55 to 104/55, and O2 saturations 97% on room air.  Labs revealed WBC 4.4, hemoglobin 7.3, platelets 134,  potassium 3.1, chloride 99, BUN 30, and creatinine 2.43.  Chest x-ray showing acute CHF with bilateral pulmonary edema and pleural effusions.  Patient was given 40 mg of Lasix IV and ordered to be transfused 1 unit of packed red blood cells.  TRH called to admit  Assessment & Plan:   Principal Problem:   Acute exacerbation of CHF (congestive heart failure) (HCC) Active Problems:   HTN    CAD- non obstructive CAD by cath 04/19/11   Hypokalemia   Alcohol abuse   Malnutrition of moderate degree (HCC)   Stage 4 chronic kidney disease (HCC)   Anemia   Paroxysmal atrial fibrillation (HCC)   CHF (congestive heart failure) (HCC)  Diastolic congestive heart failure exacerbation:  - Presented with findings worrisome for volume overload, still clinically volume overloaded - IV lasix now stopped secondary to hypotension and possible dehydration -Cardiology following -Pt given gentle IVF bolus secondary to hypotension. BP imrpoved -Presently continued on oral lasix  Anemia of chronic disease: Acute on chronic.   -Initial hemoglobin noted to be 7.3. Previously noted to be 8.4 on 9/18.  Stool guaiacs were noted to be negative.  Given history of cardiac disease: Hemoglobin 8.  Orders written to transfuse 1 unit of blood. - Transfused 1 unit of packed red blood cells - Most recent hgb of 9.2. Will repeat cbc in AM -Plavix discontinued  CAD: Patient just recently had NSTEMI and underwent left heart cath on 9/6 for which he was noted to have nonobstructive disease and recommended medical management. -trop stable  at 0.06, denies chest pain - Continue isosorbide mononitrate, statin as tolerated  Hypokalemia: Acute.  Initial potassium 3.1 - Low this AM -Replacement given -Repeat bmet in AM  Chronic kidney disease stage IV: Patient creatinine on admission 2.43 with BUN 30.  Baseline creatinine prior to cardiac cath was noted to be 1.7.  Suspect possible hypoperfusion due to congestive heart  failure exacerbation and recent contrast as cause of worsening kidney function. - Renal function improved since yesterday -Repeat bmet in AM  Weight loss and failure to thrive: Acute on chronic.  Stable. Discussed with family and patient. Concerns about long-term prognosis noted. Palliative Care consulted, to meet with pt and family today  Paroxysmal atrial fibrillation: Patient currently in sinus rhythm.  CHA2DS2-VASc score =4.  Not started on anticoagulation due to frailty and issues with anemia. - amiodarone dose decreased per Cardiology -Stable at present  Thrombocytopenia: Initial platelet count 131 on admission.  No reports of bleeding.  Thrombocytopenia likely related with history of alcoholic abuse. - will recheck CBC in AM  History of alcohol abuse: Has not had alcohol in 3 weeks now. - Counsel on need of cessation of alcohol use - Presently stable  Essential hypertension - Continue current regimen as tolerated - Earlier noted to be hypotensive, bp has improved  Malnutrition - Check prealbumin in a.m. - Cont to encourage PO as tolerated.   Hyperlipidemia - Continue atorvastatin - Presently stable  DVT prophylaxis: heparin subq Code Status: DNR Family Communication: Pt in room, family at bedside Disposition Plan: Uncertain at this time  Consultants:   Cardiology  Palliative Care  Procedures:     Antimicrobials: Anti-infectives (From admission, onward)   None      Subjective: Complains of L leg pain and requesting repositioning in bed  Objective: Vitals:   11/02/17 2114 11/03/17 0531 11/03/17 0738 11/03/17 1411  BP: (!) 142/55 (!) 170/66  (!) 143/61  Pulse: 65 70  70  Resp: 15 18  16   Temp: 98.3 F (36.8 C) 98 F (36.7 C)  99.3 F (37.4 C)  TempSrc: Oral Oral  Oral  SpO2: 100% 98% 95% 100%  Weight:  53.1 kg    Height:        Intake/Output Summary (Last 24 hours) at 11/03/2017 1756 Last data filed at 11/03/2017 1300 Gross per 24 hour    Intake 173 ml  Output 1400 ml  Net -1227 ml   Filed Weights   11/02/17 0326 11/02/17 0505 11/03/17 0531  Weight: 49.7 kg 49.5 kg 53.1 kg    Examination: General exam: Awake, laying in bed, in nad Respiratory system: Increased resp effort, decreased breath sounds, poor inspiratory effort given body habitus Cardiovascular system: regular rate, s1, s2 Gastrointestinal system: Soft, nondistended, positive BS Central nervous system: CN2-12 grossly intact, strength intact Extremities: Perfused, no clubbing Skin: Normal skin turgor, no notable skin lesions seen Psychiatry: Mood normal // no visual hallucinations   Data Reviewed: I have personally reviewed following labs and imaging studies  CBC: Recent Labs  Lab 10/31/17 2129 11/01/17 0628 11/02/17 0339  WBC 4.4 6.3 4.7  NEUTROABS 3.5 5.3  --   HGB 7.3* 11.2* 9.2*  HCT 22.1* 33.1* 27.6*  MCV 90.2 87.1 87.6  PLT 131* 151 122*   Basic Metabolic Panel: Recent Labs  Lab 10/31/17 2129 11/01/17 0628 11/02/17 0339 11/03/17 0452  NA 134*  --  137 137  K 3.1*  --  3.9 3.4*  CL 99  --  99 99  CO2  27  --  27 30  GLUCOSE 96  --  82 86  BUN 30*  --  30* 33*  CREATININE 2.43*  --  2.35* 2.28*  CALCIUM 7.8*  --  8.1* 8.2*  MG  --  1.6*  --   --    GFR: Estimated Creatinine Clearance: 19.4 mL/min (A) (by C-G formula based on SCr of 2.28 mg/dL (H)). Liver Function Tests: Recent Labs  Lab 10/31/17 2129  AST 24  ALT 17  ALKPHOS 38  BILITOT 0.7  PROT 4.7*  ALBUMIN 2.4*   No results for input(s): LIPASE, AMYLASE in the last 168 hours. No results for input(s): AMMONIA in the last 168 hours. Coagulation Profile: Recent Labs  Lab 10/31/17 2129  INR 0.88   Cardiac Enzymes: Recent Labs  Lab 11/01/17 0201 11/01/17 0628 11/01/17 1514  TROPONINI 0.07* 0.06* 0.06*   BNP (last 3 results) No results for input(s): PROBNP in the last 8760 hours. HbA1C: No results for input(s): HGBA1C in the last 72 hours. CBG: No results  for input(s): GLUCAP in the last 168 hours. Lipid Profile: No results for input(s): CHOL, HDL, LDLCALC, TRIG, CHOLHDL, LDLDIRECT in the last 72 hours. Thyroid Function Tests: No results for input(s): TSH, T4TOTAL, FREET4, T3FREE, THYROIDAB in the last 72 hours. Anemia Panel: No results for input(s): VITAMINB12, FOLATE, FERRITIN, TIBC, IRON, RETICCTPCT in the last 72 hours. Sepsis Labs: No results for input(s): PROCALCITON, LATICACIDVEN in the last 168 hours.  No results found for this or any previous visit (from the past 240 hour(s)).   Radiology Studies: No results found.  Scheduled Meds: . amiodarone  400 mg Oral Daily  . atorvastatin  80 mg Oral q1800  . feeding supplement (ENSURE ENLIVE)  237 mL Oral BID BM  . furosemide  20 mg Oral BID  . guaiFENesin  600 mg Oral BID  . heparin  5,000 Units Subcutaneous Q8H  . ipratropium-albuterol  3 mL Nebulization BID  . iron polysaccharides  150 mg Oral Daily  . isosorbide mononitrate  15 mg Oral Daily  . pantoprazole  40 mg Oral Daily  . sodium chloride flush  3 mL Intravenous Q12H  . tamsulosin  0.4 mg Oral Daily  . traZODone  50 mg Oral QHS   Continuous Infusions: . sodium chloride       LOS: 2 days   Rickey Barbara, MD [facility] Pager On Amion  If 7PM-7AM, please contact night-coverage 11/03/2017, 5:56 PM

## 2017-11-04 DIAGNOSIS — E43 Unspecified severe protein-calorie malnutrition: Secondary | ICD-10-CM

## 2017-11-04 LAB — BPAM RBC
BLOOD PRODUCT EXPIRATION DATE: 201910202359
Blood Product Expiration Date: 201910202359
ISSUE DATE / TIME: 201909260004
UNIT TYPE AND RH: 6200
Unit Type and Rh: 6200

## 2017-11-04 LAB — BASIC METABOLIC PANEL
ANION GAP: 12 (ref 5–15)
BUN: 34 mg/dL — AB (ref 8–23)
CHLORIDE: 98 mmol/L (ref 98–111)
CO2: 28 mmol/L (ref 22–32)
Calcium: 8.3 mg/dL — ABNORMAL LOW (ref 8.9–10.3)
Creatinine, Ser: 2.2 mg/dL — ABNORMAL HIGH (ref 0.61–1.24)
GFR calc Af Amer: 31 mL/min — ABNORMAL LOW (ref >60)
GFR calc non Af Amer: 27 mL/min — ABNORMAL LOW (ref >60)
GLUCOSE: 69 mg/dL — AB (ref 70–99)
Potassium: 3.9 mmol/L (ref 3.5–5.1)
Sodium: 138 mmol/L (ref 135–145)

## 2017-11-04 LAB — TYPE AND SCREEN
ABO/RH(D): AB POS
Antibody Screen: NEGATIVE
Unit division: 0
Unit division: 0

## 2017-11-04 LAB — GLUCOSE, CAPILLARY: Glucose-Capillary: 60 mg/dL — ABNORMAL LOW (ref 70–99)

## 2017-11-04 MED ORDER — HYDROMORPHONE HCL 1 MG/ML IJ SOLN
0.3000 mg | INTRAMUSCULAR | Status: DC | PRN
  Administered 2017-11-04 – 2017-11-05 (×5): 0.5 mg via INTRAVENOUS
  Filled 2017-11-04 (×5): qty 1

## 2017-11-04 NOTE — Consult Note (Signed)
Consultation Note Date: 11/04/2017   Patient Name: Gary [name]  DOB: 1936/01/31  MRN: [id number]  Age / Sex: 82 y.o., male  PCP: Jearld Fenton, NP Referring Physician: Donne Hazel, MD  Reason for Consultation: Establishing goals of care  HPI/Patient Profile: 82 y.o. male  with past medical history of hypertension, hyperlipidemia, CAD, PAF, HFpEF, alcohol abuse, PVD and recent NSTEMI (medical management only) admitted on 10/31/2017 with continued decline since transitioning to rehab at [facility] on 9/11.  Palliative consulted for goals of care.  Clinical Assessment and Goals of Care: I met today with Mr. Savitz and 1 of his daughters, Manuela Schwartz.  Mr. Marro awoke briefly but fell immediately back to sleep and did not participate in conversation.  I then met with his daughter, Manuela Schwartz, and outside the room.  Manuela Schwartz reports to me that Mr. Bloom has 3 children including herself, her daughter ([name]) and a son Charlotte Crumb).  [name] lives locally and is his designated [place].  Manuela Schwartz is not sure if [name] has paperwork for legal power of attorney, however, she and [name] to discuss all decisions to come to a mutual agreement and [name] has been acting and role of signing paperwork on his behalf for the last several years.  Her brother Charlotte Crumb has lived with her father for several years (she describes a situation is more roommates for the purpose of sharing bills) but has CP and while is involved in her father's care, he defers decision-making to his sisters.  Manuela Schwartz reports that Mr. Huish always had a history of alcohol and substance abuse.  He therefore was not part of his children's lives growing up for "decades."  She describes him as a very independent man who finds joy only and being in his own home where he can drink and smoke as he pleases.  He is resigned to the fact that he has been at skilled  nursing facility, however, she reports that he does not really find any joy in living there.  He has been talking with his children about the fact that he is approaching the end of his life.  He told his son that he is not going to ever be able to return home again.  Manuela Schwartz and I discussed the declines he has had in his nutrition, cognition, and functional status over the last several months.  She reports there is been a very sharp decline over the last couple of weeks.  Family recognized the signs that he is approaching end-of-life, however, she and her sister are struggling with how to move forward to focus on quality for him while also working to conserve family resources to ensure that Charlotte Crumb still has the means to survive once her father passes.  She relays a history of a great deal of weight loss over the last several months.  She reports that while he is in the hospital, he has not been eating or drinking more than 1-2 bites at each meal.  I also discussed this with  his bedside RN who confirms that his p.o. intake has been limited to small sips and bites with medications (when he is willing to take it).  I discussed with Manuela Schwartz regarding options moving forward for [name] care.  These included transitioning back to skilled facility for trial of rehab with eventual transitioning to hospice once he is medically maximized and finishes up his rehab days.  We also discussed option of home hospice (he does not have a caregiver that can be there with him as he had been living with her brother who works and has physical handicaps that would limit his ability to provide caregiving role), hospice at long-term care facility (patient has small savings that could be used as resource but when family had inquired about Medicaid in the past were told that it would likely result in losing home where her brother currently lives), or residential hospice (I am still unclear if we are really at a point where he would  qualify, but it appears that his nutrition and hydration is significantly compromised at this point.)  SUMMARY OF RECOMMENDATIONS   - DNR/DNI - Discussed plan with daughter and bedside RN to monitor intake for the next day or two to try to determine his overall trajectory and if he would be a candidate for residential hospice facility.  At this point, I discussed with daughter that we may be looking at transition back to [facility] to complete rehab and then transition to hospice once he is transitioned to long term care. - Plan to meet (hopefully tomorrow) with his other daughter ([name]) who is HCPOA per family report to discuss completion of MOST form and review plan that I discussed with Manuela Schwartz today.  Code Status/Advance Care Planning:  DNR  Psycho-social/Spiritual:   Desire for further Chaplaincy support:no  Additional Recommendations: Education on Hospice  Prognosis:   Unable to determine-certainly appears to me that his prognosis is less than 6 months and he should qualify for hospice services if so desired.  We may actually be approaching a point where we are end of life based upon the fact that his nutrition and hydration is dropped and next to nothing.  We will continue to watch closely over the next day or 2 to see if we can determine where we had in his overall disease trajectory.  His family would have preference for residential hospice placement if he is deemed to be a candidate.  Discharge Planning: Residential hospice versus skilled nursing facility based upon clinical course over the next day or 2      Primary Diagnoses: Present on Admission: . HTN  . Alcohol abuse . Malnutrition of moderate degree ([place]) . Stage 4 chronic kidney disease ([place]) . Anemia . Hypokalemia . Paroxysmal atrial fibrillation (HCC) . CAD- non obstructive CAD by cath 04/19/11   I have reviewed the medical record, interviewed the patient and family, and examined the patient. The following aspects are  pertinent.  Past Medical History:  Diagnosis Date  . Alcohol abuse   . Carotid artery disease ([place])   . COPD (chronic obstructive pulmonary disease) ([place])    emphysematous changes on past CT  . Coronary artery disease 04/27/2011   Low risk Myoview in 2010:Non-obstructive disase, except for small OMB 95% ostial lesion  . Dyslipidemia   . GERD (gastroesophageal reflux disease)   . H/O ETOH abuse   . Hypertension   . Pneumonia   . PVD (peripheral vascular disease) ([place]) 04/27/2011   normal renal & abdominal aortography;  s/p Left Fem-Pop BPG 2004.   Social History   Socioeconomic History  . Marital status: Divorced    Spouse name: Not on file  . Number of children: 3  . Years of education: Not on file  . Highest education level: Not on file  Occupational History  . Occupation: Building control surveyor, pipe fitter-Retired  Social Needs  . Financial resource strain: Not on file  . Food insecurity:    Worry: Not on file    Inability: Not on file  . Transportation needs:    Medical: Not on file    Non-medical: Not on file  Tobacco Use  . Smoking status: Former Smoker    Packs/day: 1.00    Years: 50.00    Pack years: 50.00    Types: Cigarettes    Last attempt to quit: 03/03/1997    Years since quitting: 20.6  . Smokeless tobacco: Former Systems developer    Quit date: 04/27/1998  Substance and Sexual Activity  . Alcohol use: Yes    Alcohol/week: 2.0 standard drinks    Types: 2 Cans of beer per week    Comment: " daily beer generally"  . Drug use: Yes    Types: Marijuana  . Sexual activity: Never  Lifestyle  . Physical activity:    Days per week: Not on file    Minutes per session: Not on file  . Stress: Not on file  Relationships  . Social connections:    Talks on phone: Not on file    Gets together: Not on file    Attends religious service: Not on file    Active member of club or organization: Not on file    Attends meetings of clubs or organizations: Not on file    Relationship status: Not  on file  Other Topics Concern  . Not on file  Social History Narrative  . Not on file   Family History  Problem Relation Age of Onset  . CAD Brother        also stroke  . Hypertension Brother   . Diabetes Brother   . Hypertension Father        also stroke  . Stroke Father   . Kidney cancer Mother   . Cancer Mother        Renal  . Cancer Sister        Lung  . COPD Sister   . Hypertension Sister   . Hypertension Brother         X 2   Scheduled Meds: . amiodarone  400 mg Oral Daily  . atorvastatin  80 mg Oral q1800  . feeding supplement (ENSURE ENLIVE)  237 mL Oral BID BM  . furosemide  20 mg Oral BID  . guaiFENesin  600 mg Oral BID  . heparin  5,000 Units Subcutaneous Q8H  . ipratropium-albuterol  3 mL Nebulization BID  . iron polysaccharides  150 mg Oral Daily  . isosorbide mononitrate  15 mg Oral Daily  . pantoprazole  40 mg Oral Daily  . sodium chloride flush  3 mL Intravenous Q12H  . tamsulosin  0.4 mg Oral Daily  . traZODone  50 mg Oral QHS   Continuous Infusions: . sodium chloride     PRN Meds:.sodium chloride, acetaminophen, benzonatate, fluticasone, ipratropium-albuterol, loratadine, morphine injection, nitroGLYCERIN, ondansetron (ZOFRAN) IV, sodium chloride flush Medications Prior to Admission:  Prior to Admission medications   Medication Sig Start Date End Date Taking? Authorizing Provider  amiodarone (PACERONE) 200 MG tablet Take 400  mg by mouth 2 (two) times daily.   Yes [provider]  atorvastatin (LIPITOR) 80 MG tablet Take 80 mg by mouth daily.   Yes [provider]  clopidogrel (PLAVIX) 75 MG tablet TAKE 1 TABLET BY MOUTH EVERY DAY**PT NEEDS APPT FOR FURTHER REFILLS PER DOCTOR** Patient taking differently: Take 75 mg by mouth daily.  07/16/17  Yes Lorretta Harp, MD  furosemide (LASIX) 40 MG tablet Take 40 mg by mouth 2 (two) times daily.   Yes [provider]  ipratropium-albuterol (DUONEB) 0.5-2.5 (3) MG/3ML SOLN Take  3 mLs by nebulization as directed. Inhale TID and Every 4 Hours PRN for SOB and Wheezing   Yes [provider]  iron polysaccharides (NU-IRON) 150 MG capsule Take 1 capsule (150 mg total) by mouth daily. 10/17/17  Yes Duke, Tami Lin, PA  isosorbide mononitrate (IMDUR) 30 MG 24 hr tablet Take 15 mg by mouth daily.   Yes [provider]  loratadine (CLARITIN) 10 MG tablet TAKE 1 TABLET (10 MG TOTAL) BY MOUTH DAILY. Patient taking differently: Take 10 mg by mouth daily as needed for allergies.  03/20/16  Yes Baity, Coralie Keens, NP  mometasone (NASONEX) 50 MCG/ACT nasal spray Place 2 sprays into the nose daily. Patient taking differently: Place 2 sprays into the nose as needed (allergies).  02/21/16  Yes Jearld Fenton, NP  tamsulosin (FLOMAX) 0.4 MG CAPS capsule Take 1 capsule (0.4 mg total) by mouth daily. 04/18/17  Yes 50, Coralie Keens, NP  traZODone (DESYREL) 50 MG tablet TAKE 1/2 TO 1 TABLET BY MOUTH AT BEDTIME AS NEEDED FOR SLEEP Patient taking differently: Take 50 mg by mouth at bedtime.  10/01/17  Yes Jearld Fenton, NP  amiodarone (PACERONE) 400 MG tablet Take 400 mg twice daily for 14 days. Then decrease to 400 mg daily until seen in follow up. Patient not taking: Reported on 10/31/2017 10/17/17   Ledora Bottcher, PA  nitroGLYCERIN (NITROSTAT) 0.4 MG SL tablet Place 1 tablet (0.4 mg total) under the tongue every 5 (five) minutes x 3 doses as needed for chest pain. 10/17/17   Ledora Bottcher, PA   Allergies  Allergen Reactions  . Contrast Media [Iodinated Diagnostic Agents] Itching  . Diovan [Valsartan] Swelling    angioedema  . Neomycin Hives  . Neosporin [Neomycin-Bacitracin Zn-Polymyx] Hives   Review of Systems Unable to obtain  Physical Exam  General: Frail elderly appearing gentleman lying in bed.  He awakens briefly but immediately falls back to sleep.  Does not really engage with examiner. Heart: Regular rate and rhythm. No murmur appreciated. Lungs:  Decreased air movement, clear Abdomen: Soft, nontender, nondistended, positive bowel sounds.  Ext: No significant edema Skin: Warm and dry  Vital Signs: BP (!) 163/65 (BP Location: Right Arm)   Pulse 71   Temp 98 F (36.7 C) (Oral)   Resp 16   Ht 5' 7.99" (1.727 m)   Wt 53.1 kg   SpO2 100%   BMI 17.80 kg/m  Pain Scale: 0-10   Pain Score: 5    SpO2: SpO2: 100 % O2 Device:SpO2: 100 % O2 Flow Rate: .O2 Flow Rate (L/min): 2 L/min  IO: Intake/output summary:   Intake/Output Summary (Last 24 hours) at 11/04/2017 0817 Last data filed at 11/04/2017 0355 Gross per 24 hour  Intake 290 ml  Output 700 ml  Net -410 ml    LBM: Last BM Date: 11/02/17 Baseline Weight: Weight: 49.7 kg Most recent weight: Weight: 53.1 kg  Palliative Assessment/Data:   Flowsheet Rows     Most Recent Value  Intake Tab  Referral Department  Hospitalist  Unit at Time of Referral  Oncology Unit  Palliative Care Primary Diagnosis  Cardiac  Date Notified  11/02/17  Palliative Care Type  New Palliative care  Reason for referral  Clarify Goals of Care  Date of Admission  10/31/17  Date first seen by Palliative Care  11/03/17  # of days Palliative referral response time  1 Day(s)  # of days IP prior to Palliative referral  2  Clinical Assessment  Palliative Performance Scale Score  30%  Psychosocial & Spiritual Assessment  Palliative Care Outcomes  Patient/Family meeting held?  Yes  Who was at the meeting?  Daughter, Manuela Schwartz      Time In: 1700 Time Out: 1830 Time Total: 90 Greater than 50%  of this time was spent counseling and coordinating care related to the above assessment and plan.  Signed by: Micheline Rough, MD   Please contact Palliative Medicine Team phone at [phone] for questions and concerns.  For individual provider: See Shea Evans

## 2017-11-04 NOTE — Progress Notes (Signed)
PROGRESS NOTE    Gary [name]  FAO:[id number] DOB: 1936-01-03 DOA: 10/31/2017 PCP: Lorre Munroe, NP    Brief Narrative:  82 y.o. male with medical history significant of HTN, HLD, CAD, PAF not on anticoagulation, HFpEF last EF noted to be 55 to 60% with grade 1 DD, alcohol abuse, and PVD; who presents with after hemoglobin was reportedly found to be 6.8.  Patient had been recently hospitalized after being found on the floor with a NSTEMI from 9/4-9/11.  He was admitted to the cardiology service and underwent cardiac cath on 9/6 showing severe stenosis of the diagonal branch of the LAD and moderate calcified stenosis of mid circumflex, ostial LAD, and left main for which medical therapy was recommended.  During the hospitalization patient also went into atrial fibrillation, but was not started on anticoagulation due to history of anemia and frailty.  Patient was discharged to [facility] following the hospitalization.  Since that time patient had been declining he previously was able to walk with a walker and was supposed to be having rehab.  They report he has had decreased appetite and weight loss.  He complains of some shortness of breath, minimally productive cough, mild hemoptysis, and some lower extremity swelling.  They checked chest x-rays last week, and told him he had pneumonia.  They had him on oxygen at the nursing facility until this morning.  Patient denies having any abdominal pain, nausea, vomiting, diarrhea, or dysuria symptoms.  Family notes that he has not been able to drink any alcohol for 3 weeks now.  They also previously had him worked up as he has been losing weight over the last year and no cause of symptoms was noted.  ED Course: Upon admission into the emergency department patient was noted to be afebrile, pulse 66-67, respirations 19-27, blood pressures 99/55 to 104/55, and O2 saturations 97% on room air.  Labs revealed WBC 4.4, hemoglobin 7.3, platelets 134,  potassium 3.1, chloride 99, BUN 30, and creatinine 2.43.  Chest x-ray showing acute CHF with bilateral pulmonary edema and pleural effusions.  Patient was given 40 mg of Lasix IV and ordered to be transfused 1 unit of packed red blood cells.  TRH called to admit  Assessment & Plan:   Principal Problem:   Acute exacerbation of CHF (congestive heart failure) (HCC) Active Problems:   HTN    CAD- non obstructive CAD by cath 04/19/11   Hypokalemia   Alcohol abuse   Malnutrition of moderate degree (HCC)   Stage 4 chronic kidney disease (HCC)   Anemia   Paroxysmal atrial fibrillation (HCC)   CHF (congestive heart failure) (HCC)   Protein-calorie malnutrition, severe  Diastolic congestive heart failure exacerbation:  - Presented with findings worrisome for volume overload, still clinically volume overloaded - IV lasix now stopped secondary to hypotension and possible dehydration -Cardiology following -Pt was recently given gentle IVF bolus secondary to hypotension. BP imrpoved -Patient remains on oral lasix  Anemia of chronic disease: Acute on chronic.   -Initial hemoglobin noted to be 7.3. Previously noted to be 8.4 on 9/18.  Stool guaiacs were noted to be negative.  Given history of cardiac disease: Hemoglobin 8.  Pt was given 1 unit PRBC this admit - Most recent hgb of 9.2. Hemodynamically stable -Plavix was discontinued per Cardiology recommendations  CAD: Patient just recently had NSTEMI and underwent left heart cath on 9/6 for which he was noted to have nonobstructive disease and recommended medical management. -trop stable at  0.06, denies chest pain - Continue isosorbide mononitrate, statin as tolerated -Chest pain free at present  Hypokalemia: Acute.  Initial potassium 3.1 - Low this AM -Replacement given -Recheck bmet in AM  Chronic kidney disease stage IV: Patient creatinine on admission 2.43 with BUN 30.  Baseline creatinine prior to cardiac cath was noted to be 1.7.   Suspect possible hypoperfusion due to congestive heart failure exacerbation and recent contrast as cause of worsening kidney function. - Renal function improved, labs reviewed -Repeat bmet in AM  Weight loss and failure to thrive: Acute on chronic.  -Appreciate input by Palliative Care -Plans for possible return to SNF and transition to hospice afterwards -Discussed with Dr. Neale Burly  Paroxysmal atrial fibrillation: Patient currently in sinus rhythm.  CHA2DS2-VASc score =4.  Not started on anticoagulation due to frailty and issues with anemia. - amiodarone dose decreased per Cardiology -remains rate controlled and stable  Thrombocytopenia: Initial platelet count 131 on admission.  Thrombocytopenia likely related with history of alcoholic abuse. - No evidence of acute blood loss -Medically stable at this time  History of alcohol abuse: Has not had alcohol in 3 weeks now. - Counsel on need of cessation of alcohol use - Stable at present  Essential hypertension - Continue current regimen as tolerated - BP stable at present  Malnutrition - Per staff, tolerated PO intake well this AM  Hyperlipidemia - Continue atorvastatin - Stable at present  DVT prophylaxis: heparin subq Code Status: DNR Family Communication: Pt in room, family at bedside Disposition Plan: Uncertain at this time  Consultants:   Cardiology  Palliative Care  Procedures:     Antimicrobials: Anti-infectives (From admission, onward)   None      Subjective: Complaining of diffuse joint pains, wanting to be repositioned  Objective: Vitals:   11/03/17 1411 11/03/17 2126 11/04/17 0539 11/04/17 1353  BP: (!) 143/61 (!) 145/74 (!) 163/65 (!) 141/53  Pulse: 70 72 71 65  Resp: 16 17 16 16   Temp: 99.3 F (37.4 C) 98.2 F (36.8 C) 98 F (36.7 C) 97.9 F (36.6 C)  TempSrc: Oral Oral Oral Oral  SpO2: 100% 100% 100% 100%  Weight:      Height:        Intake/Output Summary (Last 24 hours) at  11/04/2017 1639 Last data filed at 11/04/2017 0355 Gross per 24 hour  Intake 120 ml  Output 700 ml  Net -580 ml   Filed Weights   11/02/17 0326 11/02/17 0505 11/03/17 0531  Weight: 49.7 kg 49.5 kg 53.1 kg    Examination: General exam: Conversant, in no acute distress Respiratory system: normal chest rise, clear, no audible wheezing Cardiovascular system: regular rhythm, s1-s2 Gastrointestinal system: Nondistended, nontender, pos BS Central nervous system: No seizures, no tremors Extremities: No cyanosis, no joint deformities Skin: No rashes, no pallor Psychiatry: Affect normal // no auditory hallucinations   Data Reviewed: I have personally reviewed following labs and imaging studies  CBC: Recent Labs  Lab 10/31/17 2129 11/01/17 0628 11/02/17 0339  WBC 4.4 6.3 4.7  NEUTROABS 3.5 5.3  --   HGB 7.3* 11.2* 9.2*  HCT 22.1* 33.1* 27.6*  MCV 90.2 87.1 87.6  PLT 131* 151 122*   Basic Metabolic Panel: Recent Labs  Lab 10/31/17 2129 11/01/17 0628 11/02/17 0339 11/03/17 0452 11/04/17 0459  NA 134*  --  137 137 138  K 3.1*  --  3.9 3.4* 3.9  CL 99  --  99 99 98  CO2 27  --  27 30 28   GLUCOSE 96  --  82 86 69*  BUN 30*  --  30* 33* 34*  CREATININE 2.43*  --  2.35* 2.28* 2.20*  CALCIUM 7.8*  --  8.1* 8.2* 8.3*  MG  --  1.6*  --   --   --    GFR: Estimated Creatinine Clearance: 20.1 mL/min (A) (by C-G formula based on SCr of 2.2 mg/dL (H)). Liver Function Tests: Recent Labs  Lab 10/31/17 2129  AST 24  ALT 17  ALKPHOS 38  BILITOT 0.7  PROT 4.7*  ALBUMIN 2.4*   No results for input(s): LIPASE, AMYLASE in the last 168 hours. No results for input(s): AMMONIA in the last 168 hours. Coagulation Profile: Recent Labs  Lab 10/31/17 2129  INR 0.88   Cardiac Enzymes: Recent Labs  Lab 11/01/17 0201 11/01/17 0628 11/01/17 1514  TROPONINI 0.07* 0.06* 0.06*   BNP (last 3 results) No results for input(s): PROBNP in the last 8760 hours. HbA1C: No results for  input(s): HGBA1C in the last 72 hours. CBG: Recent Labs  Lab 11/04/17 0822  GLUCAP 60*   Lipid Profile: No results for input(s): CHOL, HDL, LDLCALC, TRIG, CHOLHDL, LDLDIRECT in the last 72 hours. Thyroid Function Tests: No results for input(s): TSH, T4TOTAL, FREET4, T3FREE, THYROIDAB in the last 72 hours. Anemia Panel: No results for input(s): VITAMINB12, FOLATE, FERRITIN, TIBC, IRON, RETICCTPCT in the last 72 hours. Sepsis Labs: No results for input(s): PROCALCITON, LATICACIDVEN in the last 168 hours.  No results found for this or any previous visit (from the past 240 hour(s)).   Radiology Studies: No results found.  Scheduled Meds: . amiodarone  400 mg Oral Daily  . atorvastatin  80 mg Oral q1800  . feeding supplement (ENSURE ENLIVE)  237 mL Oral BID BM  . furosemide  20 mg Oral BID  . guaiFENesin  600 mg Oral BID  . heparin  5,000 Units Subcutaneous Q8H  . ipratropium-albuterol  3 mL Nebulization BID  . iron polysaccharides  150 mg Oral Daily  . isosorbide mononitrate  15 mg Oral Daily  . pantoprazole  40 mg Oral Daily  . sodium chloride flush  3 mL Intravenous Q12H  . tamsulosin  0.4 mg Oral Daily  . traZODone  50 mg Oral QHS   Continuous Infusions: . sodium chloride       LOS: 3 days   Rickey Barbara, MD [facility] Pager On Amion  If 7PM-7AM, please contact night-coverage 11/04/2017, 4:39 PM

## 2017-11-04 NOTE — Progress Notes (Signed)
Daily Progress Note   Patient Name: Gary [name]       Date: 11/04/2017 DOB: Oct 24, 1935  Age: 82 y.o. MRN#: [id number] Attending Physician: Gary Kief, MD Primary Care Physician: Gary Munroe, NP Admit Date: 10/31/2017  Reason for Consultation/Follow-up: Establishing goals of care  Subjective: I saw and examined Gary [name] this afternoon.  Brother and sister present at the bedside.   He was much more interactive today. Noted to have increased PO intake by family and staff.  Reports continuing to have pain in back and leg.  Reports current medication (morphine) does not really help.  Length of Stay: 3  Current Medications: Scheduled Meds:  . amiodarone  400 mg Oral Daily  . atorvastatin  80 mg Oral q1800  . feeding supplement (ENSURE ENLIVE)  237 mL Oral BID BM  . furosemide  20 mg Oral BID  . guaiFENesin  600 mg Oral BID  . heparin  5,000 Units Subcutaneous Q8H  . ipratropium-albuterol  3 mL Nebulization BID  . iron polysaccharides  150 mg Oral Daily  . isosorbide mononitrate  15 mg Oral Daily  . pantoprazole  40 mg Oral Daily  . sodium chloride flush  3 mL Intravenous Q12H  . tamsulosin  0.4 mg Oral Daily  . traZODone  50 mg Oral QHS    Continuous Infusions: . sodium chloride      PRN Meds: sodium chloride, acetaminophen, benzonatate, fluticasone, HYDROmorphone (DILAUDID) injection, ipratropium-albuterol, loratadine, nitroGLYCERIN, ondansetron (ZOFRAN) IV, sodium chloride flush  Physical Exam         General: Alert, awake, in no acute distress. More interactive.  Frail and chronically ill appearing. HEENT: No bruits, no goiter, no JVD Heart: Regular rate and rhythm. No murmur appreciated. Lungs: Fair air movement, clear Abdomen: Soft, nontender,  nondistended, positive bowel sounds.  Ext: No significant edema Skin: Warm and dry Neuro: Grossly intact, nonfocal.   Vital Signs: BP (!) 142/68 (BP Location: Right Arm)   Pulse 70   Temp 98 F (36.7 C) (Oral)   Resp 17   Ht 5' 7.99" (1.727 m)   Wt 53.1 kg   SpO2 99%   BMI 17.80 kg/m  SpO2: SpO2: 99 % O2 Device: O2 Device: Nasal Cannula O2 Flow Rate: O2 Flow Rate (L/min): 2 L/min  Intake/output summary:  Intake/Output Summary (Last 24 hours) at 11/04/2017 2335 Last data filed at 11/04/2017 1700 Gross per 24 hour  Intake 415 ml  Output 700 ml  Net -285 ml   LBM: Last BM Date: 11/02/17 Baseline Weight: Weight: 49.7 kg Most recent weight: Weight: 53.1 kg       Palliative Assessment/Data:    Flowsheet Rows     Most Recent Value  Intake Tab  Referral Department  Hospitalist  Unit at Time of Referral  Oncology Unit  Palliative Care Primary Diagnosis  Cardiac  Date Notified  11/02/17  Palliative Care Type  New Palliative care  Reason for referral  Clarify Goals of Care  Date of Admission  10/31/17  Date first seen by Palliative Care  11/03/17  # of days Palliative referral response time  1 Day(s)  # of days IP prior to Palliative referral  2  Clinical Assessment  Palliative Performance Scale Score  30%  Psychosocial & Spiritual Assessment  Palliative Care Outcomes  Patient/Family meeting held?  Yes  Who was at the meeting?  Daughter, Gary [name]  Palliative Care Outcomes  Clarified goals of care      Patient Active Problem List   Diagnosis Date Noted  . Protein-calorie malnutrition, severe 11/04/2017  . Acute exacerbation of CHF (congestive heart failure) (HCC) 11/01/2017  . CHF (congestive heart failure) (HCC) 11/01/2017  . Anemia 10/17/2017  . SVT (supraventricular tachycardia) (HCC) 10/17/2017  . Paroxysmal atrial fibrillation (HCC) 10/17/2017  . Stage 4 chronic kidney disease (HCC)   . Pressure injury of skin 10/11/2017  . NSTEMI (non-ST elevated  myocardial infarction) (HCC) 10/10/2017  . Malnutrition of moderate degree (HCC) 09/25/2017  . Hypokalemia 04/18/2015  . Alcohol abuse 04/18/2015  . COPD (chronic obstructive pulmonary disease) (HCC) 04/18/2015  . Insomnia 10/31/2014  . BPH (benign prostatic hyperplasia) 10/31/2014  . CAD- non obstructive CAD by cath 04/19/11 07/17/2013  . Carotid artery disease (HCC) 04/29/2013  . HLD (hyperlipidemia) 06/25/2012  . GERD (gastroesophageal reflux disease) 04/28/2011  . PVD, LFBPG 2004, dopplers show progression of disease bliat 04/27/2011  . HTN  04/27/2011    Palliative Care Assessment & Plan   Patient Profile:  82 y.o. male  with past medical history of hypertension, hyperlipidemia, CAD, PAF, HFpEF, alcohol abuse, PVD and recent NSTEMI (medical management only) admitted on 10/31/2017 with continued decline since transitioning to rehab at [facility] on 9/11.  Palliative consulted for goals of care.  Recommendations/Plan: - DNR/DNI - At this point, I would recommend transition back to [facility] to complete rehab with palliative to follow and then transition to hospice once he is transitioned to long term care. - Attempted to reach daughter (Gary [name]) x 2 today without success. Plan to meet (hopefully tomorrow) with Gary [name] (who is HCPOA per family report) to discuss completion of MOST form and review plan. -With renal impairment, will rotate morphine to dilaudid as needed.  Goals of Care and Additional Recommendations:  Limitations on Scope of Treatment: Avoid Hospitalization  Code Status:    Code Status Orders  (From admission, onward)         Start     Ordered   11/01/17 0005  Do not attempt resuscitation (DNR)  Continuous    Question Answer Comment  In the event of cardiac or respiratory ARREST Do not call a "code blue"   In the event of cardiac or respiratory ARREST Do not perform Intubation, CPR, defibrillation or ACLS   In the event of cardiac or respiratory ARREST Use  medication  by any route, position, wound care, and other measures to relive pain and suffering. May use oxygen, suction and manual treatment of airway obstruction as needed for comfort.      11/01/17 0008        Code Status History    Date Active Date Inactive Code Status Order ID Comments User Context   10/12/2017 1409 10/17/2017 1817 DNR [id number]  Beatriz Stallion., PA-C Inpatient   10/10/2017 2354 10/12/2017 1409 Full Code [id number]  Hulan Fray, MD Inpatient   04/19/2015 0132 04/21/2015 1919 Full Code [id number]  Gery Pray, MD ED   11/17/2013 1539 11/25/2013 1606 Full Code [id number]  Raymond Gurney, PA-C Inpatient   02/25/2012 0424 02/26/2012 1835 Full Code [id number]  Ron Parker, MD ED       Prognosis:   < 6 months  Discharge Planning:  Skilled Nursing Facility for rehab with Palliative care service follow-up most likely  Care plan was discussed with RN  Thank you for allowing the Palliative Medicine Team to assist in the care of this patient.   Total Time 20 Prolonged Time Billed No      Greater than 50%  of this time was spent counseling and coordinating care related to the above assessment and plan.  Romie Minus, MD  Please contact Palliative Medicine Team phone at [phone] for questions and concerns.

## 2017-11-05 DIAGNOSIS — I509 Heart failure, unspecified: Secondary | ICD-10-CM

## 2017-11-05 LAB — BASIC METABOLIC PANEL
Anion gap: 9 (ref 5–15)
BUN: 34 mg/dL — AB (ref 8–23)
CALCIUM: 8 mg/dL — AB (ref 8.9–10.3)
CO2: 31 mmol/L (ref 22–32)
Chloride: 95 mmol/L — ABNORMAL LOW (ref 98–111)
Creatinine, Ser: 2.19 mg/dL — ABNORMAL HIGH (ref 0.61–1.24)
GFR calc Af Amer: 31 mL/min — ABNORMAL LOW (ref >60)
GFR calc non Af Amer: 27 mL/min — ABNORMAL LOW (ref >60)
GLUCOSE: 76 mg/dL (ref 70–99)
POTASSIUM: 4 mmol/L (ref 3.5–5.1)
Sodium: 135 mmol/L (ref 135–145)

## 2017-11-05 MED ORDER — BENZONATATE 100 MG PO CAPS: 100.0000 mg | ORAL_CAPSULE | Freq: Three times a day (TID) | ORAL | 0 refills | Status: AC | PRN

## 2017-11-05 MED ORDER — PANTOPRAZOLE SODIUM 40 MG PO TBEC: 40.0000 mg | DELAYED_RELEASE_TABLET | Freq: Every day | ORAL | 0 refills | Status: AC

## 2017-11-05 MED ORDER — FUROSEMIDE 20 MG PO TABS: 20.0000 mg | ORAL_TABLET | Freq: Two times a day (BID) | ORAL | 0 refills | Status: AC

## 2017-11-05 MED ORDER — HYDROMORPHONE HCL 2 MG PO TABS: 1.0000 mg | ORAL_TABLET | ORAL | 0 refills | Status: AC | PRN

## 2017-11-05 MED ORDER — AMIODARONE HCL 400 MG PO TABS: 400.0000 mg | ORAL_TABLET | Freq: Every day | ORAL | 0 refills | Status: AC

## 2017-11-05 NOTE — Clinical Social Work Placement (Signed)
Pt admitting to [facility] SNF- report [phone]. Admitting with palliative care following, plan to attempt further rehab but family also anticipating that in the foreseeable future may need to transition to long term care and hospice if pt declines. Facility agreeable to plan. [facility] transportation arranged. DC information sent via the HUB    CLINICAL SOCIAL WORK PLACEMENT  NOTE  Date:  11/05/2017  Patient Details  Name: Gary [name] MRN: [id number] Date of Birth: [birth year]  Clinical Social Work is seeking post-discharge placement for this patient at the Skilled  Nursing Facility level of care (*CSW will initial, date and re-position this form in  chart as items are completed):  Yes   Patient/family provided with [place] Clinical Social Work Department's list of facilities offering this level of care within the geographic area requested by the patient (or if unable, by the patient's family).  Yes   Patient/family informed of their freedom to choose among providers that offer the needed level of care, that participate in Medicare, Medicaid or managed care program needed by the patient, have an available bed and are willing to accept the patient.  Yes   Patient/family informed of 's ownership interest in [facility] and [facility], as well as of the fact that they are under no obligation to receive care at these facilities.  PASRR submitted to EDS on       PASRR number received on       Existing PASRR number confirmed on 11/05/17     FL2 transmitted to all facilities in geographic area requested by pt/family on 11/05/17     FL2 transmitted to all facilities within larger geographic area on       Patient informed that his/her managed care company has contracts with or will negotiate with certain facilities, including the following:        Yes   Patient/family informed of bed offers received.  Patient chooses bed at [facility]     Physician  recommends and patient chooses bed at [facility]    Patient to be transferred to [facility] on 11/05/17.  Patient to be transferred to facility by ptar     Patient family notified on 11/05/17 of transfer.  Name of family member notified:  daughter Darl Pikes     PHYSICIAN       Additional Comment:    _______________________________________________ Nelwyn Salisbury, LCSW 11/05/2017, 3:48 PM

## 2017-11-05 NOTE — NC FL2 (Signed)
[place] MEDICAID FL2 LEVEL OF CARE SCREENING TOOL     IDENTIFICATION  Patient Name: Gary [name] Birthdate: 1935/03/16 Sex: male Admission Date (Current Location): 10/31/2017  [facility] [place] [facility] and [place] Number:  Chiropodist and Address:  [facility] [place], [address], [place], [place] [address]      Provider Number: [id number]  Attending Physician Name and Address:  Jerald Kief, MD  Relative Name and Phone Number:  [name] [phone]    Current Level of Care: Hospital Recommended Level of Care: Skilled Nursing Facility Prior Approval Number:    Date Approved/Denied:   PASRR Number: [id number]  Discharge Plan: SNF    Current Diagnoses: Patient Active Problem List   Diagnosis Date Noted  . Protein-calorie malnutrition, severe 11/04/2017  . Acute exacerbation of CHF (congestive heart failure) (HCC) 11/01/2017  . CHF (congestive heart failure) (HCC) 11/01/2017  . Anemia 10/17/2017  . SVT (supraventricular tachycardia) (HCC) 10/17/2017  . Paroxysmal atrial fibrillation (HCC) 10/17/2017  . Stage 4 chronic kidney disease (HCC)   . Pressure injury of skin 10/11/2017  . NSTEMI (non-ST elevated myocardial infarction) (HCC) 10/10/2017  . Malnutrition of moderate degree (HCC) 09/25/2017  . Hypokalemia 04/18/2015  . Alcohol abuse 04/18/2015  . COPD (chronic obstructive pulmonary disease) (HCC) 04/18/2015  . Insomnia 10/31/2014  . BPH (benign prostatic hyperplasia) 10/31/2014  . CAD- non obstructive CAD by cath 04/19/11 07/17/2013  . Carotid artery disease (HCC) 04/29/2013  . HLD (hyperlipidemia) 06/25/2012  . GERD (gastroesophageal reflux disease) 04/28/2011  . PVD, LFBPG 2004, dopplers show progression of disease bliat 04/27/2011  . HTN  04/27/2011    Orientation RESPIRATION BLADDER Height & Weight     Self, Time, Situation, Place  O2(2L) Continent Weight: 115 lb 15.4 oz (52.6 kg) Height:  5' 7.99" (172.7 cm)  BEHAVIORAL  SYMPTOMS/MOOD NEUROLOGICAL BOWEL NUTRITION STATUS      Continent Diet(regular diet)  AMBULATORY STATUS COMMUNICATION OF NEEDS Skin   Extensive Assist Verbally PU Stage and Appropriate Care(stage I pressure injury sacrum, foam dressing)                       Personal Care Assistance Level of Assistance  Bathing, Feeding, Dressing Bathing Assistance: Maximum assistance Feeding assistance: Independent Dressing Assistance: Limited assistance     Functional Limitations Info  Sight, Hearing, Speech Sight Info: Adequate Hearing Info: Adequate Speech Info: Adequate    SPECIAL CARE FACTORS FREQUENCY  PT (By licensed PT), OT (By licensed OT)     PT Frequency: 5x OT Frequency: 5x            Contractures Contractures Info: Not present    Additional Factors Info  Code Status, Allergies Code Status Info: DNR Allergies Info: Contrast Media Iodinated Diagnostic Agents, Diovan Valsartan, Neomycin, Neosporin Neomycin-bacitracin Zn-polymyx           Current Medications (11/05/2017):  This is the current hospital active medication list Current Facility-Administered Medications  Medication Dose Route Frequency Provider Last Rate Last Dose  . 0.9 %  sodium chloride infusion  250 mL Intravenous PRN Madelyn Flavors A, MD      . acetaminophen (TYLENOL) tablet 650 mg  650 mg Oral Q4H PRN Madelyn Flavors A, MD   650 mg at 11/05/17 1249  . amiodarone (PACERONE) tablet 400 mg  400 mg Oral Daily Rollene Rotunda, MD   400 mg at 11/05/17 1126  . benzonatate (TESSALON) capsule 100 mg  100 mg Oral TID PRN  Jerald Kief, MD   100 mg at 11/03/17 1133  . feeding supplement (ENSURE ENLIVE) (ENSURE ENLIVE) liquid 237 mL  237 mL Oral BID BM Smith, Rondell A, MD   237 mL at 11/05/17 1127  . fluticasone (FLONASE) 50 MCG/ACT nasal spray 2 spray  2 spray Each Nare Daily PRN Smith, Rondell A, MD      . furosemide (LASIX) tablet 20 mg  20 mg Oral BID Rollene Rotunda, MD   20 mg at 11/05/17 0934  . heparin  injection 5,000 Units  5,000 Units Subcutaneous Q8H Madelyn Flavors A, MD   5,000 Units at 11/05/17 1408  . HYDROmorphone (DILAUDID) injection 0.3-0.5 mg  0.3-0.5 mg Intravenous Q4H PRN Romie Minus, MD   0.5 mg at 11/05/17 1408  . ipratropium-albuterol (DUONEB) 0.5-2.5 (3) MG/3ML nebulizer solution 3 mL  3 mL Nebulization Q4H PRN Smith, Rondell A, MD      . ipratropium-albuterol (DUONEB) 0.5-2.5 (3) MG/3ML nebulizer solution 3 mL  3 mL Nebulization BID Jerald Kief, MD   3 mL at 11/05/17 0730  . isosorbide mononitrate (IMDUR) 24 hr tablet 15 mg  15 mg Oral Daily Smith, Rondell A, MD   15 mg at 11/05/17 1126  . nitroGLYCERIN (NITROSTAT) SL tablet 0.4 mg  0.4 mg Sublingual Q5 Min x 3 PRN Smith, Rondell A, MD      . ondansetron (ZOFRAN) injection 4 mg  4 mg Intravenous Q6H PRN Smith, Rondell A, MD      . pantoprazole (PROTONIX) EC tablet 40 mg  40 mg Oral Daily Smith, Rondell A, MD   40 mg at 11/05/17 1126  . sodium chloride flush (NS) 0.9 % injection 3 mL  3 mL Intravenous Q12H Smith, Rondell A, MD   3 mL at 11/05/17 1127  . sodium chloride flush (NS) 0.9 % injection 3 mL  3 mL Intravenous PRN Smith, Rondell A, MD      . tamsulosin (FLOMAX) capsule 0.4 mg  0.4 mg Oral Daily Smith, Rondell A, MD   0.4 mg at 11/05/17 1126  . traZODone (DESYREL) tablet 50 mg  50 mg Oral QHS Madelyn Flavors A, MD   50 mg at 11/04/17 2138     Discharge Medications: Please see discharge summary for a list of discharge medications.  Relevant Imaging Results:  Relevant Lab Results:   Additional Information SS# [id number]. Needs palliative care to follow at facility  [facility], LCSW

## 2017-11-05 NOTE — Discharge Summary (Signed)
Physician Discharge Summary  Gary [name]:[id number] DOB: October 20, 1935 DOA: 10/31/2017  PCP: Lorre Munroe, NP  Admit date: 10/31/2017 Discharge date: 11/05/2017  Admitted From: SNF Disposition:  SNF  Recommendations for Outpatient Follow-up:  1. Follow up with PCP in 1-2 weeks 2. Follow up with Cardiology on as needed basis 3. Follow up with hospice after SNF stay  NCCSR reviewed. No recent controlled substances were prescribed  Discharge Condition:Stable CODE STATUS:DNR Diet recommendation: Regular   Brief/Interim Summary: 82 y.o.malewith medical history significant ofHTN, HLD, CAD,PAF not on anticoagulation,HFpEFlast EF noted to be 55 to 60% with grade 1 DD,alcohol abuse, andPVD;who presents with after hemoglobin was reportedly found to be 6.8. Patient had been recently hospitalized after being found on the floor with a NSTEMI from 9/4-9/11. He was admitted to the cardiology service and underwent cardiac cath on 9/6 showing severe stenosis of the diagonal branch of the LAD and moderate calcified stenosis of mid circumflex, ostial LAD, and left main for which medical therapy was recommended. During the hospitalization patient also went into atrial fibrillation, but was not started on anticoagulation due to history of anemia and frailty. Patient was discharged to [facility] pace following the hospitalization. Since that time patient had been declining he previously was able to walk with a walker and was supposed to be having rehab. They report he has had decreased appetite and weight loss. He complains of some shortness of breath, minimally productive cough, mild hemoptysis, and some lower extremity swelling. They checkedchest x-rays last week, and told him he had pneumonia. Theyhad him on oxygen at the nursing facility until this morning. Patient denies having any abdominal pain, nausea, vomiting, diarrhea, or dysuria symptoms. Family notes that he has not been  able to drink any alcohol for 3 weeks now. They also previously had him worked up as he has been losing weight over the last year and no cause of symptoms was noted.  ED Course:Upon admission into the emergency department patient was noted to be afebrile, pulse 66-67, respirations 19-27, blood pressures 99/55 to 104/55, and O2 saturations 97% on room air. Labs revealed WBC 4.4, hemoglobin 7.3, platelets 134, potassium 3.1, chloride 99, BUN 30, and creatinine 2.43. Chest x-ray showing acute CHF with bilateral pulmonary edema and pleural effusions. Patient was given 40 mg of Lasix IV and ordered to be transfused 1 unit of packed red blood cells. TRH called to admit  Diastolic congestive heart failure exacerbation:  - Presented with findings worrisome for volume overload, still clinically volume overloaded - IV lasix now stopped secondary to hypotension and possible dehydration -Cardiology following -Pt was recently given gentle IVF bolus secondary to hypotension. BP imrpoved -Patient remains on oral lasix at time of discharge  Anemia of chronic disease:Acute on chronic.  -Initial hemoglobin noted to be 7.3. Previously noted to be 8.4 on 9/18. Stool guaiacs were noted to be negative. Given history of cardiac disease: Hemoglobin 8. Pt was given 1 unit PRBC this admit -Most recent hgb of 9.2. Hemodynamically stable -Plavix was discontinued per Cardiology recommendations  CAD: Patient justrecently had NSTEMI andunderwent left heart cath on 9/6 for which he was noted to have nonobstructive disease and recommended medical management. -trop stable at 0.06, denies chest pain -Continue isosorbide mononitrate, statin as tolerated -Chest pain free at present  Hypokalemia: Acute. Initial potassium 3.1 -Low this AM -Replacement given  Chronic kidney disease stageIV: Patient creatinine on admission 2.43 with BUN 30.Baseline creatinine prior to cardiac cath was noted to be 1.7.  Suspect possible hypoperfusion due to congestive heart failure exacerbationand recent contrast as cause of worsening kidney function. -Renal function improved, labs reviewed  Weight loss and failure to thrive: Acute on chronic.  -Appreciate input by Palliative Care -Plans for return to SNF and transition to hospice afterwards  Paroxysmal atrial fibrillation: Patient currently in sinus rhythm.CHA2DS2-VASc score =4. Not started on anticoagulation due to frailty and issues with anemia. -amiodarone dose decreased per Cardiology -remains rate controlled and stable  Thrombocytopenia: Initial platelet count 131 on admission. Thrombocytopenia likely related with history of alcoholic abuse. -No evidence of acute blood loss -Medically stable at this time  History of alcohol abuse: Has not had alcohol in 3 weeks now. -Counsel on need of cessation of alcohol use - Stable at present  Essential hypertension -Continue current regimen as tolerated - BP stable at present  Malnutrition -Per staff, tolerated PO intake well this AM  Hyperlipidemia -Continue atorvastatin - Stable at present   Discharge Diagnoses:  Principal Problem:   Acute exacerbation of CHF (congestive heart failure) (HCC) Active Problems:   HTN    CAD- non obstructive CAD by cath 04/19/11   Hypokalemia   Alcohol abuse   Malnutrition of moderate degree (HCC)   Stage 4 chronic kidney disease (HCC)   Anemia   Paroxysmal atrial fibrillation (HCC)   CHF (congestive heart failure) (HCC)   Protein-calorie malnutrition, severe    Discharge Instructions   Allergies as of 11/05/2017      Reactions   Contrast Media [iodinated Diagnostic Agents] Itching   Diovan [valsartan] Swelling   angioedema   Neomycin Hives   Neosporin [neomycin-bacitracin Zn-polymyx] Hives      Medication List    STOP taking these medications   atorvastatin 80 MG tablet Commonly known as:  LIPITOR   clopidogrel 75 MG  tablet Commonly known as:  PLAVIX     TAKE these medications   amiodarone 400 MG tablet Commonly known as:  PACERONE Take 1 tablet (400 mg total) by mouth daily. Start taking on:  11/06/2017 What changed:    medication strength  when to take this  Another medication with the same name was removed. Continue taking this medication, and follow the directions you see here.   benzonatate 100 MG capsule Commonly known as:  TESSALON Take 1 capsule (100 mg total) by mouth 3 (three) times daily as needed for cough.   furosemide 20 MG tablet Commonly known as:  LASIX Take 1 tablet (20 mg total) by mouth 2 (two) times daily. What changed:    medication strength  how much to take   HYDROmorphone 2 MG tablet Commonly known as:  DILAUDID Take 0.5 tablets (1 mg total) by mouth every 4 (four) hours as needed for severe pain.   ipratropium-albuterol 0.5-2.5 (3) MG/3ML Soln Commonly known as:  DUONEB Take 3 mLs by nebulization as directed. Inhale TID and Every 4 Hours PRN for SOB and Wheezing   iron polysaccharides 150 MG capsule Commonly known as:  NIFEREX Take 1 capsule (150 mg total) by mouth daily.   isosorbide mononitrate 30 MG 24 hr tablet Commonly known as:  IMDUR Take 15 mg by mouth daily.   loratadine 10 MG tablet Commonly known as:  CLARITIN TAKE 1 TABLET (10 MG TOTAL) BY MOUTH DAILY. What changed:    when to take this  reasons to take this   mometasone 50 MCG/ACT nasal spray Commonly known as:  NASONEX Place 2 sprays into the nose daily. What changed:  when to take this  reasons to take this   nitroGLYCERIN 0.4 MG SL tablet Commonly known as:  NITROSTAT Place 1 tablet (0.4 mg total) under the tongue every 5 (five) minutes x 3 doses as needed for chest pain.   pantoprazole 40 MG tablet Commonly known as:  PROTONIX Take 1 tablet (40 mg total) by mouth daily. Start taking on:  11/06/2017   tamsulosin 0.4 MG Caps capsule Commonly known as:  FLOMAX Take  1 capsule (0.4 mg total) by mouth daily.   traZODone 50 MG tablet Commonly known as:  DESYREL TAKE 1/2 TO 1 TABLET BY MOUTH AT BEDTIME AS NEEDED FOR SLEEP What changed:    how much to take  when to take this  additional instructions      Contact information for after-discharge care    Destination    [facility] .   Service:  Skilled Nursing Contact information: [address] [place] [address] [phone]             Allergies  Allergen Reactions  . Contrast Media [Iodinated Diagnostic Agents] Itching  . Diovan [Valsartan] Swelling    angioedema  . Neomycin Hives  . Neosporin [Neomycin-Bacitracin Zn-Polymyx] Hives    Consultations:  Cardiology  Palliative Care  Procedures/Studies: Nm Myocar Multi W/spect W/wall Motion / Ef  Result Date: 10/11/2017  There was no ST segment deviation noted during stress.  Findings consistent with prior myocardial infarction with peri-infarct ischemia.  The left ventricular ejection fraction is moderately decreased (30-44%).  1. EF 33%, diffuse hypokinesis. 2. Fixed large, severe basal to apical inferior and inferolateral perfusion defect suggestive of prior infarction. Partially reversible medium-sized, mild mid to apical anteroseptal/anterior perfusion defect suggesting infarction with peri-infarct ischemia. Intermediate risk study.  Moderately decreased systolic function.  Inferior/inferolateral infarction.  Mid to apical anteroseptal relatively mild defect that is partially reversible, may be evidence for infarction with peri-infarct ischemia.   Dg Chest Port 1 View  Result Date: 10/31/2017 CLINICAL DATA:  82 year old nursing home patient with anemia (hemoglobin 6.8). Rales on clinical auscultation. EXAM: PORTABLE CHEST 1 VIEW COMPARISON:  08/24/2017, 04/18/2015 and earlier. FINDINGS: Cardiac silhouette moderately to markedly enlarged, unchanged when allowing for differences in technique. Severe  thoracic and proximal abdominal aortic atherosclerosis and severe atherosclerosis involving the subclavian and axillary arteries. Interval development of interstitial and perihilar airspace pulmonary edema and associated BILATERAL pleural effusions. Fluid in the minor fissure on the RIGHT gives an appearance of a pseudotumor. IMPRESSION: Acute CHF, with stable moderate cardiomegaly and diffuse interstitial and airspace pulmonary edema associated with BILATERAL pleural effusions. Electronically Signed   By: Hulan Saas M.D.   On: 10/31/2017 21:32     Subjective: Complaining of diffuse pain  Discharge Exam: Vitals:   11/05/17 0730 11/05/17 1312  BP:  (!) 123/53  Pulse:  77  Resp:  16  Temp:  98 F (36.7 C)  SpO2: 98% 100%   Vitals:   11/04/17 2143 11/05/17 0500 11/05/17 0730 11/05/17 1312  BP:  134/66  (!) 123/53  Pulse:  70  77  Resp:  16  16  Temp:  98.1 F (36.7 C)  98 F (36.7 C)  TempSrc:  Oral  Oral  SpO2: 99% 100% 98% 100%  Weight:  52.6 kg    Height:        General: Pt is alert, awake, not in acute distress Cardiovascular: RRR, S1/S2 +, no rubs, no gallops Respiratory: CTA bilaterally, no wheezing, no rhonchi Abdominal: Soft,  NT, ND, bowel sounds + Extremities: no edema, no cyanosis   The results of significant diagnostics from this hospitalization (including imaging, microbiology, ancillary and laboratory) are listed below for reference.     Microbiology: No results found for this or any previous visit (from the past 240 hour(s)).   Labs: BNP (last 3 results) No results for input(s): BNP in the last 8760 hours. Basic Metabolic Panel: Recent Labs  Lab 10/31/17 2129 11/01/17 0628 11/02/17 0339 11/03/17 0452 11/04/17 0459 11/05/17 0452  NA 134*  --  137 137 138 135  K 3.1*  --  3.9 3.4* 3.9 4.0  CL 99  --  99 99 98 95*  CO2 27  --  27 30 28 31   GLUCOSE 96  --  82 86 69* 76  BUN 30*  --  30* 33* 34* 34*  CREATININE 2.43*  --  2.35* 2.28* 2.20*  2.19*  CALCIUM 7.8*  --  8.1* 8.2* 8.3* 8.0*  MG  --  1.6*  --   --   --   --    Liver Function Tests: Recent Labs  Lab 10/31/17 2129  AST 24  ALT 17  ALKPHOS 38  BILITOT 0.7  PROT 4.7*  ALBUMIN 2.4*   No results for input(s): LIPASE, AMYLASE in the last 168 hours. No results for input(s): AMMONIA in the last 168 hours. CBC: Recent Labs  Lab 10/31/17 2129 11/01/17 0628 11/02/17 0339  WBC 4.4 6.3 4.7  NEUTROABS 3.5 5.3  --   HGB 7.3* 11.2* 9.2*  HCT 22.1* 33.1* 27.6*  MCV 90.2 87.1 87.6  PLT 131* 151 122*   Cardiac Enzymes: Recent Labs  Lab 11/01/17 0201 11/01/17 0628 11/01/17 1514  TROPONINI 0.07* 0.06* 0.06*   BNP: Invalid input(s): POCBNP CBG: Recent Labs  Lab 11/04/17 0822  GLUCAP 60*   D-Dimer No results for input(s): DDIMER in the last 72 hours. Hgb A1c No results for input(s): HGBA1C in the last 72 hours. Lipid Profile No results for input(s): CHOL, HDL, LDLCALC, TRIG, CHOLHDL, LDLDIRECT in the last 72 hours. Thyroid function studies No results for input(s): TSH, T4TOTAL, T3FREE, THYROIDAB in the last 72 hours.  Invalid input(s): FREET3 Anemia work up No results for input(s): VITAMINB12, FOLATE, FERRITIN, TIBC, IRON, RETICCTPCT in the last 72 hours. Urinalysis    Component Value Date/Time   COLORURINE YELLOW 11/01/2017 0022   APPEARANCEUR CLEAR 11/01/2017 0022   LABSPEC 1.008 11/01/2017 0022   PHURINE 5.0 11/01/2017 0022   GLUCOSEU NEGATIVE 11/01/2017 0022   HGBUR NEGATIVE 11/01/2017 0022   BILIRUBINUR NEGATIVE 11/01/2017 0022   KETONESUR NEGATIVE 11/01/2017 0022   PROTEINUR 100 (A) 11/01/2017 0022   NITRITE NEGATIVE 11/01/2017 0022   LEUKOCYTESUR NEGATIVE 11/01/2017 0022   Sepsis Labs Invalid input(s): PROCALCITONIN,  WBC,  LACTICIDVEN Microbiology No results found for this or any previous visit (from the past 240 hour(s)).  Time spent: 30 min  SIGNED:   Rickey Barbara, MD  [facility] 11/05/2017, 3:06 PM  If 7PM-7AM,  please contact night-coverage

## 2017-11-05 NOTE — Progress Notes (Addendum)
Daily Progress Note   Patient Name: Azaan Leask       Date: 11/05/2017 DOB: 10/22/1935  Age: 82 y.o. MRN#: [id number] Attending Physician: Jerald Kief, MD Primary Care Physician: Lorre Munroe, NP Admit Date: 10/31/2017  Reason for Consultation/Follow-up: Establishing goals of care  Subjective: I saw and examined Mr. Hopfensperger this morning.  Eating breakfast.   He remains interactive today.   Called and discussed with daughter, [name].  Family has been working to determine best venue for care as next step as he wants to complete rehab to regain as much functional status as possible, but then will transition to long term care as he is going to be unable to return to his own home.  Discussed completion of MOST form and consideration for electing his hospice benefits when he reaches point of completing rehab days.  Length of Stay: 4  Current Medications: Scheduled Meds:  . amiodarone  400 mg Oral Daily  . atorvastatin  80 mg Oral q1800  . feeding supplement (ENSURE ENLIVE)  237 mL Oral BID BM  . furosemide  20 mg Oral BID  . guaiFENesin  600 mg Oral BID  . heparin  5,000 Units Subcutaneous Q8H  . ipratropium-albuterol  3 mL Nebulization BID  . iron polysaccharides  150 mg Oral Daily  . isosorbide mononitrate  15 mg Oral Daily  . pantoprazole  40 mg Oral Daily  . sodium chloride flush  3 mL Intravenous Q12H  . tamsulosin  0.4 mg Oral Daily  . traZODone  50 mg Oral QHS    Continuous Infusions: . sodium chloride      PRN Meds: sodium chloride, acetaminophen, benzonatate, fluticasone, HYDROmorphone (DILAUDID) injection, ipratropium-albuterol, loratadine, nitroGLYCERIN, ondansetron (ZOFRAN) IV, sodium chloride flush  Physical Exam         General: Alert, awake, in no  acute distress. More interactive.  Frail and chronically ill appearing. HEENT: No bruits, no goiter, no JVD Heart: Regular rate and rhythm. No murmur appreciated. Lungs: Fair air movement, clear Abdomen: Soft, nontender, nondistended, positive bowel sounds.  Ext: No significant edema Skin: Warm and dry Neuro: Grossly intact, nonfocal.   Vital Signs: BP 134/66 (BP Location: Right Arm)   Pulse 70   Temp 98.1 F (36.7 C) (Oral)   Resp 16   Ht 5' 7.99" (  1.727 m)   Wt 52.6 kg   SpO2 98%   BMI 17.64 kg/m  SpO2: SpO2: 98 % O2 Device: O2 Device: Nasal Cannula O2 Flow Rate: O2 Flow Rate (L/min): 2 L/min  Intake/output summary:   Intake/Output Summary (Last 24 hours) at 11/05/2017 1007 Last data filed at 11/05/2017 8119 Gross per 24 hour  Intake 220 ml  Output 1200 ml  Net -980 ml   LBM: Last BM Date: 11/05/17 Baseline Weight: Weight: 49.7 kg Most recent weight: Weight: 52.6 kg       Palliative Assessment/Data:    Flowsheet Rows     Most Recent Value  Intake Tab  Referral Department  Hospitalist  Unit at Time of Referral  Oncology Unit  Palliative Care Primary Diagnosis  Cardiac  Date Notified  11/02/17  Palliative Care Type  New Palliative care  Reason for referral  Clarify Goals of Care  Date of Admission  10/31/17  Date first seen by Palliative Care  11/03/17  # of days Palliative referral response time  1 Day(s)  # of days IP prior to Palliative referral  2  Clinical Assessment  Palliative Performance Scale Score  30%  Psychosocial & Spiritual Assessment  Palliative Care Outcomes  Patient/Family meeting held?  Yes  Who was at the meeting?  Daughter, Darl Pikes  Palliative Care Outcomes  Clarified goals of care      Patient Active Problem List   Diagnosis Date Noted  . Protein-calorie malnutrition, severe 11/04/2017  . Acute exacerbation of CHF (congestive heart failure) (HCC) 11/01/2017  . CHF (congestive heart failure) (HCC) 11/01/2017  . Anemia 10/17/2017    . SVT (supraventricular tachycardia) (HCC) 10/17/2017  . Paroxysmal atrial fibrillation (HCC) 10/17/2017  . Stage 4 chronic kidney disease (HCC)   . Pressure injury of skin 10/11/2017  . NSTEMI (non-ST elevated myocardial infarction) (HCC) 10/10/2017  . Malnutrition of moderate degree (HCC) 09/25/2017  . Hypokalemia 04/18/2015  . Alcohol abuse 04/18/2015  . COPD (chronic obstructive pulmonary disease) (HCC) 04/18/2015  . Insomnia 10/31/2014  . BPH (benign prostatic hyperplasia) 10/31/2014  . CAD- non obstructive CAD by cath 04/19/11 07/17/2013  . Carotid artery disease (HCC) 04/29/2013  . HLD (hyperlipidemia) 06/25/2012  . GERD (gastroesophageal reflux disease) 04/28/2011  . PVD, LFBPG 2004, dopplers show progression of disease bliat 04/27/2011  . HTN  04/27/2011    Palliative Care Assessment & Plan   Patient Profile:  82 y.o. male  with past medical history of hypertension, hyperlipidemia, CAD, PAF, HFpEF, alcohol abuse, PVD and recent NSTEMI (medical management only) admitted on 10/31/2017 with continued decline since transitioning to rehab at [facility] on 9/11.  Palliative consulted for goals of care.  Recommendations/Plan: - DNR/DNI - Family has been looking at options for placement that will allow him to complete rehab with palliative to follow and then transition to hospice once he is transitioned to long term care.  Family would like to pursue placement at [facility] on discharge.  Discussed with Social Work and appreciate assistance in facilitating this.   Family asked about reducing pill burden as well on discharge as they feel he dislikes taking so many medications. - Will plan to meet with family when they arrive to complete MOST form and review plan. -With renal impairment, would consider discharge with either dilaudid 2mg  PO or oxycodone 5mg  PO as needed for pain.  ADDENDUM:  Completed MOST form with family.  Copy of completed form and Durable DNR on chart.   Goals of  Care  and Additional Recommendations:  Limitations on Scope of Treatment: Avoid Hospitalization  Code Status:    Code Status Orders  (From admission, onward)         Start     Ordered   11/01/17 0005  Do not attempt resuscitation (DNR)  Continuous    Question Answer Comment  In the event of cardiac or respiratory ARREST Do not call a "code blue"   In the event of cardiac or respiratory ARREST Do not perform Intubation, CPR, defibrillation or ACLS   In the event of cardiac or respiratory ARREST Use medication by any route, position, wound care, and other measures to relive pain and suffering. May use oxygen, suction and manual treatment of airway obstruction as needed for comfort.      11/01/17 0008        Code Status History    Date Active Date Inactive Code Status Order ID Comments User Context   10/12/2017 1409 10/17/2017 1817 DNR [id number]  Beatriz Stallion., PA-C Inpatient   10/10/2017 2354 10/12/2017 1409 Full Code [id number]  Hulan Fray, MD Inpatient   04/19/2015 0132 04/21/2015 1919 Full Code [id number]  Gery Pray, MD ED   11/17/2013 1539 11/25/2013 1606 Full Code [id number]  Raymond Gurney, PA-C Inpatient   02/25/2012 0424 02/26/2012 1835 Full Code [id number]  Ron Parker, MD ED       Prognosis:   < 6 months  Discharge Planning:  Skilled Nursing Facility for rehab with Palliative care service follow-up- Family desires [facility] plan was discussed with RN, daughter, Dr. Rhona Leavens  Thank you for allowing the Palliative Medicine Team to assist in the care of this patient.   Total Time 50 Prolonged Time Billed No      Greater than 50%  of this time was spent counseling and coordinating care related to the above assessment and plan.  Romie Minus, MD  Please contact Palliative Medicine Team phone at [phone] for questions and concerns.

## 2017-11-05 NOTE — Progress Notes (Addendum)
Progress Note  Patient Name: Gary [name] Date of Encounter: 11/05/2017  Primary Cardiologist: Nanetta Batty, MD   Subjective   Pt has no complaints, wants to discharge.  Inpatient Medications    Scheduled Meds: . amiodarone  400 mg Oral Daily  . atorvastatin  80 mg Oral q1800  . feeding supplement (ENSURE ENLIVE)  237 mL Oral BID BM  . furosemide  20 mg Oral BID  . guaiFENesin  600 mg Oral BID  . heparin  5,000 Units Subcutaneous Q8H  . ipratropium-albuterol  3 mL Nebulization BID  . iron polysaccharides  150 mg Oral Daily  . isosorbide mononitrate  15 mg Oral Daily  . pantoprazole  40 mg Oral Daily  . sodium chloride flush  3 mL Intravenous Q12H  . tamsulosin  0.4 mg Oral Daily  . traZODone  50 mg Oral QHS   Continuous Infusions: . sodium chloride     PRN Meds: sodium chloride, acetaminophen, benzonatate, fluticasone, HYDROmorphone (DILAUDID) injection, ipratropium-albuterol, loratadine, nitroGLYCERIN, ondansetron (ZOFRAN) IV, sodium chloride flush   Vital Signs    Vitals:   11/04/17 2120 11/04/17 2143 11/05/17 0500 11/05/17 0730  BP: (!) 142/68  134/66   Pulse: 70  70   Resp: 17  16   Temp: 98 F (36.7 C)  98.1 F (36.7 C)   TempSrc: Oral  Oral   SpO2: 100% 99% 100% 98%  Weight:   52.6 kg   Height:        Intake/Output Summary (Last 24 hours) at 11/05/2017 0955 Last data filed at 11/05/2017 1610 Gross per 24 hour  Intake 220 ml  Output 1200 ml  Net -980 ml   Filed Weights   11/02/17 0505 11/03/17 0531 11/05/17 0500  Weight: 49.5 kg 53.1 kg 52.6 kg    Telemetry    N/A - Personally Reviewed  ECG    No new tracings - Personally Reviewed  Physical Exam   GEN: No acute distress.   Neck: No JVD Cardiac: RRR, + systolic murmur Respiratory: Clear to auscultation bilaterally. GI: Soft, nontender, non-distended  MS: trace edema; No deformity. Neuro:  Nonfocal  Psych: Normal affect   Labs    Chemistry Recent Labs  Lab  10/31/17 2129  11/03/17 0452 11/04/17 0459 11/05/17 0452  NA 134*   < > 137 138 135  K 3.1*   < > 3.4* 3.9 4.0  CL 99   < > 99 98 95*  CO2 27   < > 30 28 31   GLUCOSE 96   < > 86 69* 76  BUN 30*   < > 33* 34* 34*  CREATININE 2.43*   < > 2.28* 2.20* 2.19*  CALCIUM 7.8*   < > 8.2* 8.3* 8.0*  PROT 4.7*  --   --   --   --   ALBUMIN 2.4*  --   --   --   --   AST 24  --   --   --   --   ALT 17  --   --   --   --   ALKPHOS 38  --   --   --   --   BILITOT 0.7  --   --   --   --   GFRNONAA 24*   < > 25* 27* 27*  GFRAA 27*   < > 29* 31* 31*  ANIONGAP 8   < > 8 12 9    < > = values in this interval  not displayed.     Hematology Recent Labs  Lab 10/31/17 2129 11/01/17 0628 11/02/17 0339  WBC 4.4 6.3 4.7  RBC 2.45* 3.80* 3.15*  HGB 7.3* 11.2* 9.2*  HCT 22.1* 33.1* 27.6*  MCV 90.2 87.1 87.6  MCH 29.8 29.5 29.2  MCHC 33.0 33.8 33.3  RDW 16.3* 16.5* 17.0*  PLT 131* 151 122*    Cardiac Enzymes Recent Labs  Lab 11/01/17 0201 11/01/17 0628 11/01/17 1514  TROPONINI 0.07* 0.06* 0.06*   No results for input(s): TROPIPOC in the last 168 hours.   BNPNo results for input(s): BNP, PROBNP in the last 168 hours.   DDimer No results for input(s): DDIMER in the last 168 hours.   Radiology    No results found.  Cardiac Studies   Echo 10/12/17: Study Conclusions - Left ventricle: The cavity size was normal. Wall thickness was   increased in a pattern of moderate LVH. Systolic function was   normal. The estimated ejection fraction was in the range of 55%   to 60%. Wall motion was normal; there were no regional wall   motion abnormalities. Doppler parameters are consistent with   abnormal left ventricular relaxation (grade 1 diastolic   dysfunction). - Aortic valve: There was no stenosis. - Mitral valve: Moderately calcified annulus. There was mild   regurgitation. - Left atrium: The atrium was moderately dilated. - Right ventricle: The cavity size was normal. Systolic function    was normal. - Pulmonary arteries: No complete TR doppler jet so unable to   estimate PA systolic pressure. - Inferior vena cava: The vessel was normal in size. The   respirophasic diameter changes were in the normal range (>= 50%),   consistent with normal central venous pressure. - Pericardium, extracardiac: A trivial pericardial effusion was   identified.  Impressions: - Normal LV size with moderate LV hypertrophy. EF 55-60%. Normal RV   size and systolic function. Mild mitral regurgitation.   Left heart cath 10/12/17:  Mid LM to Dist LM lesion is 40% stenosed.  Ost LAD lesion is 50% stenosed.  Prox Cx to Mid Cx lesion is 60% stenosed.  Ost 1st Diag lesion is 30% stenosed.  Ost 2nd Diag lesion is 90% stenosed.   1.  Heavily calcified coronary arteries with mild to moderate calcific stenosis of the mid circumflex, ostial LAD, and left main. 2.  Severe stenosis of the diagonal branch of the LAD. 3.  Moderately elevated LVEDP  Recommendations: Medical therapy.  No targets for PCI.  Patient is not a candidate for surgical revascularization. Majority of CAD is nonobstructive.  Patient Profile     82 y.o. male w/ hx D-CHF, NSTEMI 10/12/2017 w/ non-obs CAD>>med rx, HTN, HLD, CKD IV, ETOH, PAF, no anticoag due to age/frailty, anemia, was admitted 09/25 w/ Hgb 7.3, CHF, Cr 2.43.  Assessment & Plan    1. Acute on chronic diastolic heart failure - echo with grade 1 DD - diuresing on 20 mg PO lasix BID - sCr 2.19 and K 4.0 - overall net negative 3.6L with 1.2 L urine output yesterday - home lasix was 40 mg BID - would continue 20 mg BID at discharge with PRN instructions for additional lasix for weight gain of more than 3 lbs  2. Anemia - per primary - no AC or antiplatelet medication  3. HTN - previously hypotensive and anemic - pressures have been well-controlled today - maintained on imdur 15 mg  4. Acute on chronic kidney disease stage IV - sCr  2.18 - baseline  appears to be 1.7 - continue gentle diuresis - per primary  5. Paroxysmal atrial fibrillation - no anticoagulation given age and frailty - no antiplatelet medication necessary - amiodarone 400 mg daily - decrease to 200 mg daily at discharge  For questions or updates, please contact [facility] Please consult [email or web address] for contact info under     Signed, Marcelino Duster, PA  11/05/2017, 9:55 AM    The patient was seen, examined and discussed with Duane Boston , PA-C and I agree with the above.    The patient is now DNR and being discharged to a Skilled Nursing Facility for rehab with Palliative care service, possibly today. The patient appears euvolemic. We recommend to continue his current medical regimen.   Follow up as needed. We will sign off, please call us with any questions. [facility] will sign off.   Medication Recommendations:  Current medications Other recommendations (labs, testing, etc):  No further testing recommended  Follow up as an outpatient:  As needed.  Tobias Alexander, MD 11/05/2017

## 2017-11-05 NOTE — Progress Notes (Signed)
Occupational Therapy Treatment Patient Details Name: Gary [name] MRN: [id number] DOB: 1936-01-16 Today's Date: 11/05/2017    History of present illness 82 y.o. male with medical history significant of HTN, HLD, CAD, PAF not on anticoagulation, HFpEF last EF noted to be 55 to 60% with grade 1 DD, alcohol abuse, and PVD; who presents with after hemoglobin was reportedly found to be 6.8.    OT comments  Pt declined OOB - even with enxouragement- but did agree to BUE exercise and drinking ensure. Pt able to hold ensure this day with increased independence  Follow Up Recommendations  SNF    Equipment Recommendations  None recommended by OT       Precautions / Restrictions Precautions Precautions: Fall Restrictions Weight Bearing Restrictions: No       Mobility Bed Mobility Overal bed mobility: Needs Assistance(repositioning in bed)                Transfers                          ADL either performed or assessed with clinical judgement   ADL Overall ADL's : Needs assistance/impaired Eating/Feeding: Minimal assistance;Bed level Eating/Feeding Details (indicate cue type and reason): Pt agreed to drink ensure after completing BUE AROM                                         Vision Patient Visual Report: No change from baseline            Cognition Arousal/Alertness: Awake/alert Behavior During Therapy: Flat affect Overall Cognitive Status: Within Functional Limits for tasks assessed                                          Exercises General Exercises - Upper Extremity Shoulder Flexion: AROM;10 reps;Supine;Both Elbow Extension: AROM;10 reps;Both;Supine Wrist Flexion: AROM;10 reps;Both;Supine Wrist Extension: AROM;Both;10 reps;Supine Digit Composite Flexion: AROM;Both;Supine;10 reps Composite Extension: AROM;Both;Supine;10 reps           Pertinent Vitals/ Pain       Pain Assessment: No/denies  pain     Prior Functioning/Environment              Frequency  Min 2X/week        Progress Toward Goals  OT Goals(current goals can now be found in the care plan section)  Progress towards OT goals: OT to reassess next treatment     Plan Discharge plan remains appropriate       AM-PAC PT "6 Clicks" Daily Activity     Outcome Measure   Help from another person eating meals?: A Little Help from another person taking care of personal grooming?: A Little Help from another person toileting, which includes using toliet, bedpan, or urinal?: Total Help from another person bathing (including washing, rinsing, drying)?: A Lot Help from another person to put on and taking off regular upper body clothing?: A Lot Help from another person to put on and taking off regular lower body clothing?: Total 6 Click Score: 12    End of Session    OT Visit Diagnosis: Unsteadiness on feet (R26.81);History of falling (Z91.81);Muscle weakness (generalized) (M62.81);Other abnormalities of gait and mobility (R26.89)   Activity Tolerance Patient limited by fatigue   Patient Left in  bed;with bed alarm set;with family/visitor present   Nurse Communication Mobility status        Time: 5462-7035 OT Time Calculation (min): 15 min  Charges: OT General Charges $OT Visit: 1 Visit OT Treatments $Therapeutic Activity: 8-22 mins  Lise Auer, OT Acute Rehabilitation Services [phone] Office- [phone]      Presleigh Feldstein, Karin Golden D 11/05/2017, 1:35 PM

## 2017-11-05 NOTE — Progress Notes (Signed)
Called and gave report to McGehee at [facility].

## 2017-11-07 DIAGNOSIS — R5381 Other malaise: Secondary | ICD-10-CM | POA: Diagnosis not present

## 2017-11-07 DIAGNOSIS — I10 Essential (primary) hypertension: Secondary | ICD-10-CM | POA: Diagnosis not present

## 2017-11-07 DIAGNOSIS — I25118 Atherosclerotic heart disease of native coronary artery with other forms of angina pectoris: Secondary | ICD-10-CM | POA: Diagnosis not present

## 2017-11-07 DIAGNOSIS — I5032 Chronic diastolic (congestive) heart failure: Secondary | ICD-10-CM | POA: Diagnosis not present

## 2017-11-09 ENCOUNTER — Telehealth: Payer: Self-pay | Admitting: Internal Medicine

## 2017-11-09 NOTE — Telephone Encounter (Signed)
Copied from CRM [id number]. Topic: Quick Communication - See Telephone Encounter >> Nov 09, 2017  8:06 AM Gean Birchwood R wrote: Patients daughter called in and stated he passed away yesterday

## 2017-11-12 NOTE — Telephone Encounter (Signed)
noted 

## 2017-12-07 DEATH — deceased
